# Patient Record
Sex: Female | Born: 1935 | Race: White | Hispanic: No | Smoking: Never smoker
Health system: Southern US, Community
[De-identification: ages and names within clinical notes are randomized; demographics above are authoritative.]

## PROBLEM LIST (undated history)

## (undated) DIAGNOSIS — I10 Essential (primary) hypertension: Secondary | ICD-10-CM

## (undated) DIAGNOSIS — S62109A Fracture of unspecified carpal bone, unspecified wrist, initial encounter for closed fracture: Secondary | ICD-10-CM

## (undated) DIAGNOSIS — B019 Varicella without complication: Secondary | ICD-10-CM

## (undated) DIAGNOSIS — K589 Irritable bowel syndrome without diarrhea: Secondary | ICD-10-CM

## (undated) DIAGNOSIS — D62 Acute posthemorrhagic anemia: Secondary | ICD-10-CM

## (undated) DIAGNOSIS — E1142 Type 2 diabetes mellitus with diabetic polyneuropathy: Secondary | ICD-10-CM

## (undated) DIAGNOSIS — R634 Abnormal weight loss: Secondary | ICD-10-CM

## (undated) DIAGNOSIS — R531 Weakness: Secondary | ICD-10-CM

## (undated) DIAGNOSIS — F329 Major depressive disorder, single episode, unspecified: Secondary | ICD-10-CM

## (undated) DIAGNOSIS — K649 Unspecified hemorrhoids: Secondary | ICD-10-CM

## (undated) DIAGNOSIS — K5900 Constipation, unspecified: Secondary | ICD-10-CM

## (undated) DIAGNOSIS — H5461 Unqualified visual loss, right eye, normal vision left eye: Secondary | ICD-10-CM

## (undated) DIAGNOSIS — R1310 Dysphagia, unspecified: Secondary | ICD-10-CM

## (undated) DIAGNOSIS — I2510 Atherosclerotic heart disease of native coronary artery without angina pectoris: Secondary | ICD-10-CM

## (undated) DIAGNOSIS — L309 Dermatitis, unspecified: Secondary | ICD-10-CM

## (undated) DIAGNOSIS — M948X9 Other specified disorders of cartilage, unspecified sites: Secondary | ICD-10-CM

## (undated) DIAGNOSIS — B952 Enterococcus as the cause of diseases classified elsewhere: Secondary | ICD-10-CM

## (undated) DIAGNOSIS — R42 Dizziness and giddiness: Secondary | ICD-10-CM

## (undated) DIAGNOSIS — S72142A Displaced intertrochanteric fracture of left femur, initial encounter for closed fracture: Secondary | ICD-10-CM

## (undated) DIAGNOSIS — E785 Hyperlipidemia, unspecified: Secondary | ICD-10-CM

## (undated) DIAGNOSIS — E440 Moderate protein-calorie malnutrition: Secondary | ICD-10-CM

## (undated) DIAGNOSIS — R2681 Unsteadiness on feet: Secondary | ICD-10-CM

## (undated) DIAGNOSIS — D649 Anemia, unspecified: Principal | ICD-10-CM

## (undated) DIAGNOSIS — N390 Urinary tract infection, site not specified: Secondary | ICD-10-CM

## (undated) HISTORY — DX: Essential (primary) hypertension: I10

## (undated) HISTORY — DX: Unsteadiness on feet: R26.81

## (undated) HISTORY — DX: Displaced intertrochanteric fracture of left femur, initial encounter for closed fracture: S72.142A

## (undated) HISTORY — DX: Anemia, unspecified: D64.9

## (undated) HISTORY — DX: Moderate protein-calorie malnutrition: E44.0

## (undated) HISTORY — DX: Dermatitis, unspecified: L30.9

## (undated) HISTORY — DX: Dizziness and giddiness: R42

## (undated) HISTORY — DX: Abnormal weight loss: R63.4

## (undated) HISTORY — DX: Constipation, unspecified: K59.00

## (undated) HISTORY — DX: Unspecified hemorrhoids: K64.9

## (undated) HISTORY — DX: Hyperlipidemia, unspecified: E78.5

## (undated) HISTORY — DX: Atherosclerotic heart disease of native coronary artery without angina pectoris: I25.10

## (undated) HISTORY — DX: Dysphagia, unspecified: R13.10

## (undated) HISTORY — DX: Weakness: R53.1

## (undated) HISTORY — DX: Acute posthemorrhagic anemia: D62

## (undated) HISTORY — DX: Urinary tract infection, site not specified: N39.0

## (undated) HISTORY — DX: Varicella without complication: B01.9

## (undated) HISTORY — DX: Major depressive disorder, single episode, unspecified: F32.9

## (undated) HISTORY — DX: Irritable bowel syndrome, unspecified: K58.9

## (undated) HISTORY — DX: Unqualified visual loss, right eye, normal vision left eye: H54.61

## (undated) HISTORY — DX: Urinary tract infection, site not specified: B95.2

## (undated) HISTORY — DX: Fracture of unspecified carpal bone, unspecified wrist, initial encounter for closed fracture: S62.109A

## (undated) HISTORY — DX: Other specified disorders of cartilage, unspecified sites: M94.8X9

---

## 1968-05-28 DIAGNOSIS — E785 Hyperlipidemia, unspecified: Secondary | ICD-10-CM

## 1968-05-28 DIAGNOSIS — I10 Essential (primary) hypertension: Secondary | ICD-10-CM

## 1968-05-28 HISTORY — DX: Hyperlipidemia, unspecified: E78.5

## 1968-05-28 HISTORY — DX: Essential (primary) hypertension: I10

## 1977-05-28 HISTORY — PX: HEMORROIDECTOMY: SUR656

## 2000-05-28 HISTORY — PX: OTHER SURGICAL HISTORY: SHX169

## 2002-05-28 HISTORY — PX: ROTATOR CUFF REPAIR: SHX139

## 2004-05-28 HISTORY — PX: OTHER SURGICAL HISTORY: SHX169

## 2006-05-28 HISTORY — PX: ABDOMINAL HYSTERECTOMY: SHX81

## 2006-05-28 LAB — HM COLONOSCOPY

## 2008-05-28 DIAGNOSIS — R42 Dizziness and giddiness: Secondary | ICD-10-CM

## 2008-05-28 HISTORY — PX: PACEMAKER INSERTION: SHX728

## 2008-05-28 HISTORY — DX: Dizziness and giddiness: R42

## 2009-09-07 LAB — ICD DEVICE OBSERVATION

## 2010-12-27 LAB — HM MAMMOGRAPHY

## 2011-04-18 ENCOUNTER — Ambulatory Visit: Payer: Self-pay | Admitting: Internal Medicine

## 2011-07-02 ENCOUNTER — Ambulatory Visit (INDEPENDENT_AMBULATORY_CARE_PROVIDER_SITE_OTHER): Payer: Medicare Other | Admitting: Family Medicine

## 2011-07-02 ENCOUNTER — Encounter: Payer: Self-pay | Admitting: Family Medicine

## 2011-07-02 DIAGNOSIS — Z23 Encounter for immunization: Secondary | ICD-10-CM | POA: Diagnosis not present

## 2011-07-02 DIAGNOSIS — R42 Dizziness and giddiness: Secondary | ICD-10-CM

## 2011-07-02 DIAGNOSIS — E119 Type 2 diabetes mellitus without complications: Secondary | ICD-10-CM | POA: Diagnosis not present

## 2011-07-02 DIAGNOSIS — H5461 Unqualified visual loss, right eye, normal vision left eye: Secondary | ICD-10-CM | POA: Insufficient documentation

## 2011-07-02 DIAGNOSIS — H8110 Benign paroxysmal vertigo, unspecified ear: Secondary | ICD-10-CM | POA: Insufficient documentation

## 2011-07-02 DIAGNOSIS — E785 Hyperlipidemia, unspecified: Secondary | ICD-10-CM

## 2011-07-02 DIAGNOSIS — Z0000 Encounter for general adult medical examination without abnormal findings: Secondary | ICD-10-CM

## 2011-07-02 DIAGNOSIS — K589 Irritable bowel syndrome without diarrhea: Secondary | ICD-10-CM

## 2011-07-02 DIAGNOSIS — M948X9 Other specified disorders of cartilage, unspecified sites: Secondary | ICD-10-CM

## 2011-07-02 DIAGNOSIS — M810 Age-related osteoporosis without current pathological fracture: Secondary | ICD-10-CM | POA: Insufficient documentation

## 2011-07-02 DIAGNOSIS — I10 Essential (primary) hypertension: Secondary | ICD-10-CM

## 2011-07-02 DIAGNOSIS — I2510 Atherosclerotic heart disease of native coronary artery without angina pectoris: Secondary | ICD-10-CM

## 2011-07-02 DIAGNOSIS — R109 Unspecified abdominal pain: Secondary | ICD-10-CM

## 2011-07-02 DIAGNOSIS — K649 Unspecified hemorrhoids: Secondary | ICD-10-CM

## 2011-07-02 HISTORY — DX: Unspecified hemorrhoids: K64.9

## 2011-07-02 HISTORY — DX: Atherosclerotic heart disease of native coronary artery without angina pectoris: I25.10

## 2011-07-02 LAB — HEPATIC FUNCTION PANEL
ALT: 11 U/L (ref 0–35)
Albumin: 3.8 g/dL (ref 3.5–5.2)
Alkaline Phosphatase: 63 U/L (ref 39–117)
Bilirubin, Direct: 0.1 mg/dL (ref 0.0–0.3)
Total Protein: 6.7 g/dL (ref 6.0–8.3)

## 2011-07-02 LAB — CBC
MCHC: 33.2 g/dL (ref 30.0–36.0)
MCV: 87.4 fl (ref 78.0–100.0)
Platelets: 316 10*3/uL (ref 150.0–400.0)

## 2011-07-02 LAB — RENAL FUNCTION PANEL
Albumin: 3.8 g/dL (ref 3.5–5.2)
BUN: 21 mg/dL (ref 6–23)
Chloride: 105 mEq/L (ref 96–112)
Creatinine, Ser: 1.3 mg/dL — ABNORMAL HIGH (ref 0.4–1.2)
GFR: 43.5 mL/min — ABNORMAL LOW (ref >60.00)
Glucose, Bld: 96 mg/dL (ref 70–99)
Phosphorus: 3.3 mg/dL (ref 2.3–4.6)

## 2011-07-02 LAB — POCT URINALYSIS DIPSTICK
Bilirubin, UA: NEGATIVE
Ketones, UA: NEGATIVE
Nitrite, UA: NEGATIVE
Spec Grav, UA: 1.01
pH, UA: 5.5

## 2011-07-02 LAB — HEMOGLOBIN A1C: Hgb A1c MFr Bld: 6.5 % (ref 4.6–6.5)

## 2011-07-02 LAB — LIPID PANEL
Cholesterol: 202 mg/dL — ABNORMAL HIGH (ref 0–200)
VLDL: 25 mg/dL (ref 0.0–40.0)

## 2011-07-02 NOTE — Assessment & Plan Note (Signed)
Patient with a pacemaker but recently relocated to the area, neither she nor her daughter are aware of why she received it, will check old records from PMD and cardiology in [place] to further evaluate and refer her to cardiology for ongoing management of her pace maker.

## 2011-07-02 NOTE — Assessment & Plan Note (Signed)
Has had trouble with her right knee in particular but is not having any significant discomfort at this time

## 2011-07-02 NOTE — Progress Notes (Signed)
Patient ID: Briana Thornton, female   DOB: 1926/12/14, 78 y.o.   MRN: [id number] Briana Thornton [id number] Oct 03, 1926 07/02/2011      Progress Note New Patient  Subjective  Chief Complaint  Chief Complaint  Patient presents with  . Establish Care    new patient    HPI  Patient is an 78 year old Caucasian female who is in today with her daughter after relocating from [place]. She is recently widowed and is struggling with some grieving but denies being overwhelmed by her depression or anxiety. Overall she is in a good state of health. She had no recent febrile illness, chest pain, palpitations, shortness of breath, GI or GU complaints. She has not been checking her blood sugars as her glucometer is not working but denies any polyuria or polydipsia. She had some bad vertigo years ago is having no difficulty with that at the present time.  Past Medical History  Diagnosis Date  . Chicken pox as a child  . Measles as a child  . IBS (irritable bowel syndrome)   . Diabetes mellitus 47    type 2  . Hyperlipidemia 72  . Hypertension 72  . Vision loss of right eye   . Vertigo 2010    benign  . Calcification of cartilage     ear  . Cardiovascular system disease 07/02/2011  . Hemorrhoid 07/02/2011  . Osteoporosis 07/02/2011    Past Surgical History  Procedure Date  . Hemorroidectomy 1979  . Knee scoped 2002    right  . Rotator cuff repair 2004    right  . Abdominal hysterectomy 2008    partial still has ovaries  . Lens implant left 2006  . Pacemaker insertion 2010    Family History  Problem Relation Age of Onset  . Diabetes Mother     type 2  . Hypertension Mother   . Cancer Brother     lung-smoker  . Diabetes Daughter     pre diabetic  . Diabetes Son     type 2  . Cancer Maternal Grandfather     prostate    History   Social History  . Marital Status: Widowed    Spouse Name: N/A    Number of Children: N/A  . Years of Education: N/A   Occupational History  . Not on  file.   Social History Main Topics  . Smoking status: Never Smoker   . Smokeless tobacco: Never Used  . Alcohol Use: No  . Drug Use: No  . Sexually Active: Not on file   Other Topics Concern  . Not on file   Social History Narrative  . No narrative on file    No current outpatient prescriptions on file prior to visit.    No Known Allergies  Review of Systems  Review of Systems  Constitutional: Negative for fever, chills and malaise/fatigue.  HENT: Negative for hearing loss, nosebleeds and congestion.   Eyes: Negative for discharge.  Respiratory: Negative for cough, sputum production, shortness of breath and wheezing.   Cardiovascular: Negative for chest pain, palpitations and leg swelling.  Gastrointestinal: Negative for heartburn, nausea, vomiting, abdominal pain, diarrhea, constipation and blood in stool.  Genitourinary: Negative for dysuria, urgency, frequency and hematuria.  Musculoskeletal: Negative for myalgias, back pain and falls.  Skin: Negative for rash.  Neurological: Negative for dizziness, tremors, sensory change, focal weakness, loss of consciousness, weakness and headaches.  Endo/Heme/Allergies: Negative for polydipsia. Does not bruise/bleed easily.  Psychiatric/Behavioral: Negative for depression and suicidal ideas.  The patient is not nervous/anxious and does not have insomnia.        Grieving the loss of her husband of many years in March of 2012.    Objective  BP 141/81  Pulse 74  Temp(Src) 97.8 F (36.6 C) (Temporal)  Ht 5' 0.25" (1.53 m)  Wt 135 lb (61.236 kg)  BMI 26.15 kg/m2  SpO2 97%  Physical Exam  Physical Exam  Constitutional: She is oriented to person, place, and time and well-developed, well-nourished, and in no distress. No distress.  HENT:  Head: Normocephalic and atraumatic.  Right Ear: External ear normal.  Left Ear: External ear normal.  Nose: Nose normal.  Mouth/Throat: Oropharynx is clear and moist. No oropharyngeal  exudate.  Eyes: Conjunctivae are normal. Pupils are equal, round, and reactive to light. Right eye exhibits no discharge. Left eye exhibits no discharge. No scleral icterus.  Neck: Normal range of motion. Neck supple. No thyromegaly present.  Cardiovascular: Normal rate, regular rhythm, normal heart sounds and intact distal pulses.   No murmur heard. Pulmonary/Chest: Effort normal and breath sounds normal. No respiratory distress. She has no wheezes. She has no rales.  Abdominal: Soft. Bowel sounds are normal. She exhibits no distension and no mass. There is no tenderness.  Musculoskeletal: Normal range of motion. She exhibits no edema and no tenderness.  Lymphadenopathy:    She has no cervical adenopathy.  Neurological: She is alert and oriented to person, place, and time. She has normal reflexes. No cranial nerve deficit. Coordination normal.  Skin: Skin is warm and dry. No rash noted. She is not diaphoretic.       Scaly patch with erythematous base on right side of neck c/w recenttly removed seborrhea which patient acknowledges picking at  Psychiatric: Mood, memory and affect normal.       Assessment & Plan  Diabetes mellitus In need of a new glucometer today, Freestyle Lite, should check her blood fasting daily and prn as needed for any symptoms. Avoid simple carbs, continue Metformin bid for now, check hgba1c, microalbumin  IBS (irritable bowel syndrome) Long history of intermittent symptoms, generally well controlled on current diet. Encouraged a daily probiotic  Calcification of cartilage Has had trouble with her right knee in particular but is not having any significant discomfort at this time  Vertigo Had one bad patch and it has not recurred. She ultimately required PT to learn the maneuvers to control it. Encouraged good hydration and report any concerning symptoms  Hyperlipidemia Has been out of her Atorvastatin for a month now, will check fasting labs today to further  evaluate before represcribing this medication, avoid trans fats  Hypertension Mildly elevated, no change in medications  Cardiovascular system disease Patient with a pacemaker but recently relocated to the area, neither she nor her daughter are aware of why she received it, will check old records from PMD and cardiology in [place] to further evaluate and refer her to cardiology for ongoing management of her pace maker.  Osteoporosis Continue calcium and vitamin D supplementation, request old records to review bone density, she reports she has been advised to start Fosamax in the past but chose not to.   Hemorrhoid Have recurred but she finds them tolerable. No change in therapy at this time     Tdap given today

## 2011-07-02 NOTE — Assessment & Plan Note (Signed)
Have recurred but she finds them tolerable. No change in therapy at this time

## 2011-07-02 NOTE — Assessment & Plan Note (Signed)
In need of a new glucometer today, Freestyle Lite, should check her blood fasting daily and prn as needed for any symptoms. Avoid simple carbs, continue Metformin bid for now, check hgba1c, microalbumin

## 2011-07-02 NOTE — Assessment & Plan Note (Signed)
Had one bad patch and it has not recurred. She ultimately required PT to learn the maneuvers to control it. Encouraged good hydration and report any concerning symptoms

## 2011-07-02 NOTE — Assessment & Plan Note (Signed)
Has been out of her Atorvastatin for a month now, will check fasting labs today to further evaluate before represcribing this medication, avoid trans fats

## 2011-07-02 NOTE — Patient Instructions (Signed)

## 2011-07-02 NOTE — Assessment & Plan Note (Signed)
Continue calcium and vitamin D supplementation, request old records to review bone density, she reports she has been advised to start Fosamax in the past but chose not to.

## 2011-07-02 NOTE — Assessment & Plan Note (Signed)
Long history of intermittent symptoms, generally well controlled on current diet. Encouraged a daily probiotic

## 2011-07-02 NOTE — Assessment & Plan Note (Signed)
Mildly elevated, no change in medications

## 2011-07-03 LAB — MICROALBUMIN, URINE: Microalb, Ur: 0.5 mg/dL (ref 0.00–1.89)

## 2011-07-03 MED ORDER — ATORVASTATIN CALCIUM 10 MG PO TABS: 10.0000 mg | ORAL_TABLET | Freq: Every day | ORAL | Status: DC

## 2011-07-03 MED ORDER — METFORMIN HCL ER 500 MG PO TB24: 500.0000 mg | ORAL_TABLET | Freq: Every day | ORAL | Status: DC

## 2011-07-31 ENCOUNTER — Ambulatory Visit (INDEPENDENT_AMBULATORY_CARE_PROVIDER_SITE_OTHER): Payer: Medicare Other | Admitting: Family Medicine

## 2011-07-31 ENCOUNTER — Encounter: Payer: Self-pay | Admitting: Family Medicine

## 2011-07-31 DIAGNOSIS — I10 Essential (primary) hypertension: Secondary | ICD-10-CM | POA: Diagnosis not present

## 2011-07-31 DIAGNOSIS — Z950 Presence of cardiac pacemaker: Secondary | ICD-10-CM

## 2011-07-31 DIAGNOSIS — R634 Abnormal weight loss: Secondary | ICD-10-CM

## 2011-07-31 DIAGNOSIS — E785 Hyperlipidemia, unspecified: Secondary | ICD-10-CM

## 2011-07-31 DIAGNOSIS — E119 Type 2 diabetes mellitus without complications: Secondary | ICD-10-CM

## 2011-07-31 DIAGNOSIS — I2510 Atherosclerotic heart disease of native coronary artery without angina pectoris: Secondary | ICD-10-CM

## 2011-07-31 HISTORY — DX: Abnormal weight loss: R63.4

## 2011-07-31 NOTE — Patient Instructions (Signed)

## 2011-07-31 NOTE — Assessment & Plan Note (Signed)
Fasting sugar 104 this am, this is not an unusual number. No change in meds

## 2011-07-31 NOTE — Assessment & Plan Note (Signed)
Adequately controlled, no change to meds today

## 2011-07-31 NOTE — Assessment & Plan Note (Signed)
Tolerating Atorvastatin and takes it in am. Had missed some doses prior to last visit. They agree to try to take it regularly and we will reevaluate at next visit.

## 2011-07-31 NOTE — Assessment & Plan Note (Signed)
Very slight since last visit. Encouraged increased protein, po intake and fluids, likely multifactorial but partially caused by grief, will continue to monitor

## 2011-07-31 NOTE — Assessment & Plan Note (Signed)
Patient with long cardiac history and a pacemaker in place. We have received old records and will now refer her to cardiology for further management of her cardiac conditions.

## 2011-07-31 NOTE — Progress Notes (Signed)
Patient ID: Briana Thornton, female   DOB: 1927-03-30, 78 y.o.   MRN: [id number] Briana Thornton [id number] 01-01-1927 07/31/2011      Progress Note-Follow Up  Subjective  Chief Complaint  Chief Complaint  Patient presents with  . Follow-up    to include foot exam    HPI  Patient is a 78 year old Caucasian female in today with her daughter for followup. She has generally been doing well and trying to adjust to her move to [place] from [place]. She was widowed a year ago this Friday technologist still feeling blue and sad about this. She has had some episodes of feeling somewhat woozy and dizzy when she turns her head quickly from a holds her gaze to one side or the other for too long. She has not had any severe episodes but does have a history of vertigo for this always makes her nervous. She does note a slow worsening of her vision and has had a lens replacement left eye. She is not established an eye doctor yet to do so. Since her last visit she's not had any acute illness, chest pain palpitations, shortness of breath, GI or GU complaints. Her records have arrived from [place]. She continues to show with a low appetite and acknowledges that depression may play a part, she does not drink adequate fluids each day  Past Medical History  Diagnosis Date  . Chicken pox as a child  . Measles as a child  . IBS (irritable bowel syndrome)   . Diabetes mellitus 78    type 2  . Hyperlipidemia 78  . Hypertension 78  . Vision loss of right eye   . Vertigo 2010    benign  . Calcification of cartilage     ear  . Cardiovascular system disease 78/08/2011  . Hemorrhoid 78/08/2011  . Osteoporosis 78/08/2011    Past Surgical History  Procedure Date  . Hemorroidectomy 1979  . Knee scoped 2002    right  . Rotator cuff repair 2004    right  . Abdominal hysterectomy 2008    partial still has ovaries  . Lens implant left 2006  . Pacemaker insertion 2010    Family History  Problem Relation Age of  Onset  . Diabetes Mother     type 2  . Hypertension Mother   . Cancer Brother     lung-smoker  . Diabetes Daughter     pre diabetic  . Diabetes Son     type 2  . Cancer Maternal Grandfather     prostate    History   Social History  . Marital Status: Widowed    Spouse Name: N/A    Number of Children: N/A  . Years of Education: N/A   Occupational History  . Not on file.   Social History Main Topics  . Smoking status: Never Smoker   . Smokeless tobacco: Never Used  . Alcohol Use: No  . Drug Use: No  . Sexually Active: Not on file   Other Topics Concern  . Not on file   Social History Narrative  . No narrative on file    Current Outpatient Prescriptions on File Prior to Visit  Medication Sig Dispense Refill  . atorvastatin (LIPITOR) 10 MG tablet Take 1 tablet (10 mg total) by mouth daily.  90 tablet  3  . metFORMIN (GLUCOPHAGE-XR) 500 MG 24 hr tablet Take 1 tablet (500 mg total) by mouth daily with breakfast.  90 tablet  3  . valsartan-hydrochlorothiazide (DIOVAN-HCT)  160-12.5 MG per tablet Take 1 tablet by mouth daily.        No Known Allergies  Review of Systems  Review of Systems  Constitutional: Negative for fever and malaise/fatigue.  HENT: Negative for congestion.   Eyes: Negative for discharge.  Respiratory: Negative for shortness of breath.   Cardiovascular: Negative for chest pain, palpitations and leg swelling.  Gastrointestinal: Negative for nausea, abdominal pain and diarrhea.  Genitourinary: Negative for dysuria.  Musculoskeletal: Negative for falls.  Skin: Negative for rash.  Neurological: Positive for dizziness. Negative for loss of consciousness and headaches.  Endo/Heme/Allergies: Negative for polydipsia.  Psychiatric/Behavioral: Negative for depression and suicidal ideas. The patient is not nervous/anxious and does not have insomnia.     Objective  BP 118/62  Pulse 72  Temp(Src) 98.6 F (37 C) (Temporal)  Ht 5' 0.25" (1.53 m)  Wt  134 lb 6.4 oz (60.963 kg)  BMI 26.03 kg/m2  SpO2 97%  Physical Exam Physical Exam  Constitutional: She is oriented to person, place, and time and well-developed, well-nourished, and in no distress. No distress.  HENT:  Head: Normocephalic and atraumatic.  Eyes: Conjunctivae are normal.  Neck: Neck supple. No thyromegaly present.  Cardiovascular: Normal rate, regular rhythm and normal heart sounds.   No murmur heard. Pulmonary/Chest: Effort normal and breath sounds normal. She has no wheezes.  Abdominal: She exhibits no distension and no mass.  Musculoskeletal: She exhibits no edema.  Lymphadenopathy:    She has no cervical adenopathy.  Neurological: She is alert and oriented to person, place, and time.  Skin: Skin is warm and dry. No rash noted. She is not diaphoretic.  Psychiatric: Memory, affect and judgment normal.    Lab Results  Component Value Date   TSH 1.55 07/02/2011   Lab Results  Component Value Date   WBC 7.6 07/02/2011   HGB 11.9* 07/02/2011   HCT 35.7* 07/02/2011   MCV 87.4 07/02/2011   PLT 316.0 07/02/2011   Lab Results  Component Value Date   CREATININE 1.3* 07/02/2011   BUN 21 07/02/2011   NA 141 07/02/2011   K 3.8 07/02/2011   CL 105 07/02/2011   CO2 29 07/02/2011   Lab Results  Component Value Date   ALT 11 07/02/2011   AST 18 07/02/2011   ALKPHOS 63 07/02/2011   BILITOT 0.9 07/02/2011   Lab Results  Component Value Date   CHOL 202* 07/02/2011   Lab Results  Component Value Date   HDL 52.90 07/02/2011   No results found for this basename: [facility]   Lab Results  Component Value Date   TRIG 125.0 07/02/2011   Lab Results  Component Value Date   CHOLHDL 4 07/02/2011     Assessment & Plan  Cardiovascular system disease Patient with long cardiac history and a pacemaker in place. We have received old records and will now refer her to cardiology for further management of her cardiac conditions.  Diabetes mellitus Fasting sugar 104 this am, this is not an unusual  number. No change in meds  Hypertension Adequately controlled, no change to meds today  Hyperlipidemia Tolerating Atorvastatin and takes it in am. Had missed some doses prior to last visit. They agree to try to take it regularly and we will reevaluate at next visit.

## 2011-08-17 ENCOUNTER — Other Ambulatory Visit: Payer: Self-pay | Admitting: *Deleted

## 2011-08-17 DIAGNOSIS — I10 Essential (primary) hypertension: Secondary | ICD-10-CM

## 2011-08-17 MED ORDER — VALSARTAN-HYDROCHLOROTHIAZIDE 160-12.5 MG PO TABS: 1.0000 | ORAL_TABLET | Freq: Every day | ORAL | Status: DC

## 2011-08-17 NOTE — Telephone Encounter (Signed)
Pt daughter left vm stating that pt medication refill needed to be sent to [facility] for Diovan HCTZ and metformin, noted last OV 07-31-11, spoke to daughter Gershon Crane to advised that the Metformin was sent on 07-03-11 with 3 refills, pt daughter advised that was correct and she forgot they picked that up but only needs to have the refill for the Diovan HCT, advised that the medication is sent to [facility] with a 90 day supply, pt daughter understood. rx sent to pharmacy by e-script

## 2011-08-20 ENCOUNTER — Other Ambulatory Visit: Payer: Self-pay | Admitting: Family Medicine

## 2011-08-21 NOTE — Telephone Encounter (Addendum)
Pt needs Diovan, HCTZ, and Metformin [facility] in [place]. Last seen 07/31/11.  RX for Metformin called in on 07/03/11.  Diovan called on 3/22 by Tresa Moore.  [facility], they have received both for 90 day supply and pt picked up partial fill of Diovan and will pick up remainder when available from pharmacy. No further action to be taken.

## 2011-08-31 ENCOUNTER — Encounter: Payer: Self-pay | Admitting: Family Medicine

## 2011-08-31 ENCOUNTER — Ambulatory Visit (INDEPENDENT_AMBULATORY_CARE_PROVIDER_SITE_OTHER): Payer: Medicare Other | Admitting: Family Medicine

## 2011-08-31 VITALS — BP 110/71 | HR 74 | Temp 99.4°F | Ht 60.25 in | Wt 133.0 lb

## 2011-08-31 DIAGNOSIS — J069 Acute upper respiratory infection, unspecified: Secondary | ICD-10-CM | POA: Insufficient documentation

## 2011-08-31 DIAGNOSIS — R599 Enlarged lymph nodes, unspecified: Secondary | ICD-10-CM | POA: Insufficient documentation

## 2011-08-31 MED ORDER — AZITHROMYCIN 250 MG PO TABS: ORAL_TABLET | ORAL | Status: DC

## 2011-08-31 NOTE — Assessment & Plan Note (Signed)
Left jugulodigastric region: measures about 2-3 cm X 2-3 cm today and pt says this doesn't feel much different from the past. Monitor for growth, no w/u at this time.

## 2011-08-31 NOTE — Assessment & Plan Note (Signed)
Question viral vs acute bacterial rhinosinusitis. No sign of bronchitis or pneumonia. Z-pack rx'd today. Continue robitussin DM or mucinex DM prn.  She doesn't tolerate antihistamines well. Nasal saline irrigation discussed.

## 2011-08-31 NOTE — Progress Notes (Signed)
Addended by: Andrew Au on: 08/31/2011 10:35 AM   Modules accepted: Level of Service

## 2011-08-31 NOTE — Progress Notes (Addendum)
OFFICE NOTE  08/31/2011  CC:  Chief Complaint  Patient presents with  . Nasal Congestion    x 1 week, cough, no fever     HPI: Patient is a 78 y.o. Caucasian female who is here with her daughter for nasal congestion. Pt presents complaining of respiratory symptoms for 7 days.  Describes "double sickening".  Mostly nasal congestion/runny nose, sneezing, and PND cough.  Worst symptoms seems to be the facial/nose congestion.  Lately the symptoms seem to be worsening. No fevers, no wheezing, and no SOB.  No pain in face or teeth.  No significant HA.  ST mild at most.  Symptoms made worse by talking.  Symptoms improved by nothing.  Robitussin DM no help.  Doesn't tolerate antihistamines well. Smoker? no Muscle or joint aches? no Flu shot this season at least 2 wks ago? yes  ROS: no n/v/d or abdominal pain.  No rash.  No neck stiffness.   +Mild fatigue and poor po intake but this apparently is not new.  Pertinent PMH:  Past Medical History  Diagnosis Date  . Chicken pox as a child  . Measles as a child  . IBS (irritable bowel syndrome)   . Diabetes mellitus 47    type 2  . Hyperlipidemia 72  . Hypertension 72  . Vision loss of right eye   . Vertigo 2010    benign  . Calcification of cartilage     ear  . Cardiovascular system disease 07/02/2011  . Hemorrhoid 07/02/2011  . Osteoporosis 07/02/2011  . Weight loss 07/31/2011    MEDS:  Outpatient Prescriptions Prior to Visit  Medication Sig Dispense Refill  . atorvastatin (LIPITOR) 10 MG tablet Take 1 tablet (10 mg total) by mouth daily.  90 tablet  3  . metFORMIN (GLUCOPHAGE-XR) 500 MG 24 hr tablet Take 1 tablet (500 mg total) by mouth daily with breakfast.  90 tablet  3  . valsartan-hydrochlorothiazide (DIOVAN-HCT) 160-12.5 MG per tablet Take 1 tablet by mouth daily.  90 tablet  0    PE: Blood pressure 110/71, pulse 74, temperature 99.4 F (37.4 C), temperature source Temporal, height 5' 0.25" (1.53 m), weight 133 lb (60.328 kg),  SpO2 97.00%. VS: noted--normal. Gen: alert, NAD, NONTOXIC APPEARING. HEENT: eyes without injection, drainage, or swelling.  Ears: EACs clear, TMs with normal light reflex and landmarks.  Nose: Clear rhinorrhea, with some dried, crusty exudate adherent to mildly injected mucosa.  No purulent d/c.  No paranasal sinus TTP.  No facial swelling.  Throat and mouth without focal lesion.  No pharyngial swelling, erythema, or exudate.   Neck: supple.  No tenderness.  She has a soft 2-3 cm X 2-3 cm mobile lymph node palpable on left jugulodigastric region.  She says this was there PRIOR to her current URI and she and Dr. Abner Greenspan were keeping an eye on it. LUNGS: CTA bilat, nonlabored resps.   CV: RRR, no m/r/g. EXT: no c/c/e SKIN: no rash  LAB: none  IMPRESSION AND PLAN:  URI (upper respiratory infection) Question viral vs acute bacterial rhinosinusitis. No sign of bronchitis or pneumonia. Z-pack rx'd today. Continue robitussin DM or mucinex DM prn.  She doesn't tolerate antihistamines well. Nasal saline irrigation discussed.  Lymph node enlargement Left jugulodigastric region: measures about 2-3 cm X 2-3 cm today and pt says this doesn't feel much different from the past. Monitor for growth, no w/u at this time.      FOLLOW UP:  prn

## 2011-09-18 ENCOUNTER — Ambulatory Visit (INDEPENDENT_AMBULATORY_CARE_PROVIDER_SITE_OTHER): Payer: Medicare Other | Admitting: *Deleted

## 2011-09-18 ENCOUNTER — Encounter: Payer: Self-pay | Admitting: Internal Medicine

## 2011-09-18 ENCOUNTER — Ambulatory Visit (INDEPENDENT_AMBULATORY_CARE_PROVIDER_SITE_OTHER): Payer: Medicare Other | Admitting: Internal Medicine

## 2011-09-18 VITALS — BP 126/54 | HR 63 | Ht 60.0 in | Wt 132.1 lb

## 2011-09-18 DIAGNOSIS — I495 Sick sinus syndrome: Secondary | ICD-10-CM

## 2011-09-18 DIAGNOSIS — I10 Essential (primary) hypertension: Secondary | ICD-10-CM | POA: Diagnosis not present

## 2011-09-18 DIAGNOSIS — E785 Hyperlipidemia, unspecified: Secondary | ICD-10-CM

## 2011-09-18 DIAGNOSIS — Z950 Presence of cardiac pacemaker: Secondary | ICD-10-CM

## 2011-09-18 LAB — PACEMAKER DEVICE OBSERVATION
AL AMPLITUDE: 2 mv
AL IMPEDENCE PM: 450 Ohm
BAMS-0003: 60 {beats}/min
BATTERY VOLTAGE: 2.9478 V
RV LEAD AMPLITUDE: 10 mv
VENTRICULAR PACING PM: 99

## 2011-09-18 NOTE — Patient Instructions (Signed)
Your physician wants you to follow-up in: 12 months with Dr Taylor You will receive a reminder letter in the mail two months in advance. If you don't receive a letter, please call our office to schedule the follow-up appointment.   Remote monitoring is used to monitor your Pacemaker of ICD from home. This monitoring reduces the number of office visits required to check your device to one time per year. It allows us to keep an eye on the functioning of your device to ensure it is working properly. You are scheduled for a device check from home on 12/20/11. You may send your transmission at any time that day. If you have a wireless device, the transmission will be sent automatically. After your physician reviews your transmission, you will receive a postcard with your next transmission date.   

## 2011-09-18 NOTE — Assessment & Plan Note (Signed)
Her device is working normally. We'll plan to recheck in several months. 

## 2011-09-18 NOTE — Assessment & Plan Note (Signed)
She is instructed to continue her medical therapy and maintain a low-fat diet.

## 2011-09-18 NOTE — Progress Notes (Signed)
HPI  [name] is referred today for ongoing evaluation and management of her pacemaker and hypertension. The patient is a very pleasant elderly woman with diabetes and hypertension who developed symptomatic bradycardia several years ago. She underwent insertion of a dual-chamber pacemaker in June of 2010. She immediately improved. Since then, she has moved to [place] to be closer to her family. She has had no syncope. She is fairly sedentary noting that she has trouble with her balance and is at risk to fall. She admits to some weakness. I she is not dieting but continues to slowly lose weight. She denies pain per se but does have mild dyspnea with exertion.  No Known Allergies   Current Outpatient Prescriptions  Medication Sig Dispense Refill  . atorvastatin (LIPITOR) 10 MG tablet Take 1 tablet (10 mg total) by mouth daily.  90 tablet  3  . metFORMIN (GLUCOPHAGE-XR) 500 MG 24 hr tablet Take 1 tablet (500 mg total) by mouth daily with breakfast.  90 tablet  3  . valsartan-hydrochlorothiazide (DIOVAN-HCT) 160-12.5 MG per tablet Take 1 tablet by mouth daily.  90 tablet  0     Past Medical History  Diagnosis Date  . Chicken pox as a child  . Measles as a child  . IBS (irritable bowel syndrome)   . Diabetes mellitus 47    type 2  . Hyperlipidemia 72  . Hypertension 72  . Vision loss of right eye   . Vertigo 2010    benign  . Calcification of cartilage     ear  . Cardiovascular system disease 07/02/2011  . Hemorrhoid 07/02/2011  . Osteoporosis 07/02/2011  . Weight loss 07/31/2011    ROS:   All systems reviewed and negative except as noted in the HPI.   Past Surgical History  Procedure Date  . Hemorroidectomy 1979  . Knee scoped 2002    right  . Rotator cuff repair 2004    right  . Abdominal hysterectomy 2008    partial still has ovaries  . Lens implant left 2006  . Pacemaker insertion 2010     Family History  Problem Relation Age of Onset  . Diabetes Mother     type 2   . Hypertension Mother   . Cancer Brother     lung-smoker  . Diabetes Daughter     pre diabetic  . Diabetes Son     type 2  . Cancer Maternal Grandfather     prostate     History   Social History  . Marital Status: Widowed    Spouse Name: N/A    Number of Children: N/A  . Years of Education: N/A   Occupational History  . Not on file.   Social History Main Topics  . Smoking status: Never Smoker   . Smokeless tobacco: Never Used  . Alcohol Use: No  . Drug Use: No  . Sexually Active: Not on file   Other Topics Concern  . Not on file   Social History Narrative  . No narrative on file     BP 126/54  Pulse 63  Ht 5' (1.524 m)  Wt 132 lb 1.9 oz (59.929 kg)  BMI 25.80 kg/m2  Physical Exam:  Well appearing elderly woman, NAD HEENT: Unremarkable Neck:  No JVD, no thyromegally Lungs:  Clear with no wheezes, rales, or rhonchi. Well-healed pacemaker incision. HEART:  Regular rate rhythm, no murmurs, no rubs, no clicks Abd:  soft, positive bowel sounds, no organomegally, no rebound, no  guarding Ext:  2 plus pulses, no edema, no cyanosis, no clubbing Skin:  No rashes no nodules Neuro:  CN II through XII intact, motor grossly intact  DEVICE  Normal device function.  See PaceArt for details.   Assess/Plan:

## 2011-09-18 NOTE — Assessment & Plan Note (Signed)
Her blood pressure appears to be well-controlled. She will continue her current medications and maintain a low-sodium diet. 

## 2011-09-18 NOTE — Progress Notes (Signed)
Pacer check in clinic  

## 2011-10-20 ENCOUNTER — Emergency Department (HOSPITAL_COMMUNITY): Payer: Medicare Other

## 2011-10-20 ENCOUNTER — Emergency Department (HOSPITAL_COMMUNITY)
Admission: EM | Admit: 2011-10-20 | Discharge: 2011-10-20 | Disposition: A | Discharge status: Home Health | Payer: Medicare Other | Attending: Emergency Medicine | Admitting: Emergency Medicine

## 2011-10-20 ENCOUNTER — Encounter (HOSPITAL_COMMUNITY): Payer: Self-pay | Admitting: Emergency Medicine

## 2011-10-20 DIAGNOSIS — E785 Hyperlipidemia, unspecified: Secondary | ICD-10-CM | POA: Insufficient documentation

## 2011-10-20 DIAGNOSIS — M810 Age-related osteoporosis without current pathological fracture: Secondary | ICD-10-CM | POA: Diagnosis not present

## 2011-10-20 DIAGNOSIS — R42 Dizziness and giddiness: Secondary | ICD-10-CM | POA: Insufficient documentation

## 2011-10-20 DIAGNOSIS — K589 Irritable bowel syndrome without diarrhea: Secondary | ICD-10-CM | POA: Insufficient documentation

## 2011-10-20 DIAGNOSIS — I10 Essential (primary) hypertension: Secondary | ICD-10-CM | POA: Diagnosis not present

## 2011-10-20 DIAGNOSIS — IMO0001 Reserved for inherently not codable concepts without codable children: Secondary | ICD-10-CM | POA: Insufficient documentation

## 2011-10-20 DIAGNOSIS — S52539A Colles' fracture of unspecified radius, initial encounter for closed fracture: Secondary | ICD-10-CM | POA: Insufficient documentation

## 2011-10-20 DIAGNOSIS — E119 Type 2 diabetes mellitus without complications: Secondary | ICD-10-CM | POA: Insufficient documentation

## 2011-10-20 DIAGNOSIS — S5292XA Unspecified fracture of left forearm, initial encounter for closed fracture: Secondary | ICD-10-CM

## 2011-10-20 DIAGNOSIS — S52599A Other fractures of lower end of unspecified radius, initial encounter for closed fracture: Secondary | ICD-10-CM | POA: Diagnosis not present

## 2011-10-20 DIAGNOSIS — K649 Unspecified hemorrhoids: Secondary | ICD-10-CM | POA: Diagnosis not present

## 2011-10-20 DIAGNOSIS — Z79899 Other long term (current) drug therapy: Secondary | ICD-10-CM | POA: Diagnosis not present

## 2011-10-20 DIAGNOSIS — W010XXA Fall on same level from slipping, tripping and stumbling without subsequent striking against object, initial encounter: Secondary | ICD-10-CM | POA: Insufficient documentation

## 2011-10-20 DIAGNOSIS — Y9302 Activity, running: Secondary | ICD-10-CM | POA: Insufficient documentation

## 2011-10-20 DIAGNOSIS — M25539 Pain in unspecified wrist: Secondary | ICD-10-CM | POA: Diagnosis not present

## 2011-10-20 DIAGNOSIS — M545 Low back pain: Secondary | ICD-10-CM | POA: Diagnosis not present

## 2011-10-20 MED ORDER — HYDROCODONE-ACETAMINOPHEN 5-325 MG PO TABS: 1.0000 | ORAL_TABLET | ORAL | Status: AC | PRN

## 2011-10-20 NOTE — ED Notes (Signed)
Pt co of left wrist pain, pt visiting family whose dog knocked her down on her bottom first then flat on her back. Pt doesn't think she braced herself with her wrist but has noted swelling in left wrist. Pt also co of lower back pain.

## 2011-10-20 NOTE — ED Provider Notes (Signed)
History     CSN: [id number]  Arrival date & time 10/20/11  1410   First MD Initiated Contact with Patient 10/20/11 1458      Chief Complaint  Patient presents with  . Wrist Injury    (Consider location/radiation/quality/duration/timing/severity/associated sxs/prior treatment) HPI The patient presents several hours after a fall with persistent left wrist and lower back pain.  She notes that she was knocked over by a dog, recalls interventions, denies any LOC, confusion, hip pain since the fall.  Since that time she said pain persistently in the left wrist, worse with motion, better with ice.  The pain is throbbing, dull.  She also notes similar pain in her posterior back.  She can ambulate, though she notes that her pain is worse with ambulation. Past Medical History  Diagnosis Date  . Chicken pox as a child  . Measles as a child  . IBS (irritable bowel syndrome)   . Diabetes mellitus 47    type 2  . Hyperlipidemia 72  . Hypertension 72  . Vision loss of right eye   . Vertigo 2010    benign  . Calcification of cartilage     ear  . Cardiovascular system disease 07/02/2011  . Hemorrhoid 07/02/2011  . Osteoporosis 07/02/2011  . Weight loss 07/31/2011    Past Surgical History  Procedure Date  . Hemorroidectomy 1979  . Knee scoped 2002    right  . Rotator cuff repair 2004    right  . Abdominal hysterectomy 2008    partial still has ovaries  . Lens implant left 2006  . Pacemaker insertion 2010    Family History  Problem Relation Age of Onset  . Diabetes Mother     type 2  . Hypertension Mother   . Cancer Brother     lung-smoker  . Diabetes Daughter     pre diabetic  . Diabetes Son     type 2  . Cancer Maternal Grandfather     prostate    History  Substance Use Topics  . Smoking status: Never Smoker   . Smokeless tobacco: Never Used  . Alcohol Use: No    OB History    Grav Para Term Preterm Abortions TAB SAB Ect Mult Living                  Review of  Systems  Constitutional:       HPI  HENT:       HPI otherwise negative  Eyes: Negative.   Respiratory:       HPI, otherwise negative  Cardiovascular:       HPI, otherwise nmegative  Gastrointestinal: Negative for vomiting.  Genitourinary:       HPI, otherwise negative  Musculoskeletal:       HPI, otherwise negative  Skin: Negative.   Neurological: Negative for syncope.    Allergies  Review of patient's allergies indicates no known allergies.  Home Medications   Current Outpatient Rx  Name Route Sig Dispense Refill  . ATORVASTATIN CALCIUM 10 MG PO TABS Oral Take 1 tablet (10 mg total) by mouth daily. 90 tablet 3  . METFORMIN HCL ER 500 MG PO TB24 Oral Take 1 tablet (500 mg total) by mouth daily with breakfast. 90 tablet 3  . VALSARTAN-HYDROCHLOROTHIAZIDE 160-12.5 MG PO TABS Oral Take 1 tablet by mouth daily. 90 tablet 0    BP 145/53  Pulse 65  Temp(Src) 98.7 F (37.1 C) (Oral)  SpO2 97%  Physical Exam  Nursing note and vitals reviewed. Constitutional: She is oriented to person, place, and time. She appears well-developed and well-nourished. No distress.  HENT:  Head: Normocephalic and atraumatic.  Eyes: Conjunctivae and EOM are normal.  Cardiovascular: Normal rate and regular rhythm.   Pulmonary/Chest: Effort normal and breath sounds normal. No stridor. No respiratory distress.  Abdominal: She exhibits no distension.  Musculoskeletal: She exhibits no edema.       Back:       Arms: Neurological: She is alert and oriented to person, place, and time. No cranial nerve deficit.  Skin: Skin is warm and dry.  Psychiatric: She has a normal mood and affect.    ED Course  Procedures (including critical care time)  Labs Reviewed - No data to display No results found.   No diagnosis found.    MDM  This 78 year old female presents following a fall with left wrist pain and buttock pain.  On exam she is in no distress, though she is tenderness to palpation about the  wrist.  The patient's x-ray demonstrates a distal radius fracture.  The patient's fracture was splinted, with no complications and she she was discharged to follow up with orthopedics to  Gerhard Munch, MD 10/21/11 0020

## 2011-10-22 DIAGNOSIS — S62109A Fracture of unspecified carpal bone, unspecified wrist, initial encounter for closed fracture: Secondary | ICD-10-CM

## 2011-10-22 HISTORY — DX: Fracture of unspecified carpal bone, unspecified wrist, initial encounter for closed fracture: S62.109A

## 2011-10-23 DIAGNOSIS — S52599A Other fractures of lower end of unspecified radius, initial encounter for closed fracture: Secondary | ICD-10-CM | POA: Diagnosis not present

## 2011-10-30 DIAGNOSIS — S52599A Other fractures of lower end of unspecified radius, initial encounter for closed fracture: Secondary | ICD-10-CM | POA: Diagnosis not present

## 2011-11-05 DIAGNOSIS — X58XXXA Exposure to other specified factors, initial encounter: Secondary | ICD-10-CM | POA: Diagnosis not present

## 2011-11-05 DIAGNOSIS — S52599A Other fractures of lower end of unspecified radius, initial encounter for closed fracture: Secondary | ICD-10-CM | POA: Diagnosis not present

## 2011-11-05 HISTORY — PX: WRIST SURGERY: SHX841

## 2011-11-12 ENCOUNTER — Ambulatory Visit: Payer: Medicare Other | Admitting: Family Medicine

## 2011-11-20 DIAGNOSIS — S52599A Other fractures of lower end of unspecified radius, initial encounter for closed fracture: Secondary | ICD-10-CM | POA: Diagnosis not present

## 2011-11-26 ENCOUNTER — Other Ambulatory Visit: Payer: Self-pay

## 2011-11-26 DIAGNOSIS — I10 Essential (primary) hypertension: Secondary | ICD-10-CM

## 2011-11-26 MED ORDER — GLUCOSE BLOOD VI STRP: ORAL_STRIP | Status: DC

## 2011-11-26 MED ORDER — VALSARTAN-HYDROCHLOROTHIAZIDE 160-12.5 MG PO TABS: 1.0000 | ORAL_TABLET | Freq: Every day | ORAL | Status: DC

## 2011-12-04 DIAGNOSIS — S52599A Other fractures of lower end of unspecified radius, initial encounter for closed fracture: Secondary | ICD-10-CM | POA: Diagnosis not present

## 2011-12-17 DIAGNOSIS — S52599A Other fractures of lower end of unspecified radius, initial encounter for closed fracture: Secondary | ICD-10-CM | POA: Diagnosis not present

## 2011-12-20 ENCOUNTER — Encounter: Payer: Self-pay | Admitting: Internal Medicine

## 2011-12-20 ENCOUNTER — Ambulatory Visit (INDEPENDENT_AMBULATORY_CARE_PROVIDER_SITE_OTHER): Payer: Medicare Other | Admitting: *Deleted

## 2011-12-20 DIAGNOSIS — I495 Sick sinus syndrome: Secondary | ICD-10-CM | POA: Diagnosis not present

## 2011-12-20 DIAGNOSIS — Z950 Presence of cardiac pacemaker: Secondary | ICD-10-CM | POA: Diagnosis not present

## 2011-12-24 LAB — REMOTE PACEMAKER DEVICE
AL IMPEDENCE PM: 460 Ohm
ATRIAL PACING PM: 50
BATTERY VOLTAGE: 2.95 V
RV LEAD IMPEDENCE PM: 650 Ohm

## 2012-01-03 DIAGNOSIS — S52599A Other fractures of lower end of unspecified radius, initial encounter for closed fracture: Secondary | ICD-10-CM | POA: Diagnosis not present

## 2012-01-08 ENCOUNTER — Encounter: Payer: Self-pay | Admitting: *Deleted

## 2012-01-31 DIAGNOSIS — S52599A Other fractures of lower end of unspecified radius, initial encounter for closed fracture: Secondary | ICD-10-CM | POA: Diagnosis not present

## 2012-02-22 ENCOUNTER — Other Ambulatory Visit: Payer: Self-pay

## 2012-02-22 DIAGNOSIS — I10 Essential (primary) hypertension: Secondary | ICD-10-CM

## 2012-02-22 MED ORDER — VALSARTAN-HYDROCHLOROTHIAZIDE 160-12.5 MG PO TABS: 1.0000 | ORAL_TABLET | Freq: Every day | ORAL | Status: DC

## 2012-03-24 ENCOUNTER — Encounter: Payer: Self-pay | Admitting: *Deleted

## 2012-03-24 ENCOUNTER — Ambulatory Visit (INDEPENDENT_AMBULATORY_CARE_PROVIDER_SITE_OTHER): Payer: Medicare Other | Admitting: *Deleted

## 2012-03-24 ENCOUNTER — Encounter: Payer: Self-pay | Admitting: Internal Medicine

## 2012-03-24 DIAGNOSIS — Z950 Presence of cardiac pacemaker: Secondary | ICD-10-CM

## 2012-03-24 LAB — REMOTE PACEMAKER DEVICE
ATRIAL PACING PM: 59
BAMS-0001: 180 {beats}/min
BAMS-0003: 60 {beats}/min
DEVICE MODEL PM: 2315100

## 2012-03-25 DIAGNOSIS — Z23 Encounter for immunization: Secondary | ICD-10-CM | POA: Diagnosis not present

## 2012-03-27 ENCOUNTER — Encounter: Payer: Self-pay | Admitting: *Deleted

## 2012-04-10 DIAGNOSIS — M999 Biomechanical lesion, unspecified: Secondary | ICD-10-CM | POA: Diagnosis not present

## 2012-04-10 DIAGNOSIS — M4716 Other spondylosis with myelopathy, lumbar region: Secondary | ICD-10-CM | POA: Diagnosis not present

## 2012-04-10 DIAGNOSIS — M545 Low back pain: Secondary | ICD-10-CM | POA: Diagnosis not present

## 2012-04-10 DIAGNOSIS — M5137 Other intervertebral disc degeneration, lumbosacral region: Secondary | ICD-10-CM | POA: Diagnosis not present

## 2012-04-14 DIAGNOSIS — M545 Low back pain: Secondary | ICD-10-CM | POA: Diagnosis not present

## 2012-04-14 DIAGNOSIS — M5137 Other intervertebral disc degeneration, lumbosacral region: Secondary | ICD-10-CM | POA: Diagnosis not present

## 2012-04-14 DIAGNOSIS — M999 Biomechanical lesion, unspecified: Secondary | ICD-10-CM | POA: Diagnosis not present

## 2012-04-14 DIAGNOSIS — M4716 Other spondylosis with myelopathy, lumbar region: Secondary | ICD-10-CM | POA: Diagnosis not present

## 2012-04-15 DIAGNOSIS — M545 Low back pain: Secondary | ICD-10-CM | POA: Diagnosis not present

## 2012-04-15 DIAGNOSIS — M4716 Other spondylosis with myelopathy, lumbar region: Secondary | ICD-10-CM | POA: Diagnosis not present

## 2012-04-15 DIAGNOSIS — M5137 Other intervertebral disc degeneration, lumbosacral region: Secondary | ICD-10-CM | POA: Diagnosis not present

## 2012-04-15 DIAGNOSIS — M999 Biomechanical lesion, unspecified: Secondary | ICD-10-CM | POA: Diagnosis not present

## 2012-04-21 DIAGNOSIS — M5137 Other intervertebral disc degeneration, lumbosacral region: Secondary | ICD-10-CM | POA: Diagnosis not present

## 2012-04-21 DIAGNOSIS — M4716 Other spondylosis with myelopathy, lumbar region: Secondary | ICD-10-CM | POA: Diagnosis not present

## 2012-04-21 DIAGNOSIS — M999 Biomechanical lesion, unspecified: Secondary | ICD-10-CM | POA: Diagnosis not present

## 2012-04-21 DIAGNOSIS — M545 Low back pain: Secondary | ICD-10-CM | POA: Diagnosis not present

## 2012-04-29 DIAGNOSIS — M999 Biomechanical lesion, unspecified: Secondary | ICD-10-CM | POA: Diagnosis not present

## 2012-04-29 DIAGNOSIS — M545 Low back pain: Secondary | ICD-10-CM | POA: Diagnosis not present

## 2012-04-29 DIAGNOSIS — M4716 Other spondylosis with myelopathy, lumbar region: Secondary | ICD-10-CM | POA: Diagnosis not present

## 2012-04-29 DIAGNOSIS — M5137 Other intervertebral disc degeneration, lumbosacral region: Secondary | ICD-10-CM | POA: Diagnosis not present

## 2012-04-30 DIAGNOSIS — M999 Biomechanical lesion, unspecified: Secondary | ICD-10-CM | POA: Diagnosis not present

## 2012-04-30 DIAGNOSIS — M4716 Other spondylosis with myelopathy, lumbar region: Secondary | ICD-10-CM | POA: Diagnosis not present

## 2012-04-30 DIAGNOSIS — M5137 Other intervertebral disc degeneration, lumbosacral region: Secondary | ICD-10-CM | POA: Diagnosis not present

## 2012-04-30 DIAGNOSIS — M545 Low back pain: Secondary | ICD-10-CM | POA: Diagnosis not present

## 2012-05-06 DIAGNOSIS — M999 Biomechanical lesion, unspecified: Secondary | ICD-10-CM | POA: Diagnosis not present

## 2012-05-06 DIAGNOSIS — M4716 Other spondylosis with myelopathy, lumbar region: Secondary | ICD-10-CM | POA: Diagnosis not present

## 2012-05-06 DIAGNOSIS — M545 Low back pain: Secondary | ICD-10-CM | POA: Diagnosis not present

## 2012-05-06 DIAGNOSIS — M5137 Other intervertebral disc degeneration, lumbosacral region: Secondary | ICD-10-CM | POA: Diagnosis not present

## 2012-05-13 DIAGNOSIS — M999 Biomechanical lesion, unspecified: Secondary | ICD-10-CM | POA: Diagnosis not present

## 2012-05-13 DIAGNOSIS — M5137 Other intervertebral disc degeneration, lumbosacral region: Secondary | ICD-10-CM | POA: Diagnosis not present

## 2012-05-13 DIAGNOSIS — M545 Low back pain: Secondary | ICD-10-CM | POA: Diagnosis not present

## 2012-05-13 DIAGNOSIS — M4716 Other spondylosis with myelopathy, lumbar region: Secondary | ICD-10-CM | POA: Diagnosis not present

## 2012-05-20 DIAGNOSIS — M5137 Other intervertebral disc degeneration, lumbosacral region: Secondary | ICD-10-CM | POA: Diagnosis not present

## 2012-05-20 DIAGNOSIS — M4716 Other spondylosis with myelopathy, lumbar region: Secondary | ICD-10-CM | POA: Diagnosis not present

## 2012-05-20 DIAGNOSIS — M545 Low back pain: Secondary | ICD-10-CM | POA: Diagnosis not present

## 2012-05-20 DIAGNOSIS — M999 Biomechanical lesion, unspecified: Secondary | ICD-10-CM | POA: Diagnosis not present

## 2012-05-22 ENCOUNTER — Other Ambulatory Visit: Payer: Medicare Other

## 2012-05-22 ENCOUNTER — Ambulatory Visit (INDEPENDENT_AMBULATORY_CARE_PROVIDER_SITE_OTHER): Payer: Medicare Other | Admitting: Family Medicine

## 2012-05-22 ENCOUNTER — Encounter: Payer: Self-pay | Admitting: Family Medicine

## 2012-05-22 VITALS — BP 145/79 | HR 70 | Temp 98.0°F | Ht 60.25 in | Wt 123.0 lb

## 2012-05-22 DIAGNOSIS — E785 Hyperlipidemia, unspecified: Secondary | ICD-10-CM | POA: Diagnosis not present

## 2012-05-22 DIAGNOSIS — R739 Hyperglycemia, unspecified: Secondary | ICD-10-CM

## 2012-05-22 DIAGNOSIS — F32A Depression, unspecified: Secondary | ICD-10-CM

## 2012-05-22 DIAGNOSIS — L259 Unspecified contact dermatitis, unspecified cause: Secondary | ICD-10-CM

## 2012-05-22 DIAGNOSIS — L309 Dermatitis, unspecified: Secondary | ICD-10-CM

## 2012-05-22 DIAGNOSIS — I10 Essential (primary) hypertension: Secondary | ICD-10-CM

## 2012-05-22 DIAGNOSIS — D649 Anemia, unspecified: Secondary | ICD-10-CM | POA: Diagnosis not present

## 2012-05-22 DIAGNOSIS — R7309 Other abnormal glucose: Secondary | ICD-10-CM | POA: Diagnosis not present

## 2012-05-22 DIAGNOSIS — F329 Major depressive disorder, single episode, unspecified: Secondary | ICD-10-CM

## 2012-05-22 DIAGNOSIS — S62109A Fracture of unspecified carpal bone, unspecified wrist, initial encounter for closed fracture: Secondary | ICD-10-CM

## 2012-05-22 DIAGNOSIS — Z950 Presence of cardiac pacemaker: Secondary | ICD-10-CM

## 2012-05-22 HISTORY — DX: Dermatitis, unspecified: L30.9

## 2012-05-22 HISTORY — DX: Anemia, unspecified: D64.9

## 2012-05-22 HISTORY — DX: Depression, unspecified: F32.A

## 2012-05-22 LAB — HEPATIC FUNCTION PANEL
AST: 19 U/L (ref 0–37)
Albumin: 3.7 g/dL (ref 3.5–5.2)
Alkaline Phosphatase: 52 U/L (ref 39–117)
Total Bilirubin: 1.1 mg/dL (ref 0.3–1.2)

## 2012-05-22 LAB — CBC
MCHC: 32.9 g/dL (ref 30.0–36.0)
Platelets: 217 10*3/uL (ref 150.0–400.0)
RBC: 4.18 Mil/uL (ref 3.87–5.11)
WBC: 8.1 10*3/uL (ref 4.5–10.5)

## 2012-05-22 LAB — RENAL FUNCTION PANEL
CO2: 29 mEq/L (ref 19–32)
Chloride: 103 mEq/L (ref 96–112)
Creatinine, Ser: 1.1 mg/dL (ref 0.4–1.2)
GFR: 51.93 mL/min — ABNORMAL LOW (ref >60.00)
Phosphorus: 3.2 mg/dL (ref 2.3–4.6)
Sodium: 140 mEq/L (ref 135–145)

## 2012-05-22 LAB — TSH: TSH: 1.56 u[IU]/mL (ref 0.35–5.50)

## 2012-05-22 LAB — LIPID PANEL
Cholesterol: 131 mg/dL (ref 0–200)
Total CHOL/HDL Ratio: 2
Triglycerides: 97 mg/dL (ref 0.0–149.0)

## 2012-05-22 MED ORDER — VALSARTAN-HYDROCHLOROTHIAZIDE 160-12.5 MG PO TABS: 1.0000 | ORAL_TABLET | Freq: Every day | ORAL | Status: DC

## 2012-05-22 MED ORDER — ATORVASTATIN CALCIUM 10 MG PO TABS: 10.0000 mg | ORAL_TABLET | Freq: Every day | ORAL | Status: DC

## 2012-05-22 MED ORDER — CITALOPRAM HYDROBROMIDE 10 MG PO TABS: 10.0000 mg | ORAL_TABLET | Freq: Every day | ORAL | Status: DC

## 2012-05-22 MED ORDER — METFORMIN HCL ER 500 MG PO TB24: 500.0000 mg | ORAL_TABLET | Freq: Every day | ORAL | Status: DC

## 2012-05-22 NOTE — Progress Notes (Signed)
Patient ID: Briana Thornton, female   DOB: Mar 02, 1927, 78 y.o.   MRN: [id number] Kandance Yano [id number] 1926/12/16 05/22/2012      Progress Note New Patient  Subjective  Chief Complaint  Chief Complaint  Patient presents with  . Follow-up    HPI  Patient is an 78 year old Caucasian female who is in today for follow up with her daughter. She fell earlier this year and broke her wrist had to have surgery and ultimately to therapy and is doing much better at this time. She and her daughter both nodular biggest concern at present really is hopelessness and depression. She is always frustrated about being in the way people needing to help her. She is a very active person and might be helpful. No acute concerns physically notes she's doing great. No recent illness, fevers, chills, chest pain palpitations, shortness of breath, GI or GU complaints are noted.  Past Medical History  Diagnosis Date  . Chicken pox as a child  . Measles as a child  . IBS (irritable bowel syndrome)   . Diabetes mellitus 47    type 2  . Hyperlipidemia 72  . Hypertension 72  . Vision loss of right eye   . Vertigo 2010    benign  . Calcification of cartilage     ear  . Cardiovascular system disease 07/02/2011  . Hemorrhoid 07/02/2011  . Osteoporosis 07/02/2011  . Weight loss 07/31/2011  . Anemia 05/22/2012  . Broken wrist 10-22-11    left  . Depression 05/22/2012  . Dermatitis 05/22/2012    Right neck    Past Surgical History  Procedure Date  . Hemorroidectomy 1979  . Knee scoped 2002    right  . Rotator cuff repair 2004    right  . Abdominal hysterectomy 2008    partial still has ovaries  . Lens implant left 2006  . Pacemaker insertion 2010  . Wrist surgery 11-05-11    left wrist    Family History  Problem Relation Age of Onset  . Diabetes Mother     type 2  . Hypertension Mother   . Cancer Brother     lung-smoker  . Diabetes Daughter     pre diabetic  . Diabetes Son     type 2  . Cancer  Maternal Grandfather     prostate    History   Social History  . Marital Status: Widowed    Spouse Name: N/A    Number of Children: N/A  . Years of Education: N/A   Occupational History  . Not on file.   Social History Main Topics  . Smoking status: Never Smoker   . Smokeless tobacco: Never Used  . Alcohol Use: No  . Drug Use: No  . Sexually Active: Not on file   Other Topics Concern  . Not on file   Social History Narrative  . No narrative on file    Current Outpatient Prescriptions on File Prior to Visit  Medication Sig Dispense Refill  . atorvastatin (LIPITOR) 10 MG tablet Take 1 tablet (10 mg total) by mouth daily.  90 tablet  3  . citalopram (CELEXA) 10 MG tablet Take 1 tablet (10 mg total) by mouth daily.  30 tablet  3  . glucose blood (FREESTYLE LITE) test strip Use as directed  100 each  2  . metFORMIN (GLUCOPHAGE-XR) 500 MG 24 hr tablet Take 1 tablet (500 mg total) by mouth daily with breakfast.  90 tablet  3  .  valsartan-hydrochlorothiazide (DIOVAN-HCT) 160-12.5 MG per tablet Take 1 tablet by mouth daily.  90 tablet  3    No Known Allergies  Review of Systems  Review of Systems  Constitutional: Negative for fever, chills and malaise/fatigue.  HENT: Negative for hearing loss, nosebleeds and congestion.   Eyes: Negative for discharge.  Respiratory: Negative for cough, sputum production, shortness of breath and wheezing.   Cardiovascular: Negative for chest pain, palpitations and leg swelling.  Gastrointestinal: Negative for heartburn, nausea, vomiting, abdominal pain, diarrhea, constipation and blood in stool.  Genitourinary: Negative for dysuria, urgency, frequency and hematuria.  Musculoskeletal: Negative for myalgias, back pain and falls.  Skin: Negative for rash.  Neurological: Negative for dizziness, tremors, sensory change, focal weakness, loss of consciousness, weakness and headaches.  Endo/Heme/Allergies: Negative for polydipsia. Does not  bruise/bleed easily.  Psychiatric/Behavioral: Positive for depression. Negative for suicidal ideas. The patient is nervous/anxious. The patient does not have insomnia.     Objective  BP 145/79  Pulse 70  Temp 98 F (36.7 C) (Temporal)  Ht 5' 0.25" (1.53 m)  Wt 123 lb (55.792 kg)  BMI 23.82 kg/m2  SpO2 100%  Physical Exam  Physical Exam  Constitutional: She is oriented to person, place, and time and well-developed, well-nourished, and in no distress. No distress.  HENT:  Head: Normocephalic and atraumatic.  Right Ear: External ear normal.  Left Ear: External ear normal.  Nose: Nose normal.  Mouth/Throat: Oropharynx is clear and moist. No oropharyngeal exudate.  Eyes: Conjunctivae normal are normal. Pupils are equal, round, and reactive to light. Right eye exhibits no discharge. Left eye exhibits no discharge. No scleral icterus.  Neck: Normal range of motion. Neck supple. No thyromegaly present.  Cardiovascular: Normal rate, regular rhythm and intact distal pulses.   Murmur heard.      2/6 sys murmur  Pulmonary/Chest: Effort normal and breath sounds normal. No respiratory distress. She has no wheezes. She has no rales.  Abdominal: Soft. Bowel sounds are normal. She exhibits no distension and no mass. There is no tenderness.  Musculoskeletal: Normal range of motion. She exhibits no edema and no tenderness.  Lymphadenopathy:    She has no cervical adenopathy.  Neurological: She is alert and oriented to person, place, and time. She has normal reflexes. No cranial nerve deficit. Coordination normal.  Skin: Skin is warm and dry. Rash noted. She is not diaphoretic. There is erythema.       Right neck, raised, scaly red 1 cm   Psychiatric: Mood, memory and affect normal.       Assessment & Plan  Anemia Repeat cbc today  Hypertension Adequately controlled today. No concerns identified.   Depression Patient very hopelss and anhedonia and has not been eating well. Agrees to  start Citalopram 10 mg daily  Dermatitis Present for 6-12 months, scaly, initially papular. Now macular and now itchy, consider Tinea and try Lotrimin bid if no improvement to derm  Broken wrist S/p surgical correction doing very well, minimal discomfort and good use  Diabetes mellitus Check hgba1c today, continue current meds for now  Hyperlipidemia Check lipids and continue meds  Pacemaker-[employer] Doing well, on annual schedule with cardiology at present time

## 2012-05-22 NOTE — Patient Instructions (Addendum)
Try Lotrimin twice daily if no improvement to dermatology  Depression, Adult Depression refers to feeling sad, low, down in the dumps, blue, gloomy, or empty. In general, there are two kinds of depression: 1. Depression that we all experience from time to time because of upsetting life experiences, including the loss of a job or the ending of a relationship (normal sadness or normal grief). This kind of depression is considered normal, is short lived, and resolves within a few days to 2 weeks. (Depression experienced after the loss of a loved one is called bereavement. Bereavement often lasts longer than 2 weeks but normally gets better with time.) 2. Clinical depression, which lasts longer than normal sadness or normal grief or interferes with your ability to function at home, at work, and in school. It also interferes with your personal relationships. It affects almost every aspect of your life. Clinical depression is an illness. Symptoms of depression also can be caused by conditions other than normal sadness and grief or clinical depression. Examples of these conditions are listed as follows:  Physical illness Some physical illnesses, including underactive thyroid gland (hypothyroidism), severe anemia, specific types of cancer, diabetes, uncontrolled seizures, heart and lung problems, strokes, and chronic pain are commonly associated with symptoms of depression.  Side effects of some prescription medicine In some people, certain types of prescription medicine can cause symptoms of depression.  Substance abuse Abuse of alcohol and illicit drugs can cause symptoms of depression. SYMPTOMS Symptoms of normal sadness and normal grief include the following:  Feeling sad or crying for short periods of time.  Not caring about anything (apathy).  Difficulty sleeping or sleeping too much.  No longer able to enjoy the things you used to enjoy.  Desire to be by oneself all the time (social  isolation).  Lack of energy or motivation.  Difficulty concentrating or remembering.  Change in appetite or weight.  Restlessness or agitation. Symptoms of clinical depression include the same symptoms of normal sadness or normal grief and also the following symptoms:  Feeling sad or crying all the time.  Feelings of guilt or worthlessness.  Feelings of hopelessness or helplessness.  Thoughts of suicide or the desire to harm yourself (suicidal ideation).  Loss of touch with reality (psychotic symptoms). Seeing or hearing things that are not real (hallucinations) or having false beliefs about your life or the people around you (delusions and paranoia). DIAGNOSIS  The diagnosis of clinical depression usually is based on the severity and duration of the symptoms. Your caregiver also will ask you questions about your medical history and substance use to find out if physical illness, use of prescription medicine, or substance abuse is causing your depression. Your caregiver also may order blood tests. TREATMENT  Typically, normal sadness and normal grief do not require treatment. However, sometimes antidepressant medicine is prescribed for bereavement to ease the depressive symptoms until they resolve. The treatment for clinical depression depends on the severity of your symptoms but typically includes antidepressant medicine, counseling with a mental health professional, or a combination of both. Your caregiver will help to determine what treatment is best for you. Depression caused by physical illness usually goes away with appropriate medical treatment of the illness. If prescription medicine is causing depression, talk with your caregiver about stopping the medicine, decreasing the dose, or substituting another medicine. Depression caused by abuse of alcohol or illicit drugs abuse goes away with abstinence from these substances. Some adults need professional help in order to stop drinking  or  using drugs. SEEK IMMEDIATE CARE IF:  You have thoughts about hurting yourself or others.  You lose touch with reality (have psychotic symptoms).  You are taking medicine for depression and have a serious side effect. FOR MORE INFORMATION National Alliance on Mental Illness: www.nami.[employer]: http://www.maynard.net/ Document Released: 05/11/2000 Document Revised: 11/13/2011 Document Reviewed: 08/13/2011 [facility] Patient Information 2013 [place], [place].

## 2012-05-22 NOTE — Assessment & Plan Note (Signed)
Doing well, on annual schedule with cardiology at present time

## 2012-05-22 NOTE — Assessment & Plan Note (Signed)
Present for 6-12 months, scaly, initially papular. Now macular and now itchy, consider Tinea and try Lotrimin bid if no improvement to derm

## 2012-05-22 NOTE — Assessment & Plan Note (Signed)
Adequately controlled today. No concerns identified.

## 2012-05-22 NOTE — Assessment & Plan Note (Signed)
S/p surgical correction doing very well, minimal discomfort and good use

## 2012-05-22 NOTE — Assessment & Plan Note (Signed)
Check lipids and continue meds

## 2012-05-22 NOTE — Assessment & Plan Note (Signed)
Repeat cbc today 

## 2012-05-22 NOTE — Assessment & Plan Note (Signed)
Check hgba1c today, continue current meds for now

## 2012-05-22 NOTE — Assessment & Plan Note (Signed)
Patient very hopelss and anhedonia and has not been eating well. Agrees to start Citalopram 10 mg daily

## 2012-05-26 DIAGNOSIS — M545 Low back pain: Secondary | ICD-10-CM | POA: Diagnosis not present

## 2012-05-26 DIAGNOSIS — M999 Biomechanical lesion, unspecified: Secondary | ICD-10-CM | POA: Diagnosis not present

## 2012-05-26 DIAGNOSIS — M4716 Other spondylosis with myelopathy, lumbar region: Secondary | ICD-10-CM | POA: Diagnosis not present

## 2012-05-26 DIAGNOSIS — M5137 Other intervertebral disc degeneration, lumbosacral region: Secondary | ICD-10-CM | POA: Diagnosis not present

## 2012-06-30 ENCOUNTER — Encounter: Payer: Self-pay | Admitting: Internal Medicine

## 2012-06-30 ENCOUNTER — Ambulatory Visit (INDEPENDENT_AMBULATORY_CARE_PROVIDER_SITE_OTHER): Payer: Medicare Other | Admitting: *Deleted

## 2012-06-30 DIAGNOSIS — I495 Sick sinus syndrome: Secondary | ICD-10-CM

## 2012-06-30 DIAGNOSIS — Z950 Presence of cardiac pacemaker: Secondary | ICD-10-CM | POA: Diagnosis not present

## 2012-06-30 LAB — REMOTE PACEMAKER DEVICE
AL AMPLITUDE: 1 mv
BAMS-0001: 180 {beats}/min
BAMS-0003: 60 {beats}/min
BATTERY VOLTAGE: 2.95 V
RV LEAD AMPLITUDE: 8.2 mv

## 2012-07-09 ENCOUNTER — Other Ambulatory Visit: Payer: Self-pay | Admitting: Family Medicine

## 2012-07-17 ENCOUNTER — Encounter: Payer: Self-pay | Admitting: *Deleted

## 2012-08-13 DIAGNOSIS — M4716 Other spondylosis with myelopathy, lumbar region: Secondary | ICD-10-CM | POA: Diagnosis not present

## 2012-08-13 DIAGNOSIS — M5137 Other intervertebral disc degeneration, lumbosacral region: Secondary | ICD-10-CM | POA: Diagnosis not present

## 2012-08-13 DIAGNOSIS — M545 Low back pain: Secondary | ICD-10-CM | POA: Diagnosis not present

## 2012-08-27 DIAGNOSIS — M4716 Other spondylosis with myelopathy, lumbar region: Secondary | ICD-10-CM | POA: Diagnosis not present

## 2012-08-27 DIAGNOSIS — M5137 Other intervertebral disc degeneration, lumbosacral region: Secondary | ICD-10-CM | POA: Diagnosis not present

## 2012-08-27 DIAGNOSIS — M999 Biomechanical lesion, unspecified: Secondary | ICD-10-CM | POA: Diagnosis not present

## 2012-08-27 DIAGNOSIS — M545 Low back pain: Secondary | ICD-10-CM | POA: Diagnosis not present

## 2012-09-03 ENCOUNTER — Ambulatory Visit (INDEPENDENT_AMBULATORY_CARE_PROVIDER_SITE_OTHER): Payer: Medicare Other | Admitting: Family Medicine

## 2012-09-03 ENCOUNTER — Encounter: Payer: Self-pay | Admitting: Family Medicine

## 2012-09-03 VITALS — BP 104/52 | HR 64 | Temp 99.0°F | Ht 60.25 in | Wt 119.0 lb

## 2012-09-03 DIAGNOSIS — H8110 Benign paroxysmal vertigo, unspecified ear: Secondary | ICD-10-CM | POA: Diagnosis not present

## 2012-09-03 DIAGNOSIS — I10 Essential (primary) hypertension: Secondary | ICD-10-CM

## 2012-09-03 MED ORDER — VALSARTAN-HYDROCHLOROTHIAZIDE 160-12.5 MG PO TABS: 1.0000 | ORAL_TABLET | Freq: Every day | ORAL | Status: DC

## 2012-09-03 NOTE — Progress Notes (Signed)
OFFICE NOTE  09/03/2012  CC:  Chief Complaint  Patient presents with  . Dizziness    low BP this AM     HPI: Patient is a 79 y.o. Caucasian female who is here with her daughter today for c/o feeling dizzy, low bp at home today. Describes sensation of room moving/spinning with head turns to either side (L worse than right).  No orthostatic dizziness.  No HA. Nausea comes with the vertigo but no vomiting.  No hearing complaint and no ringing in ears. Has hx of BPPV and went through vestibular rehab. No palpitations, CP, or SOB.  ROS: chronic poor appetite and hx of wt loss.  No abd pain or melena.  No fevers. Hx of anemia, but labs 04/2012 were all good (hb normal).  Pertinent PMH:  Past Medical History  Diagnosis Date  . Chicken pox as a child  . Measles as a child  . IBS (irritable bowel syndrome)   . Diabetes mellitus 47    type 2  . Hyperlipidemia 72  . Hypertension 72  . Vision loss of right eye   . Vertigo 2010    benign  . Calcification of cartilage     ear  . Cardiovascular system disease 07/02/2011  . Hemorrhoid 07/02/2011  . Osteoporosis 07/02/2011  . Weight loss 07/31/2011  . Anemia 05/22/2012  . Broken wrist 10-22-11    left  . Depression 05/22/2012  . Dermatitis 05/22/2012    Right neck    MEDS:  Atorv 10mg  qd, citalopram 10mg  qd, glucophage XR 500mg  qd, valsartan/hctz 160/12.5 --whole tab qd  PE: Blood pressure 104/52, pulse 64, temperature 99 F (37.2 C), temperature source Temporal, height 5' 0.25" (1.53 m), weight 119 lb (53.978 kg). Gen: Alert, well appearing.  Patient is oriented to person, place, time, and situation. ENT: Ears: EACs clear, normal epithelium.  TMs with good light reflex and landmarks bilaterally.  Eyes: no injection, icteris, swelling, or exudate.  EOMI, PERRLA. Nose: no drainage or turbinate edema/swelling.  No injection or focal lesion.  Mouth: lips without lesion/swelling.  Oral mucosa pink and moist.  Dentition intact and without  obvious caries or gingival swelling.  Oropharynx without erythema, exudate, or swelling.  Neck - No masses or thyromegaly or limitation in range of motion CV: RRR, no m/r/g.   LUNGS: CTA bilat, nonlabored resps, good aeration in all lung fields. ABD: soft, benign EXT: no clubbing, cyanosis, or edema.  Neuro: CN 2-12 intact bilaterally, strength 5/5 in proximal and distal upper extremities and lower extremities bilaterally.  No sensory deficits.  No tremor.  No disdiadochokinesis.  No ataxia.  Upper extremity and lower extremity DTRs symmetric.  No pronator drift. Dix-Halpike maneuvers in office today: mildly + on each side but with minimal (if any) rotary nystagmus.  IMPRESSION AND PLAN:  BPPV (benign paroxysmal positional vertigo) Recurrent. Epley's maneuver handout reviewed and given to pt and her daughter today.  Hypertension BP low today, sounds like hx of some ups and downs at home. Will err a bit on the high side with her: recommended she take only 1/2 of her diovan/HCT qd and continue home bp monitoring.   An After Visit Summary was printed and given to the patient.  FOLLOW UP: 2 wks

## 2012-09-07 NOTE — Assessment & Plan Note (Signed)
Recurrent. Epley's maneuver handout reviewed and given to pt and her daughter today.

## 2012-09-07 NOTE — Assessment & Plan Note (Signed)
BP low today, sounds like hx of some ups and downs at home. Will err a bit on the high side with her: recommended she take only 1/2 of her diovan/HCT qd and continue home bp monitoring.

## 2012-09-15 ENCOUNTER — Other Ambulatory Visit: Payer: Self-pay | Admitting: Family Medicine

## 2012-09-17 ENCOUNTER — Ambulatory Visit (INDEPENDENT_AMBULATORY_CARE_PROVIDER_SITE_OTHER): Payer: Medicare Other | Admitting: Family Medicine

## 2012-09-17 ENCOUNTER — Encounter: Payer: Self-pay | Admitting: Family Medicine

## 2012-09-17 VITALS — BP 146/75 | HR 69 | Temp 97.9°F | Resp 14 | Wt 123.5 lb

## 2012-09-17 DIAGNOSIS — I10 Essential (primary) hypertension: Secondary | ICD-10-CM | POA: Diagnosis not present

## 2012-09-17 DIAGNOSIS — R42 Dizziness and giddiness: Secondary | ICD-10-CM

## 2012-09-17 NOTE — Progress Notes (Signed)
OFFICE NOTE  09/17/2012  CC:  Chief Complaint  Patient presents with  . Follow-up    Dizziness     HPI: Patient is a 79 y.o. Caucasian female who is here for 2 wk f/u for vertigo and bp. Doing fine now.  She did do the epley's maneuvers for a couple of days but they apparently never reproduced her sx's so she stopped them. She has had normal bp OFF of bp med lately. CBGs have been in the 90s.  Pertinent PMH:  Past Medical History  Diagnosis Date  . Chicken pox as a child  . Measles as a child  . IBS (irritable bowel syndrome)   . Diabetes mellitus 47    type 2  . Hyperlipidemia 72  . Hypertension 72  . Vision loss of right eye   . Vertigo 2010    benign  . Calcification of cartilage     ear  . Cardiovascular system disease 07/02/2011  . Hemorrhoid 07/02/2011  . Osteoporosis 07/02/2011  . Weight loss 07/31/2011  . Anemia 05/22/2012  . Broken wrist 10-22-11    left  . Depression 05/22/2012  . Dermatitis 05/22/2012    Right neck    MEDS:  Outpatient Prescriptions Prior to Visit  Medication Sig Dispense Refill  . atorvastatin (LIPITOR) 10 MG tablet TAKE 1 TABLET BY MOUTH EVERY DAY  90 tablet  3  . citalopram (CELEXA) 10 MG tablet TAKE 1 TABLET BY MOUTH EVERY DAY  30 tablet  3  . glucose blood (FREESTYLE LITE) test strip Use as directed  100 each  2  . metFORMIN (GLUCOPHAGE-XR) 500 MG 24 hr tablet TAKE 1 TABLET BY MOUTH EVERY MORNING WITH BREAKFAST  90 tablet  3  . valsartan-hydrochlorothiazide (DIOVAN-HCT) 160-12.5 MG per tablet Take 1 tablet by mouth daily.    0   No facility-administered medications prior to visit.    PE: Blood pressure 146/75, pulse 69, temperature 97.9 F (36.6 C), temperature source Oral, resp. rate 14, weight 123 lb 8 oz (56.019 kg), SpO2 97.00%. Gen: Alert, well appearing.  Patient is oriented to person, place, time, and situation. CV: RRR, no m/r/g.   LUNGS: CTA bilat, nonlabored resps, good aeration in all lung fields. Neuro: no focal  deficit.  No tremor.    IMPRESSION AND PLAN:  1) Episodic vertigo (BPPV) intermixed with periods of light headed feeling. Suspect she has a bit of multifactorial balance issues: poor vision left visual field (chronic), decreased sensation in feet from DM 2, possible orthostatic bp changes).  Reassured pt.  2) HTN: doing ok w/out med.  Daughter to monitor and restart at 1/2 dose if needed.  We'll certainly err on her being a bit on the high side rather than risk hypotension with her.  FOLLOW UP: prn with her PCP Dr. Abner Greenspan

## 2012-09-19 ENCOUNTER — Encounter: Payer: Self-pay | Admitting: Family Medicine

## 2012-09-23 DIAGNOSIS — M999 Biomechanical lesion, unspecified: Secondary | ICD-10-CM | POA: Diagnosis not present

## 2012-09-23 DIAGNOSIS — M5137 Other intervertebral disc degeneration, lumbosacral region: Secondary | ICD-10-CM | POA: Diagnosis not present

## 2012-09-23 DIAGNOSIS — M545 Low back pain: Secondary | ICD-10-CM | POA: Diagnosis not present

## 2012-09-23 DIAGNOSIS — M4716 Other spondylosis with myelopathy, lumbar region: Secondary | ICD-10-CM | POA: Diagnosis not present

## 2012-10-10 DIAGNOSIS — M545 Low back pain: Secondary | ICD-10-CM | POA: Diagnosis not present

## 2012-10-10 DIAGNOSIS — M4716 Other spondylosis with myelopathy, lumbar region: Secondary | ICD-10-CM | POA: Diagnosis not present

## 2012-10-10 DIAGNOSIS — M999 Biomechanical lesion, unspecified: Secondary | ICD-10-CM | POA: Diagnosis not present

## 2012-10-10 DIAGNOSIS — M5137 Other intervertebral disc degeneration, lumbosacral region: Secondary | ICD-10-CM | POA: Diagnosis not present

## 2012-10-17 DIAGNOSIS — M545 Low back pain: Secondary | ICD-10-CM | POA: Diagnosis not present

## 2012-10-17 DIAGNOSIS — M5137 Other intervertebral disc degeneration, lumbosacral region: Secondary | ICD-10-CM | POA: Diagnosis not present

## 2012-10-17 DIAGNOSIS — M999 Biomechanical lesion, unspecified: Secondary | ICD-10-CM | POA: Diagnosis not present

## 2012-10-17 DIAGNOSIS — M4716 Other spondylosis with myelopathy, lumbar region: Secondary | ICD-10-CM | POA: Diagnosis not present

## 2012-10-30 DIAGNOSIS — M4716 Other spondylosis with myelopathy, lumbar region: Secondary | ICD-10-CM | POA: Diagnosis not present

## 2012-10-30 DIAGNOSIS — M545 Low back pain: Secondary | ICD-10-CM | POA: Diagnosis not present

## 2012-10-30 DIAGNOSIS — M999 Biomechanical lesion, unspecified: Secondary | ICD-10-CM | POA: Diagnosis not present

## 2012-10-30 DIAGNOSIS — M5137 Other intervertebral disc degeneration, lumbosacral region: Secondary | ICD-10-CM | POA: Diagnosis not present

## 2012-11-05 DIAGNOSIS — M4716 Other spondylosis with myelopathy, lumbar region: Secondary | ICD-10-CM | POA: Diagnosis not present

## 2012-11-05 DIAGNOSIS — M999 Biomechanical lesion, unspecified: Secondary | ICD-10-CM | POA: Diagnosis not present

## 2012-11-05 DIAGNOSIS — M545 Low back pain: Secondary | ICD-10-CM | POA: Diagnosis not present

## 2012-11-05 DIAGNOSIS — M5137 Other intervertebral disc degeneration, lumbosacral region: Secondary | ICD-10-CM | POA: Diagnosis not present

## 2012-11-21 DIAGNOSIS — M5137 Other intervertebral disc degeneration, lumbosacral region: Secondary | ICD-10-CM | POA: Diagnosis not present

## 2012-11-21 DIAGNOSIS — M999 Biomechanical lesion, unspecified: Secondary | ICD-10-CM | POA: Diagnosis not present

## 2012-11-21 DIAGNOSIS — M4716 Other spondylosis with myelopathy, lumbar region: Secondary | ICD-10-CM | POA: Diagnosis not present

## 2012-11-21 DIAGNOSIS — M545 Low back pain: Secondary | ICD-10-CM | POA: Diagnosis not present

## 2012-12-03 DIAGNOSIS — M4716 Other spondylosis with myelopathy, lumbar region: Secondary | ICD-10-CM | POA: Diagnosis not present

## 2012-12-03 DIAGNOSIS — M5137 Other intervertebral disc degeneration, lumbosacral region: Secondary | ICD-10-CM | POA: Diagnosis not present

## 2012-12-03 DIAGNOSIS — M999 Biomechanical lesion, unspecified: Secondary | ICD-10-CM | POA: Diagnosis not present

## 2012-12-03 DIAGNOSIS — M545 Low back pain: Secondary | ICD-10-CM | POA: Diagnosis not present

## 2012-12-08 DIAGNOSIS — M4716 Other spondylosis with myelopathy, lumbar region: Secondary | ICD-10-CM | POA: Diagnosis not present

## 2012-12-08 DIAGNOSIS — M999 Biomechanical lesion, unspecified: Secondary | ICD-10-CM | POA: Diagnosis not present

## 2012-12-08 DIAGNOSIS — M545 Low back pain: Secondary | ICD-10-CM | POA: Diagnosis not present

## 2012-12-08 DIAGNOSIS — E119 Type 2 diabetes mellitus without complications: Secondary | ICD-10-CM | POA: Diagnosis not present

## 2012-12-08 DIAGNOSIS — M5137 Other intervertebral disc degeneration, lumbosacral region: Secondary | ICD-10-CM | POA: Diagnosis not present

## 2012-12-08 LAB — HM DIABETES EYE EXAM

## 2012-12-15 DIAGNOSIS — M4716 Other spondylosis with myelopathy, lumbar region: Secondary | ICD-10-CM | POA: Diagnosis not present

## 2012-12-15 DIAGNOSIS — M545 Low back pain: Secondary | ICD-10-CM | POA: Diagnosis not present

## 2012-12-15 DIAGNOSIS — M5137 Other intervertebral disc degeneration, lumbosacral region: Secondary | ICD-10-CM | POA: Diagnosis not present

## 2012-12-15 DIAGNOSIS — M999 Biomechanical lesion, unspecified: Secondary | ICD-10-CM | POA: Diagnosis not present

## 2013-01-21 ENCOUNTER — Encounter: Payer: Self-pay | Admitting: Internal Medicine

## 2013-01-21 ENCOUNTER — Ambulatory Visit (INDEPENDENT_AMBULATORY_CARE_PROVIDER_SITE_OTHER): Payer: Medicare Other | Admitting: Internal Medicine

## 2013-01-21 VITALS — BP 178/71 | HR 68 | Ht 60.0 in | Wt 124.0 lb

## 2013-01-21 DIAGNOSIS — I495 Sick sinus syndrome: Secondary | ICD-10-CM

## 2013-01-21 DIAGNOSIS — Z950 Presence of cardiac pacemaker: Secondary | ICD-10-CM

## 2013-01-21 LAB — PACEMAKER DEVICE OBSERVATION
AL IMPEDENCE PM: 387.5 Ohm
AL THRESHOLD: 0.5 V
BAMS-0001: 180 {beats}/min
BATTERY VOLTAGE: 2.9328 V
RV LEAD IMPEDENCE PM: 550 Ohm
RV LEAD THRESHOLD: 0.5 V

## 2013-01-21 NOTE — Assessment & Plan Note (Signed)
Her [employer] dual-chamber pacemaker is working normally. We'll plan to recheck in several months. 

## 2013-01-21 NOTE — Progress Notes (Signed)
HPI [name] returns today for followup. She is a very pleasant elderly woman with a history of complete heart block, status post permanent pacemaker insertion, diabetes, and dyslipidemia. In the interim, she denies chest pain or shortness of breath. No syncope. She has trace peripheral edema. No Known Allergies   Current Outpatient Prescriptions  Medication Sig Dispense Refill  . atorvastatin (LIPITOR) 10 MG tablet TAKE 1 TABLET BY MOUTH EVERY DAY  90 tablet  3  . glucose blood (FREESTYLE LITE) test strip Use as directed  100 each  2  . metFORMIN (GLUCOPHAGE-XR) 500 MG 24 hr tablet TAKE 1 TABLET BY MOUTH EVERY MORNING WITH BREAKFAST  90 tablet  3   No current facility-administered medications for this visit.     Past Medical History  Diagnosis Date  . Chicken pox as a child  . Measles as a child  . IBS (irritable bowel syndrome)   . Diabetes mellitus 47    type 2  . Hyperlipidemia 72  . Hypertension 72  . Vision loss of right eye   . Vertigo 2010    benign  . Calcification of cartilage     ear  . Cardiovascular system disease 07/02/2011  . Hemorrhoid 07/02/2011  . Osteoporosis 07/02/2011  . Weight loss 07/31/2011  . Anemia 05/22/2012  . Broken wrist 10-22-11    left  . Depression 05/22/2012  . Dermatitis 05/22/2012    Right neck    ROS:   All systems reviewed and negative except as noted in the HPI.   Past Surgical History  Procedure Laterality Date  . Hemorroidectomy  1979  . Knee scoped  2002    right  . Rotator cuff repair  2004    right  . Abdominal hysterectomy  2008    partial still has ovaries  . Lens implant left  2006  . Pacemaker insertion  2010  . Wrist surgery  11-05-11    left wrist     Family History  Problem Relation Age of Onset  . Diabetes Mother     type 2  . Hypertension Mother   . Cancer Brother     lung-smoker  . Diabetes Daughter     pre diabetic  . Diabetes Son     type 2  . Cancer Maternal Grandfather     prostate      History   Social History  . Marital Status: Widowed    Spouse Name: N/A    Number of Children: N/A  . Years of Education: N/A   Occupational History  . Not on file.   Social History Main Topics  . Smoking status: Never Smoker   . Smokeless tobacco: Never Used  . Alcohol Use: No  . Drug Use: No  . Sexual Activity: Not on file   Other Topics Concern  . Not on file   Social History Narrative  . No narrative on file     BP 178/71  Pulse 68  Ht 5' (1.524 m)  Wt 124 lb (56.246 kg)  BMI 24.22 kg/m2  Physical Exam:  Well appearing Elderly woman,NAD HEENT: Unremarkable Neck:  No JVD, no thyromegally Back:  No CVA tenderness Lungs:  Clear with no wheezes, rales, or rhonchi. HEART:  Regular rate rhythm, no murmurs, no rubs, no clicks Abd:  soft, positive bowel sounds, no organomegally, no rebound, no guarding Ext:  2 plus pulses, no edema, no cyanosis, no clubbing Skin:  No rashes no nodules Neuro:  CN II through  XII intact, motor grossly intact   DEVICE  Normal device function.  See PaceArt for details.   Assess/Plan:

## 2013-01-21 NOTE — Patient Instructions (Signed)
Remote monitoring is used to monitor your Pacemaker of ICD from home. This monitoring reduces the number of office visits required to check your device to one time per year. It allows [place] to keep an eye on the functioning of your device to ensure it is working properly. You are scheduled for a device check from home on 04/27/13. You may send your transmission at any time that day. If you have a wireless device, the transmission will be sent automatically. After your physician reviews your transmission, you will receive a postcard with your next transmission date.   Your physician wants you to follow-up in: 12 months with Dr Court Joy will receive a reminder letter in the mail two months in advance. If you don't receive a letter, please call our office to schedule the follow-up appointment.

## 2013-02-03 DIAGNOSIS — M999 Biomechanical lesion, unspecified: Secondary | ICD-10-CM | POA: Diagnosis not present

## 2013-02-03 DIAGNOSIS — M4716 Other spondylosis with myelopathy, lumbar region: Secondary | ICD-10-CM | POA: Diagnosis not present

## 2013-02-03 DIAGNOSIS — M545 Low back pain: Secondary | ICD-10-CM | POA: Diagnosis not present

## 2013-02-03 DIAGNOSIS — M5137 Other intervertebral disc degeneration, lumbosacral region: Secondary | ICD-10-CM | POA: Diagnosis not present

## 2013-02-07 ENCOUNTER — Other Ambulatory Visit: Payer: Self-pay | Admitting: Family Medicine

## 2013-02-12 ENCOUNTER — Telehealth: Payer: Self-pay | Admitting: Family Medicine

## 2013-02-12 NOTE — Telephone Encounter (Signed)
Dr. Blyth, please advise.  

## 2013-02-12 NOTE — Telephone Encounter (Signed)
Patient Information:  Caller Name: Annabelle Harman  Phone: [phone]  Patient: Briana Thornton, Briana Thornton  Gender: Female  DOB: 1926/10/03  Age: 79 Years  PCP: Danise Edge [facility])  Office Follow Up:  Does the office need to follow up with this patient?: No  Instructions For The Office: N/A   Symptoms  Reason For Call & Symptoms: Dauther calling that her mother has "gone downhill" in the past couple of days.  Cognitively she is "off"  to the point that they have to remind her to eat and drink.  She wants to make an appt for 02/16/13 and instructed that we are only making appts for today and tomorrow.  I told her "from what you're tellling me he needs to be seen sooner than Monday".  Offered appt for today or tomorrow.  Daughter states she is a Engineer, civil (consulting) and cannot get off to bring her to the appt. She states she will call back 02/16/13.  Reviewed Health History In EMR: N/A  Reviewed Medications In EMR: N/A  Reviewed Allergies In EMR: N/A  Reviewed Surgeries / Procedures: N/A  Date of Onset of Symptoms: Unknown  Guideline(s) Used:  No Protocol Available - Information Only  Disposition Per Guideline:   Home Care  Reason For Disposition Reached:   Information only question and nurse able to answer  Advice Given:  N/A  Patient Refused Recommendation:  Patient Refused Appt, Patient Requests Appt At Later Date  Pt wanting an appt for 02/16/13.

## 2013-02-12 NOTE — Telephone Encounter (Signed)
So I agree she should come sooner but if she will not then please get her an appt for 9/22

## 2013-02-13 ENCOUNTER — Encounter: Payer: Self-pay | Admitting: Physician Assistant

## 2013-02-13 ENCOUNTER — Ambulatory Visit (INDEPENDENT_AMBULATORY_CARE_PROVIDER_SITE_OTHER): Payer: Medicare Other | Admitting: Physician Assistant

## 2013-02-13 VITALS — BP 100/64 | HR 69 | Temp 97.4°F | Resp 12 | Ht 60.0 in | Wt 116.2 lb

## 2013-02-13 DIAGNOSIS — R82998 Other abnormal findings in urine: Secondary | ICD-10-CM | POA: Diagnosis not present

## 2013-02-13 DIAGNOSIS — F09 Unspecified mental disorder due to known physiological condition: Secondary | ICD-10-CM | POA: Diagnosis not present

## 2013-02-13 DIAGNOSIS — R319 Hematuria, unspecified: Secondary | ICD-10-CM | POA: Diagnosis not present

## 2013-02-13 DIAGNOSIS — R4189 Other symptoms and signs involving cognitive functions and awareness: Secondary | ICD-10-CM

## 2013-02-13 DIAGNOSIS — R5381 Other malaise: Secondary | ICD-10-CM

## 2013-02-13 DIAGNOSIS — R531 Weakness: Secondary | ICD-10-CM

## 2013-02-13 DIAGNOSIS — R8290 Unspecified abnormal findings in urine: Secondary | ICD-10-CM

## 2013-02-13 LAB — POCT URINALYSIS DIPSTICK
Bilirubin, UA: NEGATIVE
Glucose, UA: NEGATIVE
Ketones, UA: NEGATIVE
Leukocytes, UA: NEGATIVE
Nitrite, UA: NEGATIVE

## 2013-02-13 LAB — CBC WITH DIFFERENTIAL/PLATELET
Basophils Relative: 1 % (ref 0–1)
Eosinophils Absolute: 0.1 10*3/uL (ref 0.0–0.7)
Eosinophils Relative: 2 % (ref 0–5)
Lymphs Abs: 1.6 10*3/uL (ref 0.7–4.0)
MCH: 28 pg (ref 26.0–34.0)
MCHC: 33.1 g/dL (ref 30.0–36.0)
MCV: 84.6 fL (ref 78.0–100.0)
Neutrophils Relative %: 64 % (ref 43–77)
Platelets: 260 10*3/uL (ref 150–400)
RBC: 4.29 MIL/uL (ref 3.87–5.11)
RDW: 15.1 % (ref 11.5–15.5)

## 2013-02-13 LAB — COMPREHENSIVE METABOLIC PANEL
ALT: 10 U/L (ref 0–35)
Alkaline Phosphatase: 61 U/L (ref 39–117)
CO2: 29 mEq/L (ref 19–32)
Creat: 1.09 mg/dL (ref 0.50–1.10)
Sodium: 144 mEq/L (ref 135–145)
Total Bilirubin: 1 mg/dL (ref 0.3–1.2)

## 2013-02-13 NOTE — Telephone Encounter (Signed)
Appointment scheduled for 02/13/13 at 10:00am with Malva Cogan, PA

## 2013-02-13 NOTE — Patient Instructions (Signed)
Please obtain labs.  I will call you with the results.  I will go ahead and start the process for home health to come out and give support.  If weakness/memory acutely worsen or if patient develops altered mental status, please proceed directly to the ED.

## 2013-02-14 LAB — URINALYSIS, ROUTINE W REFLEX MICROSCOPIC
Hgb urine dipstick: NEGATIVE
Leukocytes, UA: NEGATIVE
Nitrite: NEGATIVE
Protein, ur: NEGATIVE mg/dL
Urobilinogen, UA: 1 mg/dL (ref 0.0–1.0)

## 2013-02-14 LAB — CULTURE, URINE COMPREHENSIVE: Organism ID, Bacteria: NO GROWTH

## 2013-02-14 LAB — TSH: TSH: 1.738 u[IU]/mL (ref 0.350–4.500)

## 2013-02-15 DIAGNOSIS — R4189 Other symptoms and signs involving cognitive functions and awareness: Secondary | ICD-10-CM | POA: Insufficient documentation

## 2013-02-15 NOTE — Progress Notes (Signed)
Patient ID: Briana Thornton, female   DOB: Mar 20, 1927, 79 y.o.   MRN: [id number]  Patient presents to clinic today with daughter.  Daughter states that she has noticed a decline in her mother's mental functioning over the past two weeks.  Mother seems more forgetful, to the point of requiring help to dress and has to be reminded to eat.  Denies diagnosis of alzheimer's disease.  Endorses mother has lost weight due to not eating.  Denies history of stoke.  Endorses history of depression.  Patient has history of falls in the past.  Daughter is concerned because of the rapid decline in function.  Past Medical History  Diagnosis Date  . Chicken pox as a child  . Measles as a child  . IBS (irritable bowel syndrome)   . Diabetes mellitus 47    type 2  . Hyperlipidemia 72  . Hypertension 72  . Vision loss of right eye   . Vertigo 2010    benign  . Calcification of cartilage     ear  . Cardiovascular system disease 07/02/2011  . Hemorrhoid 07/02/2011  . Osteoporosis 07/02/2011  . Weight loss 07/31/2011  . Anemia 05/22/2012  . Broken wrist 10-22-11    left  . Depression 05/22/2012  . Dermatitis 05/22/2012    Right neck    Current Outpatient Prescriptions on File Prior to Visit  Medication Sig Dispense Refill  . atorvastatin (LIPITOR) 10 MG tablet TAKE 1 TABLET BY MOUTH EVERY DAY  90 tablet  3  . glucose blood (FREESTYLE LITE) test strip Use as directed  100 each  2  . metFORMIN (GLUCOPHAGE-XR) 500 MG 24 hr tablet TAKE 1 TABLET BY MOUTH EVERY MORNING WITH BREAKFAST  90 tablet  3   No current facility-administered medications on file prior to visit.    No Known Allergies  Family History  Problem Relation Age of Onset  . Diabetes Mother     type 2  . Hypertension Mother   . Cancer Brother     lung-smoker  . Diabetes Daughter     pre diabetic  . Diabetes Son     type 2  . Cancer Maternal Grandfather     prostate    History   Social History  . Marital Status: Widowed    Spouse Name:  N/A    Number of Children: N/A  . Years of Education: N/A   Social History Main Topics  . Smoking status: Never Smoker   . Smokeless tobacco: Never Used  . Alcohol Use: No  . Drug Use: No  . Sexual Activity: None   Other Topics Concern  . None   Social History Narrative  . None   ROS See HPI   Filed Vitals:   02/13/13 1017  BP: 100/64  Pulse: 69  Temp: 97.4 F (36.3 C)  Resp: 12   Physical Exam  Vitals reviewed. Constitutional: She is oriented to person, place, and time and well-developed, well-nourished, and in no distress.  HENT:  Head: Normocephalic and atraumatic.  Right Ear: External ear normal.  Left Ear: External ear normal.  Eyes: Conjunctivae and EOM are normal. Pupils are equal, round, and reactive to light.  Neck: Neck supple.  Cardiovascular: Normal rate, regular rhythm, normal heart sounds and intact distal pulses.   Pulmonary/Chest: Effort normal and breath sounds normal. No respiratory distress. She has no wheezes. She has no rales. She exhibits no tenderness.  Lymphadenopathy:    She has no cervical adenopathy.  Neurological: She  is alert and oriented to person, place, and time. She has normal sensation and intact cranial nerves. She is not agitated and not disoriented. She displays facial symmetry and normal speech. She exhibits normal muscle tone. GCS score is 15.  Strength 4/5 symmetric  Psychiatric: Memory and affect normal.     Recent Results (from the past 2160 hour(s))  HM DIABETES EYE EXAM     Status: None   Collection Time    12/08/12 12:00 AM      Result Value Range   HM Diabetic Eye Exam       Value: [facility]- Diabetic oculopathy/retinopathy present   PACEMAKER DEVICE OBSERVATION     Status: None   Collection Time    01/21/13  5:17 PM      Result Value Range   DEVICE MODEL PM [id number]     [id number] [place] Sandi Carne [facility] NOTES PM       Value: Pacemaker check in clinic. Normal device  function. Thresholds, sensing, impedances consistent with previous measurements. Device programmed to maximize longevity. No mode switch or high ventricular rates noted. Device programmed at appropriate safety      margins. Histogram distribution appropriate for patient activity level. Device programmed to optimize intrinsic conduction. Estimated longevity 6.55yrs. Merlin 04/27/13 & ROV w/ GT in 12 mo.   ATRIAL PACING PM 59     VENTRICULAR PACING PM 86     BATTERY VOLTAGE 2.9328     AL IMPEDENCE PM 387.5     RV LEAD IMPEDENCE PM 550.0     AL THRESHOLD 0.5     RV LEAD THRESHOLD 0.5     BAMS-0001 180     BAMS-0003 60    POCT URINALYSIS DIPSTICK     Status: None   Collection Time    02/13/13 11:15 AM      Result Value Range   Color, UA gold     Clarity, UA dark     Glucose, UA neg     Bilirubin, UA neg     Ketones, UA neg     Spec Grav, UA 1.020     Blood, UA Hemolyzed Trace     pH, UA 6.0     Protein, UA Trace     Urobilinogen, UA 0.2     Nitrite, UA neg     Leukocytes, UA Negative    CULTURE, URINE COMPREHENSIVE     Status: None   Collection Time    02/13/13 12:02 PM      Result Value Range   Colony Count NO GROWTH     Organism ID, Bacteria NO GROWTH    URINALYSIS, ROUTINE W REFLEX MICROSCOPIC     Status: None   Collection Time    02/13/13 12:10 PM      Result Value Range   Color, Urine YELLOW  YELLOW   APPearance CLEAR  CLEAR   Specific Gravity, Urine 1.016  1.005 - 1.030   pH 5.5  5.0 - 8.0   Glucose, UA NEG  NEG mg/dL   Bilirubin Urine NEG  NEG   Ketones, ur NEG  NEG mg/dL   Hgb urine dipstick NEG  NEG   Protein, ur NEG  NEG mg/dL   Urobilinogen, UA 1  0.0 - 1.0 mg/dL   Nitrite NEG  NEG   Leukocytes, UA NEG  NEG  CBC WITH DIFFERENTIAL  Status: None   Collection Time    02/13/13  1:14 PM      Result Value Range   WBC 6.0  4.0 - 10.5 K/uL   RBC 4.29  3.87 - 5.11 MIL/uL   Hemoglobin 12.0  12.0 - 15.0 g/dL   HCT 21.3  08.6 - 57.8 %   MCV 84.6  78.0 - 100.0 fL    MCH 28.0  26.0 - 34.0 pg   MCHC 33.1  30.0 - 36.0 g/dL   RDW 46.9  62.9 - 52.8 %   Platelets 260  150 - 400 K/uL   Neutrophils Relative % 64  43 - 77 %   Neutro Abs 3.9  1.7 - 7.7 K/uL   Lymphocytes Relative 27  12 - 46 %   Lymphs Abs 1.6  0.7 - 4.0 K/uL   Monocytes Relative 6  3 - 12 %   Monocytes Absolute 0.4  0.1 - 1.0 K/uL   Eosinophils Relative 2  0 - 5 %   Eosinophils Absolute 0.1  0.0 - 0.7 K/uL   Basophils Relative 1  0 - 1 %   Basophils Absolute 0.0  0.0 - 0.1 K/uL   Smear Review Criteria for review not met    COMPREHENSIVE METABOLIC PANEL     Status: Abnormal   Collection Time    02/13/13  1:14 PM      Result Value Range   Sodium 144  135 - 145 mEq/L   Potassium 3.9  3.5 - 5.3 mEq/L   Chloride 107  96 - 112 mEq/L   CO2 29  19 - 32 mEq/L   Glucose, Bld 145 (*) 70 - 99 mg/dL   BUN 24 (*) 6 - 23 mg/dL   Creat 4.13  2.44 - 0.10 mg/dL   Total Bilirubin 1.0  0.3 - 1.2 mg/dL   Alkaline Phosphatase 61  39 - 117 U/L   AST 17  0 - 37 U/L   ALT 10  0 - 35 U/L   Total Protein 6.4  6.0 - 8.3 g/dL   Albumin 4.0  3.5 - 5.2 g/dL   Calcium 9.8  8.4 - 27.2 mg/dL  TSH     Status: None   Collection Time    02/13/13  1:14 PM      Result Value Range   TSH 1.738  0.350 - 4.500 uIU/mL    Assessment/Plan: Cognitive decline Physical Examination reveals no signs of stroke or trauma.  Patient is oriented to person, place and time.  Memory seems intake.  Mentation slightly slowed.  Neurological exam within normal limits.  Will obtain UA, urine culture, BMP, CBC, TSH.  Will call patient with results.  F/U in 2 weeks if labs normal with PCP Dr. Abner Greenspan.

## 2013-02-15 NOTE — Assessment & Plan Note (Signed)
Physical Examination reveals no signs of stroke or trauma.  Patient is oriented to person, place and time.  Memory seems intake.  Mentation slightly slowed.  Neurological exam within normal limits.  Will obtain UA, urine culture, BMP, CBC, TSH.  Will call patient with results.  F/U in 2 weeks if labs normal with PCP Dr. Abner Greenspan.

## 2013-02-18 DIAGNOSIS — M5137 Other intervertebral disc degeneration, lumbosacral region: Secondary | ICD-10-CM | POA: Diagnosis not present

## 2013-02-18 DIAGNOSIS — M4716 Other spondylosis with myelopathy, lumbar region: Secondary | ICD-10-CM | POA: Diagnosis not present

## 2013-02-18 DIAGNOSIS — M545 Low back pain: Secondary | ICD-10-CM | POA: Diagnosis not present

## 2013-02-18 DIAGNOSIS — M999 Biomechanical lesion, unspecified: Secondary | ICD-10-CM | POA: Diagnosis not present

## 2013-03-02 ENCOUNTER — Ambulatory Visit (INDEPENDENT_AMBULATORY_CARE_PROVIDER_SITE_OTHER): Payer: Medicare Other | Admitting: Family Medicine

## 2013-03-02 ENCOUNTER — Encounter: Payer: Self-pay | Admitting: Family Medicine

## 2013-03-02 VITALS — BP 148/62 | HR 67 | Temp 98.2°F | Ht 60.25 in | Wt 116.1 lb

## 2013-03-02 DIAGNOSIS — R413 Other amnesia: Secondary | ICD-10-CM | POA: Diagnosis not present

## 2013-03-02 DIAGNOSIS — Z23 Encounter for immunization: Secondary | ICD-10-CM | POA: Diagnosis not present

## 2013-03-02 DIAGNOSIS — I10 Essential (primary) hypertension: Secondary | ICD-10-CM | POA: Diagnosis not present

## 2013-03-02 DIAGNOSIS — R4189 Other symptoms and signs involving cognitive functions and awareness: Secondary | ICD-10-CM

## 2013-03-02 DIAGNOSIS — K589 Irritable bowel syndrome without diarrhea: Secondary | ICD-10-CM

## 2013-03-02 DIAGNOSIS — F09 Unspecified mental disorder due to known physiological condition: Secondary | ICD-10-CM | POA: Diagnosis not present

## 2013-03-02 MED ORDER — DONEPEZIL HCL 5 MG PO TABS: 5.0000 mg | ORAL_TABLET | Freq: Every day | ORAL | Status: DC

## 2013-03-02 MED ORDER — DONEPEZIL HCL 10 MG PO TABS: 10.0000 mg | ORAL_TABLET | Freq: Every day | ORAL | Status: DC

## 2013-03-02 NOTE — Patient Instructions (Addendum)
Dementia Dementia is a general term for problems with brain function. A person with dementia has memory loss and a hard time with at least one other brain function such as thinking, speaking, or problem solving. Dementia can affect social functioning, how you do your job, your mood, or your personality. The changes may be hidden for a long time. The earliest forms of this disease are usually not detected by family or friends. Dementia can be:  Irreversible.  Potentially reversible.  Partially reversible.  Progressive. This means it can get worse over time. CAUSES  Irreversible dementia causes may include:  Degeneration of brain cells (Alzheimer's disease or lewy body dementia).  Multiple small strokes (vascular dementia).  Infection (chronic meningitis or Creutzfelt-Jakob disease).  Frontotemporal dementia. This affects younger people, age 40 to 70, compared to those who have Alzheimer's disease.  Dementia associated with other disorders like Parkinson's disease, Huntington's disease, or HIV-associated dementia. Potentially or partially reversible dementia causes may include:  Medicines.  Metabolic causes such as excessive alcohol intake, vitamin B12 deficiency, or thyroid disease.  Masses or pressure in the brain such as a tumor, blood clot, or hydrocephalus. SYMPTOMS  Symptoms are often hard to detect. Family members or coworkers may not notice them early in the disease process. Different people with dementia may have different symptoms. Symptoms can include:  A hard time with memory, especially recent memory. Long-term memory may not be impaired.  Asking the same question multiple times or forgetting something someone just said.  A hard time speaking your thoughts or finding certain words.  A hard time solving problems or performing familiar tasks (such as how to use a telephone).  Sudden changes in mood.  Changes in personality, especially increasing moodiness or  mistrust.  Depression.  A hard time understanding complex ideas that were never a problem in the past. DIAGNOSIS  There are no specific tests for dementia.   Your caregiver may recommend a thorough evaluation. This is because some forms of dementia can be reversible. The evaluation will likely include a physical exam and getting a detailed history from you and a family member. The history often gives the best clues and suggestions for a diagnosis.  Memory testing may be done. A detailed brain function evaluation called neuropsychologic testing may be helpful.  Lab tests and brain imaging (such as a CT scan or MRI scan) are sometimes important.  Sometimes observation and re-evaluation over time is very helpful. TREATMENT  Treatment depends on the cause.   If the problem is a vitamin deficiency, it may be helped or cured with supplements.  For dementias such as Alzheimer's disease, medicines are available to stabilize or slow the course of the disease. There are no cures for this type of dementia.  Your caregiver can help direct you to groups, organizations, and other caregivers to help with decisions in the care of you or your loved one. HOME CARE INSTRUCTIONS The care of individuals with dementia is varied and dependent upon the progression of the dementia. The following suggestions are intended for the person living with, or caring for, the person with dementia.  Create a safe environment.  Remove the locks on bathroom doors to prevent the person from accidentally locking himself or herself in.  Use childproof latches on kitchen cabinets and any place where cleaning supplies, chemicals, or alcohol are kept.  Use childproof covers in unused electrical outlets.  Install childproof devices to keep doors and windows secured.  Remove stove knobs or install safety   knobs and an automatic shut-off on the stove.  Lower the temperature on water heaters.  Label medicines and keep them  locked up.  Secure knives, lighters, matches, power tools, and guns, and keep these items out of reach.  Keep the house free from clutter. Remove rugs or anything that might contribute to a fall.  Remove objects that might break and hurt the person.  Make sure lighting is good, both inside and outside.  Install grab rails as needed.  Use a monitoring device to alert you to falls or other needs for help.  Reduce confusion.  Keep familiar objects and people around.  Use night lights or dim lights at night.  Label items or areas.  Use reminders, notes, or directions for daily activities or tasks.  Keep a simple, consistent routine for waking, meals, bathing, dressing, and bedtime.  Create a calm, quiet environment.  Place large clocks and calendars prominently.  Display emergency numbers and home address near all telephones.  Use cues to establish different times of the day. An example is to open curtains to let the natural light in during the day.   Use effective communication.  Choose simple words and short sentences.  Use a gentle, calm tone of voice.  Be careful not to interrupt.  If the person is struggling to find a word or communicate a thought, try to provide the word or thought.  Ask one question at a time. Allow the person ample time to answer questions. Repeat the question again if the person does not respond.  Reduce nighttime restlessness.  Provide a comfortable bed.  Have a consistent nighttime routine.  Ensure a regular walking or physical activity schedule. Involve the person in daily activities as much as possible.  Limit napping during the day.  Limit caffeine.  Attend social events that stimulate rather than overwhelm the senses.  Encourage good nutrition and hydration.  Reduce distractions during meal times and snacks.  Avoid foods that are too hot or too cold.  Monitor chewing and swallowing ability.  Continue with routine vision,  hearing, dental, and medical screenings.  Only give over-the-counter or prescription medicines as directed by the caregiver.  Monitor driving abilities. Do not allow the person to drive when safe driving is no longer possible.  Register with an identification program which could provide location assistance in the event of a missing person situation. SEEK MEDICAL CARE IF:   New behavioral problems start such as moodiness, aggressiveness, or seeing things that are not there (hallucinations).  Any new problem with brain function happens. This includes problems with balance, speech, or falling a lot.  Problems with swallowing develop.  Any symptoms of other illness happen. Small changes or worsening in any aspect of brain function can be a sign that the illness is getting worse. It can also be a sign of another medical illness such as infection. Seeing a caregiver right away is important. SEEK IMMEDIATE MEDICAL CARE IF:   A fever develops.  New or worsened confusion develops.  New or worsened sleepiness develops.  Staying awake becomes hard to do. Document Released: 11/07/2000 Document Revised: 08/06/2011 Document Reviewed: 10/09/2010 [employer] Patient Information 2014 [employer].  

## 2013-03-02 NOTE — Progress Notes (Signed)
Patient ID: Briana Thornton, female   DOB: 31-Aug-1926, 79 y.o.   MRN: [id number] Loyalty Arentz [id number] 1926/10/17 03/02/2013      Progress Note-Follow Up  Subjective  Chief Complaint  Chief Complaint  Patient presents with  . Follow-up    2 week  . Injections    flu- high dose    HPI  89-year-old Caucasian female who is in today accompanied by her daughter. Noting a rapid in her memory. Until the last month she's been able to perform her ADLs herself. Now her family reports she forgets to test her self and clean herself. Has become incontinent at times and does not cleaned well and she is toileting. Denies fevers chills or any physical complaints. Does have persistent IBS symptoms with intermittent constipation and diarrhea but no bloody or tarry stool. Was struggling with low blood pressure and poor balance but that is improving. No chest pain or palpitations. No shortness of breath or anorexia.  Past Medical History  Diagnosis Date  . Chicken pox as a child  . Measles as a child  . IBS (irritable bowel syndrome)   . Diabetes mellitus 47    type 2  . Hyperlipidemia 72  . Hypertension 72  . Vision loss of right eye   . Vertigo 2010    benign  . Calcification of cartilage     ear  . Cardiovascular system disease 07/02/2011  . Hemorrhoid 07/02/2011  . Osteoporosis 07/02/2011  . Weight loss 07/31/2011  . Anemia 05/22/2012  . Broken wrist 10-22-11    left  . Depression 05/22/2012  . Dermatitis 05/22/2012    Right neck    Past Surgical History  Procedure Laterality Date  . Hemorroidectomy  1979  . Knee scoped  2002    right  . Rotator cuff repair  2004    right  . Abdominal hysterectomy  2008    partial still has ovaries  . Lens implant left  2006  . Pacemaker insertion  2010  . Wrist surgery  11-05-11    left wrist    Family History  Problem Relation Age of Onset  . Diabetes Mother     type 2  . Hypertension Mother   . Cancer Brother     lung-smoker  . Diabetes  Daughter     pre diabetic  . Diabetes Son     type 2  . Cancer Maternal Grandfather     prostate    History   Social History  . Marital Status: Widowed    Spouse Name: N/A    Number of Children: N/A  . Years of Education: N/A   Occupational History  . Not on file.   Social History Main Topics  . Smoking status: Never Smoker   . Smokeless tobacco: Never Used  . Alcohol Use: No  . Drug Use: No  . Sexual Activity: Not on file   Other Topics Concern  . Not on file   Social History Narrative  . No narrative on file    Current Outpatient Prescriptions on File Prior to Visit  Medication Sig Dispense Refill  . atorvastatin (LIPITOR) 10 MG tablet TAKE 1 TABLET BY MOUTH EVERY DAY  90 tablet  3  . glucose blood (FREESTYLE LITE) test strip Use as directed  100 each  2  . metFORMIN (GLUCOPHAGE-XR) 500 MG 24 hr tablet TAKE 1 TABLET BY MOUTH EVERY MORNING WITH BREAKFAST  90 tablet  3   No current facility-administered medications on file  prior to visit.    No Known Allergies  Review of Systems  Review of Systems  Constitutional: Negative for fever and malaise/fatigue.  HENT: Negative for congestion.   Eyes: Negative for discharge.  Respiratory: Negative for shortness of breath.   Cardiovascular: Negative for chest pain, palpitations and leg swelling.  Gastrointestinal: Negative for nausea, abdominal pain and diarrhea.  Genitourinary: Negative for dysuria.  Musculoskeletal: Negative for falls.  Skin: Negative for rash.  Neurological: Negative for loss of consciousness and headaches.  Endo/Heme/Allergies: Negative for polydipsia.  Psychiatric/Behavioral: Positive for memory loss. Negative for depression and suicidal ideas. The patient is not nervous/anxious and does not have insomnia.     Objective  BP 148/62  Pulse 67  Temp(Src) 98.2 F (36.8 C) (Oral)  Ht 5' 0.25" (1.53 m)  Wt 116 lb 1.3 oz (52.654 kg)  BMI 22.49 kg/m2  SpO2 98%  Physical Exam  Physical Exam   Constitutional: She is oriented to person, place, and time and well-developed, well-nourished, and in no distress. No distress.  HENT:  Head: Normocephalic and atraumatic.  Eyes: Conjunctivae are normal.  Neck: Neck supple. No thyromegaly present.  Cardiovascular: Normal rate, regular rhythm and normal heart sounds.   No murmur heard. Pulmonary/Chest: Effort normal and breath sounds normal. She has no wheezes.  Abdominal: She exhibits no distension and no mass.  Musculoskeletal: She exhibits no edema.  Lymphadenopathy:    She has no cervical adenopathy.  Neurological: She is alert and oriented to person, place, and time.  Skin: Skin is warm and dry. No rash noted. She is not diaphoretic.  Psychiatric: Memory, affect and judgment normal.    Lab Results  Component Value Date   TSH 1.738 02/13/2013   Lab Results  Component Value Date   WBC 6.0 02/13/2013   HGB 12.0 02/13/2013   HCT 36.3 02/13/2013   MCV 84.6 02/13/2013   PLT 260 02/13/2013   Lab Results  Component Value Date   CREATININE 1.09 02/13/2013   BUN 24* 02/13/2013   NA 144 02/13/2013   K 3.9 02/13/2013   CL 107 02/13/2013   CO2 29 02/13/2013   Lab Results  Component Value Date   ALT 10 02/13/2013   AST 17 02/13/2013   ALKPHOS 61 02/13/2013   BILITOT 1.0 02/13/2013   Lab Results  Component Value Date   CHOL 131 05/22/2012   Lab Results  Component Value Date   HDL 64.30 05/22/2012   Lab Results  Component Value Date   LDLCALC 47 05/22/2012   Lab Results  Component Value Date   TRIG 97.0 05/22/2012   Lab Results  Component Value Date   CHOLHDL 2 05/22/2012     Assessment & Plan  Hypertension Adequately controlled, no changes.  Diabetes mellitus Sugar well controlled on Metformin  Cognitive decline Rapid, no obvious source identified. They agree to start Aricept and are asked to consider neurology referral  IBS (irritable bowel syndrome) Encouraged Benefirber bid and probiotics, consider Senna S as  needed

## 2013-03-04 DIAGNOSIS — M4716 Other spondylosis with myelopathy, lumbar region: Secondary | ICD-10-CM | POA: Diagnosis not present

## 2013-03-04 DIAGNOSIS — M5137 Other intervertebral disc degeneration, lumbosacral region: Secondary | ICD-10-CM | POA: Diagnosis not present

## 2013-03-04 DIAGNOSIS — M545 Low back pain: Secondary | ICD-10-CM | POA: Diagnosis not present

## 2013-03-04 DIAGNOSIS — M999 Biomechanical lesion, unspecified: Secondary | ICD-10-CM | POA: Diagnosis not present

## 2013-03-07 NOTE — Assessment & Plan Note (Signed)
Adequately controlled, no changes 

## 2013-03-07 NOTE — Assessment & Plan Note (Signed)
Sugar well controlled on Metformin

## 2013-03-07 NOTE — Assessment & Plan Note (Signed)
Encouraged Benefirber bid and probiotics, consider Senna S as needed

## 2013-03-07 NOTE — Assessment & Plan Note (Addendum)
Rapid, no obvious source identified. They agree to start Aricept and are asked to consider neurology referral

## 2013-03-09 ENCOUNTER — Telehealth: Payer: Self-pay

## 2013-03-09 NOTE — Telephone Encounter (Signed)
Message copied by Court Joy on Mon Mar 09, 2013  3:52 PM ------      Message from: Danise Edge A      Created: Sat Mar 07, 2013  2:52 PM       Please let her daughter know that after reviewing her chart and thinking about how rapid her decline has been I think she should let us refer her to neurology for further consideration ------

## 2013-03-09 NOTE — Telephone Encounter (Signed)
FYI:  Patients daughter states that her mother doesn't want to do anything. Patients daughter stated that she would talk to her brother but she knows her mom doesn't want to do extra things.   If anything changes pts daughter will call back and let us know.   Patients daughter stated she appreciates Dr Elby Showers help and concern but if her mom had cancer they wouldn't do anything about it.

## 2013-03-18 DIAGNOSIS — M5137 Other intervertebral disc degeneration, lumbosacral region: Secondary | ICD-10-CM | POA: Diagnosis not present

## 2013-03-18 DIAGNOSIS — M545 Low back pain: Secondary | ICD-10-CM | POA: Diagnosis not present

## 2013-03-18 DIAGNOSIS — M4716 Other spondylosis with myelopathy, lumbar region: Secondary | ICD-10-CM | POA: Diagnosis not present

## 2013-03-18 DIAGNOSIS — M999 Biomechanical lesion, unspecified: Secondary | ICD-10-CM | POA: Diagnosis not present

## 2013-04-21 ENCOUNTER — Encounter: Payer: Self-pay | Admitting: Family Medicine

## 2013-04-21 ENCOUNTER — Ambulatory Visit (INDEPENDENT_AMBULATORY_CARE_PROVIDER_SITE_OTHER): Payer: Medicare Other | Admitting: Family Medicine

## 2013-04-21 VITALS — BP 138/64 | HR 72 | Temp 98.0°F | Ht 60.25 in | Wt 113.0 lb

## 2013-04-21 DIAGNOSIS — F09 Unspecified mental disorder due to known physiological condition: Secondary | ICD-10-CM

## 2013-04-21 DIAGNOSIS — R4189 Other symptoms and signs involving cognitive functions and awareness: Secondary | ICD-10-CM

## 2013-04-21 DIAGNOSIS — H6120 Impacted cerumen, unspecified ear: Secondary | ICD-10-CM | POA: Diagnosis not present

## 2013-04-21 DIAGNOSIS — H6123 Impacted cerumen, bilateral: Secondary | ICD-10-CM

## 2013-04-21 DIAGNOSIS — I10 Essential (primary) hypertension: Secondary | ICD-10-CM

## 2013-04-21 DIAGNOSIS — R413 Other amnesia: Secondary | ICD-10-CM | POA: Diagnosis not present

## 2013-04-21 MED ORDER — METFORMIN HCL ER 500 MG PO TB24: 500.0000 mg | ORAL_TABLET | Freq: Every day | ORAL | Status: DC

## 2013-04-21 MED ORDER — ATORVASTATIN CALCIUM 10 MG PO TABS: 10.0000 mg | ORAL_TABLET | Freq: Every day | ORAL | Status: DC

## 2013-04-21 MED ORDER — NEOMYCIN-POLYMYXIN-HC 3.5-10000-1 OT SOLN: 3.0000 [drp] | Freq: Three times a day (TID) | OTIC | Status: DC

## 2013-04-21 MED ORDER — DONEPEZIL HCL 10 MG PO TABS: 90.0000 mg | ORAL_TABLET | Freq: Every day | ORAL | Status: DC

## 2013-04-21 NOTE — Assessment & Plan Note (Signed)
Well controlled, no changes 

## 2013-04-21 NOTE — Patient Instructions (Signed)
Otitis Externa Otitis externa is a bacterial or fungal infection of the outer ear canal. This is the area from the eardrum to the outside of the ear. Otitis externa is sometimes called "swimmer's ear." CAUSES  Possible causes of infection include:  Swimming in dirty water.  Moisture remaining in the ear after swimming or bathing.  Mild injury (trauma) to the ear.  Objects stuck in the ear (foreign body).  Cuts or scrapes (abrasions) on the outside of the ear. SYMPTOMS  The first symptom of infection is often itching in the ear canal. Later signs and symptoms may include swelling and redness of the ear canal, ear pain, and yellowish-white fluid (pus) coming from the ear. The ear pain may be worse when pulling on the earlobe. DIAGNOSIS  Your caregiver will perform a physical exam. A sample of fluid may be taken from the ear and examined for bacteria or fungi. TREATMENT  Antibiotic ear drops are often given for 10 to 14 days. Treatment may also include pain medicine or corticosteroids to reduce itching and swelling. PREVENTION   Keep your ear dry. Use the corner of a towel to absorb water out of the ear canal after swimming or bathing.  Avoid scratching or putting objects inside your ear. This can damage the ear canal or remove the protective wax that lines the canal. This makes it easier for bacteria and fungi to grow.  Avoid swimming in lakes, polluted water, or poorly chlorinated pools.  You may use ear drops made of rubbing alcohol and vinegar after swimming. Combine equal parts of white vinegar and alcohol in a bottle. Put 3 or 4 drops into each ear after swimming. HOME CARE INSTRUCTIONS   Apply antibiotic ear drops to the ear canal as prescribed by your caregiver.  Only take over-the-counter or prescription medicines for pain, discomfort, or fever as directed by your caregiver.  If you have diabetes, follow any additional treatment instructions from your caregiver.  Keep all  follow-up appointments as directed by your caregiver. SEEK MEDICAL CARE IF:   You have a fever.  Your ear is still red, swollen, painful, or draining pus after 3 days.  Your redness, swelling, or pain gets worse.  You have a severe headache.  You have redness, swelling, pain, or tenderness in the area behind your ear. MAKE SURE YOU:   Understand these instructions.  Will watch your condition.  Will get help right away if you are not doing well or get worse. Document Released: 05/14/2005 Document Revised: 08/06/2011 Document Reviewed: 05/31/2011 [employer] Patient Information 2014 [employer].  

## 2013-04-21 NOTE — Progress Notes (Signed)
Patient ID: Briana Thornton, female   DOB: March 17, 1927, 79 y.o.   MRN: [id number] Shiryl Ruddy [id number] 1927/04/11 04/21/2013      Progress Note-Follow Up  Subjective  Chief Complaint  Chief Complaint  Patient presents with  . Follow-up    HPI  Patient is an 79 year old Caucasian female who is in today for followup. Previously been bothering her somewhat with Lasix and she's had some mild discomfort on the left. She's also been complaining of a mild sense of feeling unsteady upon arising. She has no true vertigo or spinning feelings she's had no falls. She denies headaches or other neurologic complaints. Her daughter is with her and reports otherwise she seems to be doing well. No complaints of chest pain or palpitations. She did suffer with some slight constipation at times but with suppositories able to move her bowels every couple of days. Probiotics and fiber have been helpful. No other acute illness or fevers is noted   Past Medical History  Diagnosis Date  . Chicken pox as a child  . Measles as a child  . IBS (irritable bowel syndrome)   . Diabetes mellitus 47    type 2  . Hyperlipidemia 72  . Hypertension 72  . Vision loss of right eye   . Vertigo 2010    benign  . Calcification of cartilage     ear  . Cardiovascular system disease 07/02/2011  . Hemorrhoid 07/02/2011  . Osteoporosis 07/02/2011  . Weight loss 07/31/2011  . Anemia 05/22/2012  . Broken wrist 10-22-11    left  . Depression 05/22/2012  . Dermatitis 05/22/2012    Right neck    Past Surgical History  Procedure Laterality Date  . Hemorroidectomy  1979  . Knee scoped  2002    right  . Rotator cuff repair  2004    right  . Abdominal hysterectomy  2008    partial still has ovaries  . Lens implant left  2006  . Pacemaker insertion  2010  . Wrist surgery  11-05-11    left wrist    Family History  Problem Relation Age of Onset  . Diabetes Mother     type 2  . Hypertension Mother   . Cancer Brother      lung-smoker  . Diabetes Daughter     pre diabetic  . Diabetes Son     type 2  . Cancer Maternal Grandfather     prostate    History   Social History  . Marital Status: Widowed    Spouse Name: N/A    Number of Children: N/A  . Years of Education: N/A   Occupational History  . Not on file.   Social History Main Topics  . Smoking status: Never Smoker   . Smokeless tobacco: Never Used  . Alcohol Use: No  . Drug Use: No  . Sexual Activity: Not on file   Other Topics Concern  . Not on file   Social History Narrative  . No narrative on file    Current Outpatient Prescriptions on File Prior to Visit  Medication Sig Dispense Refill  . glucose blood (FREESTYLE LITE) test strip Use as directed  100 each  2   No current facility-administered medications on file prior to visit.    No Known Allergies  Review of Systems  See below  Physical Exam  Constitutional: She is oriented to person, place, and time and well-developed, well-nourished, and in no distress. No distress.  HENT:  Head:  Normocephalic and atraumatic.  Cerumen in b/l canals, disimpacted. Left external canal mildly erythematous and skin broken down  Eyes: Conjunctivae are normal.  Neck: Neck supple. No thyromegaly present.  Cardiovascular: Normal rate, regular rhythm and normal heart sounds.   Pulmonary/Chest: Effort normal and breath sounds normal. She has no wheezes.  Abdominal: She exhibits no distension and no mass.  Musculoskeletal: She exhibits no edema.  Lymphadenopathy:    She has no cervical adenopathy.  Neurological: She is alert and oriented to person, place, and time.  Skin: Skin is warm and dry. No rash noted. She is not diaphoretic.  Psychiatric: Memory, affect and judgment normal.    Objective  BP 138/64  Pulse 72  Temp(Src) 98 F (36.7 C) (Oral)  Ht 5' 0.25" (1.53 m)  Wt 113 lb 0.6 oz (51.275 kg)  BMI 21.90 kg/m2  SpO2 97%  Physical Exam  See above  Review of Systems   Constitutional: Negative for fever and malaise/fatigue.  HENT: Positive for ear pain. Negative for congestion.        Left ear mildly uncomfortable  Eyes: Negative for discharge.  Respiratory: Negative for shortness of breath.   Cardiovascular: Negative for chest pain, palpitations and leg swelling.  Gastrointestinal: Negative for nausea, abdominal pain and diarrhea.  Genitourinary: Negative for dysuria.  Musculoskeletal: Negative for falls.  Skin: Negative for rash.  Neurological: Negative for loss of consciousness and headaches.  Endo/Heme/Allergies: Negative for polydipsia.  Psychiatric/Behavioral: Negative for depression and suicidal ideas. The patient is not nervous/anxious and does not have insomnia.     Lab Results  Component Value Date   TSH 1.738 02/13/2013   Lab Results  Component Value Date   WBC 6.0 02/13/2013   HGB 12.0 02/13/2013   HCT 36.3 02/13/2013   MCV 84.6 02/13/2013   PLT 260 02/13/2013   Lab Results  Component Value Date   CREATININE 1.09 02/13/2013   BUN 24* 02/13/2013   NA 144 02/13/2013   K 3.9 02/13/2013   CL 107 02/13/2013   CO2 29 02/13/2013   Lab Results  Component Value Date   ALT 10 02/13/2013   AST 17 02/13/2013   ALKPHOS 61 02/13/2013   BILITOT 1.0 02/13/2013   Lab Results  Component Value Date   CHOL 131 05/22/2012   Lab Results  Component Value Date   HDL 64.30 05/22/2012   Lab Results  Component Value Date   LDLCALC 47 05/22/2012   Lab Results  Component Value Date   TRIG 97.0 05/22/2012   Lab Results  Component Value Date   CHOLHDL 2 05/22/2012     Assessment & Plan   Hypertension Well controlled, no changes  Diabetes mellitus Given refill on metformin, her daughter checks her sugar routinely, well controlled  Cerumen impaction With OE, given Cortisporin otic drops to use prn  Cognitive decline Tolerating Aricept given refills

## 2013-04-21 NOTE — Assessment & Plan Note (Signed)
Tolerating Aricept given refills

## 2013-04-21 NOTE — Assessment & Plan Note (Signed)
With OE, given Cortisporin otic drops to use prn

## 2013-04-21 NOTE — Assessment & Plan Note (Signed)
Given refill on metformin, her daughter checks her sugar routinely, well controlled

## 2013-04-21 NOTE — Progress Notes (Signed)
Pre visit review using our clinic review tool, if applicable. No additional management support is needed unless otherwise documented below in the visit note. 

## 2013-04-27 ENCOUNTER — Encounter: Payer: Medicare Other | Admitting: *Deleted

## 2013-04-28 ENCOUNTER — Telehealth: Payer: Self-pay

## 2013-04-28 NOTE — Telephone Encounter (Signed)
Aricept changed in chart to 10 mg daily instead of 90 mg daily

## 2013-04-30 DIAGNOSIS — M999 Biomechanical lesion, unspecified: Secondary | ICD-10-CM | POA: Diagnosis not present

## 2013-04-30 DIAGNOSIS — M5137 Other intervertebral disc degeneration, lumbosacral region: Secondary | ICD-10-CM | POA: Diagnosis not present

## 2013-04-30 DIAGNOSIS — M545 Low back pain: Secondary | ICD-10-CM | POA: Diagnosis not present

## 2013-04-30 DIAGNOSIS — M4716 Other spondylosis with myelopathy, lumbar region: Secondary | ICD-10-CM | POA: Diagnosis not present

## 2013-05-05 ENCOUNTER — Encounter: Payer: Self-pay | Admitting: *Deleted

## 2013-05-13 ENCOUNTER — Telehealth: Payer: Self-pay | Admitting: Family Medicine

## 2013-05-13 MED ORDER — OSELTAMIVIR PHOSPHATE 75 MG PO CAPS: 75.0000 mg | ORAL_CAPSULE | Freq: Every day | ORAL | Status: DC

## 2013-05-13 NOTE — Telephone Encounter (Signed)
Do not send to [facility], please send to [facility] in [place]

## 2013-05-13 NOTE — Telephone Encounter (Signed)
OK to send in Tamiflu 75 mg po daily x 5 days, to [facility] as patient requested.

## 2013-05-13 NOTE — Telephone Encounter (Signed)
Rx sent, notified pt's daughter.

## 2013-05-13 NOTE — Telephone Encounter (Signed)
Grandson tested positive for flu, requesting Tamiflu be called into [facility]

## 2013-06-12 ENCOUNTER — Other Ambulatory Visit: Payer: Self-pay | Admitting: Family Medicine

## 2013-07-20 ENCOUNTER — Other Ambulatory Visit: Payer: Self-pay

## 2013-07-20 NOTE — Telephone Encounter (Signed)
pts daughter left a message stating that her mom needs her Lipitor and Metformin refilled.  Last RX for both was on 04-21-13 quantity 90 with 3 refills  Kim at [facility] states that there are refills available for both of these medications.  I left a detailed vm on [name]'s vm

## 2013-07-24 ENCOUNTER — Emergency Department (HOSPITAL_COMMUNITY): Payer: Medicare Other

## 2013-07-24 ENCOUNTER — Encounter (HOSPITAL_COMMUNITY)
Admission: EM | Disposition: A | Discharge status: Skilled Nursing Facility | Payer: Medicare Other | Source: Home / Self Care | Attending: Internal Medicine

## 2013-07-24 ENCOUNTER — Inpatient Hospital Stay (HOSPITAL_COMMUNITY)
Admission: EM | Admit: 2013-07-24 | Discharge: 2013-07-27 | DRG: 481 | Disposition: A | Discharge status: Skilled Nursing Facility | Payer: Medicare Other | Attending: Internal Medicine | Admitting: Internal Medicine

## 2013-07-24 ENCOUNTER — Inpatient Hospital Stay (HOSPITAL_COMMUNITY): Payer: Medicare Other

## 2013-07-24 ENCOUNTER — Encounter (HOSPITAL_COMMUNITY): Payer: Medicare Other | Admitting: Anesthesiology

## 2013-07-24 ENCOUNTER — Encounter (HOSPITAL_COMMUNITY): Payer: Self-pay | Admitting: Emergency Medicine

## 2013-07-24 ENCOUNTER — Inpatient Hospital Stay (HOSPITAL_COMMUNITY): Payer: Medicare Other | Admitting: Anesthesiology

## 2013-07-24 DIAGNOSIS — Z8249 Family history of ischemic heart disease and other diseases of the circulatory system: Secondary | ICD-10-CM | POA: Diagnosis not present

## 2013-07-24 DIAGNOSIS — Z950 Presence of cardiac pacemaker: Secondary | ICD-10-CM | POA: Diagnosis present

## 2013-07-24 DIAGNOSIS — N390 Urinary tract infection, site not specified: Secondary | ICD-10-CM | POA: Diagnosis present

## 2013-07-24 DIAGNOSIS — S72143A Displaced intertrochanteric fracture of unspecified femur, initial encounter for closed fracture: Secondary | ICD-10-CM | POA: Diagnosis not present

## 2013-07-24 DIAGNOSIS — E0789 Other specified disorders of thyroid: Secondary | ICD-10-CM | POA: Diagnosis present

## 2013-07-24 DIAGNOSIS — D62 Acute posthemorrhagic anemia: Secondary | ICD-10-CM | POA: Diagnosis not present

## 2013-07-24 DIAGNOSIS — E119 Type 2 diabetes mellitus without complications: Secondary | ICD-10-CM | POA: Diagnosis present

## 2013-07-24 DIAGNOSIS — M25559 Pain in unspecified hip: Secondary | ICD-10-CM | POA: Diagnosis not present

## 2013-07-24 DIAGNOSIS — K589 Irritable bowel syndrome without diarrhea: Secondary | ICD-10-CM | POA: Diagnosis present

## 2013-07-24 DIAGNOSIS — W19XXXA Unspecified fall, initial encounter: Secondary | ICD-10-CM

## 2013-07-24 DIAGNOSIS — Z01818 Encounter for other preprocedural examination: Secondary | ICD-10-CM | POA: Diagnosis not present

## 2013-07-24 DIAGNOSIS — S199XXA Unspecified injury of neck, initial encounter: Secondary | ICD-10-CM | POA: Diagnosis not present

## 2013-07-24 DIAGNOSIS — A498 Other bacterial infections of unspecified site: Secondary | ICD-10-CM | POA: Diagnosis present

## 2013-07-24 DIAGNOSIS — Z9181 History of falling: Secondary | ICD-10-CM | POA: Diagnosis not present

## 2013-07-24 DIAGNOSIS — D497 Neoplasm of unspecified behavior of endocrine glands and other parts of nervous system: Secondary | ICD-10-CM

## 2013-07-24 DIAGNOSIS — M6281 Muscle weakness (generalized): Secondary | ICD-10-CM | POA: Diagnosis not present

## 2013-07-24 DIAGNOSIS — F329 Major depressive disorder, single episode, unspecified: Secondary | ICD-10-CM | POA: Diagnosis present

## 2013-07-24 DIAGNOSIS — Z791 Long term (current) use of non-steroidal anti-inflammatories (NSAID): Secondary | ICD-10-CM

## 2013-07-24 DIAGNOSIS — M79609 Pain in unspecified limb: Secondary | ICD-10-CM | POA: Diagnosis not present

## 2013-07-24 DIAGNOSIS — F0280 Dementia in other diseases classified elsewhere without behavioral disturbance: Secondary | ICD-10-CM | POA: Diagnosis not present

## 2013-07-24 DIAGNOSIS — S72009D Fracture of unspecified part of neck of unspecified femur, subsequent encounter for closed fracture with routine healing: Secondary | ICD-10-CM | POA: Diagnosis not present

## 2013-07-24 DIAGNOSIS — R269 Unspecified abnormalities of gait and mobility: Secondary | ICD-10-CM | POA: Diagnosis not present

## 2013-07-24 DIAGNOSIS — E079 Disorder of thyroid, unspecified: Secondary | ICD-10-CM | POA: Diagnosis not present

## 2013-07-24 DIAGNOSIS — S72001A Fracture of unspecified part of neck of right femur, initial encounter for closed fracture: Secondary | ICD-10-CM

## 2013-07-24 DIAGNOSIS — S0993XA Unspecified injury of face, initial encounter: Secondary | ICD-10-CM | POA: Diagnosis not present

## 2013-07-24 DIAGNOSIS — E042 Nontoxic multinodular goiter: Secondary | ICD-10-CM | POA: Diagnosis not present

## 2013-07-24 DIAGNOSIS — E785 Hyperlipidemia, unspecified: Secondary | ICD-10-CM | POA: Diagnosis present

## 2013-07-24 DIAGNOSIS — R296 Repeated falls: Secondary | ICD-10-CM | POA: Diagnosis present

## 2013-07-24 DIAGNOSIS — R279 Unspecified lack of coordination: Secondary | ICD-10-CM | POA: Diagnosis not present

## 2013-07-24 DIAGNOSIS — H5440 Blindness, one eye, unspecified eye: Secondary | ICD-10-CM | POA: Diagnosis not present

## 2013-07-24 DIAGNOSIS — Z4789 Encounter for other orthopedic aftercare: Secondary | ICD-10-CM | POA: Diagnosis not present

## 2013-07-24 DIAGNOSIS — I10 Essential (primary) hypertension: Secondary | ICD-10-CM | POA: Diagnosis present

## 2013-07-24 DIAGNOSIS — S0990XA Unspecified injury of head, initial encounter: Secondary | ICD-10-CM | POA: Diagnosis not present

## 2013-07-24 DIAGNOSIS — Z833 Family history of diabetes mellitus: Secondary | ICD-10-CM

## 2013-07-24 DIAGNOSIS — S72009A Fracture of unspecified part of neck of unspecified femur, initial encounter for closed fracture: Secondary | ICD-10-CM | POA: Diagnosis not present

## 2013-07-24 DIAGNOSIS — Z66 Do not resuscitate: Secondary | ICD-10-CM | POA: Diagnosis present

## 2013-07-24 DIAGNOSIS — M810 Age-related osteoporosis without current pathological fracture: Secondary | ICD-10-CM | POA: Diagnosis present

## 2013-07-24 DIAGNOSIS — H5460 Unqualified visual loss, one eye, unspecified: Secondary | ICD-10-CM | POA: Diagnosis present

## 2013-07-24 DIAGNOSIS — Y92009 Unspecified place in unspecified non-institutional (private) residence as the place of occurrence of the external cause: Secondary | ICD-10-CM

## 2013-07-24 DIAGNOSIS — E041 Nontoxic single thyroid nodule: Secondary | ICD-10-CM | POA: Diagnosis present

## 2013-07-24 DIAGNOSIS — Z79899 Other long term (current) drug therapy: Secondary | ICD-10-CM

## 2013-07-24 DIAGNOSIS — F3289 Other specified depressive episodes: Secondary | ICD-10-CM | POA: Diagnosis present

## 2013-07-24 HISTORY — PX: INTRAMEDULLARY (IM) NAIL INTERTROCHANTERIC: SHX5875

## 2013-07-24 LAB — BASIC METABOLIC PANEL
BUN: 21 mg/dL (ref 6–23)
CO2: 27 mEq/L (ref 19–32)
Calcium: 9.7 mg/dL (ref 8.4–10.5)
Chloride: 104 mEq/L (ref 96–112)
Creatinine, Ser: 0.97 mg/dL (ref 0.50–1.10)
GFR calc Af Amer: 60 mL/min — ABNORMAL LOW (ref >90)
GFR calc non Af Amer: 52 mL/min — ABNORMAL LOW (ref >90)
GLUCOSE: 132 mg/dL — AB (ref 70–99)
Potassium: 3.9 mEq/L (ref 3.7–5.3)
Sodium: 142 mEq/L (ref 137–147)

## 2013-07-24 LAB — URINALYSIS, ROUTINE W REFLEX MICROSCOPIC
BILIRUBIN URINE: NEGATIVE
Glucose, UA: NEGATIVE mg/dL
Ketones, ur: NEGATIVE mg/dL
NITRITE: POSITIVE — AB
Protein, ur: NEGATIVE mg/dL
SPECIFIC GRAVITY, URINE: 1.015 (ref 1.005–1.030)
Urobilinogen, UA: 0.2 mg/dL (ref 0.0–1.0)
pH: 6 (ref 5.0–8.0)

## 2013-07-24 LAB — CBC WITH DIFFERENTIAL/PLATELET
Basophils Absolute: 0 10*3/uL (ref 0.0–0.1)
Basophils Relative: 0 % (ref 0–1)
EOS ABS: 0 10*3/uL (ref 0.0–0.7)
EOS PCT: 0 % (ref 0–5)
HCT: 36.5 % (ref 36.0–46.0)
HEMOGLOBIN: 11.8 g/dL — AB (ref 12.0–15.0)
Lymphocytes Relative: 10 % — ABNORMAL LOW (ref 12–46)
Lymphs Abs: 1 10*3/uL (ref 0.7–4.0)
MCH: 28.6 pg (ref 26.0–34.0)
MCHC: 32.3 g/dL (ref 30.0–36.0)
MCV: 88.6 fL (ref 78.0–100.0)
MONOS PCT: 5 % (ref 3–12)
Monocytes Absolute: 0.5 10*3/uL (ref 0.1–1.0)
Neutro Abs: 8.7 10*3/uL — ABNORMAL HIGH (ref 1.7–7.7)
Neutrophils Relative %: 85 % — ABNORMAL HIGH (ref 43–77)
Platelets: 252 10*3/uL (ref 150–400)
RBC: 4.12 MIL/uL (ref 3.87–5.11)
RDW: 14.1 % (ref 11.5–15.5)
WBC: 10.3 10*3/uL (ref 4.0–10.5)

## 2013-07-24 LAB — URINE MICROSCOPIC-ADD ON

## 2013-07-24 LAB — PROTIME-INR
INR: 1.02 (ref 0.00–1.49)
Prothrombin Time: 13.2 seconds (ref 11.6–15.2)

## 2013-07-24 LAB — ABO/RH: ABO/RH(D): B POS

## 2013-07-24 LAB — GLUCOSE, CAPILLARY
Glucose-Capillary: 121 mg/dL — ABNORMAL HIGH (ref 70–99)
Glucose-Capillary: 158 mg/dL — ABNORMAL HIGH (ref 70–99)

## 2013-07-24 LAB — SURGICAL PCR SCREEN
MRSA, PCR: NEGATIVE
Staphylococcus aureus: NEGATIVE

## 2013-07-24 SURGERY — FIXATION, FRACTURE, INTERTROCHANTERIC, WITH INTRAMEDULLARY ROD
Anesthesia: General | Laterality: Right

## 2013-07-24 MED ORDER — HYDROMORPHONE HCL PF 1 MG/ML IJ SOLN
INTRAMUSCULAR | Status: AC
  Filled 2013-07-24: qty 1

## 2013-07-24 MED ORDER — MORPHINE SULFATE 2 MG/ML IJ SOLN: 0.5000 mg | INTRAMUSCULAR | Status: DC | PRN

## 2013-07-24 MED ORDER — LIDOCAINE HCL (CARDIAC) 20 MG/ML IV SOLN
INTRAVENOUS | Status: DC | PRN
Start: 2013-07-24 — End: 2013-07-24
  Administered 2013-07-24: 30 mg via INTRAVENOUS

## 2013-07-24 MED ORDER — ONDANSETRON HCL 4 MG/2ML IJ SOLN
INTRAMUSCULAR | Status: DC | PRN
  Administered 2013-07-24: 4 mg via INTRAVENOUS

## 2013-07-24 MED ORDER — MORPHINE SULFATE 2 MG/ML IJ SOLN: 2.0000 mg | INTRAMUSCULAR | Status: DC | PRN

## 2013-07-24 MED ORDER — ONDANSETRON HCL 4 MG/2ML IJ SOLN: 4.0000 mg | Freq: Four times a day (QID) | INTRAMUSCULAR | Status: DC | PRN

## 2013-07-24 MED ORDER — ONDANSETRON HCL 4 MG PO TABS: 4.0000 mg | ORAL_TABLET | Freq: Four times a day (QID) | ORAL | Status: DC | PRN

## 2013-07-24 MED ORDER — SODIUM CHLORIDE 0.9 % IV SOLN
1000.0000 mL | INTRAVENOUS | Status: DC
  Administered 2013-07-24: 1000 mL via INTRAVENOUS

## 2013-07-24 MED ORDER — HYDROCODONE-ACETAMINOPHEN 5-325 MG PO TABS
1.0000 | ORAL_TABLET | Freq: Four times a day (QID) | ORAL | Status: DC | PRN
Start: 2013-07-24 — End: 2013-07-27

## 2013-07-24 MED ORDER — LACTATED RINGERS IV SOLN
INTRAVENOUS | Status: DC | PRN
  Administered 2013-07-24: 17:00:00 via INTRAVENOUS

## 2013-07-24 MED ORDER — DONEPEZIL HCL 10 MG PO TABS
10.0000 mg | ORAL_TABLET | Freq: Every day | ORAL | Status: DC
  Administered 2013-07-25 – 2013-07-27 (×3): 10 mg via ORAL
  Filled 2013-07-24 (×4): qty 1

## 2013-07-24 MED ORDER — ENOXAPARIN SODIUM 40 MG/0.4ML ~~LOC~~ SOLN
40.0000 mg | SUBCUTANEOUS | Status: DC
  Administered 2013-07-25 – 2013-07-26 (×2): 40 mg via SUBCUTANEOUS
  Filled 2013-07-24 (×3): qty 0.4

## 2013-07-24 MED ORDER — FLEET ENEMA 7-19 GM/118ML RE ENEM: 1.0000 | ENEMA | Freq: Once | RECTAL | Status: AC | PRN

## 2013-07-24 MED ORDER — MENTHOL 3 MG MT LOZG
1.0000 | LOZENGE | OROMUCOSAL | Status: DC | PRN
  Filled 2013-07-24: qty 9

## 2013-07-24 MED ORDER — PROPOFOL 10 MG/ML IV BOLUS
INTRAVENOUS | Status: DC | PRN
  Administered 2013-07-24: 70 mg via INTRAVENOUS

## 2013-07-24 MED ORDER — CEFAZOLIN SODIUM-DEXTROSE 2-3 GM-% IV SOLR
2.0000 g | Freq: Four times a day (QID) | INTRAVENOUS | Status: AC
  Administered 2013-07-24 – 2013-07-25 (×2): 2 g via INTRAVENOUS
  Filled 2013-07-24 (×2): qty 50

## 2013-07-24 MED ORDER — ACETAMINOPHEN 325 MG PO TABS
650.0000 mg | ORAL_TABLET | Freq: Four times a day (QID) | ORAL | Status: DC | PRN
Start: 2013-07-24 — End: 2013-07-24

## 2013-07-24 MED ORDER — SODIUM CHLORIDE 0.9 % IV SOLN
INTRAVENOUS | Status: DC
  Administered 2013-07-24: 22:00:00 via INTRAVENOUS

## 2013-07-24 MED ORDER — INSULIN ASPART 100 UNIT/ML ~~LOC~~ SOLN: 0.0000 [IU] | SUBCUTANEOUS | Status: DC

## 2013-07-24 MED ORDER — POLYETHYLENE GLYCOL 3350 17 G PO PACK: 17.0000 g | PACK | Freq: Every day | ORAL | Status: DC | PRN

## 2013-07-24 MED ORDER — ADULT MULTIVITAMIN W/MINERALS CH
1.0000 | ORAL_TABLET | Freq: Every day | ORAL | Status: DC
  Administered 2013-07-25 – 2013-07-27 (×3): 1 via ORAL
  Filled 2013-07-24 (×4): qty 1

## 2013-07-24 MED ORDER — ACETAMINOPHEN 650 MG RE SUPP: 650.0000 mg | Freq: Four times a day (QID) | RECTAL | Status: DC | PRN

## 2013-07-24 MED ORDER — BISACODYL 10 MG RE SUPP
10.0000 mg | Freq: Every day | RECTAL | Status: DC | PRN
  Administered 2013-07-26: 10 mg via RECTAL
  Filled 2013-07-24: qty 1

## 2013-07-24 MED ORDER — METOCLOPRAMIDE HCL 10 MG PO TABS
5.0000 mg | ORAL_TABLET | Freq: Three times a day (TID) | ORAL | Status: DC | PRN
Start: 2013-07-24 — End: 2013-07-26

## 2013-07-24 MED ORDER — LIDOCAINE HCL (CARDIAC) 20 MG/ML IV SOLN
INTRAVENOUS | Status: AC
  Filled 2013-07-24: qty 5

## 2013-07-24 MED ORDER — PHENYLEPHRINE HCL 10 MG/ML IJ SOLN
INTRAMUSCULAR | Status: DC | PRN
  Administered 2013-07-24: 40 ug via INTRAVENOUS

## 2013-07-24 MED ORDER — FENTANYL CITRATE 0.05 MG/ML IJ SOLN
INTRAMUSCULAR | Status: AC
  Filled 2013-07-24: qty 2

## 2013-07-24 MED ORDER — SUCCINYLCHOLINE CHLORIDE 20 MG/ML IJ SOLN
INTRAMUSCULAR | Status: DC | PRN
  Administered 2013-07-24: 60 mg via INTRAVENOUS

## 2013-07-24 MED ORDER — ATORVASTATIN CALCIUM 10 MG PO TABS
10.0000 mg | ORAL_TABLET | Freq: Every day | ORAL | Status: DC
  Administered 2013-07-25 – 2013-07-26 (×2): 10 mg via ORAL
  Filled 2013-07-24 (×3): qty 1

## 2013-07-24 MED ORDER — PHENYLEPHRINE 40 MCG/ML (10ML) SYRINGE FOR IV PUSH (FOR BLOOD PRESSURE SUPPORT)
PREFILLED_SYRINGE | INTRAVENOUS | Status: AC
  Filled 2013-07-24: qty 10

## 2013-07-24 MED ORDER — SODIUM CHLORIDE 0.9 % IV SOLN: INTRAVENOUS | Status: DC

## 2013-07-24 MED ORDER — LACTATED RINGERS IV SOLN: INTRAVENOUS | Status: DC

## 2013-07-24 MED ORDER — DOCUSATE SODIUM 100 MG PO CAPS
100.0000 mg | ORAL_CAPSULE | Freq: Two times a day (BID) | ORAL | Status: DC
  Administered 2013-07-24 – 2013-07-27 (×5): 100 mg via ORAL

## 2013-07-24 MED ORDER — HYDROMORPHONE HCL PF 1 MG/ML IJ SOLN
0.2500 mg | INTRAMUSCULAR | Status: DC | PRN
  Administered 2013-07-24: 0.5 mg via INTRAVENOUS
  Administered 2013-07-24 (×2): 0.25 mg via INTRAVENOUS

## 2013-07-24 MED ORDER — METHOCARBAMOL 100 MG/ML IJ SOLN
500.0000 mg | Freq: Four times a day (QID) | INTRAVENOUS | Status: DC | PRN
  Administered 2013-07-24: 500 mg via INTRAVENOUS
  Filled 2013-07-24: qty 5

## 2013-07-24 MED ORDER — POLYVINYL ALCOHOL 1.4 % OP SOLN
1.0000 [drp] | OPHTHALMIC | Status: DC | PRN
  Filled 2013-07-24: qty 15

## 2013-07-24 MED ORDER — ADULT MULTIVITAMIN W/MINERALS CH: 2.0000 | ORAL_TABLET | Freq: Every day | ORAL | Status: DC

## 2013-07-24 MED ORDER — PHENOL 1.4 % MT LIQD
1.0000 | OROMUCOSAL | Status: DC | PRN
  Filled 2013-07-24: qty 177

## 2013-07-24 MED ORDER — ACETAMINOPHEN 325 MG PO TABS
650.0000 mg | ORAL_TABLET | Freq: Four times a day (QID) | ORAL | Status: DC | PRN
  Administered 2013-07-26: 650 mg via ORAL
  Filled 2013-07-24: qty 2

## 2013-07-24 MED ORDER — OXYCODONE HCL 5 MG PO TABS: 5.0000 mg | ORAL_TABLET | ORAL | Status: DC | PRN

## 2013-07-24 MED ORDER — TRAMADOL HCL 50 MG PO TABS
50.0000 mg | ORAL_TABLET | Freq: Four times a day (QID) | ORAL | Status: DC | PRN
  Administered 2013-07-25 – 2013-07-26 (×3): 50 mg via ORAL
  Filled 2013-07-24 (×3): qty 1

## 2013-07-24 MED ORDER — DEXTROSE 5 % IV SOLN
1.0000 g | INTRAVENOUS | Status: DC
  Filled 2013-07-24: qty 10

## 2013-07-24 MED ORDER — FENTANYL CITRATE 0.05 MG/ML IJ SOLN
INTRAMUSCULAR | Status: DC | PRN
  Administered 2013-07-24 (×3): 25 ug via INTRAVENOUS

## 2013-07-24 MED ORDER — METOCLOPRAMIDE HCL 5 MG/ML IJ SOLN
5.0000 mg | Freq: Three times a day (TID) | INTRAMUSCULAR | Status: DC | PRN
Start: 2013-07-24 — End: 2013-07-26

## 2013-07-24 MED ORDER — DEXTROSE 5 % IV SOLN
1.0000 g | Freq: Once | INTRAVENOUS | Status: AC
  Administered 2013-07-24: 1 g via INTRAVENOUS
  Filled 2013-07-24: qty 10

## 2013-07-24 MED ORDER — METHOCARBAMOL 500 MG PO TABS: 500.0000 mg | ORAL_TABLET | Freq: Four times a day (QID) | ORAL | Status: DC | PRN

## 2013-07-24 MED ORDER — ONDANSETRON HCL 4 MG PO TABS
4.0000 mg | ORAL_TABLET | Freq: Four times a day (QID) | ORAL | Status: DC | PRN
Start: 2013-07-24 — End: 2013-07-24

## 2013-07-24 MED ORDER — ONDANSETRON HCL 4 MG/2ML IJ SOLN
INTRAMUSCULAR | Status: AC
  Filled 2013-07-24: qty 2

## 2013-07-24 MED ORDER — ROCURONIUM BROMIDE 100 MG/10ML IV SOLN
INTRAVENOUS | Status: AC
  Filled 2013-07-24: qty 1

## 2013-07-24 MED ORDER — ALUM & MAG HYDROXIDE-SIMETH 200-200-20 MG/5ML PO SUSP: 30.0000 mL | Freq: Four times a day (QID) | ORAL | Status: DC | PRN

## 2013-07-24 MED ORDER — ACETAMINOPHEN 650 MG RE SUPP
650.0000 mg | Freq: Four times a day (QID) | RECTAL | Status: DC | PRN
Start: 2013-07-24 — End: 2013-07-24

## 2013-07-24 MED ORDER — HEPARIN SODIUM (PORCINE) 5000 UNIT/ML IJ SOLN
5000.0000 [IU] | Freq: Three times a day (TID) | INTRAMUSCULAR | Status: DC
Start: 2013-07-24 — End: 2013-07-24

## 2013-07-24 MED ORDER — PROPOFOL 10 MG/ML IV BOLUS
INTRAVENOUS | Status: AC
  Filled 2013-07-24: qty 20

## 2013-07-24 MED ORDER — METHYLCELLULOSE 1 % OP SOLN: 1.0000 [drp] | OPHTHALMIC | Status: DC | PRN

## 2013-07-24 SURGICAL SUPPLY — 34 items
BAG ZIPLOCK 12X15 (MISCELLANEOUS) IMPLANT
BIT DRILL CANN LG 4.3MM (BIT) ×1 IMPLANT
BNDG COHESIVE 6X5 TAN STRL LF (GAUZE/BANDAGES/DRESSINGS) ×3 IMPLANT
DRAPE STERI IOBAN 125X83 (DRAPES) ×3 IMPLANT
DRAPE TABLE BACK 44X90 PK DISP (DRAPES) ×3 IMPLANT
DRILL BIT CANN LG 4.3MM (BIT) ×3
DRSG MEPILEX BORDER 4X8 (GAUZE/BANDAGES/DRESSINGS) ×6 IMPLANT
DURAPREP 26ML APPLICATOR (WOUND CARE) ×3 IMPLANT
ELECT REM PT RETURN 9FT ADLT (ELECTROSURGICAL) ×3
ELECTRODE REM PT RTRN 9FT ADLT (ELECTROSURGICAL) ×1 IMPLANT
GLOVE BIO SURGEON STRL SZ7.5 (GLOVE) ×3 IMPLANT
GLOVE BIO SURGEON STRL SZ8 (GLOVE) ×18 IMPLANT
GLOVE BIOGEL PI IND STRL 8 (GLOVE) ×1 IMPLANT
GLOVE BIOGEL PI INDICATOR 8 (GLOVE) ×2
GOWN STRL REUS W/TWL LRG LVL3 (GOWN DISPOSABLE) ×6 IMPLANT
GOWN STRL REUS W/TWL XL LVL3 (GOWN DISPOSABLE) ×3 IMPLANT
GUIDEPIN 3.2X17.5 THRD DISP (PIN) ×3 IMPLANT
HFN 130 DEG 13MM X 180MM (Nail) ×3 IMPLANT
KIT BASIN OR (CUSTOM PROCEDURE TRAY) ×3 IMPLANT
MANIFOLD NEPTUNE II (INSTRUMENTS) IMPLANT
NS IRRIG 1000ML POUR BTL (IV SOLUTION) ×3 IMPLANT
PACK GENERAL/GYN (CUSTOM PROCEDURE TRAY) ×3 IMPLANT
PAD ABD 8X10 STRL (GAUZE/BANDAGES/DRESSINGS) IMPLANT
POSITIONER SURGICAL ARM (MISCELLANEOUS) ×6 IMPLANT
SCREW BONE CORTICAL 5.0X36 (Screw) ×3 IMPLANT
SCREW LAG HIP NAIL 10.5X95 (Screw) ×3 IMPLANT
SPONGE GAUZE 4X4 12PLY (GAUZE/BANDAGES/DRESSINGS) ×3 IMPLANT
SUT MNCRL AB 4-0 PS2 18 (SUTURE) ×3 IMPLANT
SUT VIC AB 0 CT1 27 (SUTURE)
SUT VIC AB 0 CT1 27XBRD ANTBC (SUTURE) IMPLANT
SUT VIC AB 2-0 CT1 27 (SUTURE) ×4
SUT VIC AB 2-0 CT1 TAPERPNT 27 (SUTURE) ×2 IMPLANT
TOWEL OR 17X26 10 PK STRL BLUE (TOWEL DISPOSABLE) ×3 IMPLANT
TRAY FOLEY CATH 14FRSI W/METER (CATHETERS) ×3 IMPLANT

## 2013-07-24 NOTE — Transfer of Care (Signed)
Immediate Anesthesia Transfer of Care Note  Patient: Briana Thornton  Procedure(s) Performed: Procedure(s): INTRAMEDULLARY (IM) NAIL INTERTROCHANTRIC (Right)  Patient Location: PACU  Anesthesia Type:General  Level of Consciousness: awake and responds to stimulation  Airway & Oxygen Therapy: Patient Spontanous Breathing and Patient connected to face mask oxygen  Post-op Assessment: Report given to PACU RN and Post -op Vital signs reviewed and stable  Post vital signs: Reviewed and stable  Complications: No apparent anesthesia complications

## 2013-07-24 NOTE — Anesthesia Preprocedure Evaluation (Addendum)
Anesthesia Evaluation  Patient identified by MRN, date of birth, ID band Patient awake    Reviewed: Allergy & Precautions, H&P , NPO status , Patient's Chart, lab work & pertinent test results  Airway Mallampati: II TM Distance: >3 FB Neck ROM: full    Dental  (+) Caps, Dental Advisory Given 4 upper front are capped:   Pulmonary neg pulmonary ROS,  breath sounds clear to auscultation  Pulmonary exam normal       Cardiovascular hypertension, Pt. on medications + pacemaker Rhythm:regular Rate:Normal     Neuro/Psych Vertigo. Mild dementia negative neurological ROS  negative psych ROS   GI/Hepatic negative GI ROS, Neg liver ROS,   Endo/Other  diabetes, Well Controlled, Type 2, Oral Hypoglycemic Agents  Renal/GU negative Renal ROS  negative genitourinary   Musculoskeletal   Abdominal   Peds  Hematology negative hematology ROS (+)   Anesthesia Other Findings   Reproductive/Obstetrics negative OB ROS                          Anesthesia Physical Anesthesia Plan  ASA: III  Anesthesia Plan: General   Post-op Pain Management:    Induction: Intravenous  Airway Management Planned: Oral ETT  Additional Equipment:   Intra-op Plan:   Post-operative Plan: Extubation in OR  Informed Consent: I have reviewed the patients History and Physical, chart, labs and discussed the procedure including the risks, benefits and alternatives for the proposed anesthesia with the patient or authorized representative who has indicated his/her understanding and acceptance.   Dental Advisory Given  Plan Discussed with: CRNA and Surgeon  Anesthesia Plan Comments:         Anesthesia Quick Evaluation

## 2013-07-24 NOTE — Consult Note (Signed)
Reason for Consult:Right intertrochanteric femur fracture Referring Physician: Dr. Dene Gentry Briana Thornton is an 80 y.o. female.  HPI: Briana Thornton is an 80 yo female who had a mechanical fall last night while getting up to go to the bathroom landing on her right side with immediate right hip pain. She did not have any dizziness, syncope, Light headedness, chest pain, shortness of breath or head trauma associated with the fall. She was put back in bed by her family and had increased pain upon waking up this morning. Her daughter, who is a Marine scientist on [employer], called EMS and Briana Thornton was taken to the ED, where evaluation showed a right intertrochanteric femur fracture. She was admitted by the medical service and we were consulted for management. The patient has no other complaints besides her hip pain.  Past Medical History  Diagnosis Date  . Chicken pox as a child  . Measles as a child  . IBS (irritable bowel syndrome)   . Diabetes mellitus 47    type 2  . Hyperlipidemia 72  . Hypertension 72  . Vision loss of right eye   . Vertigo 2010    benign  . Calcification of cartilage     ear  . Cardiovascular system disease 07/02/2011  . Hemorrhoid 07/02/2011  . Osteoporosis 07/02/2011  . Weight loss 07/31/2011  . Anemia 05/22/2012  . Broken wrist 10-22-11    left  . Depression 05/22/2012  . Dermatitis 05/22/2012    Right neck    Past Surgical History  Procedure Laterality Date  . Hemorroidectomy  1979  . Knee scoped  2002    right  . Rotator cuff repair  2004    right  . Abdominal hysterectomy  2008    partial still has ovaries  . Lens implant left  2006  . Pacemaker insertion  2010  . Wrist surgery  11-05-11    left wrist    Family History  Problem Relation Age of Onset  . Diabetes Mother     type 2  . Hypertension Mother   . Cancer Brother     lung-smoker  . Diabetes Daughter     pre diabetic  . Diabetes Son     type 2  . Cancer Maternal Grandfather     prostate     Social History:  reports that she has never smoked. She has never used smokeless tobacco. She reports that she does not drink alcohol or use illicit drugs.  Allergies: No Known Allergies  Medications: I have reviewed the patient's current medications.  Results for orders placed during the hospital encounter of 07/24/13 (from the past 48 hour(s))  URINALYSIS, ROUTINE W REFLEX MICROSCOPIC     Status: Abnormal   Collection Time    07/24/13 11:52 AM      Result Value Ref Range   Color, Urine YELLOW  YELLOW   APPearance CLEAR  CLEAR   Specific Gravity, Urine 1.015  1.005 - 1.030   pH 6.0  5.0 - 8.0   Glucose, UA NEGATIVE  NEGATIVE mg/dL   Hgb urine dipstick TRACE (*) NEGATIVE   Bilirubin Urine NEGATIVE  NEGATIVE   Ketones, ur NEGATIVE  NEGATIVE mg/dL   Protein, ur NEGATIVE  NEGATIVE mg/dL   Urobilinogen, UA 0.2  0.0 - 1.0 mg/dL   Nitrite POSITIVE (*) NEGATIVE   Leukocytes, UA SMALL (*) NEGATIVE  URINE MICROSCOPIC-ADD ON     Status: Abnormal   Collection Time    07/24/13 11:52  AM      Result Value Ref Range   Squamous Epithelial / LPF RARE  RARE   WBC, UA 11-20  <3 WBC/hpf   RBC / HPF 3-6  <3 RBC/hpf   Bacteria, UA MANY (*) RARE  BASIC METABOLIC PANEL     Status: Abnormal   Collection Time    07/24/13 12:05 PM      Result Value Ref Range   Sodium 142  137 - 147 mEq/L   Potassium 3.9  3.7 - 5.3 mEq/L   Chloride 104  96 - 112 mEq/L   CO2 27  19 - 32 mEq/L   Glucose, Bld 132 (*) 70 - 99 mg/dL   BUN 21  6 - 23 mg/dL   Creatinine, Ser 0.97  0.50 - 1.10 mg/dL   Calcium 9.7  8.4 - 10.5 mg/dL   GFR calc non Af Amer 52 (*) >90 mL/min   GFR calc Af Amer 60 (*) >90 mL/min   Comment: (NOTE)     The eGFR has been calculated using the CKD EPI equation.     This calculation has not been validated in all clinical situations.     eGFR's persistently <90 mL/min signify possible Chronic Kidney     Disease.  CBC WITH DIFFERENTIAL     Status: Abnormal   Collection Time    07/24/13  12:05 PM      Result Value Ref Range   WBC 10.3  4.0 - 10.5 K/uL   RBC 4.12  3.87 - 5.11 MIL/uL   Hemoglobin 11.8 (*) 12.0 - 15.0 g/dL   HCT 36.5  36.0 - 46.0 %   MCV 88.6  78.0 - 100.0 fL   MCH 28.6  26.0 - 34.0 pg   MCHC 32.3  30.0 - 36.0 g/dL   RDW 14.1  11.5 - 15.5 %   Platelets 252  150 - 400 K/uL   Neutrophils Relative % 85 (*) 43 - 77 %   Neutro Abs 8.7 (*) 1.7 - 7.7 K/uL   Lymphocytes Relative 10 (*) 12 - 46 %   Lymphs Abs 1.0  0.7 - 4.0 K/uL   Monocytes Relative 5  3 - 12 %   Monocytes Absolute 0.5  0.1 - 1.0 K/uL   Eosinophils Relative 0  0 - 5 %   Eosinophils Absolute 0.0  0.0 - 0.7 K/uL   Basophils Relative 0  0 - 1 %   Basophils Absolute 0.0  0.0 - 0.1 K/uL  PROTIME-INR     Status: None   Collection Time    07/24/13 12:05 PM      Result Value Ref Range   Prothrombin Time 13.2  11.6 - 15.2 seconds   INR 1.02  0.00 - 1.49  TYPE AND SCREEN     Status: None   Collection Time    07/24/13 12:05 PM      Result Value Ref Range   ABO/RH(D) B POS     Antibody Screen NEG     Sample Expiration 07/27/2013    ABO/RH     Status: None   Collection Time    07/24/13 12:05 PM      Result Value Ref Range   ABO/RH(D) B POS      Dg Chest 1 View  07/24/2013   CLINICAL DATA:  80 year old female preoperative study for hip fracture. Initial encounter. History of diabetes hypertension and pacemaker.  EXAM: CHEST - 1 VIEW  COMPARISON:  None.  FINDINGS: Portable AP  supine view at 1132 hrs. Left chest dual lead cardiac pacemaker. Somewhat low lung volumes. Mild cardiomegaly. Other mediastinal contours are within normal limits. Calcified atherosclerosis of the aorta. Suggestion also of some calcified hilar lymph nodes. No pneumothorax, pulmonary edema, pleural effusion or consolidation. Chronic left clavicle fracture. Severe lower thoracic compression fracture. Osteopenia.  IMPRESSION: 1.  No acute cardiopulmonary abnormality. 2. Mild cardiomegaly. Severe lower thoracic compression fracture.  Suggestion of previous left lung granulomatous disease.   Electronically Signed   By: Lars Pinks M.D.   On: 07/24/2013 11:48   Dg Hip Complete Right  07/24/2013   CLINICAL DATA:  Fall.  Right hip pain.  EXAM: RIGHT HIP - COMPLETE 2+ VIEW  COMPARISON:  10/20/2011  FINDINGS: There is a mildly displaced intertrochanteric fracture of the right femur with varus angulation. The femoral head remains approximated with the acetabulum. The visualized proximal left femur is grossly unremarkable. Dystrophic calcification in the right pelvis is unchanged and likely represents a uterine fibroid.  IMPRESSION: Intertrochanteric fracture of the right femur.   Electronically Signed   By: Logan Bores   On: 07/24/2013 11:51   Ct Cervical Spine Wo Contrast  07/24/2013   CLINICAL DATA:  Fall  EXAM: CT HEAD WITHOUT CONTRAST  CT CERVICAL SPINE WITHOUT CONTRAST  TECHNIQUE: Multidetector CT imaging of the head and cervical spine was performed following the standard protocol without intravenous contrast. Multiplanar CT image reconstructions of the cervical spine were also generated.  COMPARISON:  None.  FINDINGS: CT HEAD FINDINGS  Generalized atrophy. Chronic microvascular ischemic change in the white matter. Chronic lacune in the right putamen. Negative for acute infarct. Negative for hemorrhage.  18 mm ossified mass sub frontal region on the right adjacent to the falx. This may be attached to the cribriform plate and most likely is a meningioma. Adjacent frontal sinuses clear. No acute bony change. Negative for skull fracture.  CT CERVICAL SPINE FINDINGS  Negative for cervical spine fracture. Normal alignment. Mild spondylosis at C4-5, C5-6, C6-7.  Right paraesophageal soft tissue mass measures 16 mm and is similar density to thyroid. This most likely is a thyroid mass. Other possibilities would include a parathyroid adenoma and adenopathy. Thyroid overall is not enlarged. No cervical adenopathy elsewhere.  IMPRESSION: Atrophy and  chronic microvascular ischemia. 18 mm calcified sub frontal mass in the right most likely a meningioma.  No acute intracranial abnormality.  Cervical spondylosis without fracture.  Right paraesophageal soft tissue mass may represent a thyroid mass, parathyroid mass, or and enlarged lymph node.   Electronically Signed   By: Franchot Gallo M.D.   On: 07/24/2013 11:26    Review of Systems  Constitutional: Negative.   HENT: Negative.   Eyes: Negative.   Respiratory: Negative.   Cardiovascular: Negative.   Gastrointestinal: Negative.   Genitourinary: Negative.   Musculoskeletal: Positive for falls, joint pain and myalgias.  Skin: Negative.   Neurological: Negative.   Endo/Heme/Allergies: Negative.   Psychiatric/Behavioral: Negative.    Blood pressure 173/85, pulse 65, temperature 98.3 F (36.8 C), temperature source Oral, resp. rate 15, SpO2 100.00%. Physical Exam Physical Examination: General appearance - alert, well appearing, and in no distress Mental status - alert, oriented to person, place, and time Chest - clear to auscultation, no wheezes, rales or rhonchi, symmetric air entry Heart - normal rate, regular rhythm, normal S1, S2, no murmurs, rubs, clicks or gallops Abdomen - soft, nontender, nondistended, no masses or organomegaly Neurological - alert, oriented, normal speech, no focal findings or  movement disorder noted Right lower extremity shortened and externally rotated. Sensation and motor intact RLE; palpable pulses RLE   Assessment/Plan: Right intertrochanteric femur fracture- Plan operative fixation with IM nail. Discussed procedure, risks and potential complications with the patient and her daughter and they elect to proceed.  Gearlean Alf 07/24/2013, 4:10 PM

## 2013-07-24 NOTE — ED Notes (Signed)
Patient from home fell while going to the bathroom early this morning.  No loss of conciousness, c/o right hip and thigh pain.   Shortening and outer rotation present.  Pulses present, per EMS, color good and movement present.

## 2013-07-24 NOTE — ED Notes (Signed)
Pt. Has went to xray and is unable to obtain EKG at this time.

## 2013-07-24 NOTE — ED Provider Notes (Signed)
TIME SEEN: 10:50 AM  CHIEF COMPLAINT: Fall, right hip pain  HPI: Patient is an 80 year old female with a history of hypertension, hyperlipidemia, non-insulin-dependent diabetes, sick sinus syndrome status post pacemaker placement who lives at home with her daughter who is an orthopedic nurse who had a mechanical fall at approximately 2 AM this morning. Patient reports that she was getting out of bed to the bathroom using her walker when she lost her balance and fell backwards. She did strike her head at the edge of her bed but did not lose consciousness. She is not on anticoagulation. She was unable to ambulate afterwards but there was no obvious injury on exam and she went back to bed. This morning she was not able to walk and was complaining of worsening pain they brought her to the emergency department by EMS. She denies any chest pain or shortness of breath. No abdominal pain. No back pain, neck pain or headache. No numbness, tingling or focal weakness.  ROS: See HPI Constitutional: no fever  Eyes: no drainage  ENT: no runny nose   Cardiovascular:  no chest pain  Resp: no SOB  GI: no vomiting GU: no dysuria Integumentary: no rash  Allergy: no hives  Musculoskeletal: no leg swelling  Neurological: no slurred speech ROS otherwise negative  PAST MEDICAL HISTORY/PAST SURGICAL HISTORY:  Past Medical History  Diagnosis Date  . Chicken pox as a child  . Measles as a child  . IBS (irritable bowel syndrome)   . Diabetes mellitus 47    type 2  . Hyperlipidemia 72  . Hypertension 72  . Vision loss of right eye   . Vertigo 2010    benign  . Calcification of cartilage     ear  . Cardiovascular system disease 07/02/2011  . Hemorrhoid 07/02/2011  . Osteoporosis 07/02/2011  . Weight loss 07/31/2011  . Anemia 05/22/2012  . Broken wrist 10-22-11    left  . Depression 05/22/2012  . Dermatitis 05/22/2012    Right neck    MEDICATIONS:  Prior to Admission medications   Medication Sig Start Date  End Date Taking? Authorizing Provider  atorvastatin (LIPITOR) 10 MG tablet Take 1 tablet (10 mg total) by mouth daily. 04/21/13   Mosie Lukes, MD  donepezil (ARICEPT) 10 MG tablet Take 10 mg by mouth at bedtime. 04/21/13   Mosie Lukes, MD  donepezil (ARICEPT) 10 MG tablet TAKE 1 TABLET BY MOUTH AT BEDTIME 06/12/13   Mosie Lukes, MD  glucose blood (FREESTYLE LITE) test strip Use as directed 11/26/11   Mosie Lukes, MD  metFORMIN (GLUCOPHAGE-XR) 500 MG 24 hr tablet Take 1 tablet (500 mg total) by mouth daily with breakfast. 04/21/13   Mosie Lukes, MD  neomycin-polymyxin-hydrocortisone (CORTISPORIN) otic solution Place 3 drops into both ears 3 (three) times daily. X 7 days 04/21/13   Mosie Lukes, MD  oseltamivir (TAMIFLU) 75 MG capsule Take 1 capsule (75 mg total) by mouth daily. 05/13/13   Mosie Lukes, MD    ALLERGIES:  No Known Allergies  SOCIAL HISTORY:  History  Substance Use Topics  . Smoking status: Never Smoker   . Smokeless tobacco: Never Used  . Alcohol Use: No    FAMILY HISTORY: Family History  Problem Relation Age of Onset  . Diabetes Mother     type 2  . Hypertension Mother   . Cancer Brother     lung-smoker  . Diabetes Daughter     pre diabetic  .  Diabetes Son     type 2  . Cancer Maternal Grandfather     prostate    EXAM: BP 175/65  Pulse 68  Temp(Src) 98.4 F (36.9 C) (Oral)  Resp 16  SpO2 100% CONSTITUTIONAL: Alert and oriented and responds appropriately to questions. Well-appearing; well-nourished; GCS 39, elderly, in no apparent distress HEAD: Normocephalic; atraumatic EYES: Conjunctivae clear, PERRL, EOMI ENT: normal nose; no rhinorrhea; moist mucous membranes; pharynx without lesions noted; no dental injury; no septal hematoma NECK: Supple, no meningismus, no LAD; no midline spinal tenderness, step-off or deformity CARD: RRR; S1 and S2 appreciated; no murmurs, no clicks, no rubs, no gallops RESP: Normal chest excursion without  splinting or tachypnea; breath sounds clear and equal bilaterally; no wheezes, no rhonchi, no rales; chest wall stable, nontender to palpation; pacemaker in left anterior chest that is mildly tender to palpation with no erythema or warmth or induration ABD/GI: Normal bowel sounds; non-distended; soft, non-tender, no rebound, no guarding PELVIS:  stable, nontender to palpation BACK:  The back appears normal and is non-tender to palpation, there is no CVA tenderness; no midline spinal tenderness, step-off or deformity EXT: Patient's right lower extremity is externally rotated and shortened, she has tenderness to palpation over the anterior and lateral right hip with pain with only minimal movement of the right hip, no pain over the right knee or effusion or right ankle, no ecchymosis, 2+ DP pulses bilaterally, otherwise extremities have Normal ROM in all joints; non-tender to palpation; no edema; normal capillary refill; no cyanosis    SKIN: Normal color for age and race; warm NEURO: Moves all extremities equally, cranial nerves II through XII intact, sensation to light-touch intact diffusely PSYCH: The patient's mood and manner are appropriate. Grooming and personal hygiene are appropriate.  MEDICAL DECISION MAKING: Patient here with mechanical fall with right hip fracture. She is hemodynamically stable, neurologically intact. We'll obtain x-rays of right hip and screening labs, chest x-ray, EKG. Patient denies wanting pain medication at this time. Family reports he would prefer to see degrees per orthopedics. Her PCP is with [facility].  ED PROGRESS: The patient has a right intertrochanteric hip fracture. Spoke with Dr. Maureen Ralphs will likely take the patient to the OR tonight. Updated family. Her labs are unremarkable. Urine does show urinary tract infection. Have given ceftriaxone. Have discussed with hospitalist for admission.   EKG Interpretation   EKG Interpretation  Date/Time:  Friday July 24 2013 11:46:19 EST Ventricular Rate:  68 PR Interval:  271 QRS Duration: 146 QT Interval:  451 QTC Calculation: 480 R Axis:   -70 Text Interpretation:  Atrial-sensed ventricular-paced rhythm No further analysis attempted due to paced rhythm Confirmed by WARD  DO, KRISTEN ([id number]) on 07/24/2013 1:56:20 PM        [place], DO 07/24/13 1356

## 2013-07-24 NOTE — Progress Notes (Signed)
Clinical Social Work Department BRIEF PSYCHOSOCIAL ASSESSMENT 07/24/2013  Patient:  Briana Thornton, Briana Thornton     Account Number:  [id number]     Admit date:  07/24/2013  Clinical Social Worker:  Luretha Rued  Date/Time:  07/24/2013 03:00 PM  Referred by:  CSW  Date Referred:  07/24/2013 Referred for  SNF Placement   Other Referral:   Interview type:  Patient Other interview type:   Daughter at bedside    PSYCHOSOCIAL DATA Living Status:  FAMILY Admitted from facility:   Level of care:   Primary support name:  Butler Denmark Primary support relationship to patient:  CHILD, ADULT Degree of support available:   Patient is currently living with her daughter.    CURRENT CONCERNS  Other Concerns:    SOCIAL WORK ASSESSMENT / PLAN CSW met with patient and daughter at bedside to complete this psychosocial assessment.  Patient presents as alert, calm, oriented x4. Patient reports multiple falls in the past that resulted in ortho surgeries and now her living with daughter for saftey.  Patient acknowledges the potential need for long term placement as her fall risk has increased.  Patient reports that she and her family had talked about all possiblities and they are ready to make a long-term decision.  CSW provided the patient and daughter with [facility] SNF list for rehab and offered ALF but they declined at this time.  Patient's daughter is a Marine scientist with [employer] and is aware of the possibilities with hip surgiery.   Assessment/plan status:  Psychosocial Support/Ongoing Assessment of Needs Other assessment/ plan:   Information/referral to community resources:   SNF lsit    PATIENT'S/FAMILY'S RESPONSE TO PLAN OF CARE: Daughter shared her appreication for the immediate attenton to her family's needs and will follow up with unit social worker.       Chesley Noon, MSW, [place], 07/24/2013, 4:10 PM Evening Clinical Social Worker [phone]

## 2013-07-24 NOTE — Op Note (Signed)
  OPERATIVE REPORT  PREOPERATIVE DIAGNOSIS: Right intertrochanteric femur fracture.   POSTOP DIAGNOSIS: Right intertrochanteric femur fracture.   PROCEDURE: Intramedullary nailing, Right intertrochanteric femur fracture.   SURGEON: Gaynelle Arabian, M.D.   ASSISTANT: Alexzandrew L. Perkins, P.A.C.   ANESTHESIA:General  Estimated BLOOD LOSS: minimal  DRAINS: None.   COMPLICATIONS:   None  CONDITION: -PACU - hemodynamically stable.    CLINICAL NOTE: Briana Thornton is an 80 y.o. female, who had a fall last night  days ago sustaining a displaced  Right intertrochanteric femur fracture. She presented to the ED today and has been cleared medically. She presents to the OR now for IM nailing right femur    PROCEDURE IN DETAIL: After successful administration of  General,  the patient was placed on the fracture table with Right lower extremity in a well-padded traction boot,  Left lower extremity in a well-padded leg holder. Under fluoroscopic guidance, the fracture was reduced. The traction was locked in this position. Thigh was prepped  and draped in the usual sterile fashion. The guide pin for the Biomet  Affixus was then passed percutaneously to the tip of the greater  trochanter, was entered into the femoral canal. It was passed into the  canal. The small incision was made and the starter reamer passed over  the guide pin. This was then removed. The nail which was an 13 mm  diameter short trochanteric nail with 130 degrees angle was attached to  the external guide and then passed into the femoral canal, impacted to  the appropriate depth in the canal, then we used the external guide to  place the lag screw. Through the external guide, a guide pin was  passed. Small incision made, and the guide pin was in the center of the  femoral head on the AP and slightly center to posterior on the lateral.  Length was 95 mm. Triple reamer was passed over the guide pin. 95 mm  lag screw was placed.  It was then locked down with a locking screw.  Through the external guide, the distal interlock was placed through the  static hole and this was 36 mm in length with excellent bicortical  purchase. The external guide was then removed. Hardware was in good  position and fracture was well reduced. Wound was copiously irrigated with saline  solution, and  closed deep with interrupted 1 Vicryl, subcu  interrupted 2-0 Vicryl, subcuticular running 4-0 Monocryl. Incision was  cleaned and dried and sterile dressings applied. She was awakened and  transported to recovery in stable condition.   Dione Plover Selden Noteboom, MD    07/24/2013, 5:46 PM

## 2013-07-24 NOTE — H&P (Signed)
[facility] History and Physical  Briana Thornton GBT:[id number] DOB: 05/20/1927 DOA: 07/24/2013  Referring physician:  PCP: Penni Homans, MD   Chief Complaint: Status post fall  HPI: Briana Thornton is a 80 y.o. female  with a past medical history of complete heart block status post permanent pacemaker implant, diabetes, dyslipidemia, cognitive impairment, having a history of recurrent falls who was brought to the emergency Department at [facility] by EMS. Patient was in her usual state health, getting up at approximately 2:00 in the morning to use the restroom, having a fall with her walker along the way. She denied any symptoms prior to the fall and feels she simply may have lost her balance. She landed on a carpeted surface. Her daughter and son-in-law heard her fall, found her on the floor and helped her back in bed. This morning she was unable to get a bed, unable to bear weight on her right lower extremity, reporting severe pain to right hip. Imaging studies performed in the emergency department showing  the development of an intertrochanteric fracture of the right femur. She also had a CT scan of head and neck which revealed the presence of  a right paraesophageal soft tissue mass which could represent a thyroid mass, parathyroid mass and or enlargement of lymph node. Dr. Leonides Schanz of emergency medicine spoke with Dr. Maureen Ralphs of orthopedic surgery, patient likely to undergo ORIF this evening. She denies chest pain or shortness of breath on exertion, palpitations, syncope, dizziness, lightheadedness.                                                                                                                                                   Review of Systems:  Constitutional:  No weight loss, night sweats, Fevers, chills, fatigue.  HEENT:  No headaches, Difficulty swallowing,Tooth/dental problems,Sore throat,  No sneezing, itching, ear ache, nasal congestion, post nasal drip,   Cardio-vascular:  No chest pain, Orthopnea, PND, swelling in lower extremities, anasarca, dizziness, palpitations  GI:  No heartburn, indigestion, abdominal pain, nausea, vomiting, diarrhea, change in bowel habits, loss of appetite  Resp:  No shortness of breath with exertion or at rest. No excess mucus, no productive cough, No non-productive cough, No coughing up of blood.No change in color of mucus.No wheezing.No chest wall deformity  Skin:  no rash or lesions.  GU:  no dysuria, change in color of urine, no urgency or frequency. No flank pain.  Musculoskeletal:  Positive for right lower extremity pain. No decreased range of motion. No back pain.  Psych:  No change in mood or affect. No depression or anxiety. Positive for memory loss.   Past Medical History  Diagnosis Date  . Chicken pox as a child  . Measles as a child  . IBS (irritable bowel syndrome)   . Diabetes mellitus 47    type  2  . Hyperlipidemia 72  . Hypertension 72  . Vision loss of right eye   . Vertigo 2010    benign  . Calcification of cartilage     ear  . Cardiovascular system disease 07/02/2011  . Hemorrhoid 07/02/2011  . Osteoporosis 07/02/2011  . Weight loss 07/31/2011  . Anemia 05/22/2012  . Broken wrist 10-22-11    left  . Depression 05/22/2012  . Dermatitis 05/22/2012    Right neck   Past Surgical History  Procedure Laterality Date  . Hemorroidectomy  1979  . Knee scoped  2002    right  . Rotator cuff repair  2004    right  . Abdominal hysterectomy  2008    partial still has ovaries  . Lens implant left  2006  . Pacemaker insertion  2010  . Wrist surgery  11-05-11    left wrist   Social History:  reports that she has never smoked. She has never used smokeless tobacco. She reports that she does not drink alcohol or use illicit drugs.  No Known Allergies  Family History  Problem Relation Age of Onset  . Diabetes Mother     type 2  . Hypertension Mother   . Cancer Brother     lung-smoker  .  Diabetes Daughter     pre diabetic  . Diabetes Son     type 2  . Cancer Maternal Grandfather     prostate     Prior to Admission medications   Medication Sig Start Date End Date Taking? Authorizing Provider  atorvastatin (LIPITOR) 10 MG tablet Take 1 tablet (10 mg total) by mouth daily. 04/21/13  Yes Mosie Lukes, MD  donepezil (ARICEPT) 10 MG tablet Take 10 mg by mouth daily after breakfast.  04/21/13  Yes Mosie Lukes, MD  glucose blood (FREESTYLE LITE) test strip Use as directed 11/26/11  Yes Mosie Lukes, MD  ibuprofen (ADVIL,MOTRIN) 200 MG tablet Take 400 mg by mouth every 6 (six) hours as needed for fever or moderate pain.   Yes Historical Provider, MD  metFORMIN (GLUCOPHAGE-XR) 500 MG 24 hr tablet Take 1 tablet (500 mg total) by mouth daily with breakfast. 04/21/13  Yes Mosie Lukes, MD  methylcellulose (ARTIFICIAL TEARS) 1 % ophthalmic solution Place 1 drop into both eyes as needed.   Yes Historical Provider, MD  Multiple Vitamin (MULTIVITAMIN WITH MINERALS) TABS tablet Take 2 tablets by mouth daily.   Yes Historical Provider, MD  neomycin-polymyxin-hydrocortisone (CORTISPORIN) otic solution Place 3 drops into both ears 3 (three) times daily. X 7 days 04/21/13  Yes Mosie Lukes, MD  Probiotic Product (PROBIOTIC DAILY PO) Take 1 tablet by mouth daily.   Yes Historical Provider, MD   Physical Exam: Filed Vitals:   07/24/13 1043  BP: 175/65  Pulse: 68  Temp: 98.4 F (36.9 C)  Resp: 16    BP 175/65  Pulse 68  Temp(Src) 98.4 F (36.9 C) (Oral)  Resp 16  SpO2 100%  General:  Appears calm and comfortable, in no acute distress Eyes: PERRL, normal lids, irises & conjunctiva ENT: grossly normal hearing, lips & tongue Neck: no LAD, I think there could be a palpable right sided mass Cardiovascular: RRR, July 6 systolic ejection murmur. No LE edema. Telemetry: SR, no arrhythmias  Respiratory: CTA bilaterally, no w/r/r. Normal respiratory effort. Abdomen: soft,  ntnd Skin: no rash or induration seen on limited exam Musculoskeletal: Patient reporting severe pain with passive and active movement of right  lower extremity Psychiatric: grossly normal mood and affect, speech fluent and appropriate Neurologic: grossly non-focal.          Labs on Admission:  Basic Metabolic Panel:  Recent Labs Lab 07/24/13 1205  NA 142  K 3.9  CL 104  CO2 27  GLUCOSE 132*  BUN 21  CREATININE 0.97  CALCIUM 9.7   Liver Function Tests: No results found for this basename: AST, ALT, ALKPHOS, BILITOT, PROT, ALBUMIN,  in the last 168 hours No results found for this basename: LIPASE, AMYLASE,  in the last 168 hours No results found for this basename: AMMONIA,  in the last 168 hours CBC:  Recent Labs Lab 07/24/13 1205  WBC 10.3  NEUTROABS 8.7*  HGB 11.8*  HCT 36.5  MCV 88.6  PLT 252   Cardiac Enzymes: No results found for this basename: CKTOTAL, CKMB, CKMBINDEX, TROPONINI,  in the last 168 hours  BNP (last 3 results) No results found for this basename: PROBNP,  in the last 8760 hours CBG: No results found for this basename: GLUCAP,  in the last 168 hours  Radiological Exams on Admission: Dg Chest 1 View  07/24/2013   CLINICAL DATA:  80 year old female preoperative study for hip fracture. Initial encounter. History of diabetes hypertension and pacemaker.  EXAM: CHEST - 1 VIEW  COMPARISON:  None.  FINDINGS: Portable AP supine view at 1132 hrs. Left chest dual lead cardiac pacemaker. Somewhat low lung volumes. Mild cardiomegaly. Other mediastinal contours are within normal limits. Calcified atherosclerosis of the aorta. Suggestion also of some calcified hilar lymph nodes. No pneumothorax, pulmonary edema, pleural effusion or consolidation. Chronic left clavicle fracture. Severe lower thoracic compression fracture. Osteopenia.  IMPRESSION: 1.  No acute cardiopulmonary abnormality. 2. Mild cardiomegaly. Severe lower thoracic compression fracture. Suggestion of  previous left lung granulomatous disease.   Electronically Signed   By: Lars Pinks M.D.   On: 07/24/2013 11:48   Dg Hip Complete Right  07/24/2013   CLINICAL DATA:  Fall.  Right hip pain.  EXAM: RIGHT HIP - COMPLETE 2+ VIEW  COMPARISON:  10/20/2011  FINDINGS: There is a mildly displaced intertrochanteric fracture of the right femur with varus angulation. The femoral head remains approximated with the acetabulum. The visualized proximal left femur is grossly unremarkable. Dystrophic calcification in the right pelvis is unchanged and likely represents a uterine fibroid.  IMPRESSION: Intertrochanteric fracture of the right femur.   Electronically Signed   By: Logan Bores   On: 07/24/2013 11:51   Ct Cervical Spine Wo Contrast  07/24/2013   CLINICAL DATA:  Fall  EXAM: CT HEAD WITHOUT CONTRAST  CT CERVICAL SPINE WITHOUT CONTRAST  TECHNIQUE: Multidetector CT imaging of the head and cervical spine was performed following the standard protocol without intravenous contrast. Multiplanar CT image reconstructions of the cervical spine were also generated.  COMPARISON:  None.  FINDINGS: CT HEAD FINDINGS  Generalized atrophy. Chronic microvascular ischemic change in the white matter. Chronic lacune in the right putamen. Negative for acute infarct. Negative for hemorrhage.  18 mm ossified mass sub frontal region on the right adjacent to the falx. This may be attached to the cribriform plate and most likely is a meningioma. Adjacent frontal sinuses clear. No acute bony change. Negative for skull fracture.  CT CERVICAL SPINE FINDINGS  Negative for cervical spine fracture. Normal alignment. Mild spondylosis at C4-5, C5-6, C6-7.  Right paraesophageal soft tissue mass measures 16 mm and is similar density to thyroid. This most likely is a thyroid mass.  Other possibilities would include a parathyroid adenoma and adenopathy. Thyroid overall is not enlarged. No cervical adenopathy elsewhere.  IMPRESSION: Atrophy and chronic  microvascular ischemia. 18 mm calcified sub frontal mass in the right most likely a meningioma.  No acute intracranial abnormality.  Cervical spondylosis without fracture.  Right paraesophageal soft tissue mass may represent a thyroid mass, parathyroid mass, or and enlarged lymph node.   Electronically Signed   By: Franchot Gallo M.D.   On: 07/24/2013 11:26    EKG: Independently reviewed.   Assessment/Plan Active Problems:   Hip fracture   Fall at home   Thyroid mass of unclear etiology   Hypertension   Hyperlipidemia   Pacemaker-[employer]   1. Intertrochanteric fracture of the right femur. Patient having mechanical fall overnight, with imaging studies performed in the emergency department showed intertrochanteric fracture of the right femur. Orthopedic surgery has been consulted as she is presently n.p.o. and will likely undergo orthopedic repair this evening. She denies having shortness of breath or chest pain with exertion and does not appear to have active cardiopulmonary issues. A chest x-ray performed in the emergency department did not show acute cardiopulmonary disease. 2. Possible thyroid mass. CT scan of head and neck did reveal the presence of a right paraesophageal soft tissue mass of unclear etiology. Will check a thyroid ultrasound for better characterization as well as a TSH.   3. Urinary tract infection. Urine showed the presence of leukocyte esterase and bacteria, started Rocephin 1 g IV every 24 hours, followup on urine cultures 4. Type 2 diabetes mellitus. Patient is presently n.p.o., will hold metformin, perform Accu-Cheks every 4 hours with sliding scale coverage while n.p.o. 5. History of complete heart block, status post pacemaker implant 6. Dyslipidemia. Continue statin therapy   Code Status: DO NOT RESUSCITATE Family Communication: I spoke with patient's daughter present at bedside Disposition Plan: Anticipate she will require at least 2 night hospitalization  Time  spent: 52 minutes  Kelvin Cellar [facility] Pager [phone]

## 2013-07-24 NOTE — Progress Notes (Signed)
UR completed. Ciarrah Rae RN CCM Case Mgmt phone [phone] 

## 2013-07-24 NOTE — ED Notes (Signed)
Bed: WA05 Expected date:  Expected time:  Means of arrival:  Comments: ems- hip pain 

## 2013-07-24 NOTE — Preoperative (Signed)
Beta Blockers   Reason not to administer Beta Blockers:Not Applicable 

## 2013-07-25 ENCOUNTER — Inpatient Hospital Stay (HOSPITAL_COMMUNITY): Payer: Medicare Other

## 2013-07-25 DIAGNOSIS — S72009A Fracture of unspecified part of neck of unspecified femur, initial encounter for closed fracture: Secondary | ICD-10-CM | POA: Diagnosis not present

## 2013-07-25 DIAGNOSIS — W19XXXA Unspecified fall, initial encounter: Secondary | ICD-10-CM | POA: Diagnosis not present

## 2013-07-25 DIAGNOSIS — E042 Nontoxic multinodular goiter: Secondary | ICD-10-CM | POA: Diagnosis not present

## 2013-07-25 DIAGNOSIS — N390 Urinary tract infection, site not specified: Secondary | ICD-10-CM | POA: Diagnosis not present

## 2013-07-25 DIAGNOSIS — D497 Neoplasm of unspecified behavior of endocrine glands and other parts of nervous system: Secondary | ICD-10-CM | POA: Diagnosis not present

## 2013-07-25 LAB — BASIC METABOLIC PANEL
BUN: 21 mg/dL (ref 6–23)
CALCIUM: 8.8 mg/dL (ref 8.4–10.5)
CO2: 26 mEq/L (ref 19–32)
Chloride: 109 mEq/L (ref 96–112)
Creatinine, Ser: 1.1 mg/dL (ref 0.50–1.10)
GFR calc non Af Amer: 44 mL/min — ABNORMAL LOW (ref >90)
GFR, EST AFRICAN AMERICAN: 52 mL/min — AB (ref >90)
Glucose, Bld: 146 mg/dL — ABNORMAL HIGH (ref 70–99)
Potassium: 4.2 mEq/L (ref 3.7–5.3)
SODIUM: 145 meq/L (ref 137–147)

## 2013-07-25 LAB — GLUCOSE, CAPILLARY
GLUCOSE-CAPILLARY: 122 mg/dL — AB (ref 70–99)
Glucose-Capillary: 129 mg/dL — ABNORMAL HIGH (ref 70–99)
Glucose-Capillary: 113 mg/dL — ABNORMAL HIGH (ref 70–99)
Glucose-Capillary: 153 mg/dL — ABNORMAL HIGH (ref 70–99)

## 2013-07-25 LAB — HEMOGLOBIN AND HEMATOCRIT, BLOOD
HEMATOCRIT: 23.5 % — AB (ref 36.0–46.0)
Hemoglobin: 7.8 g/dL — ABNORMAL LOW (ref 12.0–15.0)

## 2013-07-25 LAB — CBC
HCT: 24.2 % — ABNORMAL LOW (ref 36.0–46.0)
Hemoglobin: 7.9 g/dL — ABNORMAL LOW (ref 12.0–15.0)
MCH: 29.2 pg (ref 26.0–34.0)
MCHC: 32.6 g/dL (ref 30.0–36.0)
MCV: 89.3 fL (ref 78.0–100.0)
Platelets: 220 10*3/uL (ref 150–400)
RBC: 2.71 MIL/uL — AB (ref 3.87–5.11)
RDW: 14.3 % (ref 11.5–15.5)
WBC: 8.8 10*3/uL (ref 4.0–10.5)

## 2013-07-25 LAB — PREPARE RBC (CROSSMATCH)

## 2013-07-25 LAB — TSH: TSH: 1.075 u[IU]/mL (ref 0.350–4.500)

## 2013-07-25 LAB — HEMOGLOBIN A1C
HEMOGLOBIN A1C: 5.8 % — AB (ref <5.7)
Mean Plasma Glucose: 120 mg/dL — ABNORMAL HIGH (ref <117)

## 2013-07-25 MED ORDER — CIPROFLOXACIN HCL 500 MG PO TABS
500.0000 mg | ORAL_TABLET | Freq: Two times a day (BID) | ORAL | Status: DC
  Administered 2013-07-25 – 2013-07-26 (×3): 500 mg via ORAL
  Filled 2013-07-25 (×5): qty 1

## 2013-07-25 MED ORDER — FUROSEMIDE 10 MG/ML IJ SOLN
20.0000 mg | Freq: Once | INTRAMUSCULAR | Status: AC
  Administered 2013-07-25: 20 mg via INTRAVENOUS
  Filled 2013-07-25: qty 2

## 2013-07-25 NOTE — Evaluation (Signed)
Physical Therapy Evaluation Patient Details Name: Briana Thornton MRN: [id number] DOB: June 16, 1926 Today's Date: 07/25/2013 Time: 3329-5188 PT Time Calculation (min): 30 min  PT Assessment / Plan / Recommendation History of Present Illness  R hip IM nail  Clinical Impression   Pt tolerated mobilizing to recliner, able to stand at Rw and take small hopping steps. Pt will benefit from PT to address problems listed. Plans for SNF.   PT Assessment  Patient needs continued PT services    Follow Up Recommendations  SNF    Does the patient have the potential to tolerate intense rehabilitation      Barriers to Discharge        Equipment Recommendations  None recommended by PT    Recommendations for Other Services     Frequency Min 5X/week    Precautions / Restrictions Precautions Precautions: Fall Restrictions Weight Bearing Restrictions: Yes RLE Weight Bearing: Weight bearing as tolerated   Pertinent Vitals/Pain Indicates  R hip is sore when placing weight, premedicated      Mobility  Bed Mobility Overal bed mobility: Needs Assistance;+2 for physical assistance Bed Mobility: Supine to Sit Supine to sit: +2 for physical assistance;+2 for safety/equipment;Max assist General bed mobility comments: extra time for step by step mobility, support of trunk to upright and  to move legs to the edge  Transfers Overall transfer level: Needs assistance Equipment used: Rolling walker (2 wheeled) Transfers: Sit to/from [employer] Transfers Sit to Stand: +2 physical assistance;+2 safety/equipment;Max assist;From elevated surface Stand pivot transfers: Max assist;+2 physical assistance;+2 safety/equipment General transfer comment: pt able to take several steps with weight on R leg to get to recliner, assist to lower  controlled descent, cues for hand palcement.    Exercises     PT Diagnosis: Difficulty walking;Acute pain;Generalized weakness  PT Problem List: Decreased  strength;Decreased range of motion;Decreased activity tolerance;Decreased balance;Decreased mobility;Decreased safety awareness;Decreased knowledge of use of DME;Decreased knowledge of precautions;Pain PT Treatment Interventions: DME instruction;Gait training;Functional mobility training;Therapeutic activities;Therapeutic exercise;Patient/family education     PT Goals(Current goals can be found in the care plan section) Acute Rehab PT Goals Patient Stated Goal: per daughter , to go to rehab PT Goal Formulation: With patient/family Time For Goal Achievement: 08/08/13 Potential to Achieve Goals: Good  Visit Information  Last PT Received On: 07/25/13 Assistance Needed: +2 History of Present Illness: R hip IM nail       Prior Functioning  Home Living Family/patient expects to be discharged to:: Skilled nursing facility Additional Comments: Butler Denmark RN is daughter. Prior Function Level of Independence: Independent with assistive device(s) Communication Communication: No difficulties    Cognition  Cognition Arousal/Alertness: Awake/alert Behavior During Therapy: WFL for tasks assessed/performed Overall Cognitive Status: History of cognitive impairments - at baseline    Extremity/Trunk Assessment Upper Extremity Assessment Upper Extremity Assessment: Generalized weakness Lower Extremity Assessment Lower Extremity Assessment: RLE deficits/detail RLE Deficits / Details: able to bear weight for stand pivot transfers, assistance to slide legs to edge of bed   Balance Balance Overall balance assessment: Needs assistance Sitting-balance support: Bilateral upper extremity supported Sitting balance-[name]: Fair Postural control: Posterior lean  End of Session PT - End of Session Equipment Utilized During Treatment: Gait belt Activity Tolerance: Patient tolerated treatment well Patient left: in chair;with call bell/phone within reach;with family/visitor present Nurse  Communication: Mobility status  GP     Claretha Cooper 07/25/2013, 4:01 PM

## 2013-07-25 NOTE — Progress Notes (Signed)
Physical Therapy Treatment Patient Details Name: Kimiyo Carmicheal MRN: [id number] DOB: 03-31-1927 Today's Date: 07/25/2013 Time: 2947-6546 PT Time Calculation (min): 11 min  PT Assessment / Plan / Recommendation  History of Present Illness R hip IM nail   PT Comments   To get 2 units of blood. No dizziness. Tolerated back to bed  Follow Up Recommendations  SNF     Does the patient have the potential to tolerate intense rehabilitation     Barriers to Discharge        Equipment Recommendations  None recommended by PT    Recommendations for Other Services    Frequency Min 5X/week   Progress towards PT Goals Progress towards PT goals: Progressing toward goals  Plan Current plan remains appropriate    Precautions / Restrictions Precautions Precautions: Fall Restrictions Weight Bearing Restrictions: Yes RLE Weight Bearing: Weight bearing as tolerated   Pertinent Vitals/Pain Indicates R hip is sore.    Mobility  Bed Mobility Overal bed mobility: Needs Assistance;+2 for physical assistance Bed Mobility: Sit to Supine  Sit to supine: Total assist;+2 for safety/equipment;+2 for physical assistance General bed mobility comments: trunk support and both legs onto bed. Transfers Overall transfer level: Needs assistance Equipment used: Rolling walker (2 wheeled) Transfers: Sit to/from [employer] Sit to Stand: +2 physical assistance;+2 safety/equipment;Max assist;From elevated surface Stand pivot transfers: Max assist;+2 physical assistance;+2 safety/equipment General transfer comment: pt able to take several steps with weight on R leg to get to bed, assist to move R le at times,control descent, cues for hand palcement.    Exercises     PT Diagnosis: Difficulty walking;Acute pain;Generalized weakness  PT Problem List: Decreased strength;Decreased range of motion;Decreased activity tolerance;Decreased balance;Decreased mobility;Decreased safety awareness;Decreased  knowledge of use of DME;Decreased knowledge of precautions;Pain PT Treatment Interventions: DME instruction;Gait training;Functional mobility training;Therapeutic activities;Therapeutic exercise;Patient/family education   PT Goals (current goals can now be found in the care plan section) Acute Rehab PT Goals Patient Stated Goal: per daughter , to go to rehab PT Goal Formulation: With patient/family Time For Goal Achievement: 08/08/13 Potential to Achieve Goals: Good  Visit Information  Last PT Received On: 07/25/13 Assistance Needed: +2 History of Present Illness: R hip IM nail    Subjective Data  Patient Stated Goal: per daughter , to go to rehab   Cognition  Cognition Arousal/Alertness: Awake/alert Behavior During Therapy: WFL for tasks assessed/performed Overall Cognitive Status: History of cognitive impairments - at baseline    Balance  Balance Overall balance assessment: Needs assistance Sitting-balance support: Bilateral upper extremity supported Sitting balance-[name]: Fair Postural control: Posterior lean  End of Session PT - End of Session Equipment Utilized During Treatment: Gait belt Activity Tolerance: Patient tolerated treatment well Patient left: in bed;with call bell/phone within reach;with family/visitor present, bed alarm on Nurse Communication: Mobility status   GP     Claretha Cooper 07/25/2013, 4:08 PM

## 2013-07-25 NOTE — Progress Notes (Signed)
[facility] PROGRESS NOTE  Briana Thornton RWE:[id number] DOB: Jan 26, 1927 DOA: 07/24/2013 PCP: Penni Homans, MD  Assessment/Plan: 1. Intertrochanteric fracture right femur. Patient taken to the OR on 07/24/2013 undergoing intramedullary nail. Postoperatively she had a drop in her hemoglobin from 11.8-7.9. Pending repeat H&H, may transfuse several units of packed red blood cells if hemoglobin continues to fall. Physical therapy consulted. Patient would likely benefit from rehabilitation and skilled nursing facility. 2. Possible thyroid mass. CT scan of head and neck revealing the presence of a right paraesophageal soft tissue mass of unclear etiology, pending thyroid ultrasound. 3. Urinary tract infection. Will discontinue IV ceftriaxone, start ciprofloxacin 500 mg by mouth twice a day, urine cultures pending. 4. Type 2 diabetes mellitus. Blood sugars controlled. She had a hemoglobin A1c of 6.3 on 05/22/2012. 5. History of complete heart block, status post pacemaker implant 6. DVT prophylaxis. Lovenox  Code Status: DO NOT RESUSCITATE Family Communication: Patient's daughter present at bedside updated Disposition Plan: Recheck H&H, 1 to transfuse if hemoglobin continues to fall.   Consultants:  Orthopedic surgery  Procedures:  Intramedullary nailing of intertrochanteric fracture right femur, procedure performed on 07/24/2013  Antibiotics: Ceftriaxone(discontinued on 07/25/2013) Ciprofloxacin 500 mg by mouth twice a day (started on 07/17/2013)   HPI/Subjective: Patient is a pleasant 80 year old female with a past medical history of complete heart block status post permanent pacemaker implant, type 2 diabetes mellitus, dyslipidemia, cognitive impairment he was admitted to the medicine service on 07/24/2013. She presented to the emergency Department at [facility] after sustaining a fall the evening prior. She reported having a fall at home on her way to the bathroom landing  on carpet surface. Imaging studies showed the development of an intertrochanteric fracture of the right femur. Patient was taken to the OR on 07/24/2013 undergoing intramedullary nailing. She tolerated the procedure well there are no bleeding complications. Initial lab work also revealed the presence of a urinary tract infection, initially started on ceftriaxone, transition to oral ciprofloxacin. On the following morning her hemoglobin was noted to come down to 7.9 from 11.8 on admission. She was typed and crossed. A CT scan of head and neck performed on admission showed the presence of a right paraesophageal soft tissue mass which could represent a thyroid mass, parathyroid mass or enlargement of a lymph node. Thyroid ultrasound has been ordered, results pending. Patient otherwise reporting doing well on the morning of 07/25/2013, was helped out of bed to bedside chair. She is afebrile tolerating by mouth intake.  Objective: Filed Vitals:   07/25/13 0940  BP: 133/65  Pulse: 61  Temp: 98.9 F (37.2 C)  Resp: 14    Intake/Output Summary (Last 24 hours) at 07/25/13 1208 Last data filed at 07/25/13 0915  Gross per 24 hour  Intake   1813 ml  Output    790 ml  Net   1023 ml   There were no vitals filed for this visit.  Exam:   General:  Patient is in no acute distress, awake alert oriented  Cardiovascular: Regular rate and rhythm normal S1-S2, she was 6 systolic ejection murmur  Respiratory: Clear to auscultation bilaterally no wheezing rhonchi or rales  Abdomen: Soft nontender not a  Musculoskeletal: No edema to extremities, surgical incision site appears clean, no evidence of infection or hematoma  Data Reviewed: Basic Metabolic Panel:  Recent Labs Lab 07/24/13 1205 07/25/13 0447  NA 142 145  K 3.9 4.2  CL 104 109  CO2 27 26  GLUCOSE 132* 146*  BUN 21 21  CREATININE 0.97 1.10  CALCIUM 9.7 8.8   Liver Function Tests: No results found for this basename: AST, ALT,  ALKPHOS, BILITOT, PROT, ALBUMIN,  in the last 168 hours No results found for this basename: LIPASE, AMYLASE,  in the last 168 hours No results found for this basename: AMMONIA,  in the last 168 hours CBC:  Recent Labs Lab 07/24/13 1205 07/25/13 0447  WBC 10.3 8.8  NEUTROABS 8.7*  --   HGB 11.8* 7.9*  HCT 36.5 24.2*  MCV 88.6 89.3  PLT 252 220   Cardiac Enzymes: No results found for this basename: CKTOTAL, CKMB, CKMBINDEX, TROPONINI,  in the last 168 hours BNP (last 3 results) No results found for this basename: PROBNP,  in the last 8760 hours CBG:  Recent Labs Lab 07/24/13 1821 07/24/13 2138  GLUCAP 121* 158*    Recent Results (from the past 240 hour(s))  SURGICAL PCR SCREEN     Status: None   Collection Time    07/24/13  3:58 PM      Result Value Ref Range Status   MRSA, PCR NEGATIVE  NEGATIVE Final   Staphylococcus aureus NEGATIVE  NEGATIVE Final   Comment:            The Xpert SA Assay (FDA     approved for NASAL specimens     in patients over 109 years of age),     is one component of     a comprehensive surveillance     program.  Test performance has     been validated by [employer] for patients greater     than or equal to 80 year old.     It is not intended     to diagnose infection nor to     guide or monitor treatment.     Studies: Dg Chest 1 View  07/24/2013   CLINICAL DATA:  80 year old female preoperative study for hip fracture. Initial encounter. History of diabetes hypertension and pacemaker.  EXAM: CHEST - 1 VIEW  COMPARISON:  None.  FINDINGS: Portable AP supine view at 1132 hrs. Left chest dual lead cardiac pacemaker. Somewhat low lung volumes. Mild cardiomegaly. Other mediastinal contours are within normal limits. Calcified atherosclerosis of the aorta. Suggestion also of some calcified hilar lymph nodes. No pneumothorax, pulmonary edema, pleural effusion or consolidation. Chronic left clavicle fracture. Severe lower thoracic compression  fracture. Osteopenia.  IMPRESSION: 1.  No acute cardiopulmonary abnormality. 2. Mild cardiomegaly. Severe lower thoracic compression fracture. Suggestion of previous left lung granulomatous disease.   Electronically Signed   By: Lars Pinks M.D.   On: 07/24/2013 11:48   Dg Hip Complete Right  07/24/2013   CLINICAL DATA:  Fall.  Right hip pain.  EXAM: RIGHT HIP - COMPLETE 2+ VIEW  COMPARISON:  10/20/2011  FINDINGS: There is a mildly displaced intertrochanteric fracture of the right femur with varus angulation. The femoral head remains approximated with the acetabulum. The visualized proximal left femur is grossly unremarkable. Dystrophic calcification in the right pelvis is unchanged and likely represents a uterine fibroid.  IMPRESSION: Intertrochanteric fracture of the right femur.   Electronically Signed   By: Logan Bores   On: 07/24/2013 11:51   Dg Hip Operative Right  07/24/2013   CLINICAL DATA:  Right hip IM nail  EXAM: DG OPERATIVE RIGHT HIP  TECHNIQUE: A single spot fluoroscopic AP image of the right hip is submitted.  COMPARISON:  Right hip radiographs  dated 07/24/2013 at 1126 hr  FINDINGS: Intraoperative fluoroscopic spot images were obtained and have been submitted for interpretation.  IM nail with dynamic hip screw fixation of an intertrochanteric right hip fracture, with fracture fragments in near anatomic alignment and position.  IMPRESSION: Intraoperative radiographs during ORIF of the right hip, as described above.   Electronically Signed   By: Julian Hy M.D.   On: 07/24/2013 20:03   Ct Cervical Spine Wo Contrast  07/24/2013   CLINICAL DATA:  Fall  EXAM: CT HEAD WITHOUT CONTRAST  CT CERVICAL SPINE WITHOUT CONTRAST  TECHNIQUE: Multidetector CT imaging of the head and cervical spine was performed following the standard protocol without intravenous contrast. Multiplanar CT image reconstructions of the cervical spine were also generated.  COMPARISON:  None.  FINDINGS: CT HEAD FINDINGS   Generalized atrophy. Chronic microvascular ischemic change in the white matter. Chronic lacune in the right putamen. Negative for acute infarct. Negative for hemorrhage.  18 mm ossified mass sub frontal region on the right adjacent to the falx. This may be attached to the cribriform plate and most likely is a meningioma. Adjacent frontal sinuses clear. No acute bony change. Negative for skull fracture.  CT CERVICAL SPINE FINDINGS  Negative for cervical spine fracture. Normal alignment. Mild spondylosis at C4-5, C5-6, C6-7.  Right paraesophageal soft tissue mass measures 16 mm and is similar density to thyroid. This most likely is a thyroid mass. Other possibilities would include a parathyroid adenoma and adenopathy. Thyroid overall is not enlarged. No cervical adenopathy elsewhere.  IMPRESSION: Atrophy and chronic microvascular ischemia. 18 mm calcified sub frontal mass in the right most likely a meningioma.  No acute intracranial abnormality.  Cervical spondylosis without fracture.  Right paraesophageal soft tissue mass may represent a thyroid mass, parathyroid mass, or and enlarged lymph node.   Electronically Signed   By: Franchot Gallo M.D.   On: 07/24/2013 11:26    Scheduled Meds: . atorvastatin  10 mg Oral QPC supper  . ciprofloxacin  500 mg Oral BID  . docusate sodium  100 mg Oral BID  . donepezil  10 mg Oral QPC breakfast  . enoxaparin (LOVENOX) injection  40 mg Subcutaneous Q24H  . multivitamin with minerals  1 tablet Oral Daily   Continuous Infusions:   Active Problems:   Hip fracture   Fall at home   Thyroid mass of unclear etiology   Hypertension   Hyperlipidemia   Pacemaker-[employer]    Time spent: 35 min   Kelvin Cellar  [facility] Pager [phone]. If 7PM-7AM, please contact night-coverage at [email or web address], password [facility] 07/25/2013, 12:08 PM  LOS: 1 day

## 2013-07-25 NOTE — Anesthesia Postprocedure Evaluation (Signed)
  Anesthesia Post-op Note  Patient: Briana Thornton  Procedure(s) Performed: Procedure(s) (LRB): INTRAMEDULLARY (IM) NAIL INTERTROCHANTRIC (Right)  Patient Location: PACU  Anesthesia Type: General  Level of Consciousness: awake and alert   Airway and Oxygen Therapy: Patient Spontanous Breathing  Post-op Pain: mild  Post-op Assessment: Post-op Vital signs reviewed, Patient's Cardiovascular Status Stable, Respiratory Function Stable, Patent Airway and No signs of Nausea or vomiting  Last Vitals:  Filed Vitals:   07/25/13 0155  BP: 148/65  Pulse: 65  Temp: 36.8 C  Resp: 16    Post-op Vital Signs: stable   Complications: No apparent anesthesia complications

## 2013-07-25 NOTE — Progress Notes (Signed)
MD made aware of patient's lab result. Order given for transfusion of 2 units of blood. Pt made aware. Vwilliams,rn.

## 2013-07-25 NOTE — Progress Notes (Signed)
OT Cancellation Note  Patient Details Name: Briana Thornton MRN: [id number] DOB: 10-10-1926   Cancelled Treatment:    Reason Eval/Treat Not Completed: Other (comment) Note plan is for SNF. Will defer OT eval to SNF.  Pauline Aus [place] 07/25/2013, 2:34 PM

## 2013-07-25 NOTE — Progress Notes (Signed)
Subjective: Doing ok.  Pain controlled.     Objective: Vital signs in last 24 hours: Temp:  [97.1 F (36.2 C)-98.9 F (37.2 C)] 98.9 F (37.2 C) (02/28 0940) Pulse Rate:  [61-80] 61 (02/28 0940) Resp:  [9-18] 14 (02/28 0940) BP: (130-193)/(55-158) 133/65 mmHg (02/28 0940) SpO2:  [96 %-100 %] 100 % (02/28 0940)  Intake/Output from previous day: 02/27 0701 - 02/28 0700 In: 1693 [P.O.:100; I.V.:1493; IV Piggyback:100] Out: 850 [Urine:750; Blood:100] Intake/Output this shift: Total I/O In: 120 [P.O.:120] Out: -    Recent Labs  07/24/13 1205 07/25/13 0447  HGB 11.8* 7.9*    Recent Labs  07/24/13 1205 07/25/13 0447  WBC 10.3 8.8  RBC 4.12 2.71*  HCT 36.5 24.2*  PLT 252 220    Recent Labs  07/24/13 1205 07/25/13 0447  NA 142 145  K 3.9 4.2  CL 104 109  CO2 27 26  BUN 21 21  CREATININE 0.97 1.10  GLUCOSE 132* 146*  CALCIUM 9.7 8.8    Recent Labs  07/24/13 1205  INR 1.02  exam:  Dressing c/d/i.  bilat calves nt, nvi.  Alert and oriented.   Assessment/Plan: ABLA;  Will recheck H/H.  May need transfusion prbc's.  Anticipate transfer to snf Monday or Tuesday.     OWENS,JAMES M 07/25/2013, 10:22 AM

## 2013-07-26 DIAGNOSIS — E041 Nontoxic single thyroid nodule: Secondary | ICD-10-CM | POA: Diagnosis not present

## 2013-07-26 DIAGNOSIS — D62 Acute posthemorrhagic anemia: Secondary | ICD-10-CM | POA: Diagnosis not present

## 2013-07-26 DIAGNOSIS — S72009A Fracture of unspecified part of neck of unspecified femur, initial encounter for closed fracture: Secondary | ICD-10-CM | POA: Diagnosis not present

## 2013-07-26 DIAGNOSIS — W19XXXA Unspecified fall, initial encounter: Secondary | ICD-10-CM | POA: Diagnosis not present

## 2013-07-26 LAB — BASIC METABOLIC PANEL
BUN: 24 mg/dL — AB (ref 6–23)
CALCIUM: 8.8 mg/dL (ref 8.4–10.5)
CO2: 26 meq/L (ref 19–32)
Chloride: 104 mEq/L (ref 96–112)
Creatinine, Ser: 1.09 mg/dL (ref 0.50–1.10)
GFR calc Af Amer: 52 mL/min — ABNORMAL LOW (ref >90)
GFR calc non Af Amer: 45 mL/min — ABNORMAL LOW (ref >90)
Glucose, Bld: 117 mg/dL — ABNORMAL HIGH (ref 70–99)
Potassium: 3.9 mEq/L (ref 3.7–5.3)
Sodium: 142 mEq/L (ref 137–147)

## 2013-07-26 LAB — CBC
HCT: 29.7 % — ABNORMAL LOW (ref 36.0–46.0)
Hemoglobin: 9.9 g/dL — ABNORMAL LOW (ref 12.0–15.0)
MCH: 28.9 pg (ref 26.0–34.0)
MCHC: 33.7 g/dL (ref 30.0–36.0)
MCV: 85.8 fL (ref 78.0–100.0)
PLATELETS: 190 10*3/uL (ref 150–400)
RBC: 3.46 MIL/uL — ABNORMAL LOW (ref 3.87–5.11)
RDW: 14.9 % (ref 11.5–15.5)
WBC: 7.8 10*3/uL (ref 4.0–10.5)

## 2013-07-26 LAB — GLUCOSE, CAPILLARY
GLUCOSE-CAPILLARY: 112 mg/dL — AB (ref 70–99)
GLUCOSE-CAPILLARY: 156 mg/dL — AB (ref 70–99)
GLUCOSE-CAPILLARY: 169 mg/dL — AB (ref 70–99)
Glucose-Capillary: 103 mg/dL — ABNORMAL HIGH (ref 70–99)

## 2013-07-26 MED ORDER — METOCLOPRAMIDE HCL 10 MG PO TABS: 5.0000 mg | ORAL_TABLET | Freq: Three times a day (TID) | ORAL | Status: DC | PRN

## 2013-07-26 MED ORDER — CIPROFLOXACIN HCL 500 MG PO TABS
500.0000 mg | ORAL_TABLET | Freq: Every day | ORAL | Status: DC
  Administered 2013-07-27: 500 mg via ORAL
  Filled 2013-07-26 (×2): qty 1

## 2013-07-26 MED ORDER — METOCLOPRAMIDE HCL 5 MG/ML IJ SOLN: 5.0000 mg | Freq: Three times a day (TID) | INTRAMUSCULAR | Status: DC | PRN

## 2013-07-26 MED ORDER — ENOXAPARIN SODIUM 30 MG/0.3ML ~~LOC~~ SOLN
30.0000 mg | SUBCUTANEOUS | Status: DC
  Administered 2013-07-27: 30 mg via SUBCUTANEOUS
  Filled 2013-07-26 (×2): qty 0.3

## 2013-07-26 MED ORDER — TRAMADOL HCL 50 MG PO TABS
50.0000 mg | ORAL_TABLET | Freq: Four times a day (QID) | ORAL | Status: DC | PRN
  Administered 2013-07-27: 50 mg via ORAL
  Filled 2013-07-26: qty 1

## 2013-07-26 NOTE — Progress Notes (Signed)
[facility] PROGRESS NOTE  Briana Thornton BOF:[id number] DOB: 07-Dec-1926 DOA: 07/24/2013 PCP: Penni Homans, MD  Assessment/Plan: 1. Intertrochanteric fracture right femur. Patient taken to the OR on 07/24/2013 undergoing intramedullary nail. Postoperatively she had a drop in her hemoglobin from 11.8-7.9. She was typed and cross and transfuse 2 units of packed red blood cells with repeat hemoglobin on 07/26/2013 improving to 9.9. 2. Possible thyroid mass. This was followed up with a soft tissue ultrasound of the thyroid which showed several thyroid nodules, with largest arising in the lower pole of right lobe measuring 13 mm in greatest dimension. Radiology reporting that these do not meet current SRU consensus criteria for biopsy. Patient is not hyperthyroid, TSH within normal limits. Will monitor. 3. Acute blood loss anemia secondary to hip fracture and orthopedic procedure, transfuse with 2 units of packed red blood cells on 07/25/2013 4. Urinary tract infection. Will discontinue IV ceftriaxone, start ciprofloxacin 500 mg by mouth twice a day, urine cultures pending. Urine cultures growing greater than 100,000 colony-forming units of Escherichia coli, susceptibility testing pending at the time of this dictation. 5. Type 2 diabetes mellitus. Blood sugars controlled. She had a hemoglobin A1c of 6.3 on 05/22/2012. 6. History of complete heart block, status post pacemaker implant 7. DVT prophylaxis. Lovenox  Code Status: DO NOT RESUSCITATE Family Communication: Patient's daughter present at bedside updated Disposition Plan: Social work consult for SNF placement   Consultants:  Orthopedic surgery  Procedures:  Intramedullary nailing of intertrochanteric fracture right femur, procedure performed on 07/24/2013  Antibiotics: Ceftriaxone(discontinued on 07/25/2013) Ciprofloxacin 500 mg by mouth twice a day (started on 07/17/2013)   HPI/Subjective: Patient is a pleasant 80 year old  female with a past medical history of complete heart block status post permanent pacemaker implant, type 2 diabetes mellitus, dyslipidemia, cognitive impairment he was admitted to the medicine service on 07/24/2013. She presented to the emergency Department at [facility] after sustaining a fall the evening prior. She reported having a fall at home on her way to the bathroom landing on carpet surface. Imaging studies showed the development of an intertrochanteric fracture of the right femur. Patient was taken to the OR on 07/24/2013 undergoing intramedullary nailing. She tolerated the procedure well there are no bleeding complications. Initial lab work also revealed the presence of a urinary tract infection, initially started on ceftriaxone, transition to oral ciprofloxacin. On the following morning her hemoglobin was noted to come down to 7.9 from 11.8 on admission. She was typed and crossed and transfused with 2 units of packed red blood cells. A CT scan of head and neck performed on admission showed the presence of a right paraesophageal soft tissue mass which could represent a thyroid mass, parathyroid mass or enlargement of a lymph node. Patient otherwise reporting doing well on the morning of 07/25/2013, was helped out of bed to bedside chair. She is afebrile tolerating by mouth intake. A thyroid ultrasound performed on 07/26/2013 showed the presence of several thyroid nodules, largest one arising in the lower pole of the right lobe measuring 13 mm in greatest dimension. Radiology reporting that these do not meet SRU criteria for biopsy. TSH came back within normal limits, she did not appear to have clinical signs or symptoms suggesting hyperthyroidism. Urine cultures drawn on 07/24/2013 growing greater than 100,000 colony-forming units of Escherichia coli, susceptibility testing pending.  Objective: Filed Vitals:   07/26/13 0555  BP: 171/80  Pulse: 69  Temp: 98 F (36.7 C)  Resp: 20     Intake/Output  Summary (Last 24 hours) at 07/26/13 1421 Last data filed at 07/26/13 0900  Gross per 24 hour  Intake 1121.25 ml  Output   1625 ml  Net -503.75 ml   Filed Weights   07/25/13 1214  Weight: 51.256 kg (113 lb)    Exam:   General:  Patient is in no acute distress, awake alert oriented  Cardiovascular: Regular rate and rhythm normal S1-S2, she was 6 systolic ejection murmur  Respiratory: Clear to auscultation bilaterally no wheezing rhonchi or rales  Abdomen: Soft nontender not a  Musculoskeletal: No edema to extremities, surgical incision site appears clean, no evidence of infection or hematoma  Data Reviewed: Basic Metabolic Panel:  Recent Labs Lab 07/24/13 1205 07/25/13 0447 07/26/13 0455  NA 142 145 142  K 3.9 4.2 3.9  CL 104 109 104  CO2 27 26 26   GLUCOSE 132* 146* 117*  BUN 21 21 24*  CREATININE 0.97 1.10 1.09  CALCIUM 9.7 8.8 8.8   Liver Function Tests: No results found for this basename: AST, ALT, ALKPHOS, BILITOT, PROT, ALBUMIN,  in the last 168 hours No results found for this basename: LIPASE, AMYLASE,  in the last 168 hours No results found for this basename: AMMONIA,  in the last 168 hours CBC:  Recent Labs Lab 07/24/13 1205 07/25/13 0447 07/25/13 1236 07/26/13 0455  WBC 10.3 8.8  --  7.8  NEUTROABS 8.7*  --   --   --   HGB 11.8* 7.9* 7.8* 9.9*  HCT 36.5 24.2* 23.5* 29.7*  MCV 88.6 89.3  --  85.8  PLT 252 220  --  190   Cardiac Enzymes: No results found for this basename: CKTOTAL, CKMB, CKMBINDEX, TROPONINI,  in the last 168 hours BNP (last 3 results) No results found for this basename: PROBNP,  in the last 8760 hours CBG:  Recent Labs Lab 07/25/13 1146 07/25/13 1728 07/25/13 2145 07/26/13 0738 07/26/13 1133  GLUCAP 129* 113* 153* 112* 156*    Recent Results (from the past 240 hour(s))  URINE CULTURE     Status: None   Collection Time    07/24/13 11:52 AM      Result Value Ref Range Status   Specimen  Description URINE, CLEAN CATCH   Final   Special Requests NONE   Final   Culture  Setup Time     Final   Value: 07/24/2013 18:22     Performed at [facility]     Final   Value: >=100,000 COLONIES/ML     Performed at [facility]   Culture     Final   Value: ESCHERICHIA COLI     Performed at [facility]   Report Status PENDING   Incomplete  SURGICAL PCR SCREEN     Status: None   Collection Time    07/24/13  3:58 PM      Result Value Ref Range Status   MRSA, PCR NEGATIVE  NEGATIVE Final   Staphylococcus aureus NEGATIVE  NEGATIVE Final   Comment:            The Xpert SA Assay (FDA     approved for NASAL specimens     in patients over 83 years of age),     is one component of     a comprehensive surveillance     program.  Test performance has     been validated by [employer] for patients greater  than or equal to 87 year old.     It is not intended     to diagnose infection nor to     guide or monitor treatment.     Studies: Dg Hip Operative Right  2013-08-13   CLINICAL DATA:  Right hip IM nail  EXAM: DG OPERATIVE RIGHT HIP  TECHNIQUE: A single spot fluoroscopic AP image of the right hip is submitted.  COMPARISON:  Right hip radiographs dated 08/13/2013 at 1126 hr  FINDINGS: Intraoperative fluoroscopic spot images were obtained and have been submitted for interpretation.  IM nail with dynamic hip screw fixation of an intertrochanteric right hip fracture, with fracture fragments in near anatomic alignment and position.  IMPRESSION: Intraoperative radiographs during ORIF of the right hip, as described above.   Electronically Signed   By: Julian Hy M.D.   On: 2013/08/13 20:03   US Soft Tissue Head/neck  07/25/2013   CLINICAL DATA:  ?Thyroid mass  EXAM: THYROID ULTRASOUND  TECHNIQUE: Ultrasound examination of the thyroid gland and adjacent soft tissues was performed.  COMPARISON:  None.  FINDINGS: Right thyroid lobe  Measurements:  5.3 cm x 2.3 cm x 1 point dizziness. Gland is heterogeneous in echogenicity. There is a hypoechoic nodule in the posterior midpole measuring 6 mm x 5 mm x 5 mm. There is a heterogeneous nearly isoechoic nodule from the lower pole which measures 13 mm x 10 mm x 13 mm. No other discrete nodules.  Left thyroid lobe  Measurements: 5.1 cm x 2.3 cm x 1.5 cm. Gland is heterogeneous in echogenicity. There are few tiny hypoechoic/cystic nodules.  Isthmus  Thickness: 3 mm. Oval hypoechoic nodule resides along the right aspect of the isthmus measuring 9 mm x 4 mm x 8 mm.  Lymphadenopathy  None visualized.  IMPRESSION: 1. Thyroid gland is borderline enlarged and is diffusely heterogeneous in its echotexture. 2. There are several thyroid nodules. Largest arises from the lower pole of the right lobe, solid and nearly isoechoic, measuring 13 mm in greatest dimension. Findings do not meet current SRU consensus criteria for biopsy. Follow-up by clinical exam is recommended. If patient has known risk factors for thyroid carcinoma, consider follow-up ultrasound in 12 months. If patient is clinically hyperthyroid, consider nuclear medicine thyroid uptake and scan.Reference: Management of Thyroid Nodules Detected at [place]: Society of Radiologists in [place]. Radiology 2005; [id number].   Electronically Signed   By: Lajean Manes M.D.   On: 07/25/2013 17:30    Scheduled Meds: . atorvastatin  10 mg Oral QPC supper  . ciprofloxacin  500 mg Oral BID  . docusate sodium  100 mg Oral BID  . donepezil  10 mg Oral QPC breakfast  . enoxaparin (LOVENOX) injection  40 mg Subcutaneous Q24H  . multivitamin with minerals  1 tablet Oral Daily   Continuous Infusions:   Active Problems:   Hip fracture   Fall at home   Thyroid mass of unclear etiology   Hypertension   Hyperlipidemia   Pacemaker-[employer]    Time spent: 35 min   Kelvin Cellar  [facility] Pager [phone]. If 7PM-7AM, please  contact night-coverage at [email or web address], password [facility] 07/26/2013, 2:21 PM  LOS: 2 days

## 2013-07-26 NOTE — Progress Notes (Signed)
Physical Therapy Treatment Patient Details Name: Briana Thornton MRN: [id number] DOB: 1927/02/14 Today's Date: 07/26/2013 Time: 8182-9937 PT Time Calculation (min): 26 min  PT Assessment / Plan / Recommendation  History of Present Illness R hip IM nail   PT Comments    pt is progressing slowly; have encouraged her to do ankle pumps and quad sets as able; Pt dtr is present and supportive of mobility  Follow Up Recommendations  SNF     Does the patient have the potential to tolerate intense rehabilitation     Barriers to Discharge        Equipment Recommendations  None recommended by PT    Recommendations for Other Services    Frequency Min 5X/week   Progress towards PT Goals Progress towards PT goals: Progressing toward goals  Plan Current plan remains appropriate    Precautions / Restrictions Precautions Precautions: Fall Restrictions RLE Weight Bearing: Weight bearing as tolerated   Pertinent Vitals/Pain Min c/o pain right hip; ice to hip after therapy    Mobility  Transfers Overall transfer level: Needs assistance Equipment used: Rolling walker (2 wheeled) Transfers: Sit to/from Stand Sit to Stand: Mod assist;+2 physical assistance General transfer comment: pt able to take several steps with weight on R leg to get to bed, assist to move R le at times,control descent, cues for hand placement, encouragement Ambulation/Gait Ambulation/Gait assistance: +2 physical assistance;Max assist Ambulation Distance (Feet): 6 Feet Assistive device: Rolling walker (2 wheeled) Gait Pattern/deviations: Step-to pattern Gait velocity interpretation: <1.8 ft/sec, indicative of risk for recurrent falls General Gait Details: assist for balance, cues for sequence (step by step), posture, use of UEs; assist to advance RLE    Exercises     PT Diagnosis:    PT Problem List:   PT Treatment Interventions:     PT Goals (current goals can now be found in the care plan section) Acute Rehab PT  Goals Patient Stated Goal: per daughter , to go to rehab PT Goal Formulation: With patient/family Time For Goal Achievement: 08/08/13 Potential to Achieve Goals: Good  Visit Information  Last PT Received On: 07/26/13 Assistance Needed: +2 History of Present Illness: R hip IM nail    Subjective Data  Patient Stated Goal: per daughter , to go to rehab   Cognition  Cognition Arousal/Alertness: Awake/alert Behavior During Therapy: WFL for tasks assessed/performed Overall Cognitive Status: History of cognitive impairments - at baseline    Balance  Balance Postural control: Posterior lean  End of Session PT - End of Session Equipment Utilized During Treatment: Gait belt Activity Tolerance: Patient tolerated treatment well Patient left: in chair;with call bell/phone within reach;with family/visitor present Nurse Communication: Mobility status   GP     [facility] 07/26/2013, 4:10 PM

## 2013-07-26 NOTE — Progress Notes (Signed)
Chart review.  Noted that PT recommending SNF.  Pt's info sent to [facility], as well as all [facility].  Pt notified.  Pt's daughter notified.  Weekday CSW to follow.  Bernita Raisin, [place] [employer] [phone]

## 2013-07-26 NOTE — Progress Notes (Signed)
Subjective: 2 Days Post-Op Procedure(s) (LRB): INTRAMEDULLARY (IM) NAIL INTERTROCHANTRIC (Right) Patient reports pain as 4 on 0-10 scale.   No dizziness Objective: Vital signs in last 24 hours: Temp:  [97.6 F (36.4 C)-98.9 F (37.2 C)] 98 F (36.7 C) (03/01 0555) Pulse Rate:  [61-74] 69 (03/01 0555) Resp:  [14-20] 20 (03/01 0555) BP: (129-177)/(55-80) 171/80 mmHg (03/01 0555) SpO2:  [95 %-100 %] 96 % (03/01 0555) Weight:  [51.256 kg (113 lb)] 51.256 kg (113 lb) (02/28 1214)  Intake/Output from previous day: 02/28 0701 - 03/01 0700 In: 1241.3 [P.O.:360; I.V.:250; Blood:631.3] Out: 1725 [Urine:1725] Intake/Output this shift:     Recent Labs  07/24/13 1205 07/25/13 0447 07/25/13 1236 07/26/13 0455  HGB 11.8* 7.9* 7.8* 9.9*    Recent Labs  07/25/13 0447 07/25/13 1236 07/26/13 0455  WBC 8.8  --  7.8  RBC 2.71*  --  3.46*  HCT 24.2* 23.5* 29.7*  PLT 220  --  190    Recent Labs  07/25/13 0447 07/26/13 0455  NA 145 142  K 4.2 3.9  CL 109 104  CO2 26 26  BUN 21 24*  CREATININE 1.10 1.09  GLUCOSE 146* 117*  CALCIUM 8.8 8.8    Recent Labs  07/24/13 1205  INR 1.02    Neurologically intact Neurovascular intact Dorsiflexion/Plantar flexion intact Incision: dressing C/D/I Compartment soft  Assessment/Plan: 2 Days Post-Op Procedure(s) (LRB): INTRAMEDULLARY (IM) NAIL INTERTROCHANTRIC (Right) Advance diet Up with therapy D/C planning. Asx anemia  Briana Thornton C 07/26/2013, 7:54 AM

## 2013-07-27 ENCOUNTER — Encounter (HOSPITAL_COMMUNITY): Payer: Self-pay | Admitting: Orthopedic Surgery

## 2013-07-27 DIAGNOSIS — E785 Hyperlipidemia, unspecified: Secondary | ICD-10-CM | POA: Diagnosis not present

## 2013-07-27 DIAGNOSIS — Z4789 Encounter for other orthopedic aftercare: Secondary | ICD-10-CM | POA: Diagnosis not present

## 2013-07-27 DIAGNOSIS — N390 Urinary tract infection, site not specified: Secondary | ICD-10-CM | POA: Diagnosis not present

## 2013-07-27 DIAGNOSIS — E041 Nontoxic single thyroid nodule: Secondary | ICD-10-CM | POA: Diagnosis not present

## 2013-07-27 DIAGNOSIS — M6281 Muscle weakness (generalized): Secondary | ICD-10-CM | POA: Diagnosis not present

## 2013-07-27 DIAGNOSIS — E079 Disorder of thyroid, unspecified: Secondary | ICD-10-CM | POA: Diagnosis not present

## 2013-07-27 DIAGNOSIS — Z950 Presence of cardiac pacemaker: Secondary | ICD-10-CM | POA: Diagnosis not present

## 2013-07-27 DIAGNOSIS — Z9181 History of falling: Secondary | ICD-10-CM | POA: Diagnosis not present

## 2013-07-27 DIAGNOSIS — M79609 Pain in unspecified limb: Secondary | ICD-10-CM | POA: Diagnosis not present

## 2013-07-27 DIAGNOSIS — D62 Acute posthemorrhagic anemia: Secondary | ICD-10-CM | POA: Diagnosis not present

## 2013-07-27 DIAGNOSIS — R279 Unspecified lack of coordination: Secondary | ICD-10-CM | POA: Diagnosis not present

## 2013-07-27 DIAGNOSIS — F0280 Dementia in other diseases classified elsewhere without behavioral disturbance: Secondary | ICD-10-CM | POA: Diagnosis not present

## 2013-07-27 DIAGNOSIS — R269 Unspecified abnormalities of gait and mobility: Secondary | ICD-10-CM | POA: Diagnosis not present

## 2013-07-27 DIAGNOSIS — S72143A Displaced intertrochanteric fracture of unspecified femur, initial encounter for closed fracture: Secondary | ICD-10-CM | POA: Diagnosis not present

## 2013-07-27 DIAGNOSIS — I10 Essential (primary) hypertension: Secondary | ICD-10-CM | POA: Diagnosis not present

## 2013-07-27 DIAGNOSIS — F0390 Unspecified dementia without behavioral disturbance: Secondary | ICD-10-CM | POA: Diagnosis not present

## 2013-07-27 DIAGNOSIS — D649 Anemia, unspecified: Secondary | ICD-10-CM | POA: Diagnosis not present

## 2013-07-27 DIAGNOSIS — H5440 Blindness, one eye, unspecified eye: Secondary | ICD-10-CM | POA: Diagnosis not present

## 2013-07-27 DIAGNOSIS — M810 Age-related osteoporosis without current pathological fracture: Secondary | ICD-10-CM | POA: Diagnosis not present

## 2013-07-27 DIAGNOSIS — S72009A Fracture of unspecified part of neck of unspecified femur, initial encounter for closed fracture: Secondary | ICD-10-CM | POA: Diagnosis not present

## 2013-07-27 DIAGNOSIS — E119 Type 2 diabetes mellitus without complications: Secondary | ICD-10-CM | POA: Diagnosis not present

## 2013-07-27 DIAGNOSIS — S72009D Fracture of unspecified part of neck of unspecified femur, subsequent encounter for closed fracture with routine healing: Secondary | ICD-10-CM | POA: Diagnosis not present

## 2013-07-27 LAB — CBC
HEMATOCRIT: 29.9 % — AB (ref 36.0–46.0)
HEMOGLOBIN: 9.5 g/dL — AB (ref 12.0–15.0)
MCH: 27.7 pg (ref 26.0–34.0)
MCHC: 31.8 g/dL (ref 30.0–36.0)
MCV: 87.2 fL (ref 78.0–100.0)
Platelets: 201 10*3/uL (ref 150–400)
RBC: 3.43 MIL/uL — ABNORMAL LOW (ref 3.87–5.11)
RDW: 14.7 % (ref 11.5–15.5)
WBC: 7.9 10*3/uL (ref 4.0–10.5)

## 2013-07-27 LAB — URINE CULTURE

## 2013-07-27 LAB — TYPE AND SCREEN
ABO/RH(D): B POS
Antibody Screen: NEGATIVE
UNIT DIVISION: 0
Unit division: 0

## 2013-07-27 LAB — GLUCOSE, CAPILLARY
GLUCOSE-CAPILLARY: 120 mg/dL — AB (ref 70–99)
Glucose-Capillary: 164 mg/dL — ABNORMAL HIGH (ref 70–99)

## 2013-07-27 MED ORDER — POLYETHYLENE GLYCOL 3350 17 G PO PACK: 17.0000 g | PACK | Freq: Every day | ORAL | Status: DC | PRN

## 2013-07-27 MED ORDER — BISACODYL 10 MG RE SUPP: 10.0000 mg | Freq: Every day | RECTAL | Status: DC | PRN

## 2013-07-27 MED ORDER — ALUM & MAG HYDROXIDE-SIMETH 200-200-20 MG/5ML PO SUSP: 30.0000 mL | Freq: Four times a day (QID) | ORAL | Status: DC | PRN

## 2013-07-27 MED ORDER — CIPROFLOXACIN HCL 500 MG PO TABS: 500.0000 mg | ORAL_TABLET | Freq: Every day | ORAL | Status: AC

## 2013-07-27 MED ORDER — METHOCARBAMOL 500 MG PO TABS: 500.0000 mg | ORAL_TABLET | Freq: Four times a day (QID) | ORAL | Status: DC | PRN

## 2013-07-27 MED ORDER — ENOXAPARIN SODIUM 30 MG/0.3ML ~~LOC~~ SOLN: 30.0000 mg | SUBCUTANEOUS | Status: DC

## 2013-07-27 MED ORDER — TRAMADOL HCL 50 MG PO TABS: 50.0000 mg | ORAL_TABLET | Freq: Four times a day (QID) | ORAL | Status: DC | PRN

## 2013-07-27 MED ORDER — DSS 100 MG PO CAPS: 100.0000 mg | ORAL_CAPSULE | Freq: Two times a day (BID) | ORAL | Status: DC

## 2013-07-27 MED ORDER — ACETAMINOPHEN 325 MG PO TABS: 650.0000 mg | ORAL_TABLET | Freq: Four times a day (QID) | ORAL | Status: DC | PRN

## 2013-07-27 NOTE — Progress Notes (Signed)
   Subjective: 3 Days Post-Op Procedure(s) (LRB): INTRAMEDULLARY (IM) NAIL INTERTROCHANTRIC (Right) Patient reports pain as mild.   Patient seen in rounds by Dr. Wynelle Link. Patient is well, but has had some minor complaints of pain in the hip, requiring pain medications Plan is to go Skilled nursing facility after hospital stay once she is ready from a medical standpoint. She may be transferred from ortho standpoint at this time.  Objective: Vital signs in last 24 hours: Temp:  [98 F (36.7 C)-98.4 F (36.9 C)] 98.4 F (36.9 C) (03/02 0540) Pulse Rate:  [65-72] 65 (03/02 0540) Resp:  [16-20] 18 (03/02 0724) BP: (135-178)/(51-75) 178/75 mmHg (03/02 0540) SpO2:  [94 %-97 %] 94 % (03/02 0540)  Intake/Output from previous day:  Intake/Output Summary (Last 24 hours) at 07/27/13 1015 Last data filed at 07/27/13 0540  Gross per 24 hour  Intake    120 ml  Output      0 ml  Net    120 ml    Intake/Output this shift:    Labs:  Recent Labs  07/24/13 1205 07/25/13 0447 07/25/13 1236 07/26/13 0455 07/27/13 0405  HGB 11.8* 7.9* 7.8* 9.9* 9.5*    Recent Labs  07/26/13 0455 07/27/13 0405  WBC 7.8 7.9  RBC 3.46* 3.43*  HCT 29.7* 29.9*  PLT 190 201    Recent Labs  07/25/13 0447 07/26/13 0455  NA 145 142  K 4.2 3.9  CL 109 104  CO2 26 26  BUN 21 24*  CREATININE 1.10 1.09  GLUCOSE 146* 117*  CALCIUM 8.8 8.8    Recent Labs  07/24/13 1205  INR 1.02    EXAM General - Patient is Alert Extremity - Neurovascular intact Sensation intact distally Dressing/Incision - clean, dry, no drainage Motor Function - intact, moving foot and toes well on exam.   Past Medical History  Diagnosis Date  . Chicken pox as a child  . Measles as a child  . IBS (irritable bowel syndrome)   . Diabetes mellitus 47    type 2  . Hyperlipidemia 72  . Hypertension 72  . Vision loss of right eye   . Vertigo 2010    benign  . Calcification of cartilage     ear  . Cardiovascular  system disease 07/02/2011  . Hemorrhoid 07/02/2011  . Osteoporosis 07/02/2011  . Weight loss 07/31/2011  . Anemia 05/22/2012  . Broken wrist 10-22-11    left  . Depression 05/22/2012  . Dermatitis 05/22/2012    Right neck    Assessment/Plan: 3 Days Post-Op Procedure(s) (LRB): INTRAMEDULLARY (IM) NAIL INTERTROCHANTRIC (Right) Active Problems:   Hypertension   Hyperlipidemia   Pacemaker-St.Jude   Hip fracture   Thyroid mass of unclear etiology   Fall at home   Postoperative anemia due to acute blood loss  Estimated body mass index is 22.07 kg/(m^2) as calculated from the following:   Height as of this encounter: 5' (1.524 m).   Weight as of this encounter: 51.256 kg (113 lb). Discharge to SNF when medically ready.  DVT Prophylaxis - Lovenox for a total of ten days and then take a baby ASA 81 mg daily for four more weeks. Weight Bearing As Tolerated right Leg F/U in two weeks with Dr. Wynelle Link Meds - Tramadol and Tylenol  PERKINS, ALEXZANDREW 07/27/2013, 10:15 AM

## 2013-07-27 NOTE — Progress Notes (Signed)
Physical Therapy Treatment Patient Details Name: Briana Thornton MRN: [id number] DOB: 04/27/1927 Today's Date: 07/27/2013 Time: 6606-3016 PT Time Calculation (min): 27 min  PT Assessment / Plan / Recommendation  History of Present Illness R hip IM nail   PT Comments   Pt progressing well, able to incr amb today;  Will benefit from SNF level therapies  Follow Up Recommendations  SNF     Does the patient have the potential to tolerate intense rehabilitation     Barriers to Discharge        Equipment Recommendations  None recommended by PT    Recommendations for Other Services    Frequency Min 5X/week   Progress towards PT Goals Progress towards PT goals: Progressing toward goals  Plan Current plan remains appropriate    Precautions / Restrictions Precautions Precautions: Fall Restrictions Weight Bearing Restrictions: No RLE Weight Bearing: Weight bearing as tolerated   Pertinent Vitals/Pain No c/o pain    Mobility  Transfers Overall transfer level: Needs assistance Equipment used: Rolling walker (2 wheeled) Transfers: Sit to/from Stand Sit to Stand: Min assist;+2 physical assistance General transfer comment: pt able to take several steps with weight on R leg to get to bed, assist to move R le at times,control descent, cues for hand placement, encouragement Ambulation/Gait Ambulation/Gait assistance: Min assist Ambulation Distance (Feet): 20 Feet Assistive device: Rolling walker (2 wheeled) Gait Pattern/deviations: Step-to pattern General Gait Details: assist for balance, cues for sequence (step by step), posture, use of UEs; assist to advance RLE    Exercises     PT Diagnosis:    PT Problem List:   PT Treatment Interventions:     PT Goals (current goals can now be found in the care plan section) Acute Rehab PT Goals Patient Stated Goal: per daughter , to go to rehab PT Goal Formulation: With patient/family Time For Goal Achievement: 08/08/13 Potential to  Achieve Goals: Good  Visit Information  Last PT Received On: 07/27/13 Assistance Needed: +2 History of Present Illness: R hip IM nail    Subjective Data  Patient Stated Goal: per daughter , to go to rehab   Cognition  Cognition Arousal/Alertness: Awake/alert Behavior During Therapy: WFL for tasks assessed/performed Overall Cognitive Status: History of cognitive impairments - at baseline    Balance     End of Session PT - End of Session Equipment Utilized During Treatment: Gait belt Activity Tolerance: Patient tolerated treatment well Patient left: in chair;with call bell/phone within reach;with family/visitor present Nurse Communication: Mobility status   GP     [facility] 07/27/2013, 9:48 AM

## 2013-07-27 NOTE — Discharge Instructions (Signed)
Hip Fracture, Open Reduction and Internal Fixation (ORIF) A hip fracture, or broken hip, can happen to anyone. To fix it, surgery is usually needed. One method is called open reduction and internal fixation, or ORIF for short. "Open reduction" means an incision (cut) is made to open the fracture area. This lets the surgeon see the broken bone. The bone pieces will be put back together. Some type of hardware will be used to hold the bones in place. That is called "internal fixation." Screws, pins, rods or a metal plate might be used. More than 250,000 people in the [place] break a hip every year. Nearly all of them are treated successfully with surgery. LET YOUR CAREGIVER KNOW ABOUT : On the day of your surgery, your caregivers will need to know the last time you had anything to eat or drink. This includes water, gum and candy. Also make sure they know about:   Any allergies.  All medications you are taking, including:  Herbs, eyedrops, over-the-counter medications and creams.  Blood thinners (anticoagulants), aspirin or other drugs that could affect blood clotting.  Use of steroids (by mouth or as creams).  Previous problems with anesthesia, including local anesthetics.  Possibility of pregnancy, if this applies.  Any history of blood clots.  Any history of bleeding or other blood problems.  Previous surgery.  Family history of anesthetic complications  Smoking history.  Any recent symptoms of colds or infections.  Other health problems. RISKS AND COMPLICATIONS  All operations have some risk. Being unhealthy increases risks. That is why you want to be as healthy as possible before this surgery. Possible problems after ORIF may include:  Blood clots.  Bleeding.  Infection near the incision.  Lung infection (pneumonia).  Pain that continues after the operation.  Trouble walking. Some people may need to continue using a walker. BEFORE THE PROCEDURE You should be as  healthy as possible before surgery for a broken hip. Sometimes this means waiting until other health problems are addressed. Then, the operation can be scheduled. To find out if you are ready for surgery:  A medical evaluation will be done. This examination will include checking your heart and lungs.  Imaging tests. These let the surgeon see what the fracture looks like. They could include:  X-rays to find exactly where the break is.  Computed tomography (CT) scan. A CT scan takes pictures using X-rays and a computer. This can give a better view of the broken hip.  Magnetic resonance imaging (MRI scan). It uses a magnet, radio waves and a computer. It may show a hidden fracture that cannot be seen on X-ray or CT.  Blood tests.  Urine test. It is possible to have a urinary tract infection and not know it.  Talking with an anesthesiologist. This is the person who will be in charge of the anesthesia (medication to stop the pain) during the surgery. An ORIF procedure usually is done with general anesthesia (being asleep during surgery), or a spinal anesthesia is used to make you numb (no feeling) from the waist down but awake during the operation. Ask your surgeon if there is an advantage to one type of anesthetic over the other. You will need to stop taking certain medicines.  The admitting physician will have you stop using aspirin and non-steroidal anti-inflammatory drugs (NSAIDs) for pain relief. This includes prescription drugs and over-the-counter drugs such as ibuprofen and naproxen.  If you take blood-thinners, ask your healthcare provider when you should stop taking  them. You will have to give what is called informed consent. This requires signing a legal paper that gives permission for the surgery. To give informed consent:  You must understand how the procedure is done and why.  You must be told all the risks and benefits of the procedure.  You must sign the consent. Or, a legal  guardian can do this.  Signing should be witnessed by a healthcare professional. The day before the surgery, eat only a light dinner. Then, do not eat or drink anything for at least 8 hours before the surgery. Ask if it is OK to take any needed medicines with a sip of water. PROCEDURE The preparation:  Small monitors will be put on your body. They are used to check your heart, blood pressure and oxygen level.  You will be given an intravenous line (IV). A needle will be inserted in your arm. It is hooked to a plastic tube. Medication will be able to flow directly into your body through the IV.  You will be given anesthesia.  For general anesthesia, the anesthesiologist may hold a mask gently over your face. You will breathe in gases that will make you sleep. A tube also might be put in your throat. This would let you continue to get anesthesia during the procedure.  For spinal anesthesia, a drug will be injected (shot) into the spinal cord area. This will make the body numb from the waist down.  The hip area will be scrubbed with a special solution to kill any germs.  The procedure:  Once you are asleep or numb, the surgeon will move the bones (realign the fracture) before any incisions are made. The goal is to get the bones back to their normal position.  X-rays may be taken. This is to check the position of the bones.  An incision is made over the hip. It will go through the muscles to the broken bone.  The bones will be put in place. Some type of hardware will be used to hold the bone together.  The hip is a ball-and-socket joint. The "ball" part of the joint is the very top of the upper leg bone (femur). Sometimes the very top of the upper leg bone is replaced with a man-made piece. If it is replaced, this is called a partial hip replacement. Sometimes a complete or total hip replacement will be preformed, replacing both the ball and the socket. This is the preferred treatment if  there is any appearance of arthritis in the hip joint.  The incision is closed with small stitches or staples.  A dressing (medicine and a bandage) is put over the incision.  An ORIF procedure can take several hours. AFTER THE PROCEDURE  You will stay in a recovery area until the anesthesia has worn off. Your blood pressure and pulse will be checked every so often. Then you will be taken to a hospital room.  You may continue to get fluids through the IV for awhile.  Some pain is normal after an ORIF procedure. You will probably be given pain medicine. Be sure to tell your caregivers if the pain becomes severe.  It is important to be up and moving as soon as possible after an operation. Physical therapists will help you start walking. You will probably need to use a walker for a while. Follow the therapists instructions regarding weight bearing on the injured leg.  To prevent blood clots in your legs:  You may be given  special stockings to wear.  You may need to take medicine to prevent clots.  Most people stay in the hospital for several days after this surgery.  Physical therapy is usually needed. Some people go to a rehabilitation center (a long-term care center or transitional care unit) before going home. Ask your healthcare providers what would be best for you. Often social workers are available to help you and your family make the best decision for you. HOME CARE INSTRUCTIONS   Medication.  Take any pain medicine that your surgeon suggests. Follow the directions carefully. Do not take over-the-counter painkillers unless the surgeon says it is OK. Medicine such as aspirin or ibuprofen can increase the chances of bleeding.  Your healthcare provider may prescribe a blood-thinner for several weeks to 2 months. These drugs prevent blood clots.  Wound care.  Check the area around the incision carefully each day. Look for any redness or swelling. Also check for any fluid that is  seeping from the incision. Tell your healthcare provider if you see anything.  Do not get the incision wet until your surgeon says it is OK.  Activity.  Most people will need the help of a walker or crutches for some time.  You will need to continue physical therapy once you are home. This often lasts for several months.  You will learn how to avoid putting stress on your hip while it heals if that is the direction given by your surgeon.  Be sure to do any exercises the therapist suggests. These exercises will help make your hip stronger.  Special equipment might make life at home easier. One example is a seat for the shower. Another is a raised toilet seat.  Ask your healthcare provider when you can resume other activities, such as work, driving or sex.  Follow-up care.  The surgeon may need to take out stitches or staples. This is usually done about two weeks after the operation.  The surgeon will do X-rays to check how your hip is healing. SEEK MEDICAL CARE IF:   You have any questions about medications.  You feel weak.  You are too tired to walk every day.  Pain continues, even after taking pain medicine.  You develop a fever of more than 100.5 F (38.1 C). SEEK IMMEDIATE MEDICAL CARE IF:   The incision becomes red or swollen. Or, it bleeds.  Your leg or foot becomes painful and swollen.  Your leg becomes pale or blue. It feels cold. It tingles or is numb.  You have trouble breathing.  You have chest pain.  You develop a fever of more than 102 F (38.9 C). Document Released: 05/02/2009 Document Revised: 08/06/2011 Document Reviewed: 05/02/2009 [facility] Patient Information 2014 [place], [place].  Pick up stool softner and laxative for home. Do not submerge incision under water. May shower. Continue to use ice for pain and swelling from surgery.  Continue Lovenox injections for a total of ten days postop and then switch over to a baby Aspirin 81 mg daily for  four more weeks.

## 2013-07-27 NOTE — Progress Notes (Signed)
Pt being transferred to [facility]. Attempted to give report to the nurse x2. Was disconnected both attempts.

## 2013-07-27 NOTE — Discharge Summary (Signed)
Physician Discharge Summary  Briana Thornton [id number] DOB: 22-Aug-1926 DOA: 07/24/2013  PCP: Penni Homans, MD  Admit date: 07/24/2013 Discharge date: 07/27/2013  Time spent: 35 minutes  Recommendations for Outpatient Follow-up:  1. Follow up on a CBC in 1 week 2. Please obtain accuchecks qac 3. Susceptibility testing of E.coli from urine culture will need to be followed up, on discharge however she was afebrile, with urine clearing up. Will discharge her on Cipro for now.  4. Will need follow up Thyroid US in 6-12 months.   Discharge Diagnoses:  Active Problems:   Hip fracture   Fall at home   Thyroid mass of unclear etiology   Postoperative anemia due to acute blood loss   Hypertension   Hyperlipidemia   Pacemaker-[employer]   Discharge Condition: Stable  Diet recommendation: Regular Diet  Filed Weights   07/25/13 1214  Weight: 51.256 kg (113 lb)    History of present illness:  Briana Thornton is a 80 y.o. female with a past medical history of complete heart block status post permanent pacemaker implant, diabetes, dyslipidemia, cognitive impairment, having a history of recurrent falls who was brought to the emergency Department at [facility] by EMS. Patient was in her usual state health, getting up at approximately 2:00 in the morning to use the restroom, having a fall with her walker along the way. She denied any symptoms prior to the fall and feels she simply may have lost her balance. She landed on a carpeted surface. Her daughter and son-in-law heard her fall, found her on the floor and helped her back in bed. This morning she was unable to get a bed, unable to bear weight on her right lower extremity, reporting severe pain to right hip. Imaging studies performed in the emergency department showing the development of an intertrochanteric fracture of the right femur. She also had a CT scan of head and neck which revealed the presence of a right paraesophageal soft tissue  mass which could represent a thyroid mass, parathyroid mass and or enlargement of lymph node. Dr. Leonides Schanz of emergency medicine spoke with Dr. Maureen Ralphs of orthopedic surgery, patient likely to undergo ORIF this evening. She denies chest pain or shortness of breath on exertion, palpitations, syncope, dizziness, lightheadedness.   Hospital Course:  Patient is a pleasant 80 year old female with a past medical history of complete heart block status post permanent pacemaker implant, type 2 diabetes mellitus, dyslipidemia, cognitive impairment he was admitted to the medicine service on 07/24/2013. She presented to the emergency Department at [facility] after sustaining a fall the evening prior. She reported having a fall at home on her way to the bathroom landing on carpet surface. Imaging studies showed the development of an intertrochanteric fracture of the right femur. Patient was taken to the OR on 07/24/2013 undergoing intramedullary nailing. She tolerated the procedure well there are no bleeding complications. Initial lab work also revealed the presence of a urinary tract infection, initially started on ceftriaxone, transition to oral ciprofloxacin. On the following morning her hemoglobin was noted to come down to 7.9 from 11.8 on admission. She was typed and crossed and transfused with 2 units of packed red blood cells. A CT scan of head and neck performed on admission showed the presence of a right paraesophageal soft tissue mass which could represent a thyroid mass, parathyroid mass or enlargement of a lymph node. Patient otherwise reporting doing well on the morning of 07/25/2013, was helped out of bed to bedside chair. She  is afebrile tolerating by mouth intake. A thyroid ultrasound performed on 07/26/2013 showed the presence of several thyroid nodules, largest one arising in the lower pole of the right lobe measuring 13 mm in greatest dimension. Radiology reporting that these do not meet SRU criteria  for biopsy. TSH came back within normal limits, she did not appear to have clinical signs or symptoms suggesting hyperthyroidism. Urine cultures drawn on 07/24/2013 growing greater than 100,000 colony-forming units of Escherichia coli. Given clinical stability shw was discharged to SNF on 07/27/13 in stable condition.   Consultants:  Orthopedic surgery Procedures:  Intramedullary nailing of intertrochanteric fracture right femur, procedure performed on 07/24/2013   Discharge Exam: Filed Vitals:   07/27/13 0724  BP:   Pulse:   Temp:   Resp: 18   General: Patient is in no acute distress, awake alert oriented  Cardiovascular: Regular rate and rhythm normal S1-S2, she was 6 systolic ejection murmur  Respiratory: Clear to auscultation bilaterally no wheezing rhonchi or rales  Abdomen: Soft nontender nondistended  Musculoskeletal: No edema to extremities, surgical incision site appears clean, no evidence of infection or hematoma   Discharge Instructions  Discharge Orders   Future Orders Complete By Expires   Call MD / Call 911  As directed    Comments:     If you experience chest pain or shortness of breath, CALL 911 and be transported to the hospital emergency room.  If you develope a fever above 101 F, pus (white drainage) or increased drainage or redness at the wound, or calf pain, call your surgeon's office.   Call MD for:  extreme fatigue  As directed    Call MD for:  persistant dizziness or light-headedness  As directed    Call MD for:  persistant nausea and vomiting  As directed    Call MD for:  redness, tenderness, or signs of infection (pain, swelling, redness, odor or green/yellow discharge around incision site)  As directed    Call MD for:  severe uncontrolled pain  As directed    Call MD for:  temperature >100.4  As directed    Change dressing  As directed    Comments:     You may change your dressing dressing daily with sterile 4 x 4 inch gauze dressing and paper tape.  Do  not submerge the incision under water.   Constipation Prevention  As directed    Comments:     Drink plenty of fluids.  Prune juice may be helpful.  You may use a stool softener, such as Colace (over the counter) 100 mg twice a day.  Use MiraLax (over the counter) for constipation as needed.   Diet - low sodium heart healthy  As directed    Diet Carb Modified  As directed    Discharge instructions  As directed    Comments:     Pick up stool softner and laxative for home. Do not submerge incision under water. May shower. Continue to use ice for pain and swelling from surgery.  Continue Lovenox injections for a total of ten days postop and then take a baby Aspirin 81 mg daily for four more weeks.   Do not sit on low chairs, stoools or toilet seats, as it may be difficult to get up from low surfaces  As directed    Driving restrictions  As directed    Comments:     No driving until released by the physician.   Follow the hip precautions as taught  in Physical Therapy  As directed    Increase activity slowly as tolerated  As directed    Increase activity slowly  As directed    Lifting restrictions  As directed    Comments:     No lifting until released by the physician.   Patient may shower  As directed    Comments:     You may shower without a dressing once there is no drainage.  Do not wash over the wound.  If drainage remains, do not shower until drainage stops.   TED hose  As directed    Comments:     Use stockings (TED hose) for 3 weeks on both leg(s).  You may remove them at night for sleeping.   Weight bearing as tolerated  As directed    Questions:     Laterality:     Extremity:         Medication List    STOP taking these medications       glucose blood test strip  Commonly known as:  FREESTYLE LITE     ibuprofen 200 MG tablet  Commonly known as:  ADVIL,MOTRIN      TAKE these medications       acetaminophen 325 MG tablet  Commonly known as:  TYLENOL  Take 2  tablets (650 mg total) by mouth every 6 (six) hours as needed for mild pain (or Fever >/= 101).     alum & mag hydroxide-simeth 200-200-20 MG/5ML suspension  Commonly known as:  MAALOX/MYLANTA  Take 30 mLs by mouth every 6 (six) hours as needed for indigestion or heartburn (dyspepsia).     atorvastatin 10 MG tablet  Commonly known as:  LIPITOR  Take 1 tablet (10 mg total) by mouth daily.     bisacodyl 10 MG suppository  Commonly known as:  DULCOLAX  Place 1 suppository (10 mg total) rectally daily as needed for moderate constipation.     ciprofloxacin 500 MG tablet  Commonly known as:  CIPRO  Take 1 tablet (500 mg total) by mouth daily with breakfast.     donepezil 10 MG tablet  Commonly known as:  ARICEPT  Take 10 mg by mouth daily after breakfast.     DSS 100 MG Caps  Take 100 mg by mouth 2 (two) times daily.     enoxaparin 30 MG/0.3ML injection  Commonly known as:  LOVENOX  Inject 0.3 mLs (30 mg total) into the skin daily.     metFORMIN 500 MG 24 hr tablet  Commonly known as:  GLUCOPHAGE-XR  Take 1 tablet (500 mg total) by mouth daily with breakfast.     methocarbamol 500 MG tablet  Commonly known as:  ROBAXIN  Take 1 tablet (500 mg total) by mouth every 6 (six) hours as needed for muscle spasms.     methylcellulose 1 % ophthalmic solution  Commonly known as:  ARTIFICIAL TEARS  Place 1 drop into both eyes as needed.     multivitamin with minerals Tabs tablet  Take 2 tablets by mouth daily.     neomycin-polymyxin-hydrocortisone otic solution  Commonly known as:  CORTISPORIN  Place 3 drops into both ears 3 (three) times daily. X 7 days     polyethylene glycol packet  Commonly known as:  MIRALAX / GLYCOLAX  Take 17 g by mouth daily as needed for mild constipation.     PROBIOTIC DAILY PO  Take 1 tablet by mouth daily.     traMADol 50 MG tablet  Commonly known as:  ULTRAM  Take 1 tablet (50 mg total) by mouth every 6 (six) hours as needed for moderate pain.        No Known Allergies     Follow-up Information   Follow up with Gearlean Alf, MD. Schedule an appointment as soon as possible for a visit in 2 weeks.   Specialty:  Orthopedic Surgery   Contact information:   [address] [place] [address] [phone]        The results of significant diagnostics from this hospitalization (including imaging, microbiology, ancillary and laboratory) are listed below for reference.    Significant Diagnostic Studies: Dg Chest 1 View  07/24/2013   CLINICAL DATA:  80 year old female preoperative study for hip fracture. Initial encounter. History of diabetes hypertension and pacemaker.  EXAM: CHEST - 1 VIEW  COMPARISON:  None.  FINDINGS: Portable AP supine view at 1132 hrs. Left chest dual lead cardiac pacemaker. Somewhat low lung volumes. Mild cardiomegaly. Other mediastinal contours are within normal limits. Calcified atherosclerosis of the aorta. Suggestion also of some calcified hilar lymph nodes. No pneumothorax, pulmonary edema, pleural effusion or consolidation. Chronic left clavicle fracture. Severe lower thoracic compression fracture. Osteopenia.  IMPRESSION: 1.  No acute cardiopulmonary abnormality. 2. Mild cardiomegaly. Severe lower thoracic compression fracture. Suggestion of previous left lung granulomatous disease.   Electronically Signed   By: Lars Pinks M.D.   On: 07/24/2013 11:48   Dg Hip Complete Right  07/24/2013   CLINICAL DATA:  Fall.  Right hip pain.  EXAM: RIGHT HIP - COMPLETE 2+ VIEW  COMPARISON:  10/20/2011  FINDINGS: There is a mildly displaced intertrochanteric fracture of the right femur with varus angulation. The femoral head remains approximated with the acetabulum. The visualized proximal left femur is grossly unremarkable. Dystrophic calcification in the right pelvis is unchanged and likely represents a uterine fibroid.  IMPRESSION: Intertrochanteric fracture of the right femur.   Electronically Signed   By:  Logan Bores   On: 07/24/2013 11:51   Dg Hip Operative Right  07/24/2013   CLINICAL DATA:  Right hip IM nail  EXAM: DG OPERATIVE RIGHT HIP  TECHNIQUE: A single spot fluoroscopic AP image of the right hip is submitted.  COMPARISON:  Right hip radiographs dated 07/24/2013 at 1126 hr  FINDINGS: Intraoperative fluoroscopic spot images were obtained and have been submitted for interpretation.  IM nail with dynamic hip screw fixation of an intertrochanteric right hip fracture, with fracture fragments in near anatomic alignment and position.  IMPRESSION: Intraoperative radiographs during ORIF of the right hip, as described above.   Electronically Signed   By: Julian Hy M.D.   On: 07/24/2013 20:03   Ct Cervical Spine Wo Contrast  07/24/2013   CLINICAL DATA:  Fall  EXAM: CT HEAD WITHOUT CONTRAST  CT CERVICAL SPINE WITHOUT CONTRAST  TECHNIQUE: Multidetector CT imaging of the head and cervical spine was performed following the standard protocol without intravenous contrast. Multiplanar CT image reconstructions of the cervical spine were also generated.  COMPARISON:  None.  FINDINGS: CT HEAD FINDINGS  Generalized atrophy. Chronic microvascular ischemic change in the white matter. Chronic lacune in the right putamen. Negative for acute infarct. Negative for hemorrhage.  18 mm ossified mass sub frontal region on the right adjacent to the falx. This may be attached to the cribriform plate and most likely is a meningioma. Adjacent frontal sinuses clear. No acute bony change. Negative for skull fracture.  CT CERVICAL SPINE FINDINGS  Negative  for cervical spine fracture. Normal alignment. Mild spondylosis at C4-5, C5-6, C6-7.  Right paraesophageal soft tissue mass measures 16 mm and is similar density to thyroid. This most likely is a thyroid mass. Other possibilities would include a parathyroid adenoma and adenopathy. Thyroid overall is not enlarged. No cervical adenopathy elsewhere.  IMPRESSION: Atrophy and chronic  microvascular ischemia. 18 mm calcified sub frontal mass in the right most likely a meningioma.  No acute intracranial abnormality.  Cervical spondylosis without fracture.  Right paraesophageal soft tissue mass may represent a thyroid mass, parathyroid mass, or and enlarged lymph node.   Electronically Signed   By: Franchot Gallo M.D.   On: 07/24/2013 11:26   US Soft Tissue Head/neck  07/25/2013   CLINICAL DATA:  ?Thyroid mass  EXAM: THYROID ULTRASOUND  TECHNIQUE: Ultrasound examination of the thyroid gland and adjacent soft tissues was performed.  COMPARISON:  None.  FINDINGS: Right thyroid lobe  Measurements: 5.3 cm x 2.3 cm x 1 point dizziness. Gland is heterogeneous in echogenicity. There is a hypoechoic nodule in the posterior midpole measuring 6 mm x 5 mm x 5 mm. There is a heterogeneous nearly isoechoic nodule from the lower pole which measures 13 mm x 10 mm x 13 mm. No other discrete nodules.  Left thyroid lobe  Measurements: 5.1 cm x 2.3 cm x 1.5 cm. Gland is heterogeneous in echogenicity. There are few tiny hypoechoic/cystic nodules.  Isthmus  Thickness: 3 mm. Oval hypoechoic nodule resides along the right aspect of the isthmus measuring 9 mm x 4 mm x 8 mm.  Lymphadenopathy  None visualized.  IMPRESSION: 1. Thyroid gland is borderline enlarged and is diffusely heterogeneous in its echotexture. 2. There are several thyroid nodules. Largest arises from the lower pole of the right lobe, solid and nearly isoechoic, measuring 13 mm in greatest dimension. Findings do not meet current SRU consensus criteria for biopsy. Follow-up by clinical exam is recommended. If patient has known risk factors for thyroid carcinoma, consider follow-up ultrasound in 12 months. If patient is clinically hyperthyroid, consider nuclear medicine thyroid uptake and scan.Reference: Management of Thyroid Nodules Detected at [place]: Society of Radiologists in [place]. Radiology 2005; [id number].    Electronically Signed   By: Lajean Manes M.D.   On: 07/25/2013 17:30    Microbiology: Recent Results (from the past 240 hour(s))  URINE CULTURE     Status: None   Collection Time    07/24/13 11:52 AM      Result Value Ref Range Status   Specimen Description URINE, CLEAN CATCH   Final   Special Requests NONE   Final   Culture  Setup Time     Final   Value: 07/24/2013 18:22     Performed at [facility]     Final   Value: >=100,000 COLONIES/ML     Performed at [facility]   Culture     Final   Value: ESCHERICHIA COLI     Performed at [facility]   Report Status PENDING   Incomplete  SURGICAL PCR SCREEN     Status: None   Collection Time    07/24/13  3:58 PM      Result Value Ref Range Status   MRSA, PCR NEGATIVE  NEGATIVE Final   Staphylococcus aureus NEGATIVE  NEGATIVE Final   Comment:            The Xpert SA Assay (FDA     approved  for NASAL specimens     in patients over 55 years of age),     is one component of     a comprehensive surveillance     program.  Test performance has     been validated by [employer] for patients greater     than or equal to 5 year old.     It is not intended     to diagnose infection nor to     guide or monitor treatment.     Labs: Basic Metabolic Panel:  Recent Labs Lab 07/24/13 1205 07/25/13 0447 07/26/13 0455  NA 142 145 142  K 3.9 4.2 3.9  CL 104 109 104  CO2 27 26 26   GLUCOSE 132* 146* 117*  BUN 21 21 24*  CREATININE 0.97 1.10 1.09  CALCIUM 9.7 8.8 8.8   Liver Function Tests: No results found for this basename: AST, ALT, ALKPHOS, BILITOT, PROT, ALBUMIN,  in the last 168 hours No results found for this basename: LIPASE, AMYLASE,  in the last 168 hours No results found for this basename: AMMONIA,  in the last 168 hours CBC:  Recent Labs Lab 07/24/13 1205 07/25/13 0447 07/25/13 1236 07/26/13 0455 07/27/13 0405  WBC 10.3 8.8  --  7.8 7.9  NEUTROABS 8.7*  --   --   --    --   HGB 11.8* 7.9* 7.8* 9.9* 9.5*  HCT 36.5 24.2* 23.5* 29.7* 29.9*  MCV 88.6 89.3  --  85.8 87.2  PLT 252 220  --  190 201   Cardiac Enzymes: No results found for this basename: CKTOTAL, CKMB, CKMBINDEX, TROPONINI,  in the last 168 hours BNP: BNP (last 3 results) No results found for this basename: PROBNP,  in the last 8760 hours CBG:  Recent Labs Lab 07/25/13 2145 07/26/13 0738 07/26/13 1133 07/26/13 1638 07/27/13 0714  GLUCAP 153* 112* 156* 169* 120*       Signed:  Seymore Brodowski  [facility] 07/27/2013, 11:12 AM

## 2013-07-29 ENCOUNTER — Encounter: Payer: Self-pay | Admitting: *Deleted

## 2013-08-01 ENCOUNTER — Non-Acute Institutional Stay (SKILLED_NURSING_FACILITY): Payer: Medicare Other | Admitting: Internal Medicine

## 2013-08-01 DIAGNOSIS — D62 Acute posthemorrhagic anemia: Secondary | ICD-10-CM

## 2013-08-01 DIAGNOSIS — E785 Hyperlipidemia, unspecified: Secondary | ICD-10-CM

## 2013-08-01 DIAGNOSIS — S72009A Fracture of unspecified part of neck of unspecified femur, initial encounter for closed fracture: Secondary | ICD-10-CM | POA: Diagnosis not present

## 2013-08-01 DIAGNOSIS — I10 Essential (primary) hypertension: Secondary | ICD-10-CM

## 2013-08-01 DIAGNOSIS — N390 Urinary tract infection, site not specified: Secondary | ICD-10-CM

## 2013-08-01 DIAGNOSIS — F0390 Unspecified dementia without behavioral disturbance: Secondary | ICD-10-CM

## 2013-08-01 DIAGNOSIS — E0789 Other specified disorders of thyroid: Secondary | ICD-10-CM

## 2013-08-01 DIAGNOSIS — D497 Neoplasm of unspecified behavior of endocrine glands and other parts of nervous system: Secondary | ICD-10-CM

## 2013-08-06 ENCOUNTER — Encounter: Payer: Self-pay | Admitting: Internal Medicine

## 2013-08-06 DIAGNOSIS — N390 Urinary tract infection, site not specified: Secondary | ICD-10-CM | POA: Insufficient documentation

## 2013-08-06 DIAGNOSIS — F0390 Unspecified dementia without behavioral disturbance: Secondary | ICD-10-CM | POA: Insufficient documentation

## 2013-08-06 NOTE — Assessment & Plan Note (Signed)
  Admitted for OT/PT; pain meds, robaxin and prophylaxed with Lovenox until 3/9 then asa 81MG  DAILY FOR 4 WEEKS.

## 2013-08-06 NOTE — Progress Notes (Signed)
MRN: [id number] Name: Briana Thornton  Sex: female Age: 80 y.o. DOB: 01-08-1927  [place] #: [facility] Facility/Room: 956 Level Of Care: SNF Provider: Inocencio Homes D Emergency Contacts: Extended Emergency Contact Information Primary Emergency Contact: Butler Denmark Address: [address]          [place], [place] [address] Johnnette Litter of [place] Phone: [phone] Work Phone: [phone] Mobile Phone: [phone] Relation: Daughter Secondary Emergency Contact: Jennette Banker Address: [address]          [place],  [address] Johnnette Litter of [place] Phone: [phone] Relation: Other     Allergies: Review of patient's allergies indicates no known allergies.  Chief Complaint  Patient presents with  . nursing home admission    HPI: Patient is 80 y.o. female who is s/p repair for R hip fracture who is admitted for OT/PT.  Past Medical History  Diagnosis Date  . Chicken pox as a child  . Measles as a child  . IBS (irritable bowel syndrome)   . Diabetes mellitus 47    type 2  . Hyperlipidemia 72  . Hypertension 72  . Vision loss of right eye   . Vertigo 2010    benign  . Calcification of cartilage     ear  . Cardiovascular system disease 07/02/2011  . Hemorrhoid 07/02/2011  . Osteoporosis 07/02/2011  . Weight loss 07/31/2011  . Anemia 05/22/2012  . Broken wrist 10-22-11    left  . Depression 05/22/2012  . Dermatitis 05/22/2012    Right neck    Past Surgical History  Procedure Laterality Date  . Hemorroidectomy  1979  . Knee scoped  2002    right  . Rotator cuff repair  2004    right  . Abdominal hysterectomy  2008    partial still has ovaries  . Lens implant left  2006  . Pacemaker insertion  2010  . Wrist surgery  11-05-11    left wrist  . Intramedullary (im) nail intertrochanteric Right 07/24/2013    Procedure: INTRAMEDULLARY (IM) NAIL INTERTROCHANTRIC;  Surgeon: Gearlean Alf, MD;  Location: [facility];  Service: Orthopedics;   Laterality: Right;      Medication List       This list is accurate as of: 08/01/13 11:59 PM.  Always use your most recent med list.               acetaminophen 325 MG tablet  Commonly known as:  TYLENOL  Take 2 tablets (650 mg total) by mouth every 6 (six) hours as needed for mild pain (or Fever >/= 101).     alum & mag hydroxide-simeth 200-200-20 MG/5ML suspension  Commonly known as:  MAALOX/MYLANTA  Take 30 mLs by mouth every 6 (six) hours as needed for indigestion or heartburn (dyspepsia).     atorvastatin 10 MG tablet  Commonly known as:  LIPITOR  Take 1 tablet (10 mg total) by mouth daily.     bisacodyl 10 MG suppository  Commonly known as:  DULCOLAX  Place 1 suppository (10 mg total) rectally daily as needed for moderate constipation.     ciprofloxacin 500 MG tablet  Commonly known as:  CIPRO  Take 500 mg by mouth 2 (two) times daily.     donepezil 10 MG tablet  Commonly known as:  ARICEPT  Take 10 mg by mouth daily after breakfast.     DSS 100 MG Caps  Take 100 mg by mouth 2 (two) times daily.     enoxaparin 30  MG/0.3ML injection  Commonly known as:  LOVENOX  Inject 0.3 mLs (30 mg total) into the skin daily.     metFORMIN 500 MG 24 hr tablet  Commonly known as:  GLUCOPHAGE-XR  Take 1 tablet (500 mg total) by mouth daily with breakfast.     methocarbamol 500 MG tablet  Commonly known as:  ROBAXIN  Take 1 tablet (500 mg total) by mouth every 6 (six) hours as needed for muscle spasms.     methylcellulose 1 % ophthalmic solution  Commonly known as:  ARTIFICIAL TEARS  Place 1 drop into both eyes as needed.     multivitamin with minerals Tabs tablet  Take 2 tablets by mouth daily.     neomycin-polymyxin-hydrocortisone otic solution  Commonly known as:  CORTISPORIN  Place 3 drops into both ears 3 (three) times daily. X 7 days     polyethylene glycol packet  Commonly known as:  MIRALAX / GLYCOLAX  Take 17 g by mouth daily as needed for mild constipation.      PROBIOTIC DAILY PO  Take 1 tablet by mouth daily.     traMADol 50 MG tablet  Commonly known as:  ULTRAM  Take 1 tablet (50 mg total) by mouth every 6 (six) hours as needed for moderate pain.        Meds ordered this encounter  Medications  . ciprofloxacin (CIPRO) 500 MG tablet    Sig: Take 500 mg by mouth 2 (two) times daily.    Immunization History  Administered Date(s) Administered  . Influenza Split 02/26/2012  . Influenza Whole 02/26/2011  . Influenza, High Dose Seasonal PF 03/02/2013  . Tdap 07/02/2011    History  Substance Use Topics  . Smoking status: Never Smoker   . Smokeless tobacco: Never Used  . Alcohol Use: No    Family history is noncontributory    Review of Systems  DATA OBTAINED: from patient GENERAL: Feels well no fevers, fatigue, appetite changes SKIN: No itching, rash or wounds EYES: No eye pain, redness, discharge EARS: No earache, tinnitus, change in hearing NOSE: No congestion, drainage or bleeding  MOUTH/THROAT: No mouth or tooth pain, No sore throat RESPIRATORY: No cough, wheezing, SOB CARDIAC: No chest pain, palpitations, lower extremity edema  GI: No abdominal pain, No N/V/D or constipation, No heartburn or reflux  GU: No dysuria, frequency or urgency, or incontinence  MUSCULOSKELETAL: No unrelieved bone/joint pain NEUROLOGIC: No headache, dizziness or focal weakness PSYCHIATRIC: No overt anxiety or sadness. Sleeps well. No behavior issue.   Filed Vitals:   08/06/13 1947  BP: 137/59  Pulse: 65  Temp: 98.1 F (36.7 C)  Resp: 17    Physical Exam  GENERAL APPEARANCE: Alert, mod conversant. Appropriately groomed. No acute distress.  SKIN: No diaphoresis rash HEAD: Normocephalic, atraumatic  EYES: Conjunctiva/lids clear. Pupils round, reactive. EOMs intact.  EARS: External exam WNL, canals clear. Hearing grossly normal.  NOSE: No deformity or discharge.  MOUTH/THROAT: Lips w/o lesions RESPIRATORY: Breathing is even,  unlabored. Lung sounds are clear   CARDIOVASCULAR: Heart RRR no murmurs, rubs or gallops. No peripheral edema.  GASTROINTESTINAL: Abdomen is soft, non-tender, not distended w/ normal bowel sounds. GENITOURINARY: Bladder non tender, not distended  MUSCULOSKELETAL: No abnormal joints or musculature NEUROLOGIC: Oriented X3. Cranial nerves 2-12 grossly intact. Moves all extremities no tremor. PSYCHIATRIC: Mood and affect appropriate to situation, no behavioral issues  Patient Active Problem List   Diagnosis Date Noted  . UTI (urinary tract infection) 08/06/2013  . Dementia without  behavioral disturbance 08/06/2013  . Postoperative anemia due to acute blood loss 07/26/2013  . Hip fracture 07/24/2013  . Thyroid mass of unclear etiology 07/24/2013  . Fall at home 07/24/2013  . Cerumen impaction 04/21/2013  . Cognitive decline 02/15/2013  . Anemia 05/22/2012  . Depression 05/22/2012  . Dermatitis 05/22/2012  . Broken wrist   . Pacemaker-St.Jude 09/18/2011  . URI (upper respiratory infection) 08/31/2011  . Lymph node enlargement 08/31/2011  . Weight loss 07/31/2011  . Cardiovascular system disease 07/02/2011  . Hemorrhoid 07/02/2011  . Osteoporosis 07/02/2011  . Hypertension   . Hyperlipidemia   . IBS (irritable bowel syndrome)   . Diabetes mellitus   . Calcification of cartilage   . BPPV (benign paroxysmal positional vertigo)   . Vision loss of right eye     CBC    Component Value Date/Time   WBC 7.9 07/27/2013 0405   RBC 3.43* 07/27/2013 0405   HGB 9.5* 07/27/2013 0405   HCT 29.9* 07/27/2013 0405   PLT 201 07/27/2013 0405   MCV 87.2 07/27/2013 0405   LYMPHSABS 1.0 07/24/2013 1205   MONOABS 0.5 07/24/2013 1205   EOSABS 0.0 07/24/2013 1205   BASOSABS 0.0 07/24/2013 1205    CMP     Component Value Date/Time   NA 142 07/26/2013 0455   K 3.9 07/26/2013 0455   CL 104 07/26/2013 0455   CO2 26 07/26/2013 0455   GLUCOSE 117* 07/26/2013 0455   BUN 24* 07/26/2013 0455   CREATININE 1.09 07/26/2013 0455    CREATININE 1.09 02/13/2013 1314   CALCIUM 8.8 07/26/2013 0455   PROT 6.4 02/13/2013 1314   ALBUMIN 4.0 02/13/2013 1314   AST 17 02/13/2013 1314   ALT 10 02/13/2013 1314   ALKPHOS 61 02/13/2013 1314   BILITOT 1.0 02/13/2013 1314   GFRNONAA 45* 07/26/2013 0455   GFRAA 52* 07/26/2013 0455    Assessment and Plan  Hip fracture  Admitted for OT/PT; pain meds, robaxin and prophylaxed with Lovenox until 3/9 then asa 81MG  DAILY FOR 4 WEEKS.  Hypertension Controlled on no medications  Hyperlipidemia Continue lipitor  Postoperative anemia due to acute blood loss Dropped to 7.8 and was tx with 2 unit PRBC; will follow  UTI (urinary tract infection) E Coli , sensitive- treated with cipro for 10 days  Dementia without behavioral disturbance Continue aricept  Thyroid mass of unclear etiology Picked up on CT head/neck after a fall; TFT's normal;US showed several thyroid nodules, non large enough to meet criteria for biopsy    Hennie Duos, MD

## 2013-08-06 NOTE — Assessment & Plan Note (Signed)
Controlled on no medications. 

## 2013-08-06 NOTE — Assessment & Plan Note (Signed)
Picked up on CT head/neck after a fall; TFT's normal;US showed several thyroid nodules, non large enough to meet criteria for biopsy

## 2013-08-06 NOTE — Assessment & Plan Note (Addendum)
Dropped to 7.8 and was tx with 2 unit PRBC; will follow

## 2013-08-06 NOTE — Assessment & Plan Note (Signed)
Continue lipitor  ?

## 2013-08-06 NOTE — Assessment & Plan Note (Signed)
E Coli , sensitive- treated with cipro for 10 days

## 2013-08-06 NOTE — Assessment & Plan Note (Signed)
Continue aricept 

## 2013-08-11 DIAGNOSIS — S72009D Fracture of unspecified part of neck of unspecified femur, subsequent encounter for closed fracture with routine healing: Secondary | ICD-10-CM | POA: Diagnosis not present

## 2013-08-11 DIAGNOSIS — S72143A Displaced intertrochanteric fracture of unspecified femur, initial encounter for closed fracture: Secondary | ICD-10-CM | POA: Diagnosis not present

## 2013-09-03 ENCOUNTER — Non-Acute Institutional Stay (SKILLED_NURSING_FACILITY): Payer: Medicare Other | Admitting: Adult Health

## 2013-09-03 ENCOUNTER — Encounter: Payer: Self-pay | Admitting: Adult Health

## 2013-09-03 DIAGNOSIS — E785 Hyperlipidemia, unspecified: Secondary | ICD-10-CM | POA: Diagnosis not present

## 2013-09-03 DIAGNOSIS — D62 Acute posthemorrhagic anemia: Secondary | ICD-10-CM

## 2013-09-03 DIAGNOSIS — F0390 Unspecified dementia without behavioral disturbance: Secondary | ICD-10-CM | POA: Diagnosis not present

## 2013-09-03 DIAGNOSIS — E0789 Other specified disorders of thyroid: Secondary | ICD-10-CM

## 2013-09-03 DIAGNOSIS — S72009A Fracture of unspecified part of neck of unspecified femur, initial encounter for closed fracture: Secondary | ICD-10-CM | POA: Diagnosis not present

## 2013-09-03 DIAGNOSIS — D497 Neoplasm of unspecified behavior of endocrine glands and other parts of nervous system: Secondary | ICD-10-CM

## 2013-09-03 DIAGNOSIS — I10 Essential (primary) hypertension: Secondary | ICD-10-CM

## 2013-09-03 DIAGNOSIS — E119 Type 2 diabetes mellitus without complications: Secondary | ICD-10-CM

## 2013-09-03 NOTE — Progress Notes (Signed)
Patient ID: Briana Thornton, female   DOB: 1926-12-20, 80 y.o.   MRN: [id number]              PROGRESS NOTE  DATE: 09/03/2013   FACILITY: [facility]  LEVEL OF CARE: SNF (31)  Acute Visit  CHIEF COMPLAINT:  Discharge Notes  HISTORY OF PRESENT ILLNESS: This is an 80 year old female who is for discharge home with Home health PT, OT and Home health Aide. DME: Youth rolling walker. She had been admitted to [facility] on 07/27/13 from [facility]. She fell at home and sustained a right femur fracture and had IM nailing done. Patient was admitted to this facility for short-term rehabilitation after the patient's recent hospitalization.  Patient has completed SNF rehabilitation and therapy has cleared the patient for discharge.  Reassessment of ongoing problem(s):  HTN: Pt 's HTN remains stable.  Denies CP, sob, DOE, pedal edema, headaches, dizziness or visual disturbances.  No medications currently being used.  Last BP : 131/55  DEMENTIA: The dementia remaines stable and continues to function adequately in the current living environment with supervision.  The patient has had little changes in behavior. No complications noted from the medications presently being used.  ANEMIA: The anemia has been stable. The patient denies fatigue, melena or hematochezia.   3/15 hgb 9.5   PAST MEDICAL HISTORY : Reviewed.  No changes.  CURRENT MEDICATIONS: Reviewed per [facility]  REVIEW OF SYSTEMS:  GENERAL: no change in appetite, no fatigue, no weight changes, no fever, chills or weakness RESPIRATORY: no cough, SOB, DOE, wheezing, hemoptysis CARDIAC: no chest pain, edema or palpitations GI: no abdominal pain, diarrhea, constipation, heart burn, nausea or vomiting  PHYSICAL EXAMINATION  GENERAL: no acute distress, normal body habitus EYES: conjunctivae normal, sclerae normal, normal eye lids NECK: supple, trachea midline, no neck masses, no thyroid tenderness, no  thyromegaly LYMPHATICS: no LAN in the neck, no supraclavicular LAN RESPIRATORY: breathing is even & unlabored, BS CTAB CARDIAC: RRR, no murmur,no extra heart sounds, no edema GI: abdomen soft, normal BS, no masses, no tenderness, no hepatomegaly, no splenomegaly EXTREMITIES:  Ambulates with walker PSYCHIATRIC: the patient is alert & oriented to person, affect & behavior appropriate  LABS/RADIOLOGY: Labs reviewed: Basic Metabolic Panel:  Recent Labs  07/24/13 1205 07/25/13 0447 07/26/13 0455  NA 142 145 142  K 3.9 4.2 3.9  CL 104 109 104  CO2 27 26 26   GLUCOSE 132* 146* 117*  BUN 21 21 24*  CREATININE 0.97 1.10 1.09  CALCIUM 9.7 8.8 8.8   Liver Function Tests:  Recent Labs  02/13/13 1314  AST 17  ALT 10  ALKPHOS 61  BILITOT 1.0  PROT 6.4  ALBUMIN 4.0   CBC:  Recent Labs  02/13/13 1314 07/24/13 1205 07/25/13 0447 07/25/13 1236 07/26/13 0455 07/27/13 0405  WBC 6.0 10.3 8.8  --  7.8 7.9  NEUTROABS 3.9 8.7*  --   --   --   --   HGB 12.0 11.8* 7.9* 7.8* 9.9* 9.5*  HCT 36.3 36.5 24.2* 23.5* 29.7* 29.9*  MCV 84.6 88.6 89.3  --  85.8 87.2  PLT 260 252 220  --  190 201   CBG:  Recent Labs  07/26/13 1638 07/27/13 0714 07/27/13 1119  GLUCAP 169* 120* 164*    ASSESSMENT/PLAN:  Right femur fracture status post IM nailing - for home health PT, OT and home health aide Diabetes mellitus, type II - continue metformin Hypertension - well controlled without medication  Dementia - continue Aricept Hyperlipidemia - continue Lipitor Anemia, acute blood loss - check CBC Thyroid mass of unclear etiology - Picked up on CT head/neck after a fall; TFT's normal;US showed several thyroid nodules, non large enough to meet criteria for biopsy   I have filled out patient's discharge paperwork and written prescriptions.  Patient will receive home health PT, OT and CNA.  DME provided: Youth rolling walker  Total discharge time: Greater than 30 minutes Discharge time  involved coordination of the discharge process with Education officer, museum, nursing staff and therapy department. Medical justification for home health services/DME verified.  CPT CODE: 30865  Seth Bake - NP [facility] [phone]

## 2013-09-10 DIAGNOSIS — F0390 Unspecified dementia without behavioral disturbance: Secondary | ICD-10-CM | POA: Diagnosis not present

## 2013-09-10 DIAGNOSIS — M810 Age-related osteoporosis without current pathological fracture: Secondary | ICD-10-CM | POA: Diagnosis not present

## 2013-09-10 DIAGNOSIS — E119 Type 2 diabetes mellitus without complications: Secondary | ICD-10-CM | POA: Diagnosis not present

## 2013-09-10 DIAGNOSIS — Z5189 Encounter for other specified aftercare: Secondary | ICD-10-CM | POA: Diagnosis not present

## 2013-09-10 DIAGNOSIS — S72009D Fracture of unspecified part of neck of unspecified femur, subsequent encounter for closed fracture with routine healing: Secondary | ICD-10-CM | POA: Diagnosis not present

## 2013-09-10 DIAGNOSIS — M4000 Postural kyphosis, site unspecified: Secondary | ICD-10-CM | POA: Diagnosis not present

## 2013-09-11 DIAGNOSIS — S72009D Fracture of unspecified part of neck of unspecified femur, subsequent encounter for closed fracture with routine healing: Secondary | ICD-10-CM | POA: Diagnosis not present

## 2013-09-11 DIAGNOSIS — Z5189 Encounter for other specified aftercare: Secondary | ICD-10-CM | POA: Diagnosis not present

## 2013-09-11 DIAGNOSIS — M4000 Postural kyphosis, site unspecified: Secondary | ICD-10-CM | POA: Diagnosis not present

## 2013-09-11 DIAGNOSIS — M810 Age-related osteoporosis without current pathological fracture: Secondary | ICD-10-CM | POA: Diagnosis not present

## 2013-09-11 DIAGNOSIS — F0390 Unspecified dementia without behavioral disturbance: Secondary | ICD-10-CM | POA: Diagnosis not present

## 2013-09-11 DIAGNOSIS — E119 Type 2 diabetes mellitus without complications: Secondary | ICD-10-CM | POA: Diagnosis not present

## 2013-09-14 DIAGNOSIS — E119 Type 2 diabetes mellitus without complications: Secondary | ICD-10-CM | POA: Diagnosis not present

## 2013-09-14 DIAGNOSIS — F0390 Unspecified dementia without behavioral disturbance: Secondary | ICD-10-CM | POA: Diagnosis not present

## 2013-09-14 DIAGNOSIS — M810 Age-related osteoporosis without current pathological fracture: Secondary | ICD-10-CM | POA: Diagnosis not present

## 2013-09-14 DIAGNOSIS — M4000 Postural kyphosis, site unspecified: Secondary | ICD-10-CM | POA: Diagnosis not present

## 2013-09-14 DIAGNOSIS — Z5189 Encounter for other specified aftercare: Secondary | ICD-10-CM | POA: Diagnosis not present

## 2013-09-14 DIAGNOSIS — S72009D Fracture of unspecified part of neck of unspecified femur, subsequent encounter for closed fracture with routine healing: Secondary | ICD-10-CM | POA: Diagnosis not present

## 2013-09-15 DIAGNOSIS — E119 Type 2 diabetes mellitus without complications: Secondary | ICD-10-CM | POA: Diagnosis not present

## 2013-09-15 DIAGNOSIS — F0390 Unspecified dementia without behavioral disturbance: Secondary | ICD-10-CM | POA: Diagnosis not present

## 2013-09-15 DIAGNOSIS — S72143A Displaced intertrochanteric fracture of unspecified femur, initial encounter for closed fracture: Secondary | ICD-10-CM | POA: Diagnosis not present

## 2013-09-15 DIAGNOSIS — M4000 Postural kyphosis, site unspecified: Secondary | ICD-10-CM | POA: Diagnosis not present

## 2013-09-15 DIAGNOSIS — Z5189 Encounter for other specified aftercare: Secondary | ICD-10-CM | POA: Diagnosis not present

## 2013-09-15 DIAGNOSIS — M810 Age-related osteoporosis without current pathological fracture: Secondary | ICD-10-CM | POA: Diagnosis not present

## 2013-09-15 DIAGNOSIS — S72009D Fracture of unspecified part of neck of unspecified femur, subsequent encounter for closed fracture with routine healing: Secondary | ICD-10-CM | POA: Diagnosis not present

## 2013-09-16 DIAGNOSIS — Z5189 Encounter for other specified aftercare: Secondary | ICD-10-CM | POA: Diagnosis not present

## 2013-09-16 DIAGNOSIS — F0390 Unspecified dementia without behavioral disturbance: Secondary | ICD-10-CM | POA: Diagnosis not present

## 2013-09-16 DIAGNOSIS — M4000 Postural kyphosis, site unspecified: Secondary | ICD-10-CM | POA: Diagnosis not present

## 2013-09-16 DIAGNOSIS — E119 Type 2 diabetes mellitus without complications: Secondary | ICD-10-CM | POA: Diagnosis not present

## 2013-09-16 DIAGNOSIS — S72009D Fracture of unspecified part of neck of unspecified femur, subsequent encounter for closed fracture with routine healing: Secondary | ICD-10-CM | POA: Diagnosis not present

## 2013-09-16 DIAGNOSIS — M810 Age-related osteoporosis without current pathological fracture: Secondary | ICD-10-CM | POA: Diagnosis not present

## 2013-09-21 DIAGNOSIS — E119 Type 2 diabetes mellitus without complications: Secondary | ICD-10-CM | POA: Diagnosis not present

## 2013-09-21 DIAGNOSIS — M4000 Postural kyphosis, site unspecified: Secondary | ICD-10-CM | POA: Diagnosis not present

## 2013-09-21 DIAGNOSIS — Z5189 Encounter for other specified aftercare: Secondary | ICD-10-CM | POA: Diagnosis not present

## 2013-09-21 DIAGNOSIS — M810 Age-related osteoporosis without current pathological fracture: Secondary | ICD-10-CM | POA: Diagnosis not present

## 2013-09-21 DIAGNOSIS — F0390 Unspecified dementia without behavioral disturbance: Secondary | ICD-10-CM | POA: Diagnosis not present

## 2013-09-21 DIAGNOSIS — S72009D Fracture of unspecified part of neck of unspecified femur, subsequent encounter for closed fracture with routine healing: Secondary | ICD-10-CM | POA: Diagnosis not present

## 2013-09-23 DIAGNOSIS — F0390 Unspecified dementia without behavioral disturbance: Secondary | ICD-10-CM | POA: Diagnosis not present

## 2013-09-23 DIAGNOSIS — S72009D Fracture of unspecified part of neck of unspecified femur, subsequent encounter for closed fracture with routine healing: Secondary | ICD-10-CM | POA: Diagnosis not present

## 2013-09-23 DIAGNOSIS — Z5189 Encounter for other specified aftercare: Secondary | ICD-10-CM | POA: Diagnosis not present

## 2013-09-23 DIAGNOSIS — M810 Age-related osteoporosis without current pathological fracture: Secondary | ICD-10-CM | POA: Diagnosis not present

## 2013-09-23 DIAGNOSIS — E119 Type 2 diabetes mellitus without complications: Secondary | ICD-10-CM | POA: Diagnosis not present

## 2013-09-23 DIAGNOSIS — M4000 Postural kyphosis, site unspecified: Secondary | ICD-10-CM | POA: Diagnosis not present

## 2013-09-25 DIAGNOSIS — M810 Age-related osteoporosis without current pathological fracture: Secondary | ICD-10-CM | POA: Diagnosis not present

## 2013-09-25 DIAGNOSIS — M4000 Postural kyphosis, site unspecified: Secondary | ICD-10-CM | POA: Diagnosis not present

## 2013-09-25 DIAGNOSIS — Z5189 Encounter for other specified aftercare: Secondary | ICD-10-CM | POA: Diagnosis not present

## 2013-09-25 DIAGNOSIS — S72009D Fracture of unspecified part of neck of unspecified femur, subsequent encounter for closed fracture with routine healing: Secondary | ICD-10-CM | POA: Diagnosis not present

## 2013-09-25 DIAGNOSIS — F0390 Unspecified dementia without behavioral disturbance: Secondary | ICD-10-CM | POA: Diagnosis not present

## 2013-09-25 DIAGNOSIS — E119 Type 2 diabetes mellitus without complications: Secondary | ICD-10-CM | POA: Diagnosis not present

## 2013-09-28 DIAGNOSIS — F0390 Unspecified dementia without behavioral disturbance: Secondary | ICD-10-CM | POA: Diagnosis not present

## 2013-09-28 DIAGNOSIS — E119 Type 2 diabetes mellitus without complications: Secondary | ICD-10-CM | POA: Diagnosis not present

## 2013-09-28 DIAGNOSIS — Z5189 Encounter for other specified aftercare: Secondary | ICD-10-CM | POA: Diagnosis not present

## 2013-09-28 DIAGNOSIS — S72009D Fracture of unspecified part of neck of unspecified femur, subsequent encounter for closed fracture with routine healing: Secondary | ICD-10-CM | POA: Diagnosis not present

## 2013-09-28 DIAGNOSIS — M810 Age-related osteoporosis without current pathological fracture: Secondary | ICD-10-CM | POA: Diagnosis not present

## 2013-09-28 DIAGNOSIS — M4000 Postural kyphosis, site unspecified: Secondary | ICD-10-CM | POA: Diagnosis not present

## 2013-09-29 DIAGNOSIS — M810 Age-related osteoporosis without current pathological fracture: Secondary | ICD-10-CM | POA: Diagnosis not present

## 2013-09-29 DIAGNOSIS — Z5189 Encounter for other specified aftercare: Secondary | ICD-10-CM | POA: Diagnosis not present

## 2013-09-29 DIAGNOSIS — F0390 Unspecified dementia without behavioral disturbance: Secondary | ICD-10-CM | POA: Diagnosis not present

## 2013-09-29 DIAGNOSIS — M4000 Postural kyphosis, site unspecified: Secondary | ICD-10-CM | POA: Diagnosis not present

## 2013-09-29 DIAGNOSIS — S72009D Fracture of unspecified part of neck of unspecified femur, subsequent encounter for closed fracture with routine healing: Secondary | ICD-10-CM | POA: Diagnosis not present

## 2013-09-29 DIAGNOSIS — E119 Type 2 diabetes mellitus without complications: Secondary | ICD-10-CM | POA: Diagnosis not present

## 2013-10-05 DIAGNOSIS — Z5189 Encounter for other specified aftercare: Secondary | ICD-10-CM | POA: Diagnosis not present

## 2013-10-05 DIAGNOSIS — F0390 Unspecified dementia without behavioral disturbance: Secondary | ICD-10-CM | POA: Diagnosis not present

## 2013-10-05 DIAGNOSIS — S72009D Fracture of unspecified part of neck of unspecified femur, subsequent encounter for closed fracture with routine healing: Secondary | ICD-10-CM | POA: Diagnosis not present

## 2013-10-05 DIAGNOSIS — M4000 Postural kyphosis, site unspecified: Secondary | ICD-10-CM | POA: Diagnosis not present

## 2013-10-05 DIAGNOSIS — M810 Age-related osteoporosis without current pathological fracture: Secondary | ICD-10-CM | POA: Diagnosis not present

## 2013-10-05 DIAGNOSIS — E119 Type 2 diabetes mellitus without complications: Secondary | ICD-10-CM | POA: Diagnosis not present

## 2013-10-08 DIAGNOSIS — F0390 Unspecified dementia without behavioral disturbance: Secondary | ICD-10-CM | POA: Diagnosis not present

## 2013-10-08 DIAGNOSIS — E119 Type 2 diabetes mellitus without complications: Secondary | ICD-10-CM | POA: Diagnosis not present

## 2013-10-08 DIAGNOSIS — Z5189 Encounter for other specified aftercare: Secondary | ICD-10-CM | POA: Diagnosis not present

## 2013-10-08 DIAGNOSIS — S72009D Fracture of unspecified part of neck of unspecified femur, subsequent encounter for closed fracture with routine healing: Secondary | ICD-10-CM | POA: Diagnosis not present

## 2013-10-08 DIAGNOSIS — M4000 Postural kyphosis, site unspecified: Secondary | ICD-10-CM | POA: Diagnosis not present

## 2013-10-08 DIAGNOSIS — M810 Age-related osteoporosis without current pathological fracture: Secondary | ICD-10-CM | POA: Diagnosis not present

## 2013-10-13 DIAGNOSIS — S72009D Fracture of unspecified part of neck of unspecified femur, subsequent encounter for closed fracture with routine healing: Secondary | ICD-10-CM | POA: Diagnosis not present

## 2013-10-13 DIAGNOSIS — M4000 Postural kyphosis, site unspecified: Secondary | ICD-10-CM | POA: Diagnosis not present

## 2013-10-13 DIAGNOSIS — E119 Type 2 diabetes mellitus without complications: Secondary | ICD-10-CM | POA: Diagnosis not present

## 2013-10-13 DIAGNOSIS — M810 Age-related osteoporosis without current pathological fracture: Secondary | ICD-10-CM | POA: Diagnosis not present

## 2013-10-13 DIAGNOSIS — Z5189 Encounter for other specified aftercare: Secondary | ICD-10-CM | POA: Diagnosis not present

## 2013-10-13 DIAGNOSIS — F0390 Unspecified dementia without behavioral disturbance: Secondary | ICD-10-CM | POA: Diagnosis not present

## 2013-10-15 DIAGNOSIS — F0390 Unspecified dementia without behavioral disturbance: Secondary | ICD-10-CM | POA: Diagnosis not present

## 2013-10-15 DIAGNOSIS — E119 Type 2 diabetes mellitus without complications: Secondary | ICD-10-CM | POA: Diagnosis not present

## 2013-10-15 DIAGNOSIS — M4000 Postural kyphosis, site unspecified: Secondary | ICD-10-CM | POA: Diagnosis not present

## 2013-10-15 DIAGNOSIS — M810 Age-related osteoporosis without current pathological fracture: Secondary | ICD-10-CM | POA: Diagnosis not present

## 2013-10-15 DIAGNOSIS — S72009D Fracture of unspecified part of neck of unspecified femur, subsequent encounter for closed fracture with routine healing: Secondary | ICD-10-CM | POA: Diagnosis not present

## 2013-10-15 DIAGNOSIS — Z5189 Encounter for other specified aftercare: Secondary | ICD-10-CM | POA: Diagnosis not present

## 2013-10-20 DIAGNOSIS — S72009D Fracture of unspecified part of neck of unspecified femur, subsequent encounter for closed fracture with routine healing: Secondary | ICD-10-CM | POA: Diagnosis not present

## 2014-01-06 ENCOUNTER — Ambulatory Visit (INDEPENDENT_AMBULATORY_CARE_PROVIDER_SITE_OTHER): Payer: Medicare Other | Admitting: Physician Assistant

## 2014-01-06 ENCOUNTER — Encounter: Payer: Self-pay | Admitting: Physician Assistant

## 2014-01-06 ENCOUNTER — Ambulatory Visit (HOSPITAL_BASED_OUTPATIENT_CLINIC_OR_DEPARTMENT_OTHER)
Admission: RE | Admit: 2014-01-06 | Discharge: 2014-01-06 | Disposition: A | Discharge status: Home / Self Care | Payer: Medicare Other | Source: Ambulatory Visit | Attending: Physician Assistant | Admitting: Physician Assistant

## 2014-01-06 VITALS — BP 163/77 | HR 70 | Temp 97.9°F | Resp 14 | Ht 60.0 in | Wt 107.5 lb

## 2014-01-06 DIAGNOSIS — R1310 Dysphagia, unspecified: Secondary | ICD-10-CM | POA: Diagnosis not present

## 2014-01-06 DIAGNOSIS — R3989 Other symptoms and signs involving the genitourinary system: Secondary | ICD-10-CM

## 2014-01-06 DIAGNOSIS — M999 Biomechanical lesion, unspecified: Secondary | ICD-10-CM | POA: Diagnosis not present

## 2014-01-06 DIAGNOSIS — D497 Neoplasm of unspecified behavior of endocrine glands and other parts of nervous system: Secondary | ICD-10-CM

## 2014-01-06 DIAGNOSIS — E119 Type 2 diabetes mellitus without complications: Secondary | ICD-10-CM

## 2014-01-06 DIAGNOSIS — R8290 Unspecified abnormal findings in urine: Secondary | ICD-10-CM

## 2014-01-06 DIAGNOSIS — E042 Nontoxic multinodular goiter: Secondary | ICD-10-CM | POA: Diagnosis not present

## 2014-01-06 DIAGNOSIS — M4716 Other spondylosis with myelopathy, lumbar region: Secondary | ICD-10-CM | POA: Diagnosis not present

## 2014-01-06 DIAGNOSIS — R399 Unspecified symptoms and signs involving the genitourinary system: Secondary | ICD-10-CM

## 2014-01-06 DIAGNOSIS — M545 Low back pain, unspecified: Secondary | ICD-10-CM | POA: Diagnosis not present

## 2014-01-06 DIAGNOSIS — R82998 Other abnormal findings in urine: Secondary | ICD-10-CM

## 2014-01-06 DIAGNOSIS — E041 Nontoxic single thyroid nodule: Secondary | ICD-10-CM | POA: Diagnosis not present

## 2014-01-06 DIAGNOSIS — E785 Hyperlipidemia, unspecified: Secondary | ICD-10-CM

## 2014-01-06 DIAGNOSIS — M5137 Other intervertebral disc degeneration, lumbosacral region: Secondary | ICD-10-CM | POA: Diagnosis not present

## 2014-01-06 DIAGNOSIS — E0789 Other specified disorders of thyroid: Secondary | ICD-10-CM

## 2014-01-06 LAB — COMPLETE METABOLIC PANEL WITH GFR
ALBUMIN: 4 g/dL (ref 3.5–5.2)
ALT: <8 U/L (ref 0–35)
AST: 15 U/L (ref 0–37)
Alkaline Phosphatase: 54 U/L (ref 39–117)
BUN: 21 mg/dL (ref 6–23)
CALCIUM: 10.1 mg/dL (ref 8.4–10.5)
CHLORIDE: 106 meq/L (ref 96–112)
CO2: 29 meq/L (ref 19–32)
Creat: 1.07 mg/dL (ref 0.50–1.10)
GFR, EST AFRICAN AMERICAN: 55 mL/min — AB
GFR, Est Non African American: 47 mL/min — ABNORMAL LOW
GLUCOSE: 125 mg/dL — AB (ref 70–99)
POTASSIUM: 3.7 meq/L (ref 3.5–5.3)
Sodium: 143 mEq/L (ref 135–145)
Total Bilirubin: 1 mg/dL (ref 0.2–1.2)
Total Protein: 6.6 g/dL (ref 6.0–8.3)

## 2014-01-06 LAB — T4: T4, Total: 9.2 ug/dL (ref 5.0–12.5)

## 2014-01-06 LAB — POCT URINALYSIS DIPSTICK
Nitrite, UA: NEGATIVE
PH UA: 6
SPEC GRAV UA: 1.025
Urobilinogen, UA: 0.2

## 2014-01-06 LAB — CBC WITH DIFFERENTIAL/PLATELET
Basophils Absolute: 0.1 10*3/uL (ref 0.0–0.1)
Basophils Relative: 1 % (ref 0–1)
Eosinophils Absolute: 0.1 10*3/uL (ref 0.0–0.7)
Eosinophils Relative: 1 % (ref 0–5)
HEMATOCRIT: 36.8 % (ref 36.0–46.0)
HEMOGLOBIN: 11.9 g/dL — AB (ref 12.0–15.0)
LYMPHS PCT: 26 % (ref 12–46)
Lymphs Abs: 1.8 10*3/uL (ref 0.7–4.0)
MCH: 27.5 pg (ref 26.0–34.0)
MCHC: 32.3 g/dL (ref 30.0–36.0)
MCV: 85.2 fL (ref 78.0–100.0)
MONO ABS: 0.3 10*3/uL (ref 0.1–1.0)
Monocytes Relative: 4 % (ref 3–12)
NEUTROS ABS: 4.8 10*3/uL (ref 1.7–7.7)
Neutrophils Relative %: 68 % (ref 43–77)
Platelets: 258 10*3/uL (ref 150–400)
RBC: 4.32 MIL/uL (ref 3.87–5.11)
RDW: 15 % (ref 11.5–15.5)
WBC: 7 10*3/uL (ref 4.0–10.5)

## 2014-01-06 LAB — LIPID PANEL
CHOL/HDL RATIO: 1.7 ratio
CHOLESTEROL: 139 mg/dL (ref 0–200)
HDL: 83 mg/dL (ref >39)
LDL Cholesterol: 44 mg/dL (ref 0–99)
Triglycerides: 58 mg/dL (ref <150)
VLDL: 12 mg/dL (ref 0–40)

## 2014-01-06 LAB — TSH: TSH: 1.693 u[IU]/mL (ref 0.350–4.500)

## 2014-01-06 LAB — HEMOGLOBIN A1C
Hgb A1c MFr Bld: 5.8 % — ABNORMAL HIGH (ref <5.7)
Mean Plasma Glucose: 120 mg/dL — ABNORMAL HIGH (ref <117)

## 2014-01-06 MED ORDER — CEFUROXIME AXETIL 250 MG PO TABS: 250.0000 mg | ORAL_TABLET | Freq: Two times a day (BID) | ORAL | Status: DC

## 2014-01-06 NOTE — Patient Instructions (Signed)
Please obtain labs.  I will call you with your results.  Stop by the front desk to speak with [facility] about scheduling the ultrasound.  Keep her well hydrated.  Take Ceftin as directed. I will call you with her urine culture results.  We will base follow-up on lab and imaging results.  Urinary Tract Infection Urinary tract infections (UTIs) can develop anywhere along your urinary tract. Your urinary tract is your body's drainage system for removing wastes and extra water. Your urinary tract includes two kidneys, two ureters, a bladder, and a urethra. Your kidneys are a pair of bean-shaped organs. Each kidney is about the size of your fist. They are located below your ribs, one on each side of your spine. CAUSES Infections are caused by microbes, which are microscopic organisms, including fungi, viruses, and bacteria. These organisms are so small that they can only be seen through a microscope. Bacteria are the microbes that most commonly cause UTIs. SYMPTOMS  Symptoms of UTIs may vary by age and gender of the patient and by the location of the infection. Symptoms in young women typically include a frequent and intense urge to urinate and a painful, burning feeling in the bladder or urethra during urination. Older women and men are more likely to be tired, shaky, and weak and have muscle aches and abdominal pain. A fever may mean the infection is in your kidneys. Other symptoms of a kidney infection include pain in your back or sides below the ribs, nausea, and vomiting. DIAGNOSIS To diagnose a UTI, your caregiver will ask you about your symptoms. Your caregiver also will ask to provide a urine sample. The urine sample will be tested for bacteria and white blood cells. White blood cells are made by your body to help fight infection. TREATMENT  Typically, UTIs can be treated with medication. Because most UTIs are caused by a bacterial infection, they usually can be treated with the use of antibiotics. The  choice of antibiotic and length of treatment depend on your symptoms and the type of bacteria causing your infection. HOME CARE INSTRUCTIONS  If you were prescribed antibiotics, take them exactly as your caregiver instructs you. Finish the medication even if you feel better after you have only taken some of the medication.  Drink enough water and fluids to keep your urine clear or pale yellow.  Avoid caffeine, tea, and carbonated beverages. They tend to irritate your bladder.  Empty your bladder often. Avoid holding urine for long periods of time.  Empty your bladder before and after sexual intercourse.  After a bowel movement, women should cleanse from front to back. Use each tissue only once. SEEK MEDICAL CARE IF:   You have back pain.  You develop a fever.  Your symptoms do not begin to resolve within 3 days. SEEK IMMEDIATE MEDICAL CARE IF:   You have severe back pain or lower abdominal pain.  You develop chills.  You have nausea or vomiting.  You have continued burning or discomfort with urination. MAKE SURE YOU:   Understand these instructions.  Will watch your condition.  Will get help right away if you are not doing well or get worse. Document Released: 02/21/2005 Document Revised: 11/13/2011 Document Reviewed: 06/22/2011 [facility] Patient Information 2015 [employer], [place]. This information is not intended to replace advice given to you by your health care provider. Make sure you discuss any questions you have with your health care provider.

## 2014-01-06 NOTE — Progress Notes (Signed)
Pre visit review using our clinic review tool, if applicable. No additional management support is needed unless otherwise documented below in the visit note/SLS  

## 2014-01-07 LAB — CULTURE, URINE COMPREHENSIVE
Colony Count: NO GROWTH
Organism ID, Bacteria: NO GROWTH

## 2014-01-13 ENCOUNTER — Telehealth: Payer: Self-pay | Admitting: Physician Assistant

## 2014-01-13 DIAGNOSIS — E042 Nontoxic multinodular goiter: Secondary | ICD-10-CM

## 2014-01-14 NOTE — Telephone Encounter (Signed)
Daughter wishes to proceed with referral for Biopsy of thyroid nodules.  I have placed the order.  I cannot promise that they will perform this biopsy giving the patient's age and significant comorbidities (dementia, etc.), as the diagnosis from the biopsy may not be something a specialist would treat at her age.  We will then refer her to the appropriate specialist.

## 2014-01-14 NOTE — Telephone Encounter (Signed)
DUPLICATE NOTES: ALL INFORMATION NEEDS TO BE IN ONE NOTE/SLS [Copied from Result Note] Notes Recorded by De Burrs, CMA on 01/13/2014 at 2:34 PM Please see previous note. LDM Notes Recorded by De Burrs, CMA on 01/13/2014 at 2:34 PM See previous note Notes Recorded by De Burrs, CMA on 01/13/2014 at 2:33 PM Spoke with pts daughter, she's aware. She said everything is fine. If dr. Geraldine Solar to go with general surgeon she would prefer [facility]. LDM Notes Recorded by Brunetta Jeans, PA-C on 01/07/2014 at 8:49 PM Urine culture negative. No additional antibiotic needed. Elevated temperatures she has been having are less than 100.4 so are not technically considered fevers. Lab workup thus far unremarkable. Sometimes with chronic cirrhosis, temperature can become elevated transiently. I recommend if she is still feeling tired, etc, she needs to return to clinic for reevaluation by her PCP. Notes Recorded by Brunetta Jeans, PA-C on 01/07/2014 at 8:42 AM Labs are stable. No changes to medications at present. Urine culture pending. Thyroid US shows multiple nodules, including a new nodule from last study that meets the consensus for biopsy. We have to assess the risk vs benefit of biopsy giving her age and other comorbidities. I can set them up with a general surgeon or ENT for a consult to see whether or not they would recommend the biopsy.

## 2014-01-14 NOTE — Telephone Encounter (Signed)
Message copied by Raiford Noble on Thu Jan 14, 2014 10:21 AM ------      Message from: Di Kindle D      Created: Wed Jan 13, 2014  2:34 PM       Please see previous note. LDM ------

## 2014-01-18 ENCOUNTER — Telehealth: Payer: Self-pay

## 2014-01-18 NOTE — Telephone Encounter (Signed)
Pt's daughter stated that "pt saw Hassell Done 2 weeks ago and suggested a swallow eval. She would like it done at [facility] either Wednesday or Thursday since she is off. She has nodules on the back of her throat that she's suppose to have biopsy of at [facility] but they haven't heard anything back yet."LDM

## 2014-01-18 NOTE — Telephone Encounter (Signed)
Thyroid bx is sch for 9/1, there is no referral to [facility]

## 2014-01-18 NOTE — Telephone Encounter (Signed)
So it looks like Cody ordered a swallow eval with Speech therapy already, do I need to order anything else to check for the times they are requesting (see below). It also looks like she already had a thyroid biopsy ordered when is that supposed to happen and is that what they are referring to when they say the back of the throat?

## 2014-01-18 NOTE — Telephone Encounter (Signed)
The problem is I do not see any mention of throat biopsy. Do they mean the thyroid biopsy and do they know it is scheduled? Can you please check with them and clarify please

## 2014-01-19 NOTE — Telephone Encounter (Signed)
I spoke to patients daughter and she states that pat is going on 01-26-14 for the biopsy.   pts daughter would like a call back though about the swallow eval. pts daughter states she hasn't heard anything about this?  Anderson Malta please call them in the morning

## 2014-01-25 DIAGNOSIS — R399 Unspecified symptoms and signs involving the genitourinary system: Secondary | ICD-10-CM | POA: Insufficient documentation

## 2014-01-25 DIAGNOSIS — R8290 Unspecified abnormal findings in urine: Secondary | ICD-10-CM | POA: Insufficient documentation

## 2014-01-25 DIAGNOSIS — R1310 Dysphagia, unspecified: Secondary | ICD-10-CM | POA: Insufficient documentation

## 2014-01-25 NOTE — Assessment & Plan Note (Signed)
Will check TSH and T4 today.  Will obtain repeat US thyroid.

## 2014-01-25 NOTE — Assessment & Plan Note (Signed)
Will obtain labs at today's visit.  Continue current regimen for now.  Avoid excess carb intake.

## 2014-01-25 NOTE — Assessment & Plan Note (Signed)
UA with some abnormal findings.  Will send urine for culture.  Giving worsening confusion, will empirically treat with Ceftin.  Daily probiotic and cranberry supplement.  Good hydration recommended.

## 2014-01-25 NOTE — Assessment & Plan Note (Signed)
Will obtain labs today.

## 2014-01-25 NOTE — Progress Notes (Signed)
Patient presents to clinic today with her daughter for 38-month follow-up.  Patient with history of CAD, HTN, DM and Dementia.  Endorses taking medications as directed.  Is due for lab work.  Daughter wishes to discuss repeat Thyroid US as recent study revealed multiple thyroid nodules.  Patient has had some worsening of dementia symptoms over the past few months and her daughter is concerned thyroid may be contributing.  Mother is now starting to forget to eat.  Daughter has been adding Ensure to her diet to help with weight.  Patient has lost 12 pounds since visit in April this year. Is taking her Aricept as directed.  Daughter also concerned mother may have a UTI due to worsening confusion.  Patient denies urinary urgency, frequency, hematuria, nausea, vomiting, fever or chills.  Patient is, however, a questionable historian giving her dementia.  Past Medical History  Diagnosis Date  . Chicken pox as a child  . Measles as a child  . IBS (irritable bowel syndrome)   . Diabetes mellitus 47    type 2  . Hyperlipidemia 72  . Hypertension 72  . Vision loss of right eye   . Vertigo 2010    benign  . Calcification of cartilage     ear  . Cardiovascular system disease 07/02/2011  . Hemorrhoid 07/02/2011  . Osteoporosis 07/02/2011  . Weight loss 07/31/2011  . Anemia 05/22/2012  . Broken wrist 10-22-11    left  . Depression 05/22/2012  . Dermatitis 05/22/2012    Right neck    Current Outpatient Prescriptions on File Prior to Visit  Medication Sig Dispense Refill  . acetaminophen (TYLENOL) 325 MG tablet Take 2 tablets (650 mg total) by mouth every 6 (six) hours as needed for mild pain (or Fever >/= 101).  60 tablet  0  . alum & mag hydroxide-simeth (MAALOX/MYLANTA) 200-200-20 MG/5ML suspension Take 30 mLs by mouth every 6 (six) hours as needed for indigestion or heartburn (dyspepsia).  355 mL  0  . atorvastatin (LIPITOR) 10 MG tablet Take 1 tablet (10 mg total) by mouth daily.  90 tablet  3  .  bisacodyl (DULCOLAX) 10 MG suppository Place 1 suppository (10 mg total) rectally daily as needed for moderate constipation.  12 suppository  0  . donepezil (ARICEPT) 10 MG tablet Take 10 mg by mouth daily after breakfast.       . metFORMIN (GLUCOPHAGE-XR) 500 MG 24 hr tablet Take 1 tablet (500 mg total) by mouth daily with breakfast.  90 tablet  3  . methylcellulose (ARTIFICIAL TEARS) 1 % ophthalmic solution Place 1 drop into both eyes as needed.      . Multiple Vitamin (MULTIVITAMIN WITH MINERALS) TABS tablet Take 2 tablets by mouth daily.      . polyethylene glycol (MIRALAX / GLYCOLAX) packet Take 17 g by mouth daily as needed for mild constipation.  14 each  0  . Probiotic Product (PROBIOTIC DAILY PO) Take 1 tablet by mouth daily.       No current facility-administered medications on file prior to visit.    No Known Allergies  Family History  Problem Relation Age of Onset  . Diabetes Mother     type 2  . Hypertension Mother   . Cancer Brother     lung-smoker  . Diabetes Daughter     pre diabetic  . Diabetes Son     type 2  . Cancer Maternal Grandfather     prostate    History  Social History  . Marital Status: Widowed    Spouse Name: N/A    Number of Children: N/A  . Years of Education: N/A   Social History Main Topics  . Smoking status: Never Smoker   . Smokeless tobacco: Never Used  . Alcohol Use: No  . Drug Use: No  . Sexual Activity: No   Other Topics Concern  . None   Social History Narrative  . None   Review of Systems - See HPI.  All other ROS are negative.  BP 163/77  Pulse 70  Temp(Src) 97.9 F (36.6 C) (Oral)  Resp 14  Ht 5' (1.524 m)  Wt 107 lb 8 oz (48.762 kg)  BMI 20.99 kg/m2  SpO2 95%  Physical Exam  Vitals reviewed. Constitutional: She is well-developed, well-nourished, and in no distress.  HENT:  Head: Normocephalic and atraumatic.  Eyes: Conjunctivae are normal.  Neck: Neck supple. No thyromegaly present.  Cardiovascular:  Normal rate, regular rhythm, normal heart sounds and intact distal pulses.   Pulmonary/Chest: Effort normal and breath sounds normal. No respiratory distress. She has no wheezes. She has no rales. She exhibits no tenderness.  Abdominal:  Negative CVA tenderness.  Lymphadenopathy:    She has no cervical adenopathy.  Neurological: No cranial nerve deficit.  Alert to person but not place or time.  Skin: Skin is warm and dry. No rash noted.  Psychiatric: Affect normal.    Recent Results (from the past 2160 hour(s))  CULTURE, URINE COMPREHENSIVE     Status: None   Collection Time    01/06/14 10:29 AM      Result Value Ref Range   Colony Count NO GROWTH     Organism ID, Bacteria NO GROWTH    POCT URINALYSIS DIPSTICK     Status: Abnormal   Collection Time    01/06/14 10:30 AM      Result Value Ref Range   Color, UA Dark Gold     Clarity, UA cloudy     Glucose, UA nef     Bilirubin, UA small     Ketones, UA trace     Spec Grav, UA 1.025     Blood, UA Small, Hemolyzed     pH, UA 6.0     Protein, UA 30+     Urobilinogen, UA 0.2     Nitrite, UA NEG     Leukocytes, UA small (1+)    CBC WITH DIFFERENTIAL     Status: Abnormal   Collection Time    01/06/14 11:00 AM      Result Value Ref Range   WBC 7.0  4.0 - 10.5 K/uL   RBC 4.32  3.87 - 5.11 MIL/uL   Hemoglobin 11.9 (*) 12.0 - 15.0 g/dL   HCT 36.8  36.0 - 46.0 %   MCV 85.2  78.0 - 100.0 fL   MCH 27.5  26.0 - 34.0 pg   MCHC 32.3  30.0 - 36.0 g/dL   RDW 15.0  11.5 - 15.5 %   Platelets 258  150 - 400 K/uL   Neutrophils Relative % 68  43 - 77 %   Neutro Abs 4.8  1.7 - 7.7 K/uL   Lymphocytes Relative 26  12 - 46 %   Lymphs Abs 1.8  0.7 - 4.0 K/uL   Monocytes Relative 4  3 - 12 %   Monocytes Absolute 0.3  0.1 - 1.0 K/uL   Eosinophils Relative 1  0 - 5 %   Eosinophils  Absolute 0.1  0.0 - 0.7 K/uL   Basophils Relative 1  0 - 1 %   Basophils Absolute 0.1  0.0 - 0.1 K/uL   Smear Review Criteria for review not met    COMPLETE  METABOLIC PANEL WITH GFR     Status: Abnormal   Collection Time    01/06/14 11:00 AM      Result Value Ref Range   Sodium 143  135 - 145 mEq/L   Potassium 3.7  3.5 - 5.3 mEq/L   Chloride 106  96 - 112 mEq/L   CO2 29  19 - 32 mEq/L   Glucose, Bld 125 (*) 70 - 99 mg/dL   BUN 21  6 - 23 mg/dL   Creat 1.07  0.50 - 1.10 mg/dL   Total Bilirubin 1.0  0.2 - 1.2 mg/dL   Alkaline Phosphatase 54  39 - 117 U/L   AST 15  0 - 37 U/L   ALT <8  0 - 35 U/L   Total Protein 6.6  6.0 - 8.3 g/dL   Albumin 4.0  3.5 - 5.2 g/dL   Calcium 10.1  8.4 - 10.5 mg/dL   GFR, Est African American 55 (*)    GFR, Est Non African American 47 (*)    Comment:       The estimated GFR is a calculation valid for adults (>=65 years old)     that uses the CKD-EPI algorithm to adjust for age and sex. It is       not to be used for children, pregnant women, hospitalized patients,        patients on dialysis, or with rapidly changing kidney function.     According to the NKDEP, eGFR >89 is normal, 60-89 shows mild     impairment, 30-59 shows moderate impairment, 15-29 shows severe     impairment and <15 is ESRD.        TSH     Status: None   Collection Time    01/06/14 11:00 AM      Result Value Ref Range   TSH 1.693  0.350 - 4.500 uIU/mL  T4     Status: None   Collection Time    01/06/14 11:00 AM      Result Value Ref Range   T4, Total 9.2  5.0 - 12.5 ug/dL  HEMOGLOBIN A1C     Status: Abnormal   Collection Time    01/06/14 11:00 AM      Result Value Ref Range   Hemoglobin A1C 5.8 (*) <5.7 %   Comment:                                                                            According to the ADA Clinical Practice Recommendations for 2011, when     HbA1c is used as a screening test:             >=6.5%   Diagnostic of Diabetes Mellitus                (if abnormal result is confirmed)           5.7-6.4%   Increased risk of developing Diabetes Mellitus  References:Diagnosis and Classification of  Diabetes Mellitus,Diabetes     RFFM,3846,65(LDJTT 1):S62-S69 and Standards of Medical Care in             Diabetes - 2011,Diabetes SVXB,9390,30 (Suppl 1):S11-S61.         Mean Plasma Glucose 120 (*) <117 mg/dL  LIPID PANEL     Status: None   Collection Time    01/06/14 11:00 AM      Result Value Ref Range   Cholesterol 139  0 - 200 mg/dL   Comment: ATP III Classification:           < 200        mg/dL        Desirable          200 - 239     mg/dL        Borderline High          >= 240        mg/dL        High         Triglycerides 58  <150 mg/dL   HDL 83  >39 mg/dL   Total CHOL/HDL Ratio 1.7     VLDL 12  0 - 40 mg/dL   LDL Cholesterol 44  0 - 99 mg/dL   Comment:       Total Cholesterol/HDL Ratio:CHD Risk                            Coronary Heart Disease Risk Table                                            Men       Women              1/2 Average Risk              3.4        3.3                  Average Risk              5.0        4.4               2X Average Risk              9.6        7.1               3X Average Risk             23.4       11.0     Use the calculated Patient Ratio above and the CHD Risk table      to determine the patient's CHD Risk.     ATP III Classification (LDL):           < 100        mg/dL         Optimal          100 - 129     mg/dL         Near or Above Optimal          130 - 159     mg/dL         Borderline High  160 - 189     mg/dL         High           > 190        mg/dL         Very High         Assessment/Plan: Dysphagia, unspecified(787.20) May be contributing to weight loss.  Will order swallow study through [facility].   DM (diabetes mellitus) Will obtain labs at today's visit.  Continue current regimen for now.  Avoid excess carb intake.  UTI symptoms UA with some abnormal findings.  Will send urine for culture.  Giving worsening confusion, will empirically treat with Ceftin.  Daily probiotic and cranberry supplement.  Good  hydration recommended.  Hyperlipidemia Will obtain labs today.  Thyroid mass of unclear etiology Will check TSH and T4 today.  Will obtain repeat US thyroid.

## 2014-01-25 NOTE — Assessment & Plan Note (Signed)
May be contributing to weight loss.  Will order swallow study through [facility].

## 2014-01-26 ENCOUNTER — Other Ambulatory Visit (HOSPITAL_COMMUNITY)
Admission: RE | Admit: 2014-01-26 | Discharge: 2014-01-26 | Disposition: A | Discharge status: Home / Self Care | Payer: Medicare Other | Source: Ambulatory Visit | Attending: Diagnostic Radiology | Admitting: Diagnostic Radiology

## 2014-01-26 ENCOUNTER — Ambulatory Visit
Admission: RE | Admit: 2014-01-26 | Discharge: 2014-01-26 | Disposition: A | Discharge status: Home / Self Care | Payer: Medicare Other | Source: Ambulatory Visit | Attending: Physician Assistant | Admitting: Physician Assistant

## 2014-01-26 DIAGNOSIS — E042 Nontoxic multinodular goiter: Secondary | ICD-10-CM

## 2014-01-26 DIAGNOSIS — E041 Nontoxic single thyroid nodule: Secondary | ICD-10-CM | POA: Diagnosis present

## 2014-01-28 NOTE — Telephone Encounter (Signed)
Briana Thornton, Can you please check in the status of the Speech Therapy study and contact patient and daughter concerning this.

## 2014-01-28 NOTE — Telephone Encounter (Signed)
Spoke with Daughter, she will contact speech therapy

## 2014-02-08 ENCOUNTER — Other Ambulatory Visit: Payer: Self-pay | Admitting: Physician Assistant

## 2014-02-08 DIAGNOSIS — R1310 Dysphagia, unspecified: Secondary | ICD-10-CM

## 2014-02-10 ENCOUNTER — Other Ambulatory Visit (HOSPITAL_COMMUNITY): Payer: Self-pay | Admitting: Physician Assistant

## 2014-02-10 DIAGNOSIS — R1310 Dysphagia, unspecified: Secondary | ICD-10-CM

## 2014-02-16 ENCOUNTER — Ambulatory Visit (HOSPITAL_COMMUNITY)
Admission: RE | Admit: 2014-02-16 | Discharge: 2014-02-16 | Disposition: A | Discharge status: Home / Self Care | Payer: Medicare Other | Source: Ambulatory Visit | Attending: Physician Assistant | Admitting: Physician Assistant

## 2014-02-16 DIAGNOSIS — R1310 Dysphagia, unspecified: Secondary | ICD-10-CM

## 2014-02-16 NOTE — Procedures (Signed)
MBSS complete. Full report located under chart review in imaging section.  Briana Thornton, M.A. CCC-SLP [phone]  

## 2014-02-17 ENCOUNTER — Telehealth: Payer: Self-pay | Admitting: Physician Assistant

## 2014-02-17 DIAGNOSIS — R933 Abnormal findings on diagnostic imaging of other parts of digestive tract: Secondary | ICD-10-CM

## 2014-02-17 NOTE — Telephone Encounter (Signed)
Referral to [facility] placed based on MBS results.

## 2014-03-02 ENCOUNTER — Encounter: Payer: Self-pay | Admitting: *Deleted

## 2014-03-21 DIAGNOSIS — Z23 Encounter for immunization: Secondary | ICD-10-CM | POA: Diagnosis not present

## 2014-04-07 ENCOUNTER — Encounter: Payer: Self-pay | Admitting: *Deleted

## 2014-05-24 ENCOUNTER — Other Ambulatory Visit: Payer: Self-pay | Admitting: Family Medicine

## 2014-05-25 ENCOUNTER — Encounter: Payer: Self-pay | Admitting: *Deleted

## 2014-05-25 ENCOUNTER — Other Ambulatory Visit: Payer: Self-pay | Admitting: Family Medicine

## 2014-05-25 NOTE — Telephone Encounter (Signed)
RX sent to the pharmacy by e-script.//AB/CMA 

## 2014-05-27 ENCOUNTER — Other Ambulatory Visit: Payer: Self-pay | Admitting: Family Medicine

## 2014-05-27 NOTE — Telephone Encounter (Signed)
Med filled.  

## 2014-05-27 NOTE — Telephone Encounter (Signed)
Rx sent to the pharmacy by e-script.//AB/CMA 

## 2014-06-25 ENCOUNTER — Encounter: Payer: Self-pay | Admitting: *Deleted

## 2014-07-21 ENCOUNTER — Other Ambulatory Visit: Payer: Self-pay | Admitting: *Deleted

## 2014-07-21 MED ORDER — DONEPEZIL HCL 10 MG PO TABS: 10.0000 mg | ORAL_TABLET | Freq: Every day | ORAL | Status: DC

## 2014-07-21 NOTE — Telephone Encounter (Signed)
Rx sent to the pharmacy by e-script.  Pt needs to schedule an appt before next refill.//AB/CMA

## 2014-07-22 ENCOUNTER — Encounter: Payer: Self-pay | Admitting: *Deleted

## 2014-08-26 ENCOUNTER — Encounter: Payer: Self-pay | Admitting: *Deleted

## 2014-09-22 ENCOUNTER — Encounter: Payer: Self-pay | Admitting: *Deleted

## 2014-10-21 ENCOUNTER — Encounter: Payer: Self-pay | Admitting: Family Medicine

## 2014-10-21 ENCOUNTER — Ambulatory Visit (INDEPENDENT_AMBULATORY_CARE_PROVIDER_SITE_OTHER): Payer: Medicare Other | Admitting: Family Medicine

## 2014-10-21 VITALS — BP 142/70 | HR 76 | Temp 98.1°F | Ht <= 58 in | Wt 110.5 lb

## 2014-10-21 DIAGNOSIS — I10 Essential (primary) hypertension: Secondary | ICD-10-CM

## 2014-10-21 DIAGNOSIS — B351 Tinea unguium: Secondary | ICD-10-CM | POA: Diagnosis not present

## 2014-10-21 DIAGNOSIS — E785 Hyperlipidemia, unspecified: Secondary | ICD-10-CM

## 2014-10-21 DIAGNOSIS — K589 Irritable bowel syndrome without diarrhea: Secondary | ICD-10-CM

## 2014-10-21 DIAGNOSIS — R4189 Other symptoms and signs involving cognitive functions and awareness: Secondary | ICD-10-CM

## 2014-10-21 DIAGNOSIS — E119 Type 2 diabetes mellitus without complications: Secondary | ICD-10-CM

## 2014-10-21 LAB — COMPREHENSIVE METABOLIC PANEL
ALT: 10 U/L (ref 0–35)
AST: 17 U/L (ref 0–37)
Albumin: 3.9 g/dL (ref 3.5–5.2)
Alkaline Phosphatase: 61 U/L (ref 39–117)
BUN: 21 mg/dL (ref 6–23)
CHLORIDE: 108 meq/L (ref 96–112)
CO2: 29 meq/L (ref 19–32)
CREATININE: 1.05 mg/dL (ref 0.40–1.20)
Calcium: 9.6 mg/dL (ref 8.4–10.5)
GFR: 52.77 mL/min — AB (ref >60.00)
GLUCOSE: 114 mg/dL — AB (ref 70–99)
Potassium: 3.4 mEq/L — ABNORMAL LOW (ref 3.5–5.1)
SODIUM: 143 meq/L (ref 135–145)
Total Bilirubin: 0.9 mg/dL (ref 0.2–1.2)
Total Protein: 6.5 g/dL (ref 6.0–8.3)

## 2014-10-21 LAB — LIPID PANEL
CHOL/HDL RATIO: 2
Cholesterol: 141 mg/dL (ref 0–200)
HDL: 74.6 mg/dL (ref >39.00)
LDL CALC: 57 mg/dL (ref 0–99)
NonHDL: 66.4
Triglycerides: 46 mg/dL (ref 0.0–149.0)
VLDL: 9.2 mg/dL (ref 0.0–40.0)

## 2014-10-21 LAB — CBC
HEMATOCRIT: 36.8 % (ref 36.0–46.0)
Hemoglobin: 12 g/dL (ref 12.0–15.0)
MCHC: 32.6 g/dL (ref 30.0–36.0)
MCV: 87.8 fl (ref 78.0–100.0)
Platelets: 226 10*3/uL (ref 150.0–400.0)
RBC: 4.19 Mil/uL (ref 3.87–5.11)
RDW: 15 % (ref 11.5–15.5)
WBC: 7.3 10*3/uL (ref 4.0–10.5)

## 2014-10-21 LAB — TSH: TSH: 1.54 u[IU]/mL (ref 0.35–4.50)

## 2014-10-21 LAB — HEMOGLOBIN A1C: Hgb A1c MFr Bld: 5.5 % (ref 4.6–6.5)

## 2014-10-21 MED ORDER — METFORMIN HCL ER 500 MG PO TB24: ORAL_TABLET | ORAL | Status: DC

## 2014-10-21 MED ORDER — DONEPEZIL HCL 10 MG PO TABS: 10.0000 mg | ORAL_TABLET | Freq: Every day | ORAL | Status: DC

## 2014-10-21 MED ORDER — ATORVASTATIN CALCIUM 10 MG PO TABS: ORAL_TABLET | ORAL | Status: DC

## 2014-10-21 NOTE — Patient Instructions (Signed)
lamisil cream to feet daily, consider Lavendar oil   Ringworm, Nail A fungal infection of the nail (tinea unguium/onychomycosis) is common. It is common as the visible part of the nail is composed of dead cells which have no blood supply to help prevent infection. It occurs because fungi are everywhere and will pick any opportunity to grow on any dead material. Because nails are very slow growing they require up to 2 years of treatment with anti-fungal medications. The entire nail back to the base is infected. This includes approximately  of the nail which you cannot see. If your caregiver has prescribed a medication by mouth, take it every day and as directed. No progress will be seen for at least 6 to 9 months. Do not be disappointed! Because fungi live on dead cells with little or no exposure to blood supply, medication delivery to the infection is slow; thus the cure is slow. It is also why you can observe no progress in the first 6 months. The nail becoming cured is the base of the nail, as it has the blood supply. Topical medication such as creams and ointments are usually not effective. Important in successful treatment of nail fungus is closely following the medication regimen that your doctor prescribes. Sometimes you and your caregiver may elect to speed up this process by surgical removal of all the nails. Even this may still require 6 to 9 months of additional oral medications. See your caregiver as directed. Remember there will be no visible improvement for at least 6 months. See your caregiver sooner if other signs of infection (redness and swelling) develop. Document Released: 05/11/2000 Document Revised: 08/06/2011 Document Reviewed: 07/20/2008 [facility] Patient Information 2015 [place], [place]. This information is not intended to replace advice given to you by your health care provider. Make sure you discuss any questions you have with your health care provider.

## 2014-10-21 NOTE — Progress Notes (Signed)
Pre visit review using our clinic review tool, if applicable. No additional management support is needed unless otherwise documented below in the visit note. 

## 2014-10-27 ENCOUNTER — Encounter: Payer: Medicare Other | Admitting: Internal Medicine

## 2014-10-31 DIAGNOSIS — B351 Tinea unguium: Secondary | ICD-10-CM | POA: Insufficient documentation

## 2014-10-31 NOTE — Assessment & Plan Note (Signed)
Referred to podiatry for care. Consider Lamisil topically to toenails and feet

## 2014-10-31 NOTE — Assessment & Plan Note (Signed)
Avoid offending foods, start probiotics. Do not eat large meals in late evening and consider raising head of bed.  

## 2014-10-31 NOTE — Progress Notes (Signed)
Briana Thornton  [id number] [birth year] 10/31/2014      Progress Note-Follow Up  Subjective  Chief Complaint  Chief Complaint  Patient presents with  . Follow-up    HPI  Patient is a 81 y.o. female in today for routine medical care. Patient is in today with family member. Overall she is doing well. She does continue to show slow steady memory decline but they do feel the Aricept has slowed this. She has struggled with some worsening of unsteady gait and weakness and is less and less physically active TIME. No recent falls or injury. No other acute complaints. Is having trouble with self-care regarding her feet. Her toenails have gotten so thick that her family is having trouble keeping her toenails trimed. Denies CP/palp/SOB/HA/congestion/fevers/GI or GU c/o. Taking meds as prescribed  Past Medical History  Diagnosis Date  . Chicken pox as a child  . Measles as a child  . IBS (irritable bowel syndrome)   . Diabetes mellitus 47    type 2  . Hyperlipidemia 72  . Hypertension 72  . Vision loss of right eye   . Vertigo 2010    benign  . Calcification of cartilage     ear  . Cardiovascular system disease 07/02/2011  . Hemorrhoid 07/02/2011  . Osteoporosis 07/02/2011  . Weight loss 07/31/2011  . Anemia 05/22/2012  . Broken wrist 10-22-11    left  . Depression 05/22/2012  . Dermatitis 05/22/2012    Right neck    Past Surgical History  Procedure Laterality Date  . Hemorroidectomy  1979  . Knee scoped  2002    right  . Rotator cuff repair  2004    right  . Abdominal hysterectomy  2008    partial still has ovaries  . Lens implant left  2006  . Pacemaker insertion  2010  . Wrist surgery  11-05-11    left wrist  . Intramedullary (im) nail intertrochanteric Right 07/24/2013    Procedure: INTRAMEDULLARY (IM) NAIL INTERTROCHANTRIC;  Surgeon: Gearlean Alf, MD;  Location: [facility];  Service: Orthopedics;  Laterality: Right;    Family History  Problem Relation Age of Onset  .  Diabetes Mother     type 2  . Hypertension Mother   . Cancer Brother     lung-smoker  . Diabetes Daughter     pre diabetic  . Diabetes Son     type 2  . Cancer Maternal Grandfather     prostate    History   Social History  . Marital Status: Widowed    Spouse Name: N/A  . Number of Children: N/A  . Years of Education: N/A   Occupational History  . Not on file.   Social History Main Topics  . Smoking status: Never Smoker   . Smokeless tobacco: Never Used  . Alcohol Use: No  . Drug Use: No  . Sexual Activity: No   Other Topics Concern  . Not on file   Social History Narrative    Current Outpatient Prescriptions on File Prior to Visit  Medication Sig Dispense Refill  . acetaminophen (TYLENOL) 325 MG tablet Take 2 tablets (650 mg total) by mouth every 6 (six) hours as needed for mild pain (or Fever >/= 101). 60 tablet 0  . alum & mag hydroxide-simeth (MAALOX/MYLANTA) 200-200-20 MG/5ML suspension Take 30 mLs by mouth every 6 (six) hours as needed for indigestion or heartburn (dyspepsia). 355 mL 0  . bisacodyl (DULCOLAX) 10 MG suppository Place 1 suppository (  10 mg total) rectally daily as needed for moderate constipation. 12 suppository 0  . Docusate Sodium (DSS) 100 MG CAPS Take 100 mg by mouth 2 (two) times daily as needed.    . methylcellulose (ARTIFICIAL TEARS) 1 % ophthalmic solution Place 1 drop into both eyes as needed.    . Multiple Vitamin (MULTIVITAMIN WITH MINERALS) TABS tablet Take 2 tablets by mouth daily.    . polyethylene glycol (MIRALAX / GLYCOLAX) packet Take 17 g by mouth daily as needed for mild constipation. 14 each 0  . Probiotic Product (PROBIOTIC DAILY PO) Take 1 tablet by mouth daily.     No current facility-administered medications on file prior to visit.    No Known Allergies  Review of Systems  Review of Systems  Constitutional: Negative for fever and malaise/fatigue.  HENT: Negative for congestion.   Eyes: Negative for discharge.    Respiratory: Negative for shortness of breath.   Cardiovascular: Negative for chest pain, palpitations and leg swelling.  Gastrointestinal: Negative for nausea, abdominal pain and diarrhea.  Genitourinary: Negative for dysuria.  Musculoskeletal: Negative for falls.  Skin: Negative for rash.  Neurological: Positive for weakness. Negative for loss of consciousness and headaches.  Endo/Heme/Allergies: Negative for polydipsia.  Psychiatric/Behavioral: Positive for suicidal ideas and memory loss. Negative for depression. The patient is not nervous/anxious and does not have insomnia.     Objective  BP 142/70 mmHg  Pulse 76  Temp(Src) 98.1 F (36.7 C) (Oral)  Ht 4\' 10"  (1.473 m)  Wt 110 lb 8 oz (50.122 kg)  BMI 23.10 kg/m2  SpO2 95%  Physical Exam  Physical Exam  Constitutional: She is oriented to person, place, and time and well-developed, well-nourished, and in no distress. No distress.  HENT:  Head: Normocephalic and atraumatic.  Eyes: Conjunctivae are normal.  Neck: Neck supple. No thyromegaly present.  Cardiovascular: Normal rate, regular rhythm and normal heart sounds.   Pulmonary/Chest: Effort normal and breath sounds normal. She has no wheezes.  Abdominal: She exhibits no distension and no mass.  Musculoskeletal: She exhibits no edema.  Lymphadenopathy:    She has no cervical adenopathy.  Neurological: She is alert and oriented to person, place, and time.  Skin: Skin is warm and dry. No rash noted. She is not diaphoretic.  Psychiatric: Memory, affect and judgment normal.    Lab Results  Component Value Date   TSH 1.54 10/21/2014   Lab Results  Component Value Date   WBC 7.3 10/21/2014   HGB 12.0 10/21/2014   HCT 36.8 10/21/2014   MCV 87.8 10/21/2014   PLT 226.0 10/21/2014   Lab Results  Component Value Date   CREATININE 1.05 10/21/2014   BUN 21 10/21/2014   NA 143 10/21/2014   K 3.4* 10/21/2014   CL 108 10/21/2014   CO2 29 10/21/2014   Lab Results   Component Value Date   ALT 10 10/21/2014   AST 17 10/21/2014   ALKPHOS 61 10/21/2014   BILITOT 0.9 10/21/2014   Lab Results  Component Value Date   CHOL 141 10/21/2014   Lab Results  Component Value Date   HDL 74.60 10/21/2014   Lab Results  Component Value Date   LDLCALC 57 10/21/2014   Lab Results  Component Value Date   TRIG 46.0 10/21/2014   Lab Results  Component Value Date   CHOLHDL 2 10/21/2014     Assessment & Plan  Hypertension Well controlled, no changes to meds. Encouraged heart healthy diet such as the DASH  diet and exercise as tolerated.    IBS (irritable bowel syndrome) Avoid offending foods, start probiotics. Do not eat large meals in late evening and consider raising head of bed.    DM (diabetes mellitus) hgba1c acceptable, minimize simple carbs. Increase exercise as tolerated. Continue current meds   Hyperlipidemia Tolerating statin, encouraged heart healthy diet, avoid trans fats, minimize simple carbs and saturated fats. Increase exercise as tolerated   Cognitive decline They feel aricept has helped some, family involved daily, . Can consider referral to neurology if they would like further work up. It is up to them.   Onychomycosis Referred to podiatry for care. Consider Lamisil topically to toenails and feet

## 2014-10-31 NOTE — Assessment & Plan Note (Signed)
hgba1c acceptable, minimize simple carbs. Increase exercise as tolerated. Continue current meds 

## 2014-10-31 NOTE — Assessment & Plan Note (Signed)
They feel aricept has helped some, family involved daily, . Can consider referral to neurology if they would like further work up. It is up to them.

## 2014-10-31 NOTE — Assessment & Plan Note (Signed)
Tolerating statin, encouraged heart healthy diet, avoid trans fats, minimize simple carbs and saturated fats. Increase exercise as tolerated 

## 2014-10-31 NOTE — Assessment & Plan Note (Signed)
Well controlled, no changes to meds. Encouraged heart healthy diet such as the DASH diet and exercise as tolerated.  °

## 2014-11-12 ENCOUNTER — Encounter: Payer: Medicare Other | Admitting: Internal Medicine

## 2014-12-13 DIAGNOSIS — M79674 Pain in right toe(s): Secondary | ICD-10-CM | POA: Diagnosis not present

## 2014-12-15 ENCOUNTER — Ambulatory Visit (INDEPENDENT_AMBULATORY_CARE_PROVIDER_SITE_OTHER): Payer: Medicare Other | Admitting: Internal Medicine

## 2014-12-15 ENCOUNTER — Encounter: Payer: Self-pay | Admitting: Internal Medicine

## 2014-12-15 VITALS — BP 130/78 | HR 67 | Ht <= 58 in | Wt 105.6 lb

## 2014-12-15 DIAGNOSIS — Z950 Presence of cardiac pacemaker: Secondary | ICD-10-CM | POA: Diagnosis not present

## 2014-12-15 DIAGNOSIS — I495 Sick sinus syndrome: Secondary | ICD-10-CM

## 2014-12-15 DIAGNOSIS — I10 Essential (primary) hypertension: Secondary | ICD-10-CM | POA: Diagnosis not present

## 2014-12-15 LAB — CUP PACEART INCLINIC DEVICE CHECK
Battery Remaining Longevity: 72 mo
Lead Channel Impedance Value: 375 Ohm
Lead Channel Impedance Value: 637.5 Ohm
Lead Channel Pacing Threshold Amplitude: 0.5 V
Lead Channel Pacing Threshold Pulse Width: 0.4 ms
Lead Channel Sensing Intrinsic Amplitude: 1.3 mV
Lead Channel Setting Pacing Amplitude: 2 V
Lead Channel Setting Pacing Pulse Width: 0.4 ms
Lead Channel Setting Sensing Sensitivity: 2 mV
MDC IDC MSMT BATTERY VOLTAGE: 2.9 V
MDC IDC MSMT LEADCHNL RA PACING THRESHOLD PULSEWIDTH: 0.4 ms
MDC IDC MSMT LEADCHNL RV PACING THRESHOLD AMPLITUDE: 0.75 V
MDC IDC PG SERIAL: 2315100
MDC IDC SESS DTM: 20160720085918
MDC IDC SET LEADCHNL RV PACING AMPLITUDE: 2.5 V
MDC IDC STAT BRADY RA PERCENT PACED: 64 %
MDC IDC STAT BRADY RV PERCENT PACED: 99.76 %

## 2014-12-15 NOTE — Progress Notes (Signed)
HPI [name] returns today for followup. She is a very pleasant elderly woman with a history of complete heart block, status post permanent pacemaker insertion, diabetes, and dyslipidemia. In the interim, she has had a hip fracture. No problems with rehab. She denies chest pain or shortness of breath. No syncope.  No Known Allergies   Current Outpatient Prescriptions  Medication Sig Dispense Refill  . acetaminophen (TYLENOL) 325 MG tablet Take 2 tablets (650 mg total) by mouth every 6 (six) hours as needed for mild pain (or Fever >/= 101). 60 tablet 0  . alum & mag hydroxide-simeth (MAALOX/MYLANTA) 200-200-20 MG/5ML suspension Take 30 mLs by mouth every 6 (six) hours as needed for indigestion or heartburn (dyspepsia). 355 mL 0  . atorvastatin (LIPITOR) 10 MG tablet TAKE 1 TABLET (10 MG TOTAL) BY MOUTH DAILY. 90 tablet 3  . bisacodyl (DULCOLAX) 10 MG suppository Place 1 suppository (10 mg total) rectally daily as needed for moderate constipation. 12 suppository 0  . Docusate Sodium (DSS) 100 MG CAPS Take 100 mg by mouth 2 (two) times daily as needed (constipation).     Marland Kitchen donepezil (ARICEPT) 10 MG tablet Take 1 tablet (10 mg total) by mouth at bedtime. 90 tablet 3  . metFORMIN (GLUCOPHAGE-XR) 500 MG 24 hr tablet TAKE 1 TABLET (500 MG TOTAL) BY MOUTH DAILY WITH BREAKFAST. 90 tablet 3  . methylcellulose (ARTIFICIAL TEARS) 1 % ophthalmic solution Place 1 drop into both eyes as needed (dry eyes).     . Multiple Vitamin (MULTIVITAMIN WITH MINERALS) TABS tablet Take 2 tablets by mouth daily.    . polyethylene glycol (MIRALAX / GLYCOLAX) packet Take 17 g by mouth daily as needed for mild constipation. 14 each 0  . Probiotic Product (PROBIOTIC DAILY PO) Take 1 tablet by mouth daily.     No current facility-administered medications for this visit.     Past Medical History  Diagnosis Date  . Chicken pox as a child  . Measles as a child  . IBS (irritable bowel syndrome)   . Diabetes mellitus 47   type 2  . Hyperlipidemia 72  . Hypertension 72  . Vision loss of right eye   . Vertigo 2010    benign  . Calcification of cartilage     ear  . Cardiovascular system disease 07/02/2011  . Hemorrhoid 07/02/2011  . Osteoporosis 07/02/2011  . Weight loss 07/31/2011  . Anemia 05/22/2012  . Broken wrist 10-22-11    left  . Depression 05/22/2012  . Dermatitis 05/22/2012    Right neck    ROS:   All systems reviewed and negative except as noted in the HPI.   Past Surgical History  Procedure Laterality Date  . Hemorroidectomy  1979  . Knee scoped  2002    right  . Rotator cuff repair  2004    right  . Abdominal hysterectomy  2008    partial still has ovaries  . Lens implant left  2006  . Pacemaker insertion  2010  . Wrist surgery  11-05-11    left wrist  . Intramedullary (im) nail intertrochanteric Right 07/24/2013    Procedure: INTRAMEDULLARY (IM) NAIL INTERTROCHANTRIC;  Surgeon: Gearlean Alf, MD;  Location: [facility];  Service: Orthopedics;  Laterality: Right;     Family History  Problem Relation Age of Onset  . Diabetes Mother     type 2  . Hypertension Mother   . Cancer Brother     lung-smoker  . Diabetes Daughter  pre diabetic  . Diabetes Son     type 2  . Cancer Maternal Grandfather     prostate     History   Social History  . Marital Status: Widowed    Spouse Name: N/A  . Number of Children: N/A  . Years of Education: N/A   Occupational History  . Not on file.   Social History Main Topics  . Smoking status: Never Smoker   . Smokeless tobacco: Never Used  . Alcohol Use: No  . Drug Use: No  . Sexual Activity: No   Other Topics Concern  . Not on file   Social History Narrative     BP 130/78 mmHg  Pulse 67  Ht 4\' 10"  (1.473 m)  Wt 105 lb 9.6 oz (47.9 kg)  BMI 22.08 kg/m2  Physical Exam:  Well appearing Elderly woman,NAD HEENT: Unremarkable Neck:  No JVD, no thyromegally Back:  No CVA tenderness Lungs:  Clear with no wheezes, rales, or  rhonchi. HEART:  Regular rate rhythm, no murmurs, no rubs, no clicks Abd:  soft, positive bowel sounds, no organomegally, no rebound, no guarding Ext:  2 plus pulses, no edema, no cyanosis, no clubbing Skin:  No rashes no nodules Neuro:  CN II through XII intact, motor grossly intact   DEVICE  Normal device function.  See PaceArt for details.   Assess/Plan:

## 2014-12-15 NOTE — Assessment & Plan Note (Signed)
Her [employer] DDD PM is working normally. Will recheck in several months.  

## 2014-12-15 NOTE — Assessment & Plan Note (Signed)
Her blood pressure is stable. No change in meds. She has had some orthostasis, so I would not feel compelled to aggressively try to lower her systolic pressure.

## 2014-12-15 NOTE — Patient Instructions (Signed)
Medication Instructions:   Your physician recommends that you continue on your current medications as directed. Please refer to the Current Medication list given to you today.    Labwork:   Testing/Procedures:   Follow-Up:  Remote monitoring is used to monitor your Pacemaker of ICD from home. This monitoring reduces the number of office visits required to check your device to one time per year. It allows [place] to keep an eye on the functioning of your device to ensure it is working properly. You are scheduled for a device check from home on 03/16/15 You may send your transmission at any time that day. If you have a wireless device, the transmission will be sent automatically. After your physician reviews your transmission, you will receive a postcard with your next transmission date.  Your physician wants you to follow-up in:  [place] will receive a reminder letter in the mail two months in advance. If you don't receive a letter, please call our office to schedule the follow-up appointment.' .    Any Other Special Instructions Will Be Listed Below (If Applicable).

## 2014-12-21 ENCOUNTER — Encounter: Payer: Medicare Other | Admitting: Internal Medicine

## 2015-01-06 ENCOUNTER — Telehealth: Payer: Self-pay

## 2015-01-06 NOTE — Telephone Encounter (Signed)
Called to scheduled appointment with [facility].  Left a message for call back.

## 2015-01-06 NOTE — Telephone Encounter (Signed)
Appointment scheduled.

## 2015-01-06 NOTE — Telephone Encounter (Signed)
Reason for call: Pt daughter, Butler Denmark, returning your call [phone].

## 2015-02-27 DIAGNOSIS — Z23 Encounter for immunization: Secondary | ICD-10-CM | POA: Diagnosis not present

## 2015-03-05 ENCOUNTER — Emergency Department (HOSPITAL_COMMUNITY): Payer: Medicare Other

## 2015-03-05 ENCOUNTER — Inpatient Hospital Stay (HOSPITAL_COMMUNITY)
Admission: EM | Admit: 2015-03-05 | Discharge: 2015-03-08 | DRG: 194 | Disposition: A | Discharge status: Skilled Nursing Facility | Payer: Medicare Other | Attending: Internal Medicine | Admitting: Internal Medicine

## 2015-03-05 ENCOUNTER — Encounter (HOSPITAL_COMMUNITY): Payer: Self-pay | Admitting: Emergency Medicine

## 2015-03-05 DIAGNOSIS — E860 Dehydration: Secondary | ICD-10-CM

## 2015-03-05 DIAGNOSIS — Z79899 Other long term (current) drug therapy: Secondary | ICD-10-CM | POA: Diagnosis not present

## 2015-03-05 DIAGNOSIS — Z833 Family history of diabetes mellitus: Secondary | ICD-10-CM | POA: Diagnosis not present

## 2015-03-05 DIAGNOSIS — Z5189 Encounter for other specified aftercare: Secondary | ICD-10-CM | POA: Diagnosis not present

## 2015-03-05 DIAGNOSIS — I10 Essential (primary) hypertension: Secondary | ICD-10-CM | POA: Diagnosis present

## 2015-03-05 DIAGNOSIS — J189 Pneumonia, unspecified organism: Principal | ICD-10-CM | POA: Diagnosis present

## 2015-03-05 DIAGNOSIS — R05 Cough: Secondary | ICD-10-CM | POA: Diagnosis not present

## 2015-03-05 DIAGNOSIS — Z801 Family history of malignant neoplasm of trachea, bronchus and lung: Secondary | ICD-10-CM | POA: Diagnosis not present

## 2015-03-05 DIAGNOSIS — Z950 Presence of cardiac pacemaker: Secondary | ICD-10-CM | POA: Diagnosis not present

## 2015-03-05 DIAGNOSIS — Z66 Do not resuscitate: Secondary | ICD-10-CM | POA: Diagnosis present

## 2015-03-05 DIAGNOSIS — R64 Cachexia: Secondary | ICD-10-CM | POA: Diagnosis present

## 2015-03-05 DIAGNOSIS — E119 Type 2 diabetes mellitus without complications: Secondary | ICD-10-CM | POA: Diagnosis not present

## 2015-03-05 DIAGNOSIS — R32 Unspecified urinary incontinence: Secondary | ICD-10-CM | POA: Diagnosis present

## 2015-03-05 DIAGNOSIS — M810 Age-related osteoporosis without current pathological fracture: Secondary | ICD-10-CM | POA: Diagnosis present

## 2015-03-05 DIAGNOSIS — H5461 Unqualified visual loss, right eye, normal vision left eye: Secondary | ICD-10-CM | POA: Diagnosis present

## 2015-03-05 DIAGNOSIS — E1121 Type 2 diabetes mellitus with diabetic nephropathy: Secondary | ICD-10-CM | POA: Diagnosis not present

## 2015-03-05 DIAGNOSIS — R278 Other lack of coordination: Secondary | ICD-10-CM | POA: Diagnosis not present

## 2015-03-05 DIAGNOSIS — R0682 Tachypnea, not elsewhere classified: Secondary | ICD-10-CM | POA: Diagnosis present

## 2015-03-05 DIAGNOSIS — Z8249 Family history of ischemic heart disease and other diseases of the circulatory system: Secondary | ICD-10-CM | POA: Diagnosis not present

## 2015-03-05 DIAGNOSIS — R1310 Dysphagia, unspecified: Secondary | ICD-10-CM | POA: Diagnosis not present

## 2015-03-05 DIAGNOSIS — Z7984 Long term (current) use of oral hypoglycemic drugs: Secondary | ICD-10-CM

## 2015-03-05 DIAGNOSIS — Z90710 Acquired absence of both cervix and uterus: Secondary | ICD-10-CM | POA: Diagnosis not present

## 2015-03-05 DIAGNOSIS — R531 Weakness: Secondary | ICD-10-CM | POA: Diagnosis not present

## 2015-03-05 DIAGNOSIS — R0602 Shortness of breath: Secondary | ICD-10-CM | POA: Diagnosis not present

## 2015-03-05 DIAGNOSIS — F329 Major depressive disorder, single episode, unspecified: Secondary | ICD-10-CM | POA: Diagnosis present

## 2015-03-05 DIAGNOSIS — K589 Irritable bowel syndrome without diarrhea: Secondary | ICD-10-CM | POA: Diagnosis present

## 2015-03-05 DIAGNOSIS — M6281 Muscle weakness (generalized): Secondary | ICD-10-CM | POA: Diagnosis not present

## 2015-03-05 DIAGNOSIS — E079 Disorder of thyroid, unspecified: Secondary | ICD-10-CM | POA: Diagnosis not present

## 2015-03-05 DIAGNOSIS — F0390 Unspecified dementia without behavioral disturbance: Secondary | ICD-10-CM | POA: Diagnosis present

## 2015-03-05 DIAGNOSIS — E785 Hyperlipidemia, unspecified: Secondary | ICD-10-CM | POA: Diagnosis present

## 2015-03-05 DIAGNOSIS — R262 Difficulty in walking, not elsewhere classified: Secondary | ICD-10-CM | POA: Diagnosis not present

## 2015-03-05 DIAGNOSIS — R0902 Hypoxemia: Secondary | ICD-10-CM | POA: Diagnosis present

## 2015-03-05 DIAGNOSIS — R1313 Dysphagia, pharyngeal phase: Secondary | ICD-10-CM | POA: Diagnosis not present

## 2015-03-05 DIAGNOSIS — F0391 Unspecified dementia with behavioral disturbance: Secondary | ICD-10-CM | POA: Diagnosis not present

## 2015-03-05 LAB — URINALYSIS, ROUTINE W REFLEX MICROSCOPIC
GLUCOSE, UA: NEGATIVE mg/dL
KETONES UR: NEGATIVE mg/dL
Leukocytes, UA: NEGATIVE
Nitrite: NEGATIVE
PROTEIN: 30 mg/dL — AB
Specific Gravity, Urine: 1.025 (ref 1.005–1.030)
Urobilinogen, UA: 1 mg/dL (ref 0.0–1.0)
pH: 5.5 (ref 5.0–8.0)

## 2015-03-05 LAB — GLUCOSE, CAPILLARY
GLUCOSE-CAPILLARY: 98 mg/dL (ref 65–99)
Glucose-Capillary: 123 mg/dL — ABNORMAL HIGH (ref 65–99)

## 2015-03-05 LAB — CBC
HEMATOCRIT: 33.7 % — AB (ref 36.0–46.0)
HEMOGLOBIN: 10.8 g/dL — AB (ref 12.0–15.0)
MCH: 29.1 pg (ref 26.0–34.0)
MCHC: 32 g/dL (ref 30.0–36.0)
MCV: 90.8 fL (ref 78.0–100.0)
Platelets: 194 10*3/uL (ref 150–400)
RBC: 3.71 MIL/uL — ABNORMAL LOW (ref 3.87–5.11)
RDW: 14.3 % (ref 11.5–15.5)
WBC: 9.5 10*3/uL (ref 4.0–10.5)

## 2015-03-05 LAB — CREATININE, SERUM
Creatinine, Ser: 0.9 mg/dL (ref 0.44–1.00)
GFR calc Af Amer: >60 mL/min (ref >60)
GFR, EST NON AFRICAN AMERICAN: 56 mL/min — AB (ref >60)

## 2015-03-05 LAB — COMPREHENSIVE METABOLIC PANEL
ALK PHOS: 55 U/L (ref 38–126)
ALT: 10 U/L — ABNORMAL LOW (ref 14–54)
AST: 19 U/L (ref 15–41)
Albumin: 3.8 g/dL (ref 3.5–5.0)
Anion gap: 7 (ref 5–15)
BILIRUBIN TOTAL: 1.6 mg/dL — AB (ref 0.3–1.2)
BUN: 23 mg/dL — AB (ref 6–20)
CALCIUM: 9.4 mg/dL (ref 8.9–10.3)
CO2: 26 mmol/L (ref 22–32)
CREATININE: 1.04 mg/dL — AB (ref 0.44–1.00)
Chloride: 109 mmol/L (ref 101–111)
GFR calc Af Amer: 55 mL/min — ABNORMAL LOW (ref >60)
GFR, EST NON AFRICAN AMERICAN: 47 mL/min — AB (ref >60)
Glucose, Bld: 125 mg/dL — ABNORMAL HIGH (ref 65–99)
POTASSIUM: 3.7 mmol/L (ref 3.5–5.1)
Sodium: 142 mmol/L (ref 135–145)
TOTAL PROTEIN: 6.7 g/dL (ref 6.5–8.1)

## 2015-03-05 LAB — TSH: TSH: 0.796 u[IU]/mL (ref 0.350–4.500)

## 2015-03-05 LAB — CBC WITH DIFFERENTIAL/PLATELET
BASOS ABS: 0 10*3/uL (ref 0.0–0.1)
Basophils Relative: 0 %
Eosinophils Absolute: 0.1 10*3/uL (ref 0.0–0.7)
Eosinophils Relative: 1 %
HEMATOCRIT: 39.3 % (ref 36.0–46.0)
Hemoglobin: 12.5 g/dL (ref 12.0–15.0)
LYMPHS PCT: 12 %
Lymphs Abs: 1.2 10*3/uL (ref 0.7–4.0)
MCH: 28.9 pg (ref 26.0–34.0)
MCHC: 31.8 g/dL (ref 30.0–36.0)
MCV: 90.8 fL (ref 78.0–100.0)
MONO ABS: 0.4 10*3/uL (ref 0.1–1.0)
MONOS PCT: 4 %
NEUTROS ABS: 8.2 10*3/uL — AB (ref 1.7–7.7)
Neutrophils Relative %: 83 %
Platelets: 207 10*3/uL (ref 150–400)
RBC: 4.33 MIL/uL (ref 3.87–5.11)
RDW: 14.2 % (ref 11.5–15.5)
WBC: 9.8 10*3/uL (ref 4.0–10.5)

## 2015-03-05 LAB — I-STAT TROPONIN, ED: Troponin i, poc: 0.05 ng/mL (ref 0.00–0.08)

## 2015-03-05 LAB — URINE MICROSCOPIC-ADD ON

## 2015-03-05 LAB — I-STAT CG4 LACTIC ACID, ED: Lactic Acid, Venous: 0.94 mmol/L (ref 0.5–2.0)

## 2015-03-05 LAB — MAGNESIUM: Magnesium: 1.8 mg/dL (ref 1.7–2.4)

## 2015-03-05 LAB — PHOSPHORUS: PHOSPHORUS: 2.6 mg/dL (ref 2.5–4.6)

## 2015-03-05 MED ORDER — HEPARIN SODIUM (PORCINE) 5000 UNIT/ML IJ SOLN
5000.0000 [IU] | Freq: Three times a day (TID) | INTRAMUSCULAR | Status: DC
  Administered 2015-03-05 – 2015-03-08 (×8): 5000 [IU] via SUBCUTANEOUS
  Filled 2015-03-05 (×8): qty 1

## 2015-03-05 MED ORDER — SODIUM CHLORIDE 0.9 % IV BOLUS (SEPSIS)
1000.0000 mL | Freq: Once | INTRAVENOUS | Status: AC
  Administered 2015-03-05: 1000 mL via INTRAVENOUS

## 2015-03-05 MED ORDER — POLYETHYLENE GLYCOL 3350 17 G PO PACK
17.0000 g | PACK | Freq: Every day | ORAL | Status: DC | PRN
  Administered 2015-03-06: 17 g via ORAL
  Filled 2015-03-05: qty 1

## 2015-03-05 MED ORDER — INSULIN ASPART 100 UNIT/ML ~~LOC~~ SOLN
0.0000 [IU] | Freq: Three times a day (TID) | SUBCUTANEOUS | Status: DC
  Administered 2015-03-06: 2 [IU] via SUBCUTANEOUS
  Administered 2015-03-06 – 2015-03-07 (×2): 3 [IU] via SUBCUTANEOUS
  Administered 2015-03-08: 2 [IU] via SUBCUTANEOUS

## 2015-03-05 MED ORDER — RISAQUAD PO CAPS
1.0000 | ORAL_CAPSULE | Freq: Every day | ORAL | Status: DC
  Administered 2015-03-06 – 2015-03-08 (×3): 1 via ORAL
  Filled 2015-03-05 (×5): qty 1

## 2015-03-05 MED ORDER — DEXTROSE 5 % IV SOLN
1.0000 g | Freq: Once | INTRAVENOUS | Status: AC
  Administered 2015-03-05: 1 g via INTRAVENOUS
  Filled 2015-03-05: qty 10

## 2015-03-05 MED ORDER — ALUM & MAG HYDROXIDE-SIMETH 200-200-20 MG/5ML PO SUSP: 30.0000 mL | Freq: Four times a day (QID) | ORAL | Status: DC | PRN

## 2015-03-05 MED ORDER — ATORVASTATIN CALCIUM 10 MG PO TABS
10.0000 mg | ORAL_TABLET | Freq: Every day | ORAL | Status: DC
  Administered 2015-03-06 – 2015-03-07 (×2): 10 mg via ORAL
  Filled 2015-03-05 (×2): qty 1

## 2015-03-05 MED ORDER — DOCUSATE SODIUM 100 MG PO CAPS: 100.0000 mg | ORAL_CAPSULE | Freq: Two times a day (BID) | ORAL | Status: DC | PRN

## 2015-03-05 MED ORDER — DONEPEZIL HCL 10 MG PO TABS
10.0000 mg | ORAL_TABLET | Freq: Every day | ORAL | Status: DC
  Administered 2015-03-06 – 2015-03-07 (×2): 10 mg via ORAL
  Filled 2015-03-05 (×3): qty 1

## 2015-03-05 MED ORDER — ACETAMINOPHEN 325 MG PO TABS: 650.0000 mg | ORAL_TABLET | Freq: Four times a day (QID) | ORAL | Status: DC | PRN

## 2015-03-05 MED ORDER — ONDANSETRON HCL 4 MG/2ML IJ SOLN: 4.0000 mg | Freq: Four times a day (QID) | INTRAMUSCULAR | Status: DC | PRN

## 2015-03-05 MED ORDER — POLYVINYL ALCOHOL 1.4 % OP SOLN
1.0000 [drp] | OPHTHALMIC | Status: DC | PRN
  Filled 2015-03-05: qty 15

## 2015-03-05 MED ORDER — DEXTROSE 5 % IV SOLN
500.0000 mg | INTRAVENOUS | Status: DC
  Administered 2015-03-06 – 2015-03-07 (×2): 500 mg via INTRAVENOUS
  Filled 2015-03-05 (×3): qty 500

## 2015-03-05 MED ORDER — ACETAMINOPHEN 325 MG PO TABS
650.0000 mg | ORAL_TABLET | Freq: Once | ORAL | Status: AC
  Administered 2015-03-05: 650 mg via ORAL
  Filled 2015-03-05: qty 2

## 2015-03-05 MED ORDER — SODIUM CHLORIDE 0.9 % IV BOLUS (SEPSIS)
500.0000 mL | Freq: Once | INTRAVENOUS | Status: AC
  Administered 2015-03-05: 500 mL via INTRAVENOUS

## 2015-03-05 MED ORDER — ONDANSETRON HCL 4 MG PO TABS: 4.0000 mg | ORAL_TABLET | Freq: Four times a day (QID) | ORAL | Status: DC | PRN

## 2015-03-05 MED ORDER — IPRATROPIUM-ALBUTEROL 0.5-2.5 (3) MG/3ML IN SOLN: 3.0000 mL | Freq: Four times a day (QID) | RESPIRATORY_TRACT | Status: DC | PRN

## 2015-03-05 MED ORDER — DEXTROSE 5 % IV SOLN
500.0000 mg | Freq: Once | INTRAVENOUS | Status: AC
  Administered 2015-03-05: 500 mg via INTRAVENOUS
  Filled 2015-03-05: qty 500

## 2015-03-05 MED ORDER — SODIUM CHLORIDE 0.9 % IV SOLN
INTRAVENOUS | Status: AC
  Administered 2015-03-05: 18:00:00 via INTRAVENOUS

## 2015-03-05 MED ORDER — DEXTROSE 5 % IV SOLN
1.0000 g | INTRAVENOUS | Status: DC
  Administered 2015-03-06 – 2015-03-07 (×2): 1 g via INTRAVENOUS
  Filled 2015-03-05 (×3): qty 10

## 2015-03-05 MED ORDER — GUAIFENESIN ER 600 MG PO TB12
600.0000 mg | ORAL_TABLET | Freq: Two times a day (BID) | ORAL | Status: DC
  Administered 2015-03-06 – 2015-03-08 (×5): 600 mg via ORAL
  Filled 2015-03-05 (×6): qty 1

## 2015-03-05 MED ORDER — ADULT MULTIVITAMIN W/MINERALS CH
2.0000 | ORAL_TABLET | Freq: Every day | ORAL | Status: DC
Start: 2015-03-05 — End: 2015-03-08
  Administered 2015-03-06 – 2015-03-08 (×3): 2 via ORAL
  Filled 2015-03-05 (×3): qty 2

## 2015-03-05 NOTE — ED Notes (Signed)
Bed: WA06 Expected date: 03/05/15 Expected time: 12:35 PM Means of arrival: Ambulance Comments: Shob

## 2015-03-05 NOTE — ED Notes (Signed)
Nurse drawing labs. 

## 2015-03-05 NOTE — ED Notes (Signed)
Per Ptar pt from home, co sob started 24 hours ago, weakness. O2 sat 88 RA. Pt persents with non-breather mask O2 incresed to 96 % per EMS. Pt is alert and oriented x4 yet appears extremely lethargic. Pt is DNR and form in rm. Per daughter pt might had fever . CBG 156 by EMS. Pt denies pain.

## 2015-03-05 NOTE — ED Notes (Signed)
Code sepsis called.

## 2015-03-05 NOTE — Progress Notes (Signed)
ANTIBIOTIC CONSULT NOTE - INITIAL  Pharmacy Consult for Ceftriaxone and Azithromycin Indication: CAP  No Known Allergies  Patient Measurements:    As of 12/15/14: Height: 4'10" Weight: 47.9 kg  Vital Signs: Temp: 98.6 F (37 C) (10/08 1700) Temp Source: Axillary (10/08 1700) BP: 141/42 mmHg (10/08 1700) Pulse Rate: 63 (10/08 1700) Intake/Output from previous day:   Intake/Output from this shift:    Labs:  Recent Labs  03/05/15 1321  WBC 9.8  HGB 12.5  PLT 207  CREATININE 1.04*   CrCl cannot be calculated (Unknown ideal weight.). No results for input(s): VANCOTROUGH, VANCOPEAK, VANCORANDOM, GENTTROUGH, GENTPEAK, GENTRANDOM, TOBRATROUGH, TOBRAPEAK, TOBRARND, AMIKACINPEAK, AMIKACINTROU, AMIKACIN in the last 72 hours.   Microbiology: No results found for this or any previous visit (from the past 720 hour(s)).  Medical History: Past Medical History  Diagnosis Date  . Chicken pox as a child  . Measles as a child  . IBS (irritable bowel syndrome)   . Diabetes mellitus 47    type 2  . Hyperlipidemia 72  . Hypertension 72  . Vision loss of right eye   . Vertigo 2010    benign  . Calcification of cartilage     ear  . Cardiovascular system disease 07/02/2011  . Hemorrhoid 07/02/2011  . Osteoporosis 07/02/2011  . Weight loss 07/31/2011  . Anemia 05/22/2012  . Broken wrist 10-22-11    left  . Depression 05/22/2012  . Dermatitis 05/22/2012    Right neck    Medications:  Scheduled:  . acidophilus  1 capsule Oral Daily  . atorvastatin  10 mg Oral q1800  . [START ON 03/06/2015] azithromycin  500 mg Intravenous Q24H  . [START ON 03/06/2015] cefTRIAXone (ROCEPHIN)  IV  1 g Intravenous Q24H  . donepezil  10 mg Oral QHS  . guaiFENesin  600 mg Oral BID  . heparin  5,000 Units Subcutaneous 3 times per day  . [START ON 03/06/2015] insulin aspart  0-9 Units Subcutaneous TID WC  . multivitamin with minerals  2 tablet Oral Daily   Infusions:  . sodium chloride 75 mL/hr at  03/05/15 1800   PRN: acetaminophen, alum & mag hydroxide-simeth, docusate sodium, ipratropium-albuterol, ondansetron **OR** ondansetron (ZOFRAN) IV, polyethylene glycol, polyvinyl alcohol  Assessment: 81 yo F with a chief complaint of cough congestion and fever/chills. CXR unremarkable, possibly due to dehdration. Since she has not been hospitalized in the last 3 months, will treat as CAP with ceftriaxone and azithromycin.  Goal of Therapy:  Eradication of infection  Plan:   Ceftriaxone 1g IV q24h x 7 days  Azithromycin 500mg  IV q24h x 7 days  No dosing adjustments anticipated, Pharmacy will sign-off.  Please re-consult if clinical situation warrants  Peggyann Juba, PharmD, BCPS Pager: [phone] 03/05/2015 6:24 PM

## 2015-03-05 NOTE — ED Provider Notes (Signed)
CSN: [id number]     Arrival date & time 03/05/15  1246 History   First MD Initiated Contact with Patient 03/05/15 1257     Chief Complaint  Patient presents with  . Shortness of Breath     (Consider location/radiation/quality/duration/timing/severity/associated sxs/prior Treatment) Patient is a 81 y.o. female presenting with general illness. The history is provided by the patient.  Illness Severity:  Severe Onset quality:  Sudden Duration:  2 days Timing:  Constant Progression:  Worsening Chronicity:  New Associated symptoms: congestion, cough and fever   Associated symptoms: no chest pain, no headaches, no myalgias, no nausea, no rhinorrhea, no shortness of breath, no vomiting and no wheezing    81 yo F with a chief complaint of cough congestion and fever. This been going on for couple days. Family says she became significantly more weak today and unable to get up and move around. Patient is normally able to complete most of her ADLs at home. Has had a progressive deterioration since she broke her hip in February of this year. Having fevers and chills at home. Is been incontinent of urine for the past couple weeks.  Past Medical History  Diagnosis Date  . Chicken pox as a child  . Measles as a child  . IBS (irritable bowel syndrome)   . Diabetes mellitus 47    type 2  . Hyperlipidemia 72  . Hypertension 72  . Vision loss of right eye   . Vertigo 2010    benign  . Calcification of cartilage     ear  . Cardiovascular system disease 07/02/2011  . Hemorrhoid 07/02/2011  . Osteoporosis 07/02/2011  . Weight loss 07/31/2011  . Anemia 05/22/2012  . Broken wrist 10-22-11    left  . Depression 05/22/2012  . Dermatitis 05/22/2012    Right neck   Past Surgical History  Procedure Laterality Date  . Hemorroidectomy  1979  . Knee scoped  2002    right  . Rotator cuff repair  2004    right  . Abdominal hysterectomy  2008    partial still has ovaries  . Lens implant left  2006  .  Pacemaker insertion  2010  . Wrist surgery  11-05-11    left wrist  . Intramedullary (im) nail intertrochanteric Right 07/24/2013    Procedure: INTRAMEDULLARY (IM) NAIL INTERTROCHANTRIC;  Surgeon: Gearlean Alf, MD;  Location: [facility];  Service: Orthopedics;  Laterality: Right;   Family History  Problem Relation Age of Onset  . Diabetes Mother     type 2  . Hypertension Mother   . Cancer Brother     lung-smoker  . Diabetes Daughter     pre diabetic  . Diabetes Son     type 2  . Cancer Maternal Grandfather     prostate   Social History  Substance Use Topics  . Smoking status: Never Smoker   . Smokeless tobacco: Never Used  . Alcohol Use: No   OB History    No data available     Review of Systems  Constitutional: Positive for fever. Negative for chills.  HENT: Positive for congestion. Negative for rhinorrhea.   Eyes: Negative for redness and visual disturbance.  Respiratory: Positive for cough. Negative for shortness of breath and wheezing.   Cardiovascular: Negative for chest pain and palpitations.  Gastrointestinal: Negative for nausea and vomiting.  Genitourinary: Negative for dysuria and urgency.  Musculoskeletal: Negative for myalgias and arthralgias.  Skin: Negative for pallor and wound.  Neurological: Positive for weakness. Negative for dizziness and headaches.      Allergies  Review of patient's allergies indicates no known allergies.  Home Medications   Prior to Admission medications   Medication Sig Start Date End Date Taking? Authorizing Provider  acetaminophen (TYLENOL) 325 MG tablet Take 2 tablets (650 mg total) by mouth every 6 (six) hours as needed for mild pain (or Fever >/= 101). 07/27/13  Yes Arlee Muslim, PA-C  alum & mag hydroxide-simeth (MAALOX/MYLANTA) 200-200-20 MG/5ML suspension Take 30 mLs by mouth every 6 (six) hours as needed for indigestion or heartburn (dyspepsia). 07/27/13  Yes Kelvin Cellar, MD  atorvastatin (LIPITOR) 10 MG tablet TAKE 1  TABLET (10 MG TOTAL) BY MOUTH DAILY. 10/21/14  Yes Mosie Lukes, MD  bisacodyl (DULCOLAX) 10 MG suppository Place 1 suppository (10 mg total) rectally daily as needed for moderate constipation. 07/27/13  Yes Kelvin Cellar, MD  docusate sodium (COLACE) 100 MG capsule Take 100 mg by mouth 2 (two) times daily as needed for mild constipation.   Yes Historical Provider, MD  donepezil (ARICEPT) 10 MG tablet Take 1 tablet (10 mg total) by mouth at bedtime. 10/21/14  Yes Mosie Lukes, MD  metFORMIN (GLUCOPHAGE-XR) 500 MG 24 hr tablet TAKE 1 TABLET (500 MG TOTAL) BY MOUTH DAILY WITH BREAKFAST. 10/21/14  Yes Mosie Lukes, MD  methylcellulose (ARTIFICIAL TEARS) 1 % ophthalmic solution Place 1 drop into both eyes as needed (dry eyes).    Yes Historical Provider, MD  Multiple Vitamin (MULTIVITAMIN WITH MINERALS) TABS tablet Take 2 tablets by mouth daily.   Yes Historical Provider, MD  polyethylene glycol (MIRALAX / GLYCOLAX) packet Take 17 g by mouth daily as needed for mild constipation. 07/27/13  Yes Kelvin Cellar, MD  Probiotic Product (PROBIOTIC DAILY PO) Take 1 tablet by mouth daily.   Yes Historical Provider, MD   BP 170/90 mmHg  Pulse 60  Temp(Src) 100.7 F (38.2 C) (Rectal)  Resp 21  SpO2 94% Physical Exam  Constitutional: She is oriented to person, place, and time. She appears cachectic. No distress.  HENT:  Head: Normocephalic and atraumatic.  Eyes: EOM are normal. Pupils are equal, round, and reactive to light.  Neck: Normal range of motion. Neck supple.  Cardiovascular: Normal rate and regular rhythm.  Exam reveals no gallop and no friction rub.   No murmur heard. Pulmonary/Chest: Effort normal. She has no wheezes. She has rhonchi in the left middle field and the left lower field. She has no rales.  Abdominal: Soft. She exhibits no distension. There is no tenderness. There is no rebound and no guarding.  Musculoskeletal: She exhibits no edema or tenderness.  Neurological: She is alert  and oriented to person, place, and time.  Skin: Skin is warm and dry. She is not diaphoretic.  Psychiatric: She has a normal mood and affect. Her behavior is normal.    ED Course  Procedures (including critical care time) Labs Review Labs Reviewed  CBC WITH DIFFERENTIAL/PLATELET - Abnormal; Notable for the following:    Neutro Abs 8.2 (*)    All other components within normal limits  COMPREHENSIVE METABOLIC PANEL - Abnormal; Notable for the following:    Glucose, Bld 125 (*)    BUN 23 (*)    Creatinine, Ser 1.04 (*)    ALT 10 (*)    Total Bilirubin 1.6 (*)    GFR calc non Af Amer 47 (*)    GFR calc Af Amer 55 (*)    All  other components within normal limits  URINALYSIS, ROUTINE W REFLEX MICROSCOPIC (NOT AT [facility]) - Abnormal; Notable for the following:    Color, Urine AMBER (*)    Hgb urine dipstick SMALL (*)    Bilirubin Urine SMALL (*)    Protein, ur 30 (*)    All other components within normal limits  CULTURE, BLOOD (SINGLE)  URINE MICROSCOPIC-ADD ON  I-STAT TROPOININ, ED  I-STAT CG4 LACTIC ACID, ED    Imaging Review Ct Head Wo Contrast  03/05/2015   CLINICAL DATA:  Patient with weakness. Shortness of breath. Lethargy.  EXAM: CT HEAD WITHOUT CONTRAST  TECHNIQUE: Contiguous axial images were obtained from the base of the skull through the vertex without intravenous contrast.  COMPARISON:  Brain CT 08/21/2013  FINDINGS: Ventricles and sulci are prominent compatible with atrophy. Extensive periventricular and subcortical white matter hypodensity compatible with chronic small vessel ischemic changes. Old bilateral basal ganglia lacunar infarcts. No evidence for acute cortically based infarct, intracranial hemorrhage or mass effect. Unchanged 18 mm ossified mass within the subfrontal region on the right adjacent to the falx. Orbits are unremarkable. Paranasal sinuses are well aerated. Mastoid air cells are unremarkable. Calvarium is intact.  IMPRESSION: No acute intracranial process.   Chronic small vessel ischemic changes cortical atrophy.  Stable probable calcified meningioma within the frontal lobe adjacent to the falx.   Electronically Signed   By: Lovey Newcomer M.D.   On: 03/05/2015 14:38   Dg Chest Port 1 View  03/05/2015   CLINICAL DATA:  Nursing home patient with 24 hour duration weakness and low oxygen saturation.  EXAM: PORTABLE CHEST 1 VIEW  COMPARISON:  07/24/2013  FINDINGS: Dual lead pacemaker appears unchanged. There is chronic cardiomegaly with left ventricular prominence. There is newly seen venous hypertension with early interstitial edema. Chronic mitral valve calcification and calcified left hilar nodes. No effusions. Old traumatic changes in the region of the left shoulder.  IMPRESSION: Cardiomegaly with left ventricular prominence. Venous hypertension with early interstitial edema.   Electronically Signed   By: Nelson Chimes M.D.   On: 03/05/2015 13:49   I have personally reviewed and evaluated these images and lab results as part of my medical decision-making.   EKG Interpretation   Date/Time:  Saturday March 05 2015 13:03:26 EDT Ventricular Rate:  65 PR Interval:  274 QRS Duration: 151 QT Interval:  438 QTC Calculation: 455 R Axis:   -64 Text Interpretation:  Atrial-sensed ventricular-paced rhythm No further  analysis attempted due to paced rhythm No significant change since last  tracing Confirmed by Valetta Mulroy MD, Quillian Quince ([id number]) on 03/05/2015 1:15:03 PM      MDM   Final diagnoses:  CAP (community acquired pneumonia)  Weakness    81 yo F with a chief complaint of cough congestion and fever. Patient febrile very weak on exam. Slow to respond but following commands appropriately. Suspect pneumonia. Has not been hospitalized the last 3 months. Hemodynamically stable. Hypoxic on arrival. Febrile with mental status change, code sepsis initiated.  Covered with rocephin azithro.   CXR unremarkable, possibly due to dehdration.  Labwork otherwise  unremarkable.  Admit for probable CAP.   The patients results and plan were reviewed and discussed.   Any x-rays performed were independently reviewed by myself.   Differential diagnosis were considered with the presenting HPI.  Medications  azithromycin (ZITHROMAX) 500 mg in dextrose 5 % 250 mL IVPB (500 mg Intravenous New Bag/Given 03/05/15 1426)  sodium chloride 0.9 % bolus 1,000 mL (1,000 mLs  Intravenous New Bag/Given 03/05/15 1333)  sodium chloride 0.9 % bolus 500 mL (500 mLs Intravenous New Bag/Given 03/05/15 1335)  cefTRIAXone (ROCEPHIN) 1 g in dextrose 5 % 50 mL IVPB (0 g Intravenous Stopped 03/05/15 1421)    Filed Vitals:   03/05/15 1312 03/05/15 1453  BP: 160/61 170/90  Pulse: 66 60  Temp: 100.7 F (38.2 C)   TempSrc: Rectal   Resp: 22 21  SpO2: 96% 94%    Final diagnoses:  CAP (community acquired pneumonia)  Weakness    Admission/ observation were discussed with the admitting physician, patient and/or family and they are comfortable with the plan.    Deno Etienne, DO 03/05/15 1517

## 2015-03-05 NOTE — H&P (Signed)
[facility] History and Physical  Briana Thornton ION:[id number] DOB: 1926/11/21 DOA: 03/05/2015  Referring physician: Dr. Tyrone Nine PCP: Penni Homans, MD   Chief Complaint: SOB, generalized weakness, low grade temp  HPI: Briana Thornton is a 81 y.o. female with PMH significant for HTN, HLD, Diabetes mellitus (not on insulin) and hx of dementia w/o behavioral disturbances; who presented to ED secondary to generalized weakness, low grade fever and SOB. Patient reported sx's has been present for the last 2 days prior to admission and steadily worsening. There has been some associated decrease in capacity of performing ADL's and some low grade temp treated at home with tylenol. Patient denies CP, hemoptysis, hematuria, abd pain, nausea/vomiting, leg swelling, HA's, dysuria, melena or any other complaints. In the ED she was found to be mildly hypoxic on RA (87-88 %); with diffuse rhonchi, neg CXR, mildly tachypneic and very deconditioned. [facility] called to admit patient for further evaluation and treatment.  Of note, family reported decrease PO intake in the last 48 hours or so as well.    Review of Systems:  Negative except as mentioned on HPI.  Past Medical History  Diagnosis Date  . Chicken pox as a child  . Measles as a child  . IBS (irritable bowel syndrome)   . Diabetes mellitus 47    type 2  . Hyperlipidemia 72  . Hypertension 72  . Vision loss of right eye   . Vertigo 2010    benign  . Calcification of cartilage     ear  . Cardiovascular system disease 07/02/2011  . Hemorrhoid 07/02/2011  . Osteoporosis 07/02/2011  . Weight loss 07/31/2011  . Anemia 05/22/2012  . Broken wrist 10-22-11    left  . Depression 05/22/2012  . Dermatitis 05/22/2012    Right neck   Past Surgical History  Procedure Laterality Date  . Hemorroidectomy  1979  . Knee scoped  2002    right  . Rotator cuff repair  2004    right  . Abdominal hysterectomy  2008    partial still has ovaries  . Lens implant  left  2006  . Pacemaker insertion  2010  . Wrist surgery  11-05-11    left wrist  . Intramedullary (im) nail intertrochanteric Right 07/24/2013    Procedure: INTRAMEDULLARY (IM) NAIL INTERTROCHANTRIC;  Surgeon: Gearlean Alf, MD;  Location: [facility];  Service: Orthopedics;  Laterality: Right;   Social History:  reports that she has never smoked. She has never used smokeless tobacco. She reports that she does not drink alcohol or use illicit drugs.  No Known Allergies  Family History  Problem Relation Age of Onset  . Diabetes Mother     type 2  . Hypertension Mother   . Cancer Brother     lung-smoker  . Diabetes Daughter     pre diabetic  . Diabetes Son     type 2  . Cancer Maternal Grandfather     prostate    Prior to Admission medications   Medication Sig Start Date End Date Taking? Authorizing Provider  acetaminophen (TYLENOL) 325 MG tablet Take 2 tablets (650 mg total) by mouth every 6 (six) hours as needed for mild pain (or Fever >/= 101). 07/27/13  Yes Arlee Muslim, PA-C  alum & mag hydroxide-simeth (MAALOX/MYLANTA) 200-200-20 MG/5ML suspension Take 30 mLs by mouth every 6 (six) hours as needed for indigestion or heartburn (dyspepsia). 07/27/13  Yes Kelvin Cellar, MD  atorvastatin (LIPITOR) 10 MG tablet TAKE 1 TABLET (10  MG TOTAL) BY MOUTH DAILY. 10/21/14  Yes Mosie Lukes, MD  bisacodyl (DULCOLAX) 10 MG suppository Place 1 suppository (10 mg total) rectally daily as needed for moderate constipation. 07/27/13  Yes Kelvin Cellar, MD  docusate sodium (COLACE) 100 MG capsule Take 100 mg by mouth 2 (two) times daily as needed for mild constipation.   Yes Historical Provider, MD  donepezil (ARICEPT) 10 MG tablet Take 1 tablet (10 mg total) by mouth at bedtime. 10/21/14  Yes Mosie Lukes, MD  metFORMIN (GLUCOPHAGE-XR) 500 MG 24 hr tablet TAKE 1 TABLET (500 MG TOTAL) BY MOUTH DAILY WITH BREAKFAST. 10/21/14  Yes Mosie Lukes, MD  methylcellulose (ARTIFICIAL TEARS) 1 % ophthalmic  solution Place 1 drop into both eyes as needed (dry eyes).    Yes Historical Provider, MD  Multiple Vitamin (MULTIVITAMIN WITH MINERALS) TABS tablet Take 2 tablets by mouth daily.   Yes Historical Provider, MD  polyethylene glycol (MIRALAX / GLYCOLAX) packet Take 17 g by mouth daily as needed for mild constipation. 07/27/13  Yes Kelvin Cellar, MD  Probiotic Product (PROBIOTIC DAILY PO) Take 1 tablet by mouth daily.   Yes Historical Provider, MD   Physical Exam: Filed Vitals:   03/05/15 1312 03/05/15 1453  BP: 160/61 170/90  Pulse: 66 60  Temp: 100.7 F (38.2 C)   TempSrc: Rectal   Resp: 22 21  SpO2: 96% 94%    Wt Readings from Last 3 Encounters:  12/15/14 47.9 kg (105 lb 9.6 oz)  10/21/14 50.122 kg (110 lb 8 oz)  01/06/14 48.762 kg (107 lb 8 oz)    General:  Appears calm, frail, chronically in appearance and with nasal congested voice. Warm to touch. Able to follow commands and answer questions appropriately   Eyes: PERRL, normal lids, irises & conjunctiva, no icterus, no nystagmus  ENT: grossly normal hearing, dry lips & tongue, no thrush, no erythema, no exudates. Nasally congested but no visible drainage  Neck: no LAD, masses or thyromegaly, no JVD Cardiovascular: Regular rate, no rubs, no gallops, positive soft murmur. No Lee dema Respiratory: positive rhonchi, L > right, no rales, no frank wheezing. No use of accessory muscles. Positive mild tachypnea. Abdomen: soft, nt, nd, positive BS, no guarding, positive BS Skin: no rash, no petechiae, no bruises.   Musculoskeletal: grossly normal tone BUE/BLE, no joint swelling  Psychiatric: no hallucinations, no SI Neurologic: grossly non-focal; CN intact and no focal deficit appreciated on exam          Labs on Admission:  Basic Metabolic Panel:  Recent Labs Lab 03/05/15 1321  NA 142  K 3.7  CL 109  CO2 26  GLUCOSE 125*  BUN 23*  CREATININE 1.04*  CALCIUM 9.4   Liver Function Tests:  Recent Labs Lab 03/05/15 1321    AST 19  ALT 10*  ALKPHOS 55  BILITOT 1.6*  PROT 6.7  ALBUMIN 3.8   CBC:  Recent Labs Lab 03/05/15 1321  WBC 9.8  NEUTROABS 8.2*  HGB 12.5  HCT 39.3  MCV 90.8  PLT 207   CBG: No results for input(s): GLUCAP in the last 168 hours.  Radiological Exams on Admission: Ct Head Wo Contrast  03/05/2015   CLINICAL DATA:  Patient with weakness. Shortness of breath. Lethargy.  EXAM: CT HEAD WITHOUT CONTRAST  TECHNIQUE: Contiguous axial images were obtained from the base of the skull through the vertex without intravenous contrast.  COMPARISON:  Brain CT 08/21/2013  FINDINGS: Ventricles and sulci are prominent compatible with  atrophy. Extensive periventricular and subcortical white matter hypodensity compatible with chronic small vessel ischemic changes. Old bilateral basal ganglia lacunar infarcts. No evidence for acute cortically based infarct, intracranial hemorrhage or mass effect. Unchanged 18 mm ossified mass within the subfrontal region on the right adjacent to the falx. Orbits are unremarkable. Paranasal sinuses are well aerated. Mastoid air cells are unremarkable. Calvarium is intact.  IMPRESSION: No acute intracranial process.  Chronic small vessel ischemic changes cortical atrophy.  Stable probable calcified meningioma within the frontal lobe adjacent to the falx.   Electronically Signed   By: Lovey Newcomer M.D.   On: 03/05/2015 14:38   Dg Chest Port 1 View  03/05/2015   CLINICAL DATA:  Nursing home patient with 24 hour duration weakness and low oxygen saturation.  EXAM: PORTABLE CHEST 1 VIEW  COMPARISON:  07/24/2013  FINDINGS: Dual lead pacemaker appears unchanged. There is chronic cardiomegaly with left ventricular prominence. There is newly seen venous hypertension with early interstitial edema. Chronic mitral valve calcification and calcified left hilar nodes. No effusions. Old traumatic changes in the region of the left shoulder.  IMPRESSION: Cardiomegaly with left ventricular  prominence. Venous hypertension with early interstitial edema.   Electronically Signed   By: Nelson Chimes M.D.   On: 03/05/2015 13:49     EKG: Rate controlled, ventricular pace. No acute ischemic changes appreciated and no significant difference form previous tracing   Assessment/Plan 1-SOB and hypoxia due to CAP (community acquired pneumonia): CURB 65 is 2; patient is frail and with hypoxia on RA -will treat with rocephin and zithromax -will check blood cx's, urine cx;s and legionella/strep antigen and influenza by PCR -follow sputum cx's -PRN nebulizer treatment, oxygen supplementation and mucinex -will provide supportive care and follow clinical response   2-Hypertension: stable and well controlled -will monitor VS -currently stable and at home not taking any antihypertensive regimen  3-Hyperlipidemia: will continue statins   4-DM (diabetes mellitus) ([place]): well controlled -will hold oral hypoglycemic regimen while inpatient and will use SSI  5-Pacemaker-St.Jude: stable and recently evaluated according to daughter reports  6-Dementia without behavioral disturbance: will continue aricept and supportive care  7-Dysphagia: will initiate dysphagia 3 diet and thin liquids -per family request will ask for SPL; presumed further decline and concerns for aspiration as per daughter reports   8-Weakness: will ask for PT/OT -up with assistance  9-Dehydration, mild: patient has not been eating and drinking much lately -will provide gentle IVF's resuscitation and will follow volume status   Code Status: DNR DVT Prophylaxis:heparin  Family Communication: daughter at bedside  Disposition Plan: inpatient, LOS > 2 midnights, med-surg bed  Time spent: 65 minutes (> 50% of the time dedicated to face to face evaluation and discussion with patient's family at bedside regarding plan of care and treatment)  [place], [facility] Pager [phone]

## 2015-03-06 ENCOUNTER — Inpatient Hospital Stay (HOSPITAL_COMMUNITY): Payer: Medicare Other

## 2015-03-06 LAB — HIV ANTIBODY (ROUTINE TESTING W REFLEX): HIV Screen 4th Generation wRfx: NONREACTIVE

## 2015-03-06 LAB — EXPECTORATED SPUTUM ASSESSMENT W GRAM STAIN, RFLX TO RESP C

## 2015-03-06 LAB — BASIC METABOLIC PANEL
ANION GAP: 7 (ref 5–15)
BUN: 21 mg/dL — ABNORMAL HIGH (ref 6–20)
CHLORIDE: 111 mmol/L (ref 101–111)
CO2: 24 mmol/L (ref 22–32)
Calcium: 8.6 mg/dL — ABNORMAL LOW (ref 8.9–10.3)
Creatinine, Ser: 1 mg/dL (ref 0.44–1.00)
GFR calc Af Amer: 57 mL/min — ABNORMAL LOW (ref >60)
GFR, EST NON AFRICAN AMERICAN: 50 mL/min — AB (ref >60)
Glucose, Bld: 110 mg/dL — ABNORMAL HIGH (ref 65–99)
POTASSIUM: 3.5 mmol/L (ref 3.5–5.1)
SODIUM: 142 mmol/L (ref 135–145)

## 2015-03-06 LAB — GLUCOSE, CAPILLARY
GLUCOSE-CAPILLARY: 89 mg/dL (ref 65–99)
GLUCOSE-CAPILLARY: 244 mg/dL — AB (ref 65–99)
GLUCOSE-CAPILLARY: 66 mg/dL (ref 65–99)
GLUCOSE-CAPILLARY: 115 mg/dL — AB (ref 65–99)
Glucose-Capillary: 191 mg/dL — ABNORMAL HIGH (ref 65–99)

## 2015-03-06 LAB — CBC
HEMATOCRIT: 33.8 % — AB (ref 36.0–46.0)
HEMOGLOBIN: 10.9 g/dL — AB (ref 12.0–15.0)
MCH: 29.5 pg (ref 26.0–34.0)
MCHC: 32.2 g/dL (ref 30.0–36.0)
MCV: 91.4 fL (ref 78.0–100.0)
Platelets: 175 10*3/uL (ref 150–400)
RBC: 3.7 MIL/uL — AB (ref 3.87–5.11)
RDW: 14.4 % (ref 11.5–15.5)
WBC: 7.7 10*3/uL (ref 4.0–10.5)

## 2015-03-06 LAB — EXPECTORATED SPUTUM ASSESSMENT W REFEX TO RESP CULTURE

## 2015-03-06 LAB — INFLUENZA PANEL BY PCR (TYPE A & B)
H1N1FLUPCR: NOT DETECTED
INFLBPCR: NEGATIVE
Influenza A By PCR: NEGATIVE

## 2015-03-06 LAB — STREP PNEUMONIAE URINARY ANTIGEN: STREP PNEUMO URINARY ANTIGEN: NEGATIVE

## 2015-03-06 MED ORDER — SODIUM CHLORIDE 0.9 % IV SOLN
INTRAVENOUS | Status: DC
  Administered 2015-03-06 – 2015-03-08 (×3): via INTRAVENOUS

## 2015-03-06 MED ORDER — ACETAMINOPHEN 650 MG RE SUPP
650.0000 mg | RECTAL | Status: DC | PRN
  Administered 2015-03-06: 650 mg via RECTAL
  Filled 2015-03-06: qty 1

## 2015-03-06 NOTE — Accreditation Note (Signed)
Patient spiked a fever at about 0236, Temp was 102.1 axillary, [facility] night coverage was notified, 650mg  Acetaminophen was administered rectally, no further orders was received.

## 2015-03-06 NOTE — Clinical Social Work Note (Signed)
Clinical Social Work Assessment  Patient Details  Name: Briana Thornton MRN: [id number] Date of Birth: 13-Jun-1926  Date of referral:  03/06/15               Reason for consult:  Facility Placement                Permission sought to share information with:  Family Supports Permission granted to share information::  Yes, Release of Information Signed (pt documents- release of info to daughter)  Name::     Sport and exercise psychologist::  [facility]  Relationship::  daughter  Sport and exercise psychologist Information:     Housing/Transportation Living arrangements for the past 2 months:  Single Family Home Source of Information:  Adult Children Patient Interpreter Needed:  None Criminal Activity/Legal Involvement Pertinent to Current Situation/Hospitalization:  No - Comment as needed Significant Relationships:  Adult Children Lives with:  Adult Children Do you feel safe going back to the place where you live?  Yes Need for family participation in patient care:  Yes (Comment) (pt has dementia)  Care giving concerns:  Pt lives with daughter- daughter works and is unable to take care of pt with current level of impairment   Facilities manager / plan:  CSW spoke with pt daughter concerning PT recommendation for SNF  Employment status:  Retired Forensic scientist:  Medicare PT Recommendations:  [place] / Referral to community resources:  [place]  Patient/Family's Response to care:  Pt dtr is agreeable to SNF- expresses preference for [facility] since pt has been there in the past.  Pt dtr is a Therapist, sports at [employer] and is very realistic about pt current level of needs.  CSW explained SNF referral process and made pt dtr aware that pt would need 3 night inpatient stay to qualify for Medicare to cover a SNF stay- pt dtr expressed understanding  Patient/Family's Understanding of and Emotional Response to Diagnosis, Current Treatment, and Prognosis:  Pt dtr is  very knowledgeable about pt current condition and has no questions or concerns at this time  Emotional Assessment Appearance:  Appears stated age Attitude/Demeanor/Rapport:  Unable to Assess Affect (typically observed):  Unable to Assess Orientation:  Oriented to Self, Oriented to Place, Oriented to  Time, Fluctuating Orientation (Suspected and/or reported Sundowners) Alcohol / Substance use:  Not Applicable Psych involvement (Current and /or in the community):  No (Comment)  Discharge Needs  Concerns to be addressed:  Care Coordination Readmission within the last 30 days:  No Current discharge risk:  Physical Impairment Barriers to Discharge:  Continued Medical Work up   [employer], LCSW 03/06/2015, 4:17 PM

## 2015-03-06 NOTE — Clinical Social Work Placement (Signed)
   CLINICAL SOCIAL WORK PLACEMENT  NOTE  Date:  03/06/2015  Patient Details  Name: Briana Thornton MRN: [id number] Date of Birth: 10-May-1927  Clinical Social Work is seeking post-discharge placement for this patient at the [place] level of care (*CSW will initial, date and re-position this form in  chart as items are completed):  Yes   Patient/family provided with [place] Work Department's list of facilities offering this level of care within the geographic area requested by the patient (or if unable, by the patient's family).  Yes   Patient/family informed of their freedom to choose among providers that offer the needed level of care, that participate in Medicare, Medicaid or managed care program needed by the patient, have an available bed and are willing to accept the patient.  Yes   Patient/family informed of 's ownership interest in [facility] and [facility], as well as of the fact that they are under no obligation to receive care at these facilities.  PASRR submitted to EDS on       PASRR number received on       Existing PASRR number confirmed on 03/06/15     FL2 transmitted to all facilities in geographic area requested by pt/family on 03/06/15     FL2 transmitted to all facilities within larger geographic area on       Patient informed that his/her managed care company has contracts with or will negotiate with certain facilities, including the following:            Patient/family informed of bed offers received.  Patient chooses bed at       Physician recommends and patient chooses bed at      Patient to be transferred to   on  .  Patient to be transferred to facility by       Patient family notified on   of transfer.  Name of family member notified:        PHYSICIAN Please sign FL2     Additional Comment:    _______________________________________________ Cranford Mon, LCSW 03/06/2015, 4:21 PM

## 2015-03-06 NOTE — Progress Notes (Signed)
Unable to obtain a sputum for gram stain, pt unable to spit up secretion.

## 2015-03-06 NOTE — Evaluation (Signed)
Occupational Therapy Evaluation Patient Details Name: Briana Thornton MRN: [id number] DOB: 28-Apr-1927 Today's Date: 03/06/2015    History of Present Illness Briana Thornton is a 81 y.o. female with PMH significant for HTN, HLD, Diabetes mellitus (not on insulin) and hx of dementia w/o behavioral disturbances; who presented to ED 03/05/15  secondary to generalized weakness, low grade fever and SOB. Patient reported sx's has been present for the last 2 days prior to admission and steadily worsening. There has been some associated decrease in capacity of performing ADL's and some low grade temp treated at home with tylenol Chest xray reveals L>R pleural effusions.   Clinical Impression   This 81 yo female admitted with above presents to acute OT with decreased balance/mobility, increased lethargy, decreased cognition (pre-existing) all affecting her ability to take care of herself in her breeze [address] attached apartment at her daughter's house. She will benefit from acute OT with follow up OT at SNF.    Follow Up Recommendations  SNF    Equipment Recommendations   (TBD at next venue)       Precautions / Restrictions Precautions Precautions: Fall Precaution Comments: droplet--discontinued 03/06/15 Restrictions Weight Bearing Restrictions: No      Mobility Bed Mobility               General bed mobility comments: Pt up in recliner upon my arrival  Transfers Overall transfer level: Needs assistance Equipment used: Rolling walker (2 wheeled) Transfers: Sit to/from Stand Sit to Stand: Mod assist              Balance Overall balance assessment: Needs assistance;History of Falls Sitting-balance support: Feet supported;Bilateral upper extremity supported Sitting balance-[name]: Poor   Postural control: Posterior lean Standing balance support: Bilateral upper extremity supported Standing balance-[name]: Poor                              ADL Overall ADL's  : Needs assistance/impaired Eating/Feeding: Set up;Sitting   Grooming: Set up;Supervision/safety;Sitting   Upper Body Bathing: Set up;Supervision/ safety;Sitting   Lower Body Bathing: Moderate assistance (with Mod A sit<>stand)   Upper Body Dressing : Moderate assistance;Sitting   Lower Body Dressing: Maximal assistance (with Mod A sit<>stand)   Toilet Transfer: Moderate assistance;Ambulation;RW;+2 for safety/equipment Toilet Transfer Details (indicate cue type and reason): Stand from recliner>7 steps foreward>tried to back up but unsuccessful>so brought recliner up behind her Toileting- Water quality scientist and Hygiene: Total assistance (with Mod A sit<>stand)               Vision Additional Comments: no change from baseline          Pertinent Vitals/Pain Pain Assessment: No/denies pain     Hand Dominance Right   Extremity/Trunk Assessment Upper Extremity Assessment Upper Extremity Assessment: Generalized weakness   Lower Extremity Assessment Lower Extremity Assessment: Generalized weakness       Communication Communication Communication: No difficulties   Cognition Arousal/Alertness: Lethargic Behavior During Therapy: WFL for tasks assessed/performed Overall Cognitive Status: History of cognitive impairments - at baseline                                Home Living Family/patient expects to be discharged to:: Skilled nursing facility Living Arrangements: Children Available Help at Discharge: Family;Available PRN/intermittently Type of Home: House       Home Layout: One level  Home Equipment: Walker - 2 wheels   Additional Comments: resides at daughter's home      Prior Functioning/Environment Level of Independence: Independent with assistive device(s)        Comments: uses depends at night    OT Diagnosis: Generalized weakness;Cognitive deficits   OT Problem List: Decreased strength;Impaired balance (sitting  and/or standing);Decreased cognition;Decreased knowledge of use of DME or AE   OT Treatment/Interventions: Self-care/ADL training;Patient/family education;Balance training;DME and/or AE instruction;Therapeutic activities    OT Goals(Current goals can be found in the care plan section) Acute Rehab OT Goals Patient Stated Goal: to get better OT Goal Formulation: With patient Time For Goal Achievement: 03/13/15 Potential to Achieve Goals: Good  OT Frequency: Min 2X/week   Barriers to D/C: Decreased caregiver support             End of Session Equipment Utilized During Treatment: Gait belt;Rolling walker;Oxygen (2 liters)  Activity Tolerance: Patient limited by lethargy Patient left: in chair;with call bell/phone within reach;with family/visitor present   Time: 1209-1235 OT Time Calculation (min): 26 min Charges:  OT General Charges $OT Visit: 1 Procedure OT Evaluation $Initial OT Evaluation Tier I: 1 Procedure OT Treatments $Self Care/Home Management : 8-22 mins  Almon Register [phone] 03/06/2015, 2:09 PM

## 2015-03-06 NOTE — Progress Notes (Signed)
Pt's Hs CBG was 66. Pt alert and oriented to baseline. Snack administered. Rechecked cbg and was 115. Hypoglycemia resolved and MD notified.

## 2015-03-06 NOTE — Progress Notes (Signed)
Swallow evaluation order received, will be completed next date.  Thanks for this consult. Luanna Salk, [facility] SLP [phone]

## 2015-03-06 NOTE — Progress Notes (Signed)
[facility] PROGRESS NOTE  Briana Thornton YQM:[id number] DOB: 02/09/1927 DOA: 03/05/2015 PCP: Penni Homans, MD  Assessment/Plan: 1-SOB and hypoxia due to CAP (community acquired pneumonia): CURB 65 is 2; patient is frail and with hypoxia on RA -will continue tx with rocephin and zithromax -will follow blood cx's, urine cx;s and legionella/strep antigen and influenza by PCR -follow sputum cx's -continue PRN nebulizer treatment, oxygen supplementation and mucinex -continue supportive care and follow clinical response   2-Hypertension: stable and well controlled -will monitor VS -currently stable and at home not taking any antihypertensive regimen  3-Hyperlipidemia: will continue statins   4-DM (diabetes mellitus) ([place]): well controlled -will hold oral hypoglycemic regimen while inpatient  -continue SSI  5-Pacemaker-St.Jude: stable and recently evaluated according to daughter reports  6-Dementia without behavioral disturbance: will continue aricept and supportive care  7-Dysphagia:  -will follow rec's from [facility]  8-Weakness:  -PT/OT ordered -will follow rec's -up with assistance  9-Dehydration, mild:  -resolved after IVF's resuscitation -will change IVF rate to maintenance -patient eating and drinking better   Code Status: DNR Family Communication: daughter at bedside  Disposition Plan: remains inpatient, will continue antibiotics, supportive care and follow rec's from PT/OT   Consultants:  None   Procedures:  See below for x-ray reports   Antibiotics:  Rocephin 10/08  zithromax 10/08  HPI/Subjective: Feeling somewhat better; still with some SOB, but improving. Denies CP. Patient still requiring O2 supplementation, mildly tachypeneic and experienced high grade fever overnight   Objective: Filed Vitals:   03/06/15 0819  BP:   Pulse:   Temp: 99.5 F (37.5 C)  Resp:     Intake/Output Summary (Last 24 hours) at 03/06/15 1312 Last data filed at  03/06/15 0000  Gross per 24 hour  Intake    450 ml  Output      0 ml  Net    450 ml   There were no vitals filed for this visit.  Exam:   General:  Patient with high grade temp overnight, no CP and report that her breathing is somewhat better. Per family she if more perky. No nausea, no vomiting and with improvement on her appetite   Cardiovascular: S1 and S2, no rubs or gallops  Respiratory: positive rhonchi and decrease air movement (Left > right); patient also with need of oxygen supplementation to maintain O2 sat above 90%  Abdomen: soft, NT, ND, positive BS  Musculoskeletal: no edema, no cyanosis or clubbing  Data Reviewed: Basic Metabolic Panel:  Recent Labs Lab 03/05/15 1321 03/05/15 1857 03/06/15 0410  NA 142  --  142  K 3.7  --  3.5  CL 109  --  111  CO2 26  --  24  GLUCOSE 125*  --  110*  BUN 23*  --  21*  CREATININE 1.04* 0.90 1.00  CALCIUM 9.4  --  8.6*  MG  --  1.8  --   PHOS  --  2.6  --    Liver Function Tests:  Recent Labs Lab 03/05/15 1321  AST 19  ALT 10*  ALKPHOS 55  BILITOT 1.6*  PROT 6.7  ALBUMIN 3.8   CBC:  Recent Labs Lab 03/05/15 1321 03/05/15 1857 03/06/15 0410  WBC 9.8 9.5 7.7  NEUTROABS 8.2*  --   --   HGB 12.5 10.8* 10.9*  HCT 39.3 33.7* 33.8*  MCV 90.8 90.8 91.4  PLT 207 194 175   CBG:  Recent Labs Lab 03/05/15 1746 03/05/15 2115 03/06/15 0733 03/06/15 1153  GLUCAP 98 123* 89 191*    Recent Results (from the past 240 hour(s))  Culture, blood (single)     Status: None (Preliminary result)   Collection Time: 03/05/15  1:22 PM  Result Value Ref Range Status   Specimen Description LEFT ANTECUBITAL  Final   Special Requests BOTTLES DRAWN AEROBIC AND ANAEROBIC 5CC  Final   Culture   Final    NO GROWTH < 24 HOURS Performed at [facility]    Report Status PENDING  Incomplete  Culture, blood (routine x 2) Call MD if unable to obtain prior to antibiotics being given     Status: None (Preliminary result)    Collection Time: 03/05/15  1:37 PM  Result Value Ref Range Status   Specimen Description BLOOD LEFT HAND  Final   Special Requests IN PEDIATRIC BOTTLE 2CC  Final   Culture   Final    NO GROWTH < 12 HOURS Performed at [facility]    Report Status PENDING  Incomplete  Culture, sputum-assessment     Status: None   Collection Time: 03/06/15 11:30 AM  Result Value Ref Range Status   Specimen Description SPUTUM  Final   Special Requests NONE  Final   Sputum evaluation   Final    THIS SPECIMEN IS ACCEPTABLE. RESPIRATORY CULTURE REPORT TO FOLLOW.   Report Status 03/06/2015 FINAL  Final     Studies: Dg Chest 2 View  03/06/2015   CLINICAL DATA:  Shortness of breath, productive cough, generalized body weakness.  EXAM: CHEST  2 VIEW  COMPARISON:  03/05/2015  FINDINGS: Dual lead cardiac pacemaker is stable.  Cardiomediastinal silhouette is persistently enlarged. Mediastinal contours appear intact.  There is no evidence of pneumothorax. There is a left pleural effusion with left lower lobe airspace consolidation versus atelectasis. There may be a small right pleural effusion.  Osseous structures are without acute abnormality. Soft tissues are grossly normal.  IMPRESSION: Left pleural effusion with left lower lobe airspace consolidation versus atelectasis.  Probable small right pleural effusion.  Stably enlarged cardiac silhouette.   Electronically Signed   By: Fidela Salisbury M.D.   On: 03/06/2015 09:10   Ct Head Wo Contrast  03/05/2015   CLINICAL DATA:  Patient with weakness. Shortness of breath. Lethargy.  EXAM: CT HEAD WITHOUT CONTRAST  TECHNIQUE: Contiguous axial images were obtained from the base of the skull through the vertex without intravenous contrast.  COMPARISON:  Brain CT 08/21/2013  FINDINGS: Ventricles and sulci are prominent compatible with atrophy. Extensive periventricular and subcortical white matter hypodensity compatible with chronic small vessel ischemic changes. Old  bilateral basal ganglia lacunar infarcts. No evidence for acute cortically based infarct, intracranial hemorrhage or mass effect. Unchanged 18 mm ossified mass within the subfrontal region on the right adjacent to the falx. Orbits are unremarkable. Paranasal sinuses are well aerated. Mastoid air cells are unremarkable. Calvarium is intact.  IMPRESSION: No acute intracranial process.  Chronic small vessel ischemic changes cortical atrophy.  Stable probable calcified meningioma within the frontal lobe adjacent to the falx.   Electronically Signed   By: Lovey Newcomer M.D.   On: 03/05/2015 14:38   Dg Chest Port 1 View  03/05/2015   CLINICAL DATA:  Nursing home patient with 24 hour duration weakness and low oxygen saturation.  EXAM: PORTABLE CHEST 1 VIEW  COMPARISON:  07/24/2013  FINDINGS: Dual lead pacemaker appears unchanged. There is chronic cardiomegaly with left ventricular prominence. There is newly seen venous hypertension with early interstitial edema.  Chronic mitral valve calcification and calcified left hilar nodes. No effusions. Old traumatic changes in the region of the left shoulder.  IMPRESSION: Cardiomegaly with left ventricular prominence. Venous hypertension with early interstitial edema.   Electronically Signed   By: Nelson Chimes M.D.   On: 03/05/2015 13:49    Scheduled Meds: . acidophilus  1 capsule Oral Daily  . atorvastatin  10 mg Oral q1800  . azithromycin  500 mg Intravenous Q24H  . cefTRIAXone (ROCEPHIN)  IV  1 g Intravenous Q24H  . donepezil  10 mg Oral QHS  . guaiFENesin  600 mg Oral BID  . heparin  5,000 Units Subcutaneous 3 times per day  . insulin aspart  0-9 Units Subcutaneous TID WC  . multivitamin with minerals  2 tablet Oral Daily   Continuous Infusions: . sodium chloride 50 mL/hr at 03/06/15 1208    Principal Problem:   CAP (community acquired pneumonia) Active Problems:   Hypertension   Hyperlipidemia   DM (diabetes mellitus) ([place])   Pacemaker-St.Jude    Dementia without behavioral disturbance   Dysphagia   SOB (shortness of breath)   Hypoxia   Weakness   Dehydration, mild   Controlled type 2 diabetes mellitus with diabetic nephropathy, without long-term current use of insulin ([place])    Time spent: 30 minutes     Barton Dubois  [facility] Pager [phone]. If 7PM-7AM, please contact night-coverage at [email or web address], password [facility] 03/06/2015, 1:12 PM  LOS: 1 day

## 2015-03-06 NOTE — Evaluation (Signed)
Physical Therapy Evaluation Patient Details Name: Briana Thornton MRN: [id number] DOB: 21-May-1927 Today's Date: 03/06/2015   History of Present Illness  Briana Thornton is a 81 y.o. female with PMH significant for HTN, HLD, Diabetes mellitus (not on insulin) and hx of dementia w/o behavioral disturbances; who presented to ED 03/05/15  secondary to generalized weakness, low grade fever and SOB. Patient reported sx's has been present for the last 2 days prior to admission and steadily worsening. There has been some associated decrease in capacity of performing ADL's and some low grade temp treated at home with tylenol Chest xray reveals L>R pleural effusions.  Clinical Impression  Patient is very weak, requires significant assist for transfers. Daughter present for  Evaluation. Patient will benefit from PT to address problems listed in note below. Sats > 93% on 2 liters.    Follow Up Recommendations Home health PT;SNF (vs. will see how pt. Progresses and caregiver availability.)    Equipment Recommendations  None recommended by PT    Recommendations for Other Services       Precautions / Restrictions Precautions Precautions: Fall Precaution Comments: droplet      Mobility  Bed Mobility Overal bed mobility: Needs Assistance Bed Mobility: Supine to Sit     Supine to sit: Max assist     General bed mobility comments: assist with legs over th edge, assist with trunk upright.  Transfers Overall transfer level: Needs assistance Equipment used: Rolling walker (2 wheeled) Transfers: Sit to/from [facility] Sit to Stand: Max assist;+2 safety/equipment Stand pivot transfers: Max assist;+2 safety/equipment       General transfer comment: attempted stand from bed with RW, patient was retro pulsive, feet sliding forward. resorted  to a "bear Hug" stand and pivot to [facility]  then again "bear hug" stand and Recliner brought up.  Ambulation/Gait                Stairs            Wheelchair Mobility    Modified Rankin (Stroke Patients Only)       Balance Overall balance assessment: Needs assistance;History of Falls Sitting-balance support: Feet supported;Bilateral upper extremity supported Sitting balance-[name]: Poor     Standing balance support: During functional activity Standing balance-[name]: Poor                               Pertinent Vitals/Pain Pain Assessment: No/denies pain    Home Living Family/patient expects to be discharged to:: Private residence   Available Help at Discharge: Family;Available PRN/intermittently Type of Home: House       Home Layout: One level Home Equipment: Walker - 2 wheels Additional Comments: resides at daughter's home    Prior Function Level of Independence: Independent with assistive device(s)         Comments: uses depends at night     Hand Dominance        Extremity/Trunk Assessment   Upper Extremity Assessment: Generalized weakness           Lower Extremity Assessment: Generalized weakness      Cervical / Trunk Assessment: Kyphotic  Communication   Communication: No difficulties  Cognition Arousal/Alertness: Awake/alert Behavior During Therapy: WFL for tasks assessed/performed Overall Cognitive Status: History of cognitive impairments - at baseline                      General Comments  Exercises        Assessment/Plan    PT Assessment Patient needs continued PT services  PT Diagnosis Difficulty walking;Generalized weakness   PT Problem List Decreased strength;Decreased activity tolerance;Decreased balance;Decreased mobility;Decreased cognition;Decreased coordination;Cardiopulmonary status limiting activity;Decreased knowledge of use of DME;Decreased safety awareness;Decreased knowledge of precautions  PT Treatment Interventions DME instruction;Gait training;Functional mobility training;Therapeutic activities;Therapeutic  exercise;Patient/family education   PT Goals (Current goals can be found in the Care Plan section) Acute Rehab PT Goals Patient Stated Goal: to get better, cough up stuff. PT Goal Formulation: With patient/family Time For Goal Achievement: 03/20/15 Potential to Achieve Goals: Good    Frequency Min 3X/week   Barriers to discharge        Co-evaluation               End of Session   Activity Tolerance: Patient limited by fatigue Patient left: in chair;with call bell/phone within reach;with chair alarm set;with family/visitor present Nurse Communication: Mobility status         Time: 6160-7371 PT Time Calculation (min) (ACUTE ONLY): 17 min   Charges:   PT Evaluation $Initial PT Evaluation Tier I: 1 Procedure     PT G CodesClaretha Cooper 03/06/2015, 9:36 AM Tresa Endo PT [phone]

## 2015-03-07 LAB — HEMOGLOBIN A1C
Hgb A1c MFr Bld: 6 % — ABNORMAL HIGH (ref 4.8–5.6)
MEAN PLASMA GLUCOSE: 126 mg/dL

## 2015-03-07 LAB — CBC
HEMATOCRIT: 33.7 % — AB (ref 36.0–46.0)
Hemoglobin: 10.7 g/dL — ABNORMAL LOW (ref 12.0–15.0)
MCH: 29 pg (ref 26.0–34.0)
MCHC: 31.8 g/dL (ref 30.0–36.0)
MCV: 91.3 fL (ref 78.0–100.0)
PLATELETS: 192 10*3/uL (ref 150–400)
RBC: 3.69 MIL/uL — ABNORMAL LOW (ref 3.87–5.11)
RDW: 14.4 % (ref 11.5–15.5)
WBC: 7.6 10*3/uL (ref 4.0–10.5)

## 2015-03-07 LAB — LEGIONELLA PNEUMOPHILA SEROGP 1 UR AG: L. PNEUMOPHILA SEROGP 1 UR AG: NEGATIVE

## 2015-03-07 LAB — URINE CULTURE: Culture: NO GROWTH

## 2015-03-07 LAB — BASIC METABOLIC PANEL
Anion gap: 5 (ref 5–15)
BUN: 22 mg/dL — AB (ref 6–20)
CALCIUM: 8.6 mg/dL — AB (ref 8.9–10.3)
CO2: 24 mmol/L (ref 22–32)
CREATININE: 0.8 mg/dL (ref 0.44–1.00)
Chloride: 113 mmol/L — ABNORMAL HIGH (ref 101–111)
GFR calc Af Amer: >60 mL/min (ref >60)
GLUCOSE: 106 mg/dL — AB (ref 65–99)
Potassium: 3.9 mmol/L (ref 3.5–5.1)
Sodium: 142 mmol/L (ref 135–145)

## 2015-03-07 LAB — GLUCOSE, CAPILLARY
GLUCOSE-CAPILLARY: 109 mg/dL — AB (ref 65–99)
Glucose-Capillary: 108 mg/dL — ABNORMAL HIGH (ref 65–99)
Glucose-Capillary: 207 mg/dL — ABNORMAL HIGH (ref 65–99)
Glucose-Capillary: 71 mg/dL (ref 65–99)

## 2015-03-07 NOTE — Progress Notes (Signed)
[facility] PROGRESS NOTE  Briana Thornton OZD:[id number] DOB: [birth year] DOA: 03/05/2015 PCP: Penni Homans, MD  Assessment/Plan: 1-SOB and hypoxia due to CAP (community acquired pneumonia): CURB 65 is 2; patient is frail and with hypoxia on RA -will continue tx with rocephin and zithromax -will follow blood cx's, urine cx;s and legionella/strep antigen -neg influenza by PCR -follow sputum cx's -continue PRN nebulizer treatment, oxygen supplementation and mucinex -continue supportive care and follow clinical response  -slowly improving; will try weaning oxygen  2-Hypertension: stable and well controlled -will monitor VS -fairly stable, to slightly elevated.  -no meds at home -will monitor and use PRN antihypertensive agents (for SBP >185 and/or DBP >110)  3-Hyperlipidemia: will continue statins   4-DM (diabetes mellitus) ([place]): well controlled -will hold oral hypoglycemic regimen while inpatient  -continue SSI  5-Pacemaker-St.Jude: stable and recently evaluated according to daughter reports  6-Dementia without behavioral disturbance: will continue aricept and supportive care  7-Dysphagia:  -will follow rec's from [facility] (currently doing fairly well and ok to follow regular diet with thin liquids)  8-Weakness:  -PT/OT ordered; recommending SNF -will follow rec's, family in agreement   9-Dehydration, mild:  -resolved after IVF's resuscitation -will change IVF rate to maintenance -patient eating and drinking better   Code Status: DNR Family Communication: daughter at bedside  Disposition Plan: remains inpatient, will continue antibiotics, supportive care and follow rec's from PT/OT; family in agreement with ST SNF   Consultants:  None   Procedures:  See below for x-ray reports   Antibiotics:  Rocephin 10/08  zithromax 10/08  HPI/Subjective: Feeling somewhat better; still with some SOB, but improving. Denies CP. Patient still requiring O2 supplementation,  mildly tachypeneic and experienced high grade fever overnight   Objective: Filed Vitals:   03/07/15 1342  BP: 163/50  Pulse: 68  Temp: 98.3 F (36.8 C)  Resp: 18    Intake/Output Summary (Last 24 hours) at 03/07/15 1452 Last data filed at 03/06/15 1800  Gross per 24 hour  Intake 293.33 ml  Output      0 ml  Net 293.33 ml   There were no vitals filed for this visit.  Exam:   General:  Patient afebrile now, feeling somewhat better and breathing a little easier. Positive couching spells. No CP and denies nausea/vomiting and with improvement on her appetite.   Cardiovascular: S1 and S2, no rubs or gallops  Respiratory: positive rhonchi and decrease air movement (Left > right); patient also with need of oxygen supplementation to maintain O2 sat above 90%  Abdomen: soft, NT, ND, positive BS  Musculoskeletal: no edema, no cyanosis or clubbing  Data Reviewed: Basic Metabolic Panel:  Recent Labs Lab 03/05/15 1321 03/05/15 1857 03/06/15 0410 03/07/15 0425  NA 142  --  142 142  K 3.7  --  3.5 3.9  CL 109  --  111 113*  CO2 26  --  24 24  GLUCOSE 125*  --  110* 106*  BUN 23*  --  21* 22*  CREATININE 1.04* 0.90 1.00 0.80  CALCIUM 9.4  --  8.6* 8.6*  MG  --  1.8  --   --   PHOS  --  2.6  --   --    Liver Function Tests:  Recent Labs Lab 03/05/15 1321  AST 19  ALT 10*  ALKPHOS 55  BILITOT 1.6*  PROT 6.7  ALBUMIN 3.8   CBC:  Recent Labs Lab 03/05/15 1321 03/05/15 1857 03/06/15 0410 03/07/15 0425  WBC 9.8 9.5  7.7 7.6  NEUTROABS 8.2*  --   --   --   HGB 12.5 10.8* 10.9* 10.7*  HCT 39.3 33.7* 33.8* 33.7*  MCV 90.8 90.8 91.4 91.3  PLT 207 194 175 192   CBG:  Recent Labs Lab 03/06/15 1655 03/06/15 2102 03/06/15 2137 03/07/15 0848 03/07/15 1143  GLUCAP 244* 66 115* 108* 109*    Recent Results (from the past 240 hour(s))  Culture, blood (single)     Status: None (Preliminary result)   Collection Time: 03/05/15  1:22 PM  Result Value Ref Range  Status   Specimen Description LEFT ANTECUBITAL  Final   Special Requests BOTTLES DRAWN AEROBIC AND ANAEROBIC 5CC  Final   Culture   Final    NO GROWTH 2 DAYS Performed at [facility]    Report Status PENDING  Incomplete  Culture, blood (routine x 2) Call MD if unable to obtain prior to antibiotics being given     Status: None (Preliminary result)   Collection Time: 03/05/15  1:37 PM  Result Value Ref Range Status   Specimen Description BLOOD LEFT HAND  Final   Special Requests IN PEDIATRIC BOTTLE [place]  Final   Culture   Final    NO GROWTH 2 DAYS Performed at [facility]    Report Status PENDING  Incomplete  Urine culture     Status: None   Collection Time: 03/06/15  2:59 AM  Result Value Ref Range Status   Specimen Description URINE, CATHETERIZED  Final   Special Requests NONE  Final   Culture   Final    NO GROWTH 1 DAY Performed at [facility]    Report Status 03/07/2015 FINAL  Final  Culture, sputum-assessment     Status: None   Collection Time: 03/06/15 11:30 AM  Result Value Ref Range Status   Specimen Description SPUTUM  Final   Special Requests NONE  Final   Sputum evaluation   Final    THIS SPECIMEN IS ACCEPTABLE. RESPIRATORY CULTURE REPORT TO FOLLOW.   Report Status 03/06/2015 FINAL  Final  Culture, respiratory (NON-Expectorated)     Status: None (Preliminary result)   Collection Time: 03/06/15 11:30 AM  Result Value Ref Range Status   Specimen Description SPUTUM  Final   Special Requests NONE  Final   Gram Stain   Final    FEW WBC PRESENT, PREDOMINANTLY PMN RARE SQUAMOUS EPITHELIAL CELLS PRESENT NO ORGANISMS SEEN Performed at [facility]    Culture NO GROWTH Performed at [facility]   Final   Report Status PENDING  Incomplete     Studies: Dg Chest 2 View  03/06/2015   CLINICAL DATA:  Shortness of breath, productive cough, generalized body weakness.  EXAM: CHEST  2 VIEW  COMPARISON:  03/05/2015  FINDINGS: Dual  lead cardiac pacemaker is stable.  Cardiomediastinal silhouette is persistently enlarged. Mediastinal contours appear intact.  There is no evidence of pneumothorax. There is a left pleural effusion with left lower lobe airspace consolidation versus atelectasis. There may be a small right pleural effusion.  Osseous structures are without acute abnormality. Soft tissues are grossly normal.  IMPRESSION: Left pleural effusion with left lower lobe airspace consolidation versus atelectasis.  Probable small right pleural effusion.  Stably enlarged cardiac silhouette.   Electronically Signed   By: Fidela Salisbury M.D.   On: 03/06/2015 09:10    Scheduled Meds: . acidophilus  1 capsule Oral Daily  . atorvastatin  10 mg Oral q1800  .  azithromycin  500 mg Intravenous Q24H  . cefTRIAXone (ROCEPHIN)  IV  1 g Intravenous Q24H  . donepezil  10 mg Oral QHS  . guaiFENesin  600 mg Oral BID  . heparin  5,000 Units Subcutaneous 3 times per day  . insulin aspart  0-9 Units Subcutaneous TID WC  . multivitamin with minerals  2 tablet Oral Daily   Continuous Infusions: . sodium chloride 50 mL/hr at 03/07/15 0618    Principal Problem:   CAP (community acquired pneumonia) Active Problems:   Hypertension   Hyperlipidemia   DM (diabetes mellitus) ([place])   Pacemaker-St.Jude   Dementia without behavioral disturbance   Dysphagia   SOB (shortness of breath)   Hypoxia   Weakness   Dehydration, mild   Controlled type 2 diabetes mellitus with diabetic nephropathy, without long-term current use of insulin ([place])    Time spent: 30 minutes     Barton Dubois  [facility] Pager [phone]. If 7PM-7AM, please contact night-coverage at [email or web address], password [facility] 03/07/2015, 2:52 PM  LOS: 2 days

## 2015-03-07 NOTE — Care Management Important Message (Signed)
Important Message  Patient Details  Name: Briana Thornton MRN: [id number] Date of Birth: 02-May-1927   Medicare Important Message Given:  Yes-second notification given    [name] 03/07/2015, 1:15 PMImportant Message  Patient Details  Name: Briana Thornton MRN: [id number] Date of Birth: 10/19/1926   Medicare Important Message Given:  Yes-second notification given    [name] 03/07/2015, 1:14 PM

## 2015-03-07 NOTE — Progress Notes (Signed)
Order placed to monitor o2 while patient is ambulating- patient stated to RN that she no longer walks and has not walked in a long time. RN read PT notes which state that the patient has difficulty standing and needs more PT due to difficulty walking. RN will not take patient out of the bed at this time but instead take oxygen level with o2 on and then off.     Patient laying with o2= 96 Patient laying without o2 for 2 minutes=93  Patient is stable and awake

## 2015-03-07 NOTE — Progress Notes (Signed)
CSW continuing to follow.   CSW followed up with pt daughter, [name] regarding SNF bed offers.   CSW provided SNF bed offers and pt daughter chooses [facility]. Per pt daughter, pt has been to [facility] in the past and pt and pt daughter were pleased with the care pt received at [facility].   CSW contacted [facility] and notified facility of pt acceptance of bed offer. [facility] confirmed that facility can accept pt when pt medically ready for discharge.  Per MD, pt not yet medically ready for discharge.  CSW to continue to follow to provide support and assist with pt discharge to [facility] when pt medically ready for discharge.  Alison Murray, MSW, [place] Work [phone]

## 2015-03-07 NOTE — Evaluation (Signed)
Clinical/Bedside Swallow Evaluation Patient Details  Name: Briana Thornton MRN: [id number] Date of Birth: 03/07/1927  Today's Date: 03/07/2015 Time: SLP Start Time (ACUTE ONLY): 8242 SLP Stop Time (ACUTE ONLY): 0900 SLP Time Calculation (min) (ACUTE ONLY): 23 min  Past Medical History:  Past Medical History  Diagnosis Date  . Chicken pox as a child  . Measles as a child  . IBS (irritable bowel syndrome)   . Diabetes mellitus 47    type 2  . Hyperlipidemia 72  . Hypertension 72  . Vision loss of right eye   . Vertigo 2010    benign  . Calcification of cartilage     ear  . Cardiovascular system disease 07/02/2011  . Hemorrhoid 07/02/2011  . Osteoporosis 07/02/2011  . Weight loss 07/31/2011  . Anemia 05/22/2012  . Broken wrist 10-22-11    left  . Depression 05/22/2012  . Dermatitis 05/22/2012    Right neck   Past Surgical History:  Past Surgical History  Procedure Laterality Date  . Hemorroidectomy  1979  . Knee scoped  2002    right  . Rotator cuff repair  2004    right  . Abdominal hysterectomy  2008    partial still has ovaries  . Lens implant left  2006  . Pacemaker insertion  2010  . Wrist surgery  11-05-11    left wrist  . Intramedullary (im) nail intertrochanteric Right 07/24/2013    Procedure: INTRAMEDULLARY (IM) NAIL INTERTROCHANTRIC;  Surgeon: Gearlean Alf, MD;  Location: [facility];  Service: Orthopedics;  Laterality: Right;   HPI:  Briana Thornton is a 81 y.o. female with PMH significant for dementia, HTN, HLD, Diabetes mellitus (not on insulin) and hx of dementia w/o behavioral disturbances; who presented to ED 03/05/15 secondary to generalized weakness, low grade fever and SOB. Dx with CAP. PEr MD, daughter with concerns for aspiration. Previous MBS 01/2014: "Pt presents with a mild pharyngeal dysphagia, marked by a mild delay in initiation and mildly reduced hyolaryngeal movement, which results in residuals primarily at the UES with solids and thin liquids." No  penetration/aspiration observed at that time.     Assessment / Plan / Recommendation Clinical Impression  Bedside swallow evaluation complete. Swallowing abilities appear consistent with results from previous MBS in which patient with consistent, independent dry swallow per bite/sip to clear known pharyngeal residuals. Overall patient appears to be protecting airway without overt indication of aspiration although baseline congested cough makes diagnostics at bedside challenging. Discussed with patient and daughter. Other than due to deconditioning with acute illness, doubtful for much change in overall swallowing function from previous instrumental study in 2015. Recommend continuation of current diet with compensatory strategies to decrease risk of aspiration. As overall medical condition improves, will follow at bedside for need for repeat instrumental exam.     Aspiration Risk  Moderate    Diet Recommendation Age appropriate regular solids;Thin   Medication Administration: Whole meds with liquid (one at a time) Compensations: Slow rate;Small sips/bites;Multiple dry swallows after each bite/sip    Other  Recommendations Oral Care Recommendations: Oral care BID      Frequency and Duration min 2x/week  1 week           Swallow Study    General Other Pertinent Information: Briana Thornton is a 81 y.o. female with PMH significant for dementia, HTN, HLD, Diabetes mellitus (not on insulin) and hx of dementia w/o behavioral disturbances; who presented to ED 03/05/15 secondary to generalized weakness, low grade fever  and SOB. Dx with CAP. PEr MD, daughter with concerns for aspiration. Previous MBS 01/2014: "Pt presents with a mild pharyngeal dysphagia, marked by a mild delay in initiation and mildly reduced hyolaryngeal movement, which results in residuals primarily at the UES with solids and thin liquids." No penetration/aspiration observed at that time.   Type of Study: Bedside swallow  evaluation Previous Swallow Assessment: see HPI Diet Prior to this Study: Regular;Thin liquids Temperature Spikes Noted: Yes Respiratory Status: Supplemental O2 delivered via (comment) (nasal cannula) History of Recent Intubation: No Behavior/Cognition: Alert;Cooperative;Pleasant mood Oral Cavity - Dentition: Adequate natural dentition/normal for age Self-Feeding Abilities: Able to feed self;Needs assist (due to generalized weakness) Patient Positioning: Upright in bed Baseline Vocal Quality: Normal Volitional Cough: Strong;Congested Volitional Swallow: Able to elicit    Oral/Motor/Sensory Function Overall Oral Motor/Sensory Function: Appears within functional limits for tasks assessed   Ice Chips Ice chips: Not tested   Thin Liquid Thin Liquid: Impaired Presentation: Cup;Self Fed;Straw Pharyngeal  Phase Impairments: Multiple swallows    Nectar Thick Nectar Thick Liquid: Not tested   Honey Thick Honey Thick Liquid: Not tested   Puree Puree: Impaired Presentation: Spoon Pharyngeal Phase Impairments: Multiple swallows   Solid   GO   Analysse Quinonez MA, CCC-SLP [phone]  Solid: Impaired Pharyngeal Phase Impairments: Multiple swallows       Johngabriel Verde Meryl 03/07/2015,9:08 AM

## 2015-03-07 NOTE — Clinical Social Work Placement (Signed)
   CLINICAL SOCIAL WORK PLACEMENT  NOTE  Date:  03/07/2015  Patient Details  Name: Briana Thornton MRN: [id number] Date of Birth: 28-Jul-1926  Clinical Social Work is seeking post-discharge placement for this patient at the [place] level of care (*CSW will initial, date and re-position this form in  chart as items are completed):  Yes   Patient/family provided with [place] Work Department's list of facilities offering this level of care within the geographic area requested by the patient (or if unable, by the patient's family).  Yes   Patient/family informed of their freedom to choose among providers that offer the needed level of care, that participate in Medicare, Medicaid or managed care program needed by the patient, have an available bed and are willing to accept the patient.  Yes   Patient/family informed of [place]'s ownership interest in [facility] and [facility], as well as of the fact that they are under no obligation to receive care at these facilities.  PASRR submitted to EDS on       PASRR number received on       Existing PASRR number confirmed on 03/06/15     FL2 transmitted to all facilities in geographic area requested by pt/family on 03/06/15     FL2 transmitted to all facilities within larger geographic area on       Patient informed that his/her managed care company has contracts with or will negotiate with certain facilities, including the following:        Yes   Patient/family informed of bed offers received.  Patient chooses bed at [facility]     Physician recommends and patient chooses bed at      Patient to be transferred to   on  .  Patient to be transferred to facility by       Patient family notified on   of transfer.  Name of family member notified:        PHYSICIAN Please sign FL2, Please sign DNR     Additional Comment:    _______________________________________________ Ladell Pier, LCSW 03/07/2015, 11:27 AM

## 2015-03-08 DIAGNOSIS — E785 Hyperlipidemia, unspecified: Secondary | ICD-10-CM | POA: Diagnosis not present

## 2015-03-08 DIAGNOSIS — D649 Anemia, unspecified: Secondary | ICD-10-CM | POA: Diagnosis not present

## 2015-03-08 DIAGNOSIS — J189 Pneumonia, unspecified organism: Secondary | ICD-10-CM | POA: Diagnosis not present

## 2015-03-08 DIAGNOSIS — K5900 Constipation, unspecified: Secondary | ICD-10-CM | POA: Diagnosis not present

## 2015-03-08 DIAGNOSIS — I10 Essential (primary) hypertension: Secondary | ICD-10-CM | POA: Diagnosis not present

## 2015-03-08 DIAGNOSIS — E079 Disorder of thyroid, unspecified: Secondary | ICD-10-CM | POA: Diagnosis not present

## 2015-03-08 DIAGNOSIS — R1313 Dysphagia, pharyngeal phase: Secondary | ICD-10-CM | POA: Diagnosis not present

## 2015-03-08 DIAGNOSIS — F0391 Unspecified dementia with behavioral disturbance: Secondary | ICD-10-CM | POA: Diagnosis not present

## 2015-03-08 DIAGNOSIS — F0390 Unspecified dementia without behavioral disturbance: Secondary | ICD-10-CM | POA: Diagnosis not present

## 2015-03-08 DIAGNOSIS — R278 Other lack of coordination: Secondary | ICD-10-CM | POA: Diagnosis not present

## 2015-03-08 DIAGNOSIS — E119 Type 2 diabetes mellitus without complications: Secondary | ICD-10-CM | POA: Diagnosis not present

## 2015-03-08 DIAGNOSIS — Z5189 Encounter for other specified aftercare: Secondary | ICD-10-CM | POA: Diagnosis not present

## 2015-03-08 DIAGNOSIS — R531 Weakness: Secondary | ICD-10-CM | POA: Diagnosis not present

## 2015-03-08 DIAGNOSIS — R262 Difficulty in walking, not elsewhere classified: Secondary | ICD-10-CM | POA: Diagnosis not present

## 2015-03-08 DIAGNOSIS — Z950 Presence of cardiac pacemaker: Secondary | ICD-10-CM | POA: Diagnosis not present

## 2015-03-08 DIAGNOSIS — E860 Dehydration: Secondary | ICD-10-CM | POA: Diagnosis not present

## 2015-03-08 DIAGNOSIS — R1310 Dysphagia, unspecified: Secondary | ICD-10-CM | POA: Diagnosis not present

## 2015-03-08 DIAGNOSIS — R062 Wheezing: Secondary | ICD-10-CM | POA: Diagnosis not present

## 2015-03-08 DIAGNOSIS — E1121 Type 2 diabetes mellitus with diabetic nephropathy: Secondary | ICD-10-CM | POA: Diagnosis not present

## 2015-03-08 DIAGNOSIS — M6281 Muscle weakness (generalized): Secondary | ICD-10-CM | POA: Diagnosis not present

## 2015-03-08 LAB — GLUCOSE, CAPILLARY
GLUCOSE-CAPILLARY: 164 mg/dL — AB (ref 65–99)
Glucose-Capillary: 114 mg/dL — ABNORMAL HIGH (ref 65–99)

## 2015-03-08 MED ORDER — AMOXICILLIN-POT CLAVULANATE 875-125 MG PO TABS: 1.0000 | ORAL_TABLET | Freq: Two times a day (BID) | ORAL | Status: AC

## 2015-03-08 MED ORDER — IPRATROPIUM-ALBUTEROL 20-100 MCG/ACT IN AERS: 1.0000 | INHALATION_SPRAY | Freq: Four times a day (QID) | RESPIRATORY_TRACT | Status: DC | PRN

## 2015-03-08 MED ORDER — GUAIFENESIN ER 600 MG PO TB12: 600.0000 mg | ORAL_TABLET | Freq: Two times a day (BID) | ORAL | Status: DC

## 2015-03-08 NOTE — Discharge Summary (Signed)
Physician Discharge Summary  Briana Thornton OAC:[id number] DOB: 09/27/1926 DOA: 03/05/2015  PCP: Penni Homans, MD  Admit date: 03/05/2015 Discharge date: 03/08/2015  Time spent: 35 minutes  Recommendations for Outpatient Follow-up:  1. BMET in 5 days to follow electrolytes and renal function 2. Recheck CXR in 3 weeks to follow resolution of infiltrates  Discharge Diagnoses:  Principal Problem:   CAP (community acquired pneumonia) Active Problems:   Hypertension   Hyperlipidemia   DM (diabetes mellitus) ([place])   Pacemaker-St.Jude   Dementia without behavioral disturbance   Dysphagia   SOB (shortness of breath)   Hypoxia   Weakness   Dehydration, mild   Controlled type 2 diabetes mellitus with diabetic nephropathy, without long-term current use of insulin ([place])   Discharge Condition: stable and improved. Will discharge to SNF for further care and rehabilitation.  Diet recommendation: regular diet with thin liquids  History of present illness:  81 y.o. female with PMH significant for HTN, HLD, Diabetes mellitus (not on insulin) and hx of dementia w/o behavioral disturbances; who presented to ED secondary to generalized weakness, low grade fever and SOB. Patient reported sx's has been present for the last 2 days prior to admission and steadily worsening. There has been some associated decrease in capacity of performing ADL's and some low grade temp treated at home with tylenol. Patient denies CP, hemoptysis, hematuria, abd pain, nausea/vomiting, leg swelling, HA's, dysuria, melena or any other complaints. In the ED she was found to be mildly hypoxic on RA (87-88 %); with diffuse rhonchi, neg CXR, mildly tachypneic and very deconditioned. [facility] called to admit patient for further evaluation and treatment.  Hospital Course:  1-SOB and hypoxia due to CAP (community acquired pneumonia): CURB 87 is 2; patient is frail and with hypoxia on RA -will discharge on Augmentin BID for 5 more days in  order to complete antibiotic therapy  -blood cx neg up to date -neg influenza by PCR -follow final sputum cx's -continue PRN combivent, flutter valve use and the use of mucinex -slowly improving; will recommend slow oxygen weaning at the facility   2-Hypertension:  -fairly stable, to slightly elevated.  -no meds at home -will continue low sodium/heart healthy diet   3-Hyperlipidemia: will continue statins   4-DM (diabetes mellitus) ([place]): well controlled -will continue oral hypoglycemic regimen  5-Pacemaker-St.Jude: stable and recently evaluated according to daughter reports  6-Dementia without behavioral disturbance: will continue aricept and supportive care  7-Dysphagia:  -will follow rec's from [facility] (currently doing fairly well and ok to follow regular diet with thin liquids) -needs to be in upright position  -small bites and sips -good oral hygiene    8-Weakness:  -PT/OT ordered; recommending SNF -will follow rec's, family in agreement  -patient will be discharge to [facility] for [facility]  9-Dehydration, mild:  -resolved after IVF's resuscitation -patient eating and drinking better  -advise to maintain adequate hydration  Procedures:  See below for x-ray reports   Consultations:  None   Discharge Exam: Filed Vitals:   03/08/15 0608  BP: 168/69  Pulse: 75  Temp: 97.7 F (36.5 C)  Resp:     General: Patient afebrile now, feeling somewhat better and breathing easier. Positive intermittent coughing spells. No CP and denies nausea/vomiting   Cardiovascular: S1 and S2, no rubs or gallops  Respiratory: positive rhonchi and decrease air movement (Left > right); patient also with improvement in O2 sat; but still with intermittent use of 2L of oxygen supplementation to maintain O2 sat above 90% (on  exertion)  Abdomen: soft, NT, ND, positive BS  Musculoskeletal: no edema, no cyanosis or clubbing   Discharge Instructions   Discharge Instructions     Discharge instructions    Complete by:  As directed   Maintain adequate hydration Take medications as prescribed Physical rehabilitation as per facility protocol  Please stop Augmentin after 5 more days of antibiotics treatment Patient discharge on 2L O2 sat; will need slow weaning process as tolerated  Arrange follow up with PCP in 10 days after discharge from SNF          Current Discharge Medication List    START taking these medications   Details  amoxicillin-clavulanate (AUGMENTIN) 875-125 MG tablet Take 1 tablet by mouth 2 (two) times daily.    guaiFENesin (MUCINEX) 600 MG 12 hr tablet Take 1 tablet (600 mg total) by mouth 2 (two) times daily.    Ipratropium-Albuterol (COMBIVENT RESPIMAT) 20-100 MCG/ACT AERS respimat Inhale 1 puff into the lungs every 6 (six) hours as needed for wheezing or shortness of breath.      CONTINUE these medications which have NOT CHANGED   Details  acetaminophen (TYLENOL) 325 MG tablet Take 2 tablets (650 mg total) by mouth every 6 (six) hours as needed for mild pain (or Fever >/= 101). Qty: 60 tablet, Refills: 0    alum & mag hydroxide-simeth (MAALOX/MYLANTA) 683-419-62 MG/5ML suspension Take 30 mLs by mouth every 6 (six) hours as needed for indigestion or heartburn (dyspepsia). Qty: 355 mL, Refills: 0    atorvastatin (LIPITOR) 10 MG tablet TAKE 1 TABLET (10 MG TOTAL) BY MOUTH DAILY. Qty: 90 tablet, Refills: 3    bisacodyl (DULCOLAX) 10 MG suppository Place 1 suppository (10 mg total) rectally daily as needed for moderate constipation. Qty: 12 suppository, Refills: 0    docusate sodium (COLACE) 100 MG capsule Take 100 mg by mouth 2 (two) times daily as needed for mild constipation.    donepezil (ARICEPT) 10 MG tablet Take 1 tablet (10 mg total) by mouth at bedtime. Qty: 90 tablet, Refills: 3    metFORMIN (GLUCOPHAGE-XR) 500 MG 24 hr tablet TAKE 1 TABLET (500 MG TOTAL) BY MOUTH DAILY WITH BREAKFAST. Qty: 90 tablet, Refills: 3     methylcellulose (ARTIFICIAL TEARS) 1 % ophthalmic solution Place 1 drop into both eyes as needed (dry eyes).     Multiple Vitamin (MULTIVITAMIN WITH MINERALS) TABS tablet Take 2 tablets by mouth daily.    polyethylene glycol (MIRALAX / GLYCOLAX) packet Take 17 g by mouth daily as needed for mild constipation. Qty: 14 each, Refills: 0    Probiotic Product (PROBIOTIC DAILY PO) Take 1 tablet by mouth daily.       No Known Allergies Follow-up Information    Schedule an appointment as soon as possible for a visit with Penni Homans, MD.   Specialty:  Family Medicine   Why:  in 10 days after discharge from SNF   Contact information:   [address] [place] [id number] [phone]       The results of significant diagnostics from this hospitalization (including imaging, microbiology, ancillary and laboratory) are listed below for reference.    Significant Diagnostic Studies: Dg Chest 2 View  03/06/2015   CLINICAL DATA:  Shortness of breath, productive cough, generalized body weakness.  EXAM: CHEST  2 VIEW  COMPARISON:  03/05/2015  FINDINGS: Dual lead cardiac pacemaker is stable.  Cardiomediastinal silhouette is persistently enlarged. Mediastinal contours appear intact.  There is no evidence of  pneumothorax. There is a left pleural effusion with left lower lobe airspace consolidation versus atelectasis. There may be a small right pleural effusion.  Osseous structures are without acute abnormality. Soft tissues are grossly normal.  IMPRESSION: Left pleural effusion with left lower lobe airspace consolidation versus atelectasis.  Probable small right pleural effusion.  Stably enlarged cardiac silhouette.   Electronically Signed   By: Fidela Salisbury M.D.   On: 03/06/2015 09:10   Ct Head Wo Contrast  03/05/2015   CLINICAL DATA:  Patient with weakness. Shortness of breath. Lethargy.  EXAM: CT HEAD WITHOUT CONTRAST  TECHNIQUE: Contiguous axial images were obtained from the  base of the skull through the vertex without intravenous contrast.  COMPARISON:  Brain CT 08/21/2013  FINDINGS: Ventricles and sulci are prominent compatible with atrophy. Extensive periventricular and subcortical white matter hypodensity compatible with chronic small vessel ischemic changes. Old bilateral basal ganglia lacunar infarcts. No evidence for acute cortically based infarct, intracranial hemorrhage or mass effect. Unchanged 18 mm ossified mass within the subfrontal region on the right adjacent to the falx. Orbits are unremarkable. Paranasal sinuses are well aerated. Mastoid air cells are unremarkable. Calvarium is intact.  IMPRESSION: No acute intracranial process.  Chronic small vessel ischemic changes cortical atrophy.  Stable probable calcified meningioma within the frontal lobe adjacent to the falx.   Electronically Signed   By: Lovey Newcomer M.D.   On: 03/05/2015 14:38   Dg Chest Port 1 View  03/05/2015   CLINICAL DATA:  Nursing home patient with 24 hour duration weakness and low oxygen saturation.  EXAM: PORTABLE CHEST 1 VIEW  COMPARISON:  07/24/2013  FINDINGS: Dual lead pacemaker appears unchanged. There is chronic cardiomegaly with left ventricular prominence. There is newly seen venous hypertension with early interstitial edema. Chronic mitral valve calcification and calcified left hilar nodes. No effusions. Old traumatic changes in the region of the left shoulder.  IMPRESSION: Cardiomegaly with left ventricular prominence. Venous hypertension with early interstitial edema.   Electronically Signed   By: Nelson Chimes M.D.   On: 03/05/2015 13:49    Microbiology: Recent Results (from the past 240 hour(s))  Culture, blood (single)     Status: None (Preliminary result)   Collection Time: 03/05/15  1:22 PM  Result Value Ref Range Status   Specimen Description LEFT ANTECUBITAL  Final   Special Requests BOTTLES DRAWN AEROBIC AND ANAEROBIC 5CC  Final   Culture   Final    NO GROWTH 2  DAYS Performed at [facility]    Report Status PENDING  Incomplete  Culture, blood (routine x 2) Call MD if unable to obtain prior to antibiotics being given     Status: None (Preliminary result)   Collection Time: 03/05/15  1:37 PM  Result Value Ref Range Status   Specimen Description BLOOD LEFT HAND  Final   Special Requests IN PEDIATRIC BOTTLE [place]  Final   Culture   Final    NO GROWTH 2 DAYS Performed at [facility]    Report Status PENDING  Incomplete  Urine culture     Status: None   Collection Time: 03/06/15  2:59 AM  Result Value Ref Range Status   Specimen Description URINE, CATHETERIZED  Final   Special Requests NONE  Final   Culture   Final    NO GROWTH 1 DAY Performed at [facility]    Report Status 03/07/2015 FINAL  Final  Culture, sputum-assessment     Status: None   Collection  Time: 03/06/15 11:30 AM  Result Value Ref Range Status   Specimen Description SPUTUM  Final   Special Requests NONE  Final   Sputum evaluation   Final    THIS SPECIMEN IS ACCEPTABLE. RESPIRATORY CULTURE REPORT TO FOLLOW.   Report Status 03/06/2015 FINAL  Final  Culture, respiratory (NON-Expectorated)     Status: None (Preliminary result)   Collection Time: 03/06/15 11:30 AM  Result Value Ref Range Status   Specimen Description SPUTUM  Final   Special Requests NONE  Final   Gram Stain   Final    FEW WBC PRESENT, PREDOMINANTLY PMN RARE SQUAMOUS EPITHELIAL CELLS PRESENT NO ORGANISMS SEEN Performed at [facility]    Culture NO GROWTH Performed at [facility]   Final   Report Status PENDING  Incomplete     Labs: Basic Metabolic Panel:  Recent Labs Lab 03/05/15 1321 03/05/15 1857 03/06/15 0410 03/07/15 0425  NA 142  --  142 142  K 3.7  --  3.5 3.9  CL 109  --  111 113*  CO2 26  --  24 24  GLUCOSE 125*  --  110* 106*  BUN 23*  --  21* 22*  CREATININE 1.04* 0.90 1.00 0.80  CALCIUM 9.4  --  8.6* 8.6*  MG  --  1.8  --   --   PHOS   --  2.6  --   --    Liver Function Tests:  Recent Labs Lab 03/05/15 1321  AST 19  ALT 10*  ALKPHOS 55  BILITOT 1.6*  PROT 6.7  ALBUMIN 3.8   CBC:  Recent Labs Lab 03/05/15 1321 03/05/15 1857 03/06/15 0410 03/07/15 0425  WBC 9.8 9.5 7.7 7.6  NEUTROABS 8.2*  --   --   --   HGB 12.5 10.8* 10.9* 10.7*  HCT 39.3 33.7* 33.8* 33.7*  MCV 90.8 90.8 91.4 91.3  PLT 207 194 175 192   CBG:  Recent Labs Lab 03/07/15 0848 03/07/15 1143 03/07/15 1631 03/07/15 2221 03/08/15 0752  GLUCAP 108* 109* 207* 71 114*    Signed:  Barton Dubois  [facility] 03/08/2015, 10:04 AM

## 2015-03-08 NOTE — Progress Notes (Signed)
Attempted to call report to [facility] x3(left message for callback x2) [phone]. Also called main number [phone] ans was given above number for nursing supervisor(no answer)

## 2015-03-08 NOTE — Progress Notes (Signed)
Pt for discharge to [facility].   CSW facilitated pt discharge needs including contacting facility, faxing pt discharge information via [facility], discussing with pt daughter, [name] at bedside, providing RN phone number to call, and arranging ambulance transport to [facility].  Pt daughter coping appropriately with pt transfer to [facility]. Pt daughter familiar with process and feels that pt will transition well to facility as pt has been to [facility] in the past.  No further social work needs identified at this time.  CSW signing off.   Alison Murray, MSW, [place] Work [phone]

## 2015-03-08 NOTE — Clinical Social Work Placement (Signed)
   CLINICAL SOCIAL WORK PLACEMENT  NOTE  Date:  03/08/2015  Patient Details  Name: Briana Thornton MRN: [id number] Date of Birth: Nov 05, 1926  Clinical Social Work is seeking post-discharge placement for this patient at the [place] level of care (*CSW will initial, date and re-position this form in  chart as items are completed):  Yes   Patient/family provided with [place] Work Department's list of facilities offering this level of care within the geographic area requested by the patient (or if unable, by the patient's family).  Yes   Patient/family informed of their freedom to choose among providers that offer the needed level of care, that participate in Medicare, Medicaid or managed care program needed by the patient, have an available bed and are willing to accept the patient.  Yes   Patient/family informed of [facility]'s ownership interest in [facility] and [facility], as well as of the fact that they are under no obligation to receive care at these facilities.  PASRR submitted to EDS on       PASRR number received on       Existing PASRR number confirmed on 03/06/15     FL2 transmitted to all facilities in geographic area requested by pt/family on 03/06/15     FL2 transmitted to all facilities within larger geographic area on       Patient informed that his/her managed care company has contracts with or will negotiate with certain facilities, including the following:        Yes   Patient/family informed of bed offers received.  Patient chooses bed at [facility]     Physician recommends and patient chooses bed at      Patient to be transferred to [facility] on 03/08/15.  Patient to be transferred to facility by ambulance Corey Harold)     Patient family notified on 03/08/15 of transfer.  Name of family member notified:  pt daughter, Briana Thornton notified at bedside     PHYSICIAN Please sign FL2, Please sign DNR     Additional  Comment:    _______________________________________________ Ladell Pier, LCSW 03/08/2015, 2:17 PM

## 2015-03-08 NOTE — Progress Notes (Signed)
02 sats on Rm Air ranged 86-95%. 86% with coughing and did however quickly rebound to 92-95%. MD present during this time and will plan to D/C pt to [facility] with PRN 02 and have them re-eval and reassess needs for supplemental 02 as needed(with activity,at bedtime,etc)

## 2015-03-09 ENCOUNTER — Non-Acute Institutional Stay (SKILLED_NURSING_FACILITY): Payer: Medicare Other | Admitting: Adult Health

## 2015-03-09 ENCOUNTER — Telehealth: Payer: Self-pay

## 2015-03-09 ENCOUNTER — Encounter: Payer: Self-pay | Admitting: Adult Health

## 2015-03-09 DIAGNOSIS — F0390 Unspecified dementia without behavioral disturbance: Secondary | ICD-10-CM | POA: Diagnosis not present

## 2015-03-09 DIAGNOSIS — K5900 Constipation, unspecified: Secondary | ICD-10-CM | POA: Diagnosis not present

## 2015-03-09 DIAGNOSIS — E785 Hyperlipidemia, unspecified: Secondary | ICD-10-CM | POA: Diagnosis not present

## 2015-03-09 DIAGNOSIS — J189 Pneumonia, unspecified organism: Secondary | ICD-10-CM

## 2015-03-09 DIAGNOSIS — I10 Essential (primary) hypertension: Secondary | ICD-10-CM

## 2015-03-09 DIAGNOSIS — R531 Weakness: Secondary | ICD-10-CM

## 2015-03-09 DIAGNOSIS — E119 Type 2 diabetes mellitus without complications: Secondary | ICD-10-CM | POA: Diagnosis not present

## 2015-03-09 LAB — CULTURE, RESPIRATORY W GRAM STAIN: Culture: NORMAL

## 2015-03-09 LAB — CULTURE, RESPIRATORY

## 2015-03-09 NOTE — Telephone Encounter (Signed)
Spoke to pt's daughter who states that pt is currently at [facility].  Will schedule hospital follow up after discharge.

## 2015-03-10 LAB — CULTURE, BLOOD (SINGLE): CULTURE: NO GROWTH

## 2015-03-10 LAB — CULTURE, BLOOD (ROUTINE X 2): Culture: NO GROWTH

## 2015-03-11 ENCOUNTER — Non-Acute Institutional Stay (SKILLED_NURSING_FACILITY): Payer: Medicare Other | Admitting: Internal Medicine

## 2015-03-11 DIAGNOSIS — R531 Weakness: Secondary | ICD-10-CM | POA: Diagnosis not present

## 2015-03-11 DIAGNOSIS — E785 Hyperlipidemia, unspecified: Secondary | ICD-10-CM

## 2015-03-11 DIAGNOSIS — R1310 Dysphagia, unspecified: Secondary | ICD-10-CM

## 2015-03-11 DIAGNOSIS — J189 Pneumonia, unspecified organism: Secondary | ICD-10-CM

## 2015-03-11 DIAGNOSIS — F0390 Unspecified dementia without behavioral disturbance: Secondary | ICD-10-CM | POA: Diagnosis not present

## 2015-03-11 DIAGNOSIS — E119 Type 2 diabetes mellitus without complications: Secondary | ICD-10-CM | POA: Diagnosis not present

## 2015-03-11 DIAGNOSIS — K5900 Constipation, unspecified: Secondary | ICD-10-CM | POA: Diagnosis not present

## 2015-03-11 DIAGNOSIS — I10 Essential (primary) hypertension: Secondary | ICD-10-CM | POA: Diagnosis not present

## 2015-03-11 DIAGNOSIS — R062 Wheezing: Secondary | ICD-10-CM | POA: Diagnosis not present

## 2015-03-11 DIAGNOSIS — D649 Anemia, unspecified: Secondary | ICD-10-CM

## 2015-03-11 NOTE — Progress Notes (Signed)
Patient ID: Briana Thornton, female   DOB: 01/26/1927, 81 y.o.   MRN: [id number]       [facility]  PCP: Penni Homans, MD  Code Status: DNR  No Known Allergies  Chief Complaint  Patient presents with  . New Admit To SNF     HPI:  81 y.o. patient is here for short term rehabilitation post hospital admission from 03/05/15-03/08/15 with generalized weakness, fever and dyspnea. She was treated for community acquired pneumonia with o2, antibiotics and iv fluids.  She has PMH of DM, HTN, HLD, dementia among others. She is seen in her room today with her daughter present. She feels weak and tired. Her Appetite is slowly picking up.   Review of Systems:  Constitutional: Negative for fever, chills, diaphoresis.  HENT: Negative for headache, congestion, nasal discharge Eyes: Negative for eye pain, blurred vision, double vision and discharge.  Respiratory: positive for cough. Negative for shortness of breath and wheezing.   Cardiovascular: Negative for chest pain, palpitations, leg swelling.  Gastrointestinal: Negative for heartburn, nausea, vomiting, abdominal pain. Had bowel movement this Wednesday Genitourinary: Negative for dysuria, flank pain.  Musculoskeletal: Negative for back pain, falls in the facility Skin: Negative for itching, rash.  Neurological: Negative for dizziness, tingling, focal weakness Psychiatric/Behavioral: Negative for depression   Past Medical History  Diagnosis Date  . Chicken pox as a child  . Measles as a child  . IBS (irritable bowel syndrome)   . Diabetes mellitus 47    type 2  . Hyperlipidemia 72  . Hypertension 72  . Vision loss of right eye   . Vertigo 2010    benign  . Calcification of cartilage     ear  . Cardiovascular system disease 07/02/2011  . Hemorrhoid 07/02/2011  . Osteoporosis 07/02/2011  . Weight loss 07/31/2011  . Anemia 05/22/2012  . Broken wrist 10-22-11    left  . Depression 05/22/2012  .  Dermatitis 05/22/2012    Right neck   Past Surgical History  Procedure Laterality Date  . Hemorroidectomy  1979  . Knee scoped  2002    right  . Rotator cuff repair  2004    right  . Abdominal hysterectomy  2008    partial still has ovaries  . Lens implant left  2006  . Pacemaker insertion  2010  . Wrist surgery  11-05-11    left wrist  . Intramedullary (im) nail intertrochanteric Right 07/24/2013    Procedure: INTRAMEDULLARY (IM) NAIL INTERTROCHANTRIC;  Surgeon: Gearlean Alf, MD;  Location: [facility];  Service: Orthopedics;  Laterality: Right;   Social History:   reports that she has never smoked. She has never used smokeless tobacco. She reports that she does not drink alcohol or use illicit drugs.  Family History  Problem Relation Age of Onset  . Diabetes Mother     type 2  . Hypertension Mother   . Cancer Brother     lung-smoker  . Diabetes Daughter     pre diabetic  . Diabetes Son     type 2  . Cancer Maternal Grandfather     prostate    Medications:   Medication List       This list is accurate as of: 03/11/15  9:44 AM.  Always use your most recent med list.               acetaminophen 325 MG tablet  Commonly known as:  TYLENOL  Take 2 tablets (  650 mg total) by mouth every 6 (six) hours as needed for mild pain (or Fever >/= 101).     alum & mag hydroxide-simeth 200-200-20 MG/5ML suspension  Commonly known as:  MAALOX/MYLANTA  Take 30 mLs by mouth every 6 (six) hours as needed for indigestion or heartburn (dyspepsia).     amoxicillin-clavulanate 875-125 MG tablet  Commonly known as:  AUGMENTIN  Take 1 tablet by mouth 2 (two) times daily.     atorvastatin 10 MG tablet  Commonly known as:  LIPITOR  TAKE 1 TABLET (10 MG TOTAL) BY MOUTH DAILY.     bisacodyl 10 MG suppository  Commonly known as:  DULCOLAX  Place 1 suppository (10 mg total) rectally daily as needed for moderate constipation.     docusate sodium 100 MG capsule  Commonly known as:   COLACE  Take 100 mg by mouth 2 (two) times daily as needed for mild constipation.     donepezil 10 MG tablet  Commonly known as:  ARICEPT  Take 1 tablet (10 mg total) by mouth at bedtime.     guaiFENesin 600 MG 12 hr tablet  Commonly known as:  MUCINEX  Take 1 tablet (600 mg total) by mouth 2 (two) times daily.     Ipratropium-Albuterol 20-100 MCG/ACT Aers respimat  Commonly known as:  COMBIVENT RESPIMAT  Inhale 1 puff into the lungs every 6 (six) hours as needed for wheezing or shortness of breath.     metFORMIN 500 MG 24 hr tablet  Commonly known as:  GLUCOPHAGE-XR  TAKE 1 TABLET (500 MG TOTAL) BY MOUTH DAILY WITH BREAKFAST.     methylcellulose 1 % ophthalmic solution  Commonly known as:  ARTIFICIAL TEARS  Place 1 drop into both eyes as needed (dry eyes).     multivitamin with minerals Tabs tablet  Take 2 tablets by mouth daily.     polyethylene glycol packet  Commonly known as:  MIRALAX / GLYCOLAX  Take 17 g by mouth daily as needed for mild constipation.     PROBIOTIC DAILY PO  Take 1 tablet by mouth daily.         Physical Exam: Filed Vitals:   03/11/15 0939  BP: 141/67  Pulse: 69  Temp: 98.6 F (37 C)  Resp: 18  SpO2: 96%    General- elderly female, frail, in no acute distress Head- normocephalic, atraumatic Nose- normal nasal mucosa, no maxillary or frontal sinus tenderness, no nasal discharge Throat- moist mucus membrane Eyes- PERRLA, EOMI, no pallor, no icterus, no discharge, normal conjunctiva, normal sclera Neck- no cervical lymphadenopathy Cardiovascular- normal s1,s2, no leg edema Respiratory- bilateral poor air entry with + wheeze, + rhonchi, no crackles, no use of accessory muscles, on o2 Abdomen- bowel sounds present, soft, non tender Musculoskeletal- able to move all 4 extremities, generalized weakness, unsteady gait Neurological- no focal deficit, alert and oriented to person, place and time Skin- warm and dry Psychiatry- normal mood and  affect    Labs reviewed: Basic Metabolic Panel:  Recent Labs  03/05/15 1321 03/05/15 1857 03/06/15 0410 03/07/15 0425  NA 142  --  142 142  K 3.7  --  3.5 3.9  CL 109  --  111 113*  CO2 26  --  24 24  GLUCOSE 125*  --  110* 106*  BUN 23*  --  21* 22*  CREATININE 1.04* 0.90 1.00 0.80  CALCIUM 9.4  --  8.6* 8.6*  MG  --  1.8  --   --  PHOS  --  2.6  --   --    Liver Function Tests:  Recent Labs  10/21/14 1105 03/05/15 1321  AST 17 19  ALT 10 10*  ALKPHOS 61 55  BILITOT 0.9 1.6*  PROT 6.5 6.7  ALBUMIN 3.9 3.8   No results for input(s): LIPASE, AMYLASE in the last 8760 hours. No results for input(s): AMMONIA in the last 8760 hours. CBC:  Recent Labs  03/05/15 1321 03/05/15 1857 03/06/15 0410 03/07/15 0425  WBC 9.8 9.5 7.7 7.6  NEUTROABS 8.2*  --   --   --   HGB 12.5 10.8* 10.9* 10.7*  HCT 39.3 33.7* 33.8* 33.7*  MCV 90.8 90.8 91.4 91.3  PLT 207 194 175 192   Cardiac Enzymes: No results for input(s): CKTOTAL, CKMB, CKMBINDEX, TROPONINI in the last 8760 hours. BNP: Invalid input(s): POCBNP CBG:  Recent Labs  03/07/15 2221 03/08/15 0752 03/08/15 1144  GLUCAP 71 114* 164*    Radiological Exams: Dg Chest 2 View  03/06/2015  CLINICAL DATA:  Shortness of breath, productive cough, generalized body weakness. EXAM: CHEST  2 VIEW COMPARISON:  03/05/2015 FINDINGS: Dual lead cardiac pacemaker is stable. Cardiomediastinal silhouette is persistently enlarged. Mediastinal contours appear intact. There is no evidence of pneumothorax. There is a left pleural effusion with left lower lobe airspace consolidation versus atelectasis. There may be a small right pleural effusion. Osseous structures are without acute abnormality. Soft tissues are grossly normal. IMPRESSION: Left pleural effusion with left lower lobe airspace consolidation versus atelectasis. Probable small right pleural effusion. Stably enlarged cardiac silhouette. Electronically Signed   By: Fidela Salisbury M.D.   On: 03/06/2015 09:10   Ct Head Wo Contrast  03/05/2015  CLINICAL DATA:  Patient with weakness. Shortness of breath. Lethargy. EXAM: CT HEAD WITHOUT CONTRAST TECHNIQUE: Contiguous axial images were obtained from the base of the skull through the vertex without intravenous contrast. COMPARISON:  Brain CT 08/21/2013 FINDINGS: Ventricles and sulci are prominent compatible with atrophy. Extensive periventricular and subcortical white matter hypodensity compatible with chronic small vessel ischemic changes. Old bilateral basal ganglia lacunar infarcts. No evidence for acute cortically based infarct, intracranial hemorrhage or mass effect. Unchanged 18 mm ossified mass within the subfrontal region on the right adjacent to the falx. Orbits are unremarkable. Paranasal sinuses are well aerated. Mastoid air cells are unremarkable. Calvarium is intact. IMPRESSION: No acute intracranial process. Chronic small vessel ischemic changes cortical atrophy. Stable probable calcified meningioma within the frontal lobe adjacent to the falx. Electronically Signed   By: Lovey Newcomer M.D.   On: 03/05/2015 14:38   Dg Chest Port 1 View  03/05/2015  CLINICAL DATA:  Nursing home patient with 24 hour duration weakness and low oxygen saturation. EXAM: PORTABLE CHEST 1 VIEW COMPARISON:  07/24/2013 FINDINGS: Dual lead pacemaker appears unchanged. There is chronic cardiomegaly with left ventricular prominence. There is newly seen venous hypertension with early interstitial edema. Chronic mitral valve calcification and calcified left hilar nodes. No effusions. Old traumatic changes in the region of the left shoulder. IMPRESSION: Cardiomegaly with left ventricular prominence. Venous hypertension with early interstitial edema. Electronically Signed   By: Nelson Chimes M.D.   On: 03/05/2015 13:49    Assessment/Plan  Generalized weakness Will have her work with physical therapy and occupational therapy team to help with gait  training and muscle strengthening exercises.fall precautions. Skin care. Encourage to be out of bed.   Community acquired pneumonia   continue Augmentin 875 mg twice a day until 03/13/15.  Continue o2 for now and to wean it as tolerated. Add incentive spirometer to prevent atelectasis and encouraged to use it. Aspiration precautions. Continue respimat and mucinex. Add duoneb q8h x 1 week to help with her wheezing  Wheeze With her recent PNA. Add duoneb q8h x 1 week and reassess  Anemia Unspecified, likely a combination of chronic illness and recent acute illness. Monitor cbc  Dementia No behavior disturbance noted. Continue aricept 10 mg daily. To provide assistance with ADLs, will need complete assistance with feed for now due to her weakness. Pressure ulcer prophylaxis  Dysphagia Currently on regular diet with thin liquids, aspiration precautions  Hypertension  Stable for now, currently not on any medication, monitor  DM a1c 6, continue metformin 500 mg daily, continue statin  Hyperlipidemia  continue Lipitor 10 mg daily  Constipation Stable, continue colace 100 mg bid prn with prn miralax and dulcolax suppositry and monitor    Goals of care: short term rehabilitation   Labs/tests ordered: BMP, cbc, cxr  Family/ staff Communication: reviewed care plan with patient and nursing supervisor    Blanchie Serve, MD  [facility] [phone] (Monday-Friday 8 am - 5 pm) [phone] (afterhours)

## 2015-03-17 LAB — CBC AND DIFFERENTIAL
HEMATOCRIT: 32 % — AB (ref 36–46)
HEMOGLOBIN: 9.5 g/dL — AB (ref 12.0–16.0)
NEUTROS ABS: 4 /uL
PLATELETS: 300 10*3/uL (ref 150–399)
WBC: 6.7 10*3/mL

## 2015-03-17 LAB — BASIC METABOLIC PANEL
BUN: 24 mg/dL — AB (ref 4–21)
Creatinine: 0.9 mg/dL (ref 0.5–1.1)
GLUCOSE: 90 mg/dL
Potassium: 4 mmol/L (ref 3.4–5.3)
SODIUM: 144 mmol/L (ref 137–147)

## 2015-03-31 ENCOUNTER — Non-Acute Institutional Stay (SKILLED_NURSING_FACILITY): Payer: Medicare Other | Admitting: Adult Health

## 2015-03-31 ENCOUNTER — Encounter: Payer: Self-pay | Admitting: Adult Health

## 2015-03-31 DIAGNOSIS — I10 Essential (primary) hypertension: Secondary | ICD-10-CM

## 2015-03-31 DIAGNOSIS — K5900 Constipation, unspecified: Secondary | ICD-10-CM

## 2015-03-31 DIAGNOSIS — R531 Weakness: Secondary | ICD-10-CM | POA: Diagnosis not present

## 2015-03-31 DIAGNOSIS — E119 Type 2 diabetes mellitus without complications: Secondary | ICD-10-CM

## 2015-03-31 DIAGNOSIS — E785 Hyperlipidemia, unspecified: Secondary | ICD-10-CM | POA: Diagnosis not present

## 2015-03-31 DIAGNOSIS — F0390 Unspecified dementia without behavioral disturbance: Secondary | ICD-10-CM | POA: Diagnosis not present

## 2015-04-02 NOTE — Progress Notes (Signed)
Patient ID: Briana Thornton, female   DOB: 1927-03-09, 81 y.o.   MRN: [id number]    DATE:  03/09/15   MRN:  [id number]  BIRTHDAY: 1926-09-22  Facility:  [facility] Home Location:  [facility] Room Number: 409-7  LEVEL OF CARE:  SNF [phone])  Contact Information    Name Relation Home Work [place] Daughter [phone]   Dorian Heckle [phone]     [place], [place]   [phone]       Chief Complaint  Patient presents with  . Hospitalization Follow-up    Generalized weakness, pneumonia, hypertension, hyperlipidemia, diabetes mellitus, dementia and constipation    HISTORY OF PRESENT ILLNESS:  This is an 81 year old female who has been admitted to [facility] on 03/08/15. She has PMH of hypertension, hyperlipidemia, diabetes mellitus (not on insulin) and history of dementia without behavioral disturbance. She was having generalized weakness, low grade fever and SOB. She was diagnosed and treated with pneumonia. She has been discharged with Augmentin twice a day 5 more days. She was also treated with IVF's for mild dehydration.  She has been admitted for a short-term rehabilitation.  PAST MEDICAL HISTORY:  Past Medical History  Diagnosis Date  . Chicken pox as a child  . Measles as a child  . IBS (irritable bowel syndrome)   . Diabetes mellitus 47    type 2  . Hyperlipidemia 72  . Hypertension 72  . Vision loss of right eye   . Vertigo 2010    benign  . Calcification of cartilage     ear  . Cardiovascular system disease 07/02/2011  . Hemorrhoid 07/02/2011  . Osteoporosis 07/02/2011  . Weight loss 07/31/2011  . Anemia 05/22/2012  . Broken wrist 10-22-11    left  . Depression 05/22/2012  . Dermatitis 05/22/2012    Right neck     CURRENT MEDICATIONS: Reviewed  Patient's Medications  New Prescriptions   No medications on file  Previous Medications   ACETAMINOPHEN (TYLENOL) 325 MG TABLET    Take 2  tablets (650 mg total) by mouth every 6 (six) hours as needed for mild pain (or Fever >/= 101).   ALUM & MAG HYDROXIDE-SIMETH (MAALOX/MYLANTA) 200-200-20 MG/5ML SUSPENSION    Take 30 mLs by mouth every 6 (six) hours as needed for indigestion or heartburn (dyspepsia).   ATORVASTATIN (LIPITOR) 10 MG TABLET    TAKE 1 TABLET (10 MG TOTAL) BY MOUTH DAILY.   BISACODYL (DULCOLAX) 10 MG SUPPOSITORY    Place 1 suppository (10 mg total) rectally daily as needed for moderate constipation.   DOCUSATE SODIUM (COLACE) 100 MG CAPSULE    Take 100 mg by mouth 2 (two) times daily as needed for mild constipation.   DONEPEZIL (ARICEPT) 10 MG TABLET    Take 1 tablet (10 mg total) by mouth at bedtime.   GUAIFENESIN (MUCINEX) 600 MG 12 HR TABLET    Take 1 tablet (600 mg total) by mouth 2 (two) times daily.   IPRATROPIUM-ALBUTEROL (COMBIVENT RESPIMAT) 20-100 MCG/ACT AERS RESPIMAT    Inhale 1 puff into the lungs every 6 (six) hours as needed for wheezing or shortness of breath.   METFORMIN (GLUCOPHAGE-XR) 500 MG 24 HR TABLET    TAKE 1 TABLET (500 MG TOTAL) BY MOUTH DAILY WITH BREAKFAST.   METHYLCELLULOSE (ARTIFICIAL TEARS) 1 % OPHTHALMIC SOLUTION    Place 1 drop into both eyes as needed (dry eyes).    MULTIPLE VITAMIN (MULTIVITAMIN WITH MINERALS) TABS  TABLET    Take 2 tablets by mouth daily.   POLYETHYLENE GLYCOL (MIRALAX / GLYCOLAX) PACKET    Take 17 g by mouth daily as needed for mild constipation.   PROBIOTIC PRODUCT (PROBIOTIC DAILY PO)    Take 1 tablet by mouth daily.  Modified Medications   No medications on file  Discontinued Medications   No medications on file     No Known Allergies   REVIEW OF SYSTEMS:  GENERAL: no change in appetite, no fatigue, no weight changes, no fever, chills or weakness EYES: Denies change in vision, dry eyes, eye pain, itching or discharge EARS: Denies change in hearing, ringing in ears, or earache NOSE: Denies nasal congestion or epistaxis MOUTH and THROAT: Denies oral  discomfort, gingival pain or bleeding, pain from teeth or hoarseness   RESPIRATORY: no cough, SOB, DOE, wheezing, hemoptysis CARDIAC: no chest pain, edema or palpitations GI: no abdominal pain, diarrhea, heart burn, nausea or vomiting, +constipation GU: Denies dysuria, frequency, hematuria, incontinence, or discharge PSYCHIATRIC: Denies feeling of depression or anxiety. No report of hallucinations, insomnia, paranoia, or agitation   PHYSICAL EXAMINATION  GENERAL APPEARANCE: Well nourished. In no acute distress. Normal body habitus HEAD: Normal in size and contour. No evidence of trauma EYES: Lids open and close normally. No blepharitis, entropion or ectropion. PERRL. Conjunctivae are clear and sclerae are white. Lenses are without opacity EARS: Pinnae are normal. Patient hears normal voice tunes of the examiner MOUTH and THROAT: Lips are without lesions. Oral mucosa is moist and without lesions. Tongue is normal in shape, size, and color and without lesions NECK: supple, trachea midline, no neck masses, no thyroid tenderness, no thyromegaly LYMPHATICS: no LAN in the neck, no supraclavicular LAN RESPIRATORY: breathing is even & unlabored, BS CTAB CARDIAC: RRR, no murmur,no extra heart sounds, no edema, left chest pacemaker GI: abdomen soft, normal BS, no masses, no tenderness, no hepatomegaly, no splenomegaly EXTREMITIES:  Able to move 4 extremities PSYCHIATRIC: Alert to self. Affect and behavior are appropriate  LABS/RADIOLOGY: Labs reviewed: Basic Metabolic Panel:  Recent Labs  03/05/15 1321 03/05/15 1857 03/06/15 0410 03/07/15 0425  NA 142  --  142 142  K 3.7  --  3.5 3.9  CL 109  --  111 113*  CO2 26  --  24 24  GLUCOSE 125*  --  110* 106*  BUN 23*  --  21* 22*  CREATININE 1.04* 0.90 1.00 0.80  CALCIUM 9.4  --  8.6* 8.6*  MG  --  1.8  --   --   PHOS  --  2.6  --   --    Liver Function Tests:  Recent Labs  10/21/14 1105 03/05/15 1321  AST 17 19  ALT 10 10*    ALKPHOS 61 55  BILITOT 0.9 1.6*  PROT 6.5 6.7  ALBUMIN 3.9 3.8   CBC:  Recent Labs  03/05/15 1321 03/05/15 1857 03/06/15 0410 03/07/15 0425  WBC 9.8 9.5 7.7 7.6  NEUTROABS 8.2*  --   --   --   HGB 12.5 10.8* 10.9* 10.7*  HCT 39.3 33.7* 33.8* 33.7*  MCV 90.8 90.8 91.4 91.3  PLT 207 194 175 192   Lipid Panel:  Recent Labs  10/21/14 1105  HDL 74.60   CBG:  Recent Labs  03/07/15 2221 03/08/15 0752 03/08/15 1144  GLUCAP 71 114* 164*     Dg Chest 2 View  03/06/2015  CLINICAL DATA:  Shortness of breath, productive cough, generalized body weakness. EXAM: CHEST  2 VIEW COMPARISON:  03/05/2015 FINDINGS: Dual lead cardiac pacemaker is stable. Cardiomediastinal silhouette is persistently enlarged. Mediastinal contours appear intact. There is no evidence of pneumothorax. There is a left pleural effusion with left lower lobe airspace consolidation versus atelectasis. There may be a small right pleural effusion. Osseous structures are without acute abnormality. Soft tissues are grossly normal. IMPRESSION: Left pleural effusion with left lower lobe airspace consolidation versus atelectasis. Probable small right pleural effusion. Stably enlarged cardiac silhouette. Electronically Signed   By: Fidela Salisbury M.D.   On: 03/06/2015 09:10   Ct Head Wo Contrast  03/05/2015  CLINICAL DATA:  Patient with weakness. Shortness of breath. Lethargy. EXAM: CT HEAD WITHOUT CONTRAST TECHNIQUE: Contiguous axial images were obtained from the base of the skull through the vertex without intravenous contrast. COMPARISON:  Brain CT 08/21/2013 FINDINGS: Ventricles and sulci are prominent compatible with atrophy. Extensive periventricular and subcortical white matter hypodensity compatible with chronic small vessel ischemic changes. Old bilateral basal ganglia lacunar infarcts. No evidence for acute cortically based infarct, intracranial hemorrhage or mass effect. Unchanged 18 mm ossified mass within the  subfrontal region on the right adjacent to the falx. Orbits are unremarkable. Paranasal sinuses are well aerated. Mastoid air cells are unremarkable. Calvarium is intact. IMPRESSION: No acute intracranial process. Chronic small vessel ischemic changes cortical atrophy. Stable probable calcified meningioma within the frontal lobe adjacent to the falx. Electronically Signed   By: Lovey Newcomer M.D.   On: 03/05/2015 14:38   Dg Chest Port 1 View  03/05/2015  CLINICAL DATA:  Nursing home patient with 24 hour duration weakness and low oxygen saturation. EXAM: PORTABLE CHEST 1 VIEW COMPARISON:  07/24/2013 FINDINGS: Dual lead pacemaker appears unchanged. There is chronic cardiomegaly with left ventricular prominence. There is newly seen venous hypertension with early interstitial edema. Chronic mitral valve calcification and calcified left hilar nodes. No effusions. Old traumatic changes in the region of the left shoulder. IMPRESSION: Cardiomegaly with left ventricular prominence. Venous hypertension with early interstitial edema. Electronically Signed   By: Nelson Chimes M.D.   On: 03/05/2015 13:49    ASSESSMENT/PLAN:  Generalized weakness - for rehabilitation  Pneumonia - continue Augmentin 875-125 milligrams 1 tab by mouth twice a day 7 days, Mucinex 600 mg 1 tab by mouth twice a day, Combiventl Respimat 20-100 mcg/ACT AERS inhale 1 puff into the lungs every 6 hours when necessary and wean off O2 as tolerated; check CBC  Hypertension - well controlled; on no medications  Hyperlipidemia - continue Lipitor 10 mg 1 tab by mouth daily  Diabetes mellitus, type II - hemoglobin A1c 6.0; well controlled; continue metformin 500 mg 24 hour 1 tab by mouth daily; check BMP in 1 week  Dementia without behavioral disturbance - continue Aricept 10 mg 1 tab by mouth daily at bedtime; continue supportive care  Constipation - continue MiraLAX 17 g by mouth daily when necessary, Dulcolax 10 mg 1 suppository rectally daily  when necessary and Colace 100 mg 1 capsule by mouth twice a day when necessary     Goals of care:  Short-term rehabilitation    [facility] [phone]

## 2015-04-02 NOTE — Progress Notes (Signed)
Patient ID: Briana Thornton, female   DOB: 04-11-1927, 81 y.o.   MRN: [id number]    DATE:  03/31/15  MRN:  [id number]  BIRTHDAY: 04/03/1927  Facility:  Nursing Home Location:  [place] Room Number: 407-P  LEVEL OF CARE:  SNF [phone])  Contact Information    Name [name] [phone]   Dorian Heckle [phone]     Tawny, Raspberry   [phone]       Chief Complaint  Patient presents with  . Discharge Note    Generalized weakness, hypertension, hyperlipidemia, diabetes mellitus, dementia and constipation    HISTORY OF PRESENT ILLNESS:  This is an 81 year old female who is for discharge home with home health PT, OT and home health aide. She has been admitted to [facility] on 03/08/15. She has PMH of hypertension, hyperlipidemia, diabetes mellitus (not on insulin) and history of dementia without behavioral disturbance. She was having generalized weakness, low grade fever and SOB. She was diagnosed and treated with pneumonia. She has been discharged with Augmentin twice a day 5 more days. She was also treated with IVF's for mild dehydration.  Patient was admitted to this facility for short-term rehabilitation after the patient's recent hospitalization.  Patient has completed SNF rehabilitation and therapy has cleared the patient for discharge.  PAST MEDICAL HISTORY:  Past Medical History  Diagnosis Date  . Chicken pox as a child  . Measles as a child  . IBS (irritable bowel syndrome)   . Diabetes mellitus 47    type 2  . Hyperlipidemia 72  . Hypertension 72  . Vision loss of right eye   . Vertigo 2010    benign  . Calcification of cartilage     ear  . Cardiovascular system disease 07/02/2011  . Hemorrhoid 07/02/2011  . Osteoporosis 07/02/2011  . Weight loss 07/31/2011  . Anemia 05/22/2012  . Broken wrist 10-22-11    left  . Depression 05/22/2012  . Dermatitis  05/22/2012    Right neck     CURRENT MEDICATIONS: Reviewed  Patient's Medications  New Prescriptions   No medications on file  Previous Medications   ACETAMINOPHEN (TYLENOL) 325 MG TABLET    Take 2 tablets (650 mg total) by mouth every 6 (six) hours as needed for mild pain (or Fever >/= 101).   ALUM & MAG HYDROXIDE-SIMETH (MAALOX/MYLANTA) 200-200-20 MG/5ML SUSPENSION    Take 30 mLs by mouth every 6 (six) hours as needed for indigestion or heartburn (dyspepsia).   ATORVASTATIN (LIPITOR) 10 MG TABLET    TAKE 1 TABLET (10 MG TOTAL) BY MOUTH DAILY.   BISACODYL (DULCOLAX) 10 MG SUPPOSITORY    Place 1 suppository (10 mg total) rectally daily as needed for moderate constipation.   DOCUSATE SODIUM (COLACE) 100 MG CAPSULE    Take 100 mg by mouth 2 (two) times daily.    DONEPEZIL (ARICEPT) 10 MG TABLET    Take 1 tablet (10 mg total) by mouth at bedtime.   GUAIFENESIN (MUCINEX) 600 MG 12 HR TABLET    Take 1 tablet (600 mg total) by mouth 2 (two) times daily.   IPRATROPIUM-ALBUTEROL (COMBIVENT RESPIMAT) 20-100 MCG/ACT AERS RESPIMAT    Inhale 1 puff into the lungs every 6 (six) hours as needed for wheezing or shortness of breath.   METFORMIN (GLUCOPHAGE-XR) 500 MG 24 HR TABLET    TAKE 1 TABLET (500 MG TOTAL) BY MOUTH DAILY WITH BREAKFAST.   METHYLCELLULOSE (  ARTIFICIAL TEARS) 1 % OPHTHALMIC SOLUTION    Place 1 drop into both eyes as needed (dry eyes).    MULTIPLE VITAMIN (MULTIVITAMIN WITH MINERALS) TABS TABLET    Take 2 tablets by mouth daily.   POLYETHYLENE GLYCOL (MIRALAX / GLYCOLAX) PACKET    Take 17 g by mouth daily as needed for mild constipation.   PROBIOTIC PRODUCT (PROBIOTIC DAILY PO)    Take 1 tablet by mouth daily.  Modified Medications   No medications on file  Discontinued Medications   No medications on file     No Known Allergies   REVIEW OF SYSTEMS:  GENERAL: no change in appetite, no fatigue, no weight changes, no fever, chills or weakness EYES: Denies change in vision, dry  eyes, eye pain, itching or discharge EARS: Denies change in hearing, ringing in ears, or earache NOSE: Denies nasal congestion or epistaxis MOUTH and THROAT: Denies oral discomfort, gingival pain or bleeding, pain from teeth or hoarseness   RESPIRATORY: no cough, SOB, DOE, wheezing, hemoptysis CARDIAC: no chest pain, or palpitations GI: no abdominal pain, diarrhea, heart burn, nausea or vomiting GU: Denies dysuria, frequency, hematuria, incontinence, or discharge PSYCHIATRIC: Denies feeling of depression or anxiety. No report of hallucinations, insomnia, paranoia, or agitation   PHYSICAL EXAMINATION  GENERAL APPEARANCE: Well nourished. In no acute distress. Normal body habitus HEAD: Normal in size and contour. No evidence of trauma EYES: Lids open and close normally. No blepharitis, entropion or ectropion. PERRL. Conjunctivae are clear and sclerae are white. Lenses are without opacity EARS: Pinnae are normal. Patient hears normal voice tunes of the examiner MOUTH and THROAT: Lips are without lesions. Oral mucosa is moist and without lesions. Tongue is normal in shape, size, and color and without lesions NECK: supple, trachea midline, no neck masses, no thyroid tenderness, no thyromegaly LYMPHATICS: no LAN in the neck, no supraclavicular LAN RESPIRATORY: breathing is even & unlabored, BS CTAB CARDIAC: RRR, no murmur,no extra heart sounds, BLE edema 1+, left chest pacemaker GI: abdomen soft, normal BS, no masses, no tenderness, no hepatomegaly, no splenomegaly EXTREMITIES:  Able to move 4 extremities PSYCHIATRIC: Alert to self. Affect and behavior are appropriate  LABS/RADIOLOGY: Labs reviewed: 03/17/15  WBC 6.7 hemoglobin 9.5 hematocrit 31.5 MCV 93.8 platelets 300 sodium 144 potassium 4.0 glucose 90 BUN 24 creatinine 0.87 calcium 8.8 03/14/15  sodium 144 potassium 3.9 glucose 101 BUN 22 creatinine 0.82 calcium 8.5 Basic Metabolic Panel:  Recent Labs  03/05/15 1321 03/05/15 1857  03/06/15 0410 03/07/15 0425  NA 142  --  142 142  K 3.7  --  3.5 3.9  CL 109  --  111 113*  CO2 26  --  24 24  GLUCOSE 125*  --  110* 106*  BUN 23*  --  21* 22*  CREATININE 1.04* 0.90 1.00 0.80  CALCIUM 9.4  --  8.6* 8.6*  MG  --  1.8  --   --   PHOS  --  2.6  --   --    Liver Function Tests:  Recent Labs  10/21/14 1105 03/05/15 1321  AST 17 19  ALT 10 10*  ALKPHOS 61 55  BILITOT 0.9 1.6*  PROT 6.5 6.7  ALBUMIN 3.9 3.8   CBC:  Recent Labs  03/05/15 1321 03/05/15 1857 03/06/15 0410 03/07/15 0425  WBC 9.8 9.5 7.7 7.6  NEUTROABS 8.2*  --   --   --   HGB 12.5 10.8* 10.9* 10.7*  HCT 39.3 33.7* 33.8* 33.7*  MCV 90.8  90.8 91.4 91.3  PLT 207 194 175 192   Lipid Panel:  Recent Labs  10/21/14 1105  HDL 74.60   CBG:  Recent Labs  03/07/15 2221 03/08/15 0752 03/08/15 1144  GLUCAP 71 114* 164*     Dg Chest 2 View  03/06/2015  CLINICAL DATA:  Shortness of breath, productive cough, generalized body weakness. EXAM: CHEST  2 VIEW COMPARISON:  03/05/2015 FINDINGS: Dual lead cardiac pacemaker is stable. Cardiomediastinal silhouette is persistently enlarged. Mediastinal contours appear intact. There is no evidence of pneumothorax. There is a left pleural effusion with left lower lobe airspace consolidation versus atelectasis. There may be a small right pleural effusion. Osseous structures are without acute abnormality. Soft tissues are grossly normal. IMPRESSION: Left pleural effusion with left lower lobe airspace consolidation versus atelectasis. Probable small right pleural effusion. Stably enlarged cardiac silhouette. Electronically Signed   By: Fidela Salisbury M.D.   On: 03/06/2015 09:10   Ct Head Wo Contrast  03/05/2015  CLINICAL DATA:  Patient with weakness. Shortness of breath. Lethargy. EXAM: CT HEAD WITHOUT CONTRAST TECHNIQUE: Contiguous axial images were obtained from the base of the skull through the vertex without intravenous contrast. COMPARISON:  Brain CT  08/21/2013 FINDINGS: Ventricles and sulci are prominent compatible with atrophy. Extensive periventricular and subcortical white matter hypodensity compatible with chronic small vessel ischemic changes. Old bilateral basal ganglia lacunar infarcts. No evidence for acute cortically based infarct, intracranial hemorrhage or mass effect. Unchanged 18 mm ossified mass within the subfrontal region on the right adjacent to the falx. Orbits are unremarkable. Paranasal sinuses are well aerated. Mastoid air cells are unremarkable. Calvarium is intact. IMPRESSION: No acute intracranial process. Chronic small vessel ischemic changes cortical atrophy. Stable probable calcified meningioma within the frontal lobe adjacent to the falx. Electronically Signed   By: Lovey Newcomer M.D.   On: 03/05/2015 14:38   Dg Chest Port 1 View  03/05/2015  CLINICAL DATA:  Nursing home patient with 24 hour duration weakness and low oxygen saturation. EXAM: PORTABLE CHEST 1 VIEW COMPARISON:  07/24/2013 FINDINGS: Dual lead pacemaker appears unchanged. There is chronic cardiomegaly with left ventricular prominence. There is newly seen venous hypertension with early interstitial edema. Chronic mitral valve calcification and calcified left hilar nodes. No effusions. Old traumatic changes in the region of the left shoulder. IMPRESSION: Cardiomegaly with left ventricular prominence. Venous hypertension with early interstitial edema. Electronically Signed   By: Nelson Chimes M.D.   On: 03/05/2015 13:49    ASSESSMENT/PLAN:  Generalized weakness - for home health PT, OT and home health aide  Pneumonia - Resolved  Hypertension - well controlled; on no medication  Hyperlipidemia - continue Lipitor 10 mg 1 tab by mouth daily  Diabetes mellitus, type II - hemoglobin A1c 6.0; well controlled; continue metformin 500 mg 24 hour 1 tab by mouth daily; check BMP in 1 week  Dementia without behavioral disturbance - continue Aricept 10 mg 1 tab by mouth  daily at bedtime; continue supportive care  Constipation - continue MiraLAX 17 g by mouth daily when necessary, Dulcolax 10 mg 1 suppository rectally daily when necessary and Colace 100 mg 1 capsule by mouth twice a day       I have filled out patient's discharge paperwork and written prescriptions.  Patient will receive home health PT, OT and Home health aide..   Total discharge time :  Less than 30 minutes  Discharge time involved coordination of the discharge process with social worker, nursing staff and therapy department.  Medical justification for home health services verified.     [facility], NP [employer] [phone]

## 2015-04-04 ENCOUNTER — Telehealth: Payer: Self-pay | Admitting: Family Medicine

## 2015-04-04 DIAGNOSIS — R531 Weakness: Secondary | ICD-10-CM | POA: Diagnosis not present

## 2015-04-04 DIAGNOSIS — I10 Essential (primary) hypertension: Secondary | ICD-10-CM | POA: Diagnosis not present

## 2015-04-04 DIAGNOSIS — Z950 Presence of cardiac pacemaker: Secondary | ICD-10-CM | POA: Diagnosis not present

## 2015-04-04 DIAGNOSIS — Z8701 Personal history of pneumonia (recurrent): Secondary | ICD-10-CM | POA: Diagnosis not present

## 2015-04-04 DIAGNOSIS — E1140 Type 2 diabetes mellitus with diabetic neuropathy, unspecified: Secondary | ICD-10-CM | POA: Diagnosis not present

## 2015-04-04 DIAGNOSIS — F0390 Unspecified dementia without behavioral disturbance: Secondary | ICD-10-CM | POA: Diagnosis not present

## 2015-04-04 NOTE — Telephone Encounter (Signed)
I am happy to give this verbal order but they have to know I have not seen her since May so I have not face to face but I can see she has been in nursing home so I know she needs requested services.

## 2015-04-04 NOTE — Telephone Encounter (Signed)
Caller name: Clovis Cao with [employer] Can be reached: [phone]  Reason for call: Needing verbal authorization for [facility] aide 1-2 x week for 7 weeks PT 2x week for 9 weeks OK to leave detailed VM for orders.

## 2015-04-05 NOTE — Telephone Encounter (Signed)
Called left a detailed message of PCP's verbal ok .

## 2015-04-07 DIAGNOSIS — F0390 Unspecified dementia without behavioral disturbance: Secondary | ICD-10-CM | POA: Diagnosis not present

## 2015-04-07 DIAGNOSIS — E1140 Type 2 diabetes mellitus with diabetic neuropathy, unspecified: Secondary | ICD-10-CM | POA: Diagnosis not present

## 2015-04-07 DIAGNOSIS — I10 Essential (primary) hypertension: Secondary | ICD-10-CM | POA: Diagnosis not present

## 2015-04-07 DIAGNOSIS — Z8701 Personal history of pneumonia (recurrent): Secondary | ICD-10-CM | POA: Diagnosis not present

## 2015-04-07 DIAGNOSIS — R531 Weakness: Secondary | ICD-10-CM | POA: Diagnosis not present

## 2015-04-07 DIAGNOSIS — Z950 Presence of cardiac pacemaker: Secondary | ICD-10-CM | POA: Diagnosis not present

## 2015-04-12 ENCOUNTER — Ambulatory Visit: Payer: Medicare Other

## 2015-04-12 DIAGNOSIS — E1140 Type 2 diabetes mellitus with diabetic neuropathy, unspecified: Secondary | ICD-10-CM | POA: Diagnosis not present

## 2015-04-12 DIAGNOSIS — R531 Weakness: Secondary | ICD-10-CM | POA: Diagnosis not present

## 2015-04-12 DIAGNOSIS — Z950 Presence of cardiac pacemaker: Secondary | ICD-10-CM | POA: Diagnosis not present

## 2015-04-12 DIAGNOSIS — I10 Essential (primary) hypertension: Secondary | ICD-10-CM | POA: Diagnosis not present

## 2015-04-12 DIAGNOSIS — Z8701 Personal history of pneumonia (recurrent): Secondary | ICD-10-CM | POA: Diagnosis not present

## 2015-04-12 DIAGNOSIS — F0390 Unspecified dementia without behavioral disturbance: Secondary | ICD-10-CM | POA: Diagnosis not present

## 2015-04-15 DIAGNOSIS — F0390 Unspecified dementia without behavioral disturbance: Secondary | ICD-10-CM | POA: Diagnosis not present

## 2015-04-15 DIAGNOSIS — Z950 Presence of cardiac pacemaker: Secondary | ICD-10-CM | POA: Diagnosis not present

## 2015-04-15 DIAGNOSIS — E1140 Type 2 diabetes mellitus with diabetic neuropathy, unspecified: Secondary | ICD-10-CM | POA: Diagnosis not present

## 2015-04-15 DIAGNOSIS — I10 Essential (primary) hypertension: Secondary | ICD-10-CM | POA: Diagnosis not present

## 2015-04-15 DIAGNOSIS — Z8701 Personal history of pneumonia (recurrent): Secondary | ICD-10-CM | POA: Diagnosis not present

## 2015-04-15 DIAGNOSIS — R531 Weakness: Secondary | ICD-10-CM | POA: Diagnosis not present

## 2015-04-18 DIAGNOSIS — R531 Weakness: Secondary | ICD-10-CM | POA: Diagnosis not present

## 2015-04-18 DIAGNOSIS — E1140 Type 2 diabetes mellitus with diabetic neuropathy, unspecified: Secondary | ICD-10-CM | POA: Diagnosis not present

## 2015-04-18 DIAGNOSIS — I10 Essential (primary) hypertension: Secondary | ICD-10-CM | POA: Diagnosis not present

## 2015-04-18 DIAGNOSIS — Z8701 Personal history of pneumonia (recurrent): Secondary | ICD-10-CM | POA: Diagnosis not present

## 2015-04-18 DIAGNOSIS — F0390 Unspecified dementia without behavioral disturbance: Secondary | ICD-10-CM | POA: Diagnosis not present

## 2015-04-18 DIAGNOSIS — Z950 Presence of cardiac pacemaker: Secondary | ICD-10-CM | POA: Diagnosis not present

## 2015-04-20 DIAGNOSIS — Z8701 Personal history of pneumonia (recurrent): Secondary | ICD-10-CM | POA: Diagnosis not present

## 2015-04-20 DIAGNOSIS — R531 Weakness: Secondary | ICD-10-CM | POA: Diagnosis not present

## 2015-04-20 DIAGNOSIS — E1140 Type 2 diabetes mellitus with diabetic neuropathy, unspecified: Secondary | ICD-10-CM | POA: Diagnosis not present

## 2015-04-20 DIAGNOSIS — I10 Essential (primary) hypertension: Secondary | ICD-10-CM | POA: Diagnosis not present

## 2015-04-20 DIAGNOSIS — F0390 Unspecified dementia without behavioral disturbance: Secondary | ICD-10-CM | POA: Diagnosis not present

## 2015-04-20 DIAGNOSIS — Z950 Presence of cardiac pacemaker: Secondary | ICD-10-CM | POA: Diagnosis not present

## 2015-04-25 ENCOUNTER — Telehealth: Payer: Self-pay

## 2015-04-25 NOTE — Telephone Encounter (Signed)
Pre-Visit Call Made. Message for call back left.

## 2015-04-26 ENCOUNTER — Ambulatory Visit (HOSPITAL_BASED_OUTPATIENT_CLINIC_OR_DEPARTMENT_OTHER)
Admission: RE | Admit: 2015-04-26 | Discharge: 2015-04-26 | Disposition: A | Discharge status: Home / Self Care | Payer: Medicare Other | Source: Ambulatory Visit | Attending: Family Medicine | Admitting: Family Medicine

## 2015-04-26 ENCOUNTER — Ambulatory Visit (INDEPENDENT_AMBULATORY_CARE_PROVIDER_SITE_OTHER): Payer: Medicare Other | Admitting: Family Medicine

## 2015-04-26 ENCOUNTER — Encounter: Payer: Self-pay | Admitting: Family Medicine

## 2015-04-26 VITALS — BP 130/76 | HR 68 | Temp 97.8°F | Ht 60.0 in | Wt 105.2 lb

## 2015-04-26 DIAGNOSIS — J8410 Pulmonary fibrosis, unspecified: Secondary | ICD-10-CM | POA: Diagnosis not present

## 2015-04-26 DIAGNOSIS — E119 Type 2 diabetes mellitus without complications: Secondary | ICD-10-CM

## 2015-04-26 DIAGNOSIS — I10 Essential (primary) hypertension: Secondary | ICD-10-CM | POA: Diagnosis not present

## 2015-04-26 DIAGNOSIS — R109 Unspecified abdominal pain: Secondary | ICD-10-CM

## 2015-04-26 DIAGNOSIS — E785 Hyperlipidemia, unspecified: Secondary | ICD-10-CM

## 2015-04-26 DIAGNOSIS — M8008XA Age-related osteoporosis with current pathological fracture, vertebra(e), initial encounter for fracture: Secondary | ICD-10-CM | POA: Insufficient documentation

## 2015-04-26 DIAGNOSIS — R05 Cough: Secondary | ICD-10-CM | POA: Diagnosis not present

## 2015-04-26 DIAGNOSIS — R8290 Unspecified abnormal findings in urine: Secondary | ICD-10-CM | POA: Diagnosis not present

## 2015-04-26 LAB — URINALYSIS, ROUTINE W REFLEX MICROSCOPIC
Bilirubin Urine: NEGATIVE
Leukocytes, UA: NEGATIVE
Nitrite: NEGATIVE
PH: 6 (ref 5.0–8.0)
RBC / HPF: NONE SEEN (ref 0–?)
SPECIFIC GRAVITY, URINE: 1.025 (ref 1.000–1.030)
TOTAL PROTEIN, URINE-UPE24: 30 — AB
UROBILINOGEN UA: 1 (ref 0.0–1.0)
Urine Glucose: NEGATIVE

## 2015-04-26 LAB — COMPREHENSIVE METABOLIC PANEL
ALBUMIN: 3.9 g/dL (ref 3.5–5.2)
ALT: 7 U/L (ref 0–35)
AST: 15 U/L (ref 0–37)
Alkaline Phosphatase: 62 U/L (ref 39–117)
BUN: 24 mg/dL — AB (ref 6–23)
CHLORIDE: 107 meq/L (ref 96–112)
CO2: 30 meq/L (ref 19–32)
CREATININE: 0.95 mg/dL (ref 0.40–1.20)
Calcium: 9.7 mg/dL (ref 8.4–10.5)
GFR: 59.16 mL/min — ABNORMAL LOW (ref >60.00)
Glucose, Bld: 108 mg/dL — ABNORMAL HIGH (ref 70–99)
POTASSIUM: 3.5 meq/L (ref 3.5–5.1)
SODIUM: 145 meq/L (ref 135–145)
Total Bilirubin: 1 mg/dL (ref 0.2–1.2)
Total Protein: 6.6 g/dL (ref 6.0–8.3)

## 2015-04-26 LAB — CBC
HEMATOCRIT: 40.7 % (ref 36.0–46.0)
Hemoglobin: 13.1 g/dL (ref 12.0–15.0)
MCHC: 32.3 g/dL (ref 30.0–36.0)
MCV: 89.3 fl (ref 78.0–100.0)
Platelets: 260 10*3/uL (ref 150.0–400.0)
RBC: 4.55 Mil/uL (ref 3.87–5.11)
RDW: 15.3 % (ref 11.5–15.5)
WBC: 8.3 10*3/uL (ref 4.0–10.5)

## 2015-04-26 LAB — HEMOGLOBIN A1C: HEMOGLOBIN A1C: 5.5 % (ref 4.6–6.5)

## 2015-04-26 LAB — TSH: TSH: 2.08 u[IU]/mL (ref 0.35–4.50)

## 2015-04-26 MED ORDER — ATORVASTATIN CALCIUM 10 MG PO TABS: ORAL_TABLET | ORAL | Status: DC

## 2015-04-26 MED ORDER — METFORMIN HCL ER 500 MG PO TB24: ORAL_TABLET | ORAL | Status: DC

## 2015-04-26 MED ORDER — DONEPEZIL HCL 10 MG PO TABS: 10.0000 mg | ORAL_TABLET | Freq: Every day | ORAL | Status: DC

## 2015-04-26 NOTE — Patient Instructions (Signed)

## 2015-04-27 ENCOUNTER — Telehealth: Payer: Self-pay | Admitting: *Deleted

## 2015-04-27 DIAGNOSIS — I10 Essential (primary) hypertension: Secondary | ICD-10-CM | POA: Diagnosis not present

## 2015-04-27 DIAGNOSIS — F0390 Unspecified dementia without behavioral disturbance: Secondary | ICD-10-CM | POA: Diagnosis not present

## 2015-04-27 DIAGNOSIS — Z950 Presence of cardiac pacemaker: Secondary | ICD-10-CM | POA: Diagnosis not present

## 2015-04-27 DIAGNOSIS — Z8701 Personal history of pneumonia (recurrent): Secondary | ICD-10-CM | POA: Diagnosis not present

## 2015-04-27 DIAGNOSIS — E1140 Type 2 diabetes mellitus with diabetic neuropathy, unspecified: Secondary | ICD-10-CM | POA: Diagnosis not present

## 2015-04-27 DIAGNOSIS — R531 Weakness: Secondary | ICD-10-CM | POA: Diagnosis not present

## 2015-04-27 NOTE — Telephone Encounter (Signed)
Forwarded to Dr. Blyth. JG//CMA  

## 2015-04-28 LAB — URINE CULTURE: Colony Count: 50000

## 2015-04-29 ENCOUNTER — Other Ambulatory Visit: Payer: Self-pay | Admitting: Family Medicine

## 2015-04-29 DIAGNOSIS — R531 Weakness: Secondary | ICD-10-CM | POA: Diagnosis not present

## 2015-04-29 DIAGNOSIS — E1140 Type 2 diabetes mellitus with diabetic neuropathy, unspecified: Secondary | ICD-10-CM | POA: Diagnosis not present

## 2015-04-29 DIAGNOSIS — I10 Essential (primary) hypertension: Secondary | ICD-10-CM | POA: Diagnosis not present

## 2015-04-29 DIAGNOSIS — Z8701 Personal history of pneumonia (recurrent): Secondary | ICD-10-CM | POA: Diagnosis not present

## 2015-04-29 DIAGNOSIS — F0390 Unspecified dementia without behavioral disturbance: Secondary | ICD-10-CM | POA: Diagnosis not present

## 2015-04-29 DIAGNOSIS — Z950 Presence of cardiac pacemaker: Secondary | ICD-10-CM | POA: Diagnosis not present

## 2015-04-29 MED ORDER — CEFDINIR 300 MG PO CAPS: 300.0000 mg | ORAL_CAPSULE | Freq: Two times a day (BID) | ORAL | Status: DC

## 2015-04-30 ENCOUNTER — Encounter: Payer: Self-pay | Admitting: Family Medicine

## 2015-04-30 NOTE — Progress Notes (Signed)
Subjective:    Patient ID: Briana Thornton, female    DOB: 1927/01/25, 80 y.o.   MRN: [id number]  Chief Complaint  Patient presents with  . Follow-up    HPI Patient is in today for follow-up. Feeling well. Does have a skin tear on her hand but it is not painful. There was no fall or major injury. No recent illness. No acute concerns. Denies CP/palp/SOB/HA/congestion/fevers/GI or GU c/o. Taking meds as prescribed  Past Medical History  Diagnosis Date  . Chicken pox as a child  . Measles as a child  . IBS (irritable bowel syndrome)   . Diabetes mellitus 47    type 2  . Hyperlipidemia 72  . Hypertension 72  . Vision loss of right eye   . Vertigo 2010    benign  . Calcification of cartilage     ear  . Cardiovascular system disease 07/02/2011  . Hemorrhoid 07/02/2011  . Osteoporosis 07/02/2011  . Weight loss 07/31/2011  . Anemia 05/22/2012  . Broken wrist 10-22-11    left  . Depression 05/22/2012  . Dermatitis 05/22/2012    Right neck    Past Surgical History  Procedure Laterality Date  . Hemorroidectomy  1979  . Knee scoped  2002    right  . Rotator cuff repair  2004    right  . Abdominal hysterectomy  2008    partial still has ovaries  . Lens implant left  2006  . Pacemaker insertion  2010  . Wrist surgery  11-05-11    left wrist  . Intramedullary (im) nail intertrochanteric Right 07/24/2013    Procedure: INTRAMEDULLARY (IM) NAIL INTERTROCHANTRIC;  Surgeon: Gearlean Alf, MD;  Location: [facility];  Service: Orthopedics;  Laterality: Right;    Family History  Problem Relation Age of Onset  . Diabetes Mother     type 2  . Hypertension Mother   . Cancer Brother     lung-smoker  . Diabetes Daughter     pre diabetic  . Diabetes Son     type 2  . Cancer Maternal Grandfather     prostate    Social History   Social History  . Marital Status: Widowed    Spouse Name: N/A  . Number of Children: N/A  . Years of Education: N/A   Occupational History  . Not on file.     Social History Main Topics  . Smoking status: Never Smoker   . Smokeless tobacco: Never Used  . Alcohol Use: No  . Drug Use: No  . Sexual Activity: No   Other Topics Concern  . Not on file   Social History Narrative    Outpatient Prescriptions Prior to Visit  Medication Sig Dispense Refill  . acetaminophen (TYLENOL) 325 MG tablet Take 2 tablets (650 mg total) by mouth every 6 (six) hours as needed for mild pain (or Fever >/= 101). 60 tablet 0  . alum & mag hydroxide-simeth (MAALOX/MYLANTA) 200-200-20 MG/5ML suspension Take 30 mLs by mouth every 6 (six) hours as needed for indigestion or heartburn (dyspepsia). 355 mL 0  . bisacodyl (DULCOLAX) 10 MG suppository Place 1 suppository (10 mg total) rectally daily as needed for moderate constipation. 12 suppository 0  . docusate sodium (COLACE) 100 MG capsule Take 100 mg by mouth 2 (two) times daily.     . methylcellulose (ARTIFICIAL TEARS) 1 % ophthalmic solution Place 1 drop into both eyes as needed (dry eyes).     . Multiple Vitamin (MULTIVITAMIN WITH  MINERALS) TABS tablet Take 1 tablet by mouth daily.     . polyethylene glycol (MIRALAX / GLYCOLAX) packet Take 17 g by mouth daily as needed for mild constipation. 14 each 0  . Probiotic Product (PROBIOTIC DAILY PO) Take 1 tablet by mouth daily.    Marland Kitchen atorvastatin (LIPITOR) 10 MG tablet TAKE 1 TABLET (10 MG TOTAL) BY MOUTH DAILY. 90 tablet 3  . donepezil (ARICEPT) 10 MG tablet Take 1 tablet (10 mg total) by mouth at bedtime. 90 tablet 3  . metFORMIN (GLUCOPHAGE-XR) 500 MG 24 hr tablet TAKE 1 TABLET (500 MG TOTAL) BY MOUTH DAILY WITH BREAKFAST. 90 tablet 3  . Ipratropium-Albuterol (COMBIVENT RESPIMAT) 20-100 MCG/ACT AERS respimat Inhale 1 puff into the lungs every 6 (six) hours as needed for wheezing or shortness of breath. (Patient not taking: Reported on 04/26/2015)    . guaiFENesin (MUCINEX) 600 MG 12 hr tablet Take 1 tablet (600 mg total) by mouth 2 (two) times daily.     No  facility-administered medications prior to visit.    No Known Allergies  Review of Systems  Constitutional: Negative for fever and malaise/fatigue.  HENT: Negative for congestion.   Eyes: Negative for discharge.  Respiratory: Negative for shortness of breath.   Cardiovascular: Negative for chest pain, palpitations and leg swelling.  Gastrointestinal: Negative for nausea and abdominal pain.  Genitourinary: Negative for dysuria.  Musculoskeletal: Negative for falls.  Skin: Negative for rash.  Neurological: Negative for loss of consciousness and headaches.  Endo/Heme/Allergies: Negative for environmental allergies.  Psychiatric/Behavioral: Negative for depression. The patient is not nervous/anxious.        Objective:    Physical Exam  Constitutional: She is oriented to person, place, and time. She appears well-developed and well-nourished. No distress.  HENT:  Head: Normocephalic and atraumatic.  Nose: Nose normal.  Eyes: Right eye exhibits no discharge. Left eye exhibits no discharge.  Neck: Normal range of motion. Neck supple.  Cardiovascular: Normal rate and regular rhythm.   No murmur heard. Pulmonary/Chest: Effort normal and breath sounds normal.  Abdominal: Soft. Bowel sounds are normal. There is no tenderness.  Musculoskeletal: She exhibits no edema.  Neurological: She is alert and oriented to person, place, and time.  Skin: Skin is warm and dry.  Skin tear on hand. No surrounding erythema or fluctuance  Psychiatric: She has a normal mood and affect.  Nursing note and vitals reviewed.   BP 130/76 mmHg  Pulse 68  Temp(Src) 97.8 F (36.6 C) (Oral)  Ht 5' (1.524 m)  Wt 105 lb 4 oz (47.741 kg)  BMI 20.56 kg/m2  SpO2 99% Wt Readings from Last 3 Encounters:  04/26/15 105 lb 4 oz (47.741 kg)  03/31/15 111 lb 12.8 oz (50.712 kg)  03/09/15 111 lb 6.4 oz (50.531 kg)     Lab Results  Component Value Date   WBC 8.3 04/26/2015   HGB 13.1 04/26/2015   HCT 40.7  04/26/2015   PLT 260.0 04/26/2015   GLUCOSE 108* 04/26/2015   CHOL 141 10/21/2014   TRIG 46.0 10/21/2014   HDL 74.60 10/21/2014   LDLDIRECT 121.9 07/02/2011   LDLCALC 57 10/21/2014   ALT 7 04/26/2015   AST 15 04/26/2015   NA 145 04/26/2015   K 3.5 04/26/2015   CL 107 04/26/2015   CREATININE 0.95 04/26/2015   BUN 24* 04/26/2015   CO2 30 04/26/2015   TSH 2.08 04/26/2015   INR 1.02 07/24/2013   HGBA1C 5.5 04/26/2015   MICROALBUR 0.50 07/02/2011  Lab Results  Component Value Date   TSH 2.08 04/26/2015   Lab Results  Component Value Date   WBC 8.3 04/26/2015   HGB 13.1 04/26/2015   HCT 40.7 04/26/2015   MCV 89.3 04/26/2015   PLT 260.0 04/26/2015   Lab Results  Component Value Date   NA 145 04/26/2015   K 3.5 04/26/2015   CO2 30 04/26/2015   GLUCOSE 108* 04/26/2015   BUN 24* 04/26/2015   CREATININE 0.95 04/26/2015   BILITOT 1.0 04/26/2015   ALKPHOS 62 04/26/2015   AST 15 04/26/2015   ALT 7 04/26/2015   PROT 6.6 04/26/2015   ALBUMIN 3.9 04/26/2015   CALCIUM 9.7 04/26/2015   ANIONGAP 5 03/07/2015   GFR 59.16* 04/26/2015   Lab Results  Component Value Date   CHOL 141 10/21/2014   Lab Results  Component Value Date   HDL 74.60 10/21/2014   Lab Results  Component Value Date   LDLCALC 57 10/21/2014   Lab Results  Component Value Date   TRIG 46.0 10/21/2014   Lab Results  Component Value Date   CHOLHDL 2 10/21/2014   Lab Results  Component Value Date   HGBA1C 5.5 04/26/2015       Assessment & Plan:   Problem List Items Addressed This Visit    Abnormal finding on urinalysis    And mildly symptomatic, started on Cefdinir      DM (diabetes mellitus) ([place])     hgba1c acceptable, minimize simple carbs. Increase exercise as tolerated. Continue current meds      Relevant Medications   metFORMIN (GLUCOPHAGE-XR) 500 MG 24 hr tablet   atorvastatin (LIPITOR) 10 MG tablet   Other Relevant Orders   TSH (Completed)   CBC (Completed)    Comprehensive metabolic panel (Completed)   Urinalysis   Urine culture (Completed)   Hemoglobin A1c (Completed)   DG Chest 2 View (Completed)   Hyperlipidemia    Encouraged heart healthy diet, increase exercise, avoid trans fats, consider a krill oil cap daily      Relevant Medications   atorvastatin (LIPITOR) 10 MG tablet   Other Relevant Orders   TSH (Completed)   CBC (Completed)   Comprehensive metabolic panel (Completed)   Urinalysis   Urine culture (Completed)   Hemoglobin A1c (Completed)   DG Chest 2 View (Completed)   Hypertension - Primary    Well controlled, no changes to meds. Encouraged heart healthy diet such as the DASH diet and exercise as tolerated.       Relevant Medications   atorvastatin (LIPITOR) 10 MG tablet   Other Relevant Orders   TSH (Completed)   CBC (Completed)   Comprehensive metabolic panel (Completed)   Urinalysis   Urine culture (Completed)   Hemoglobin A1c (Completed)   DG Chest 2 View (Completed)    Other Visit Diagnoses    Abdominal pain, unspecified abdominal location        Relevant Orders    Urinalysis    Urine culture (Completed)       I have discontinued Ms. Magro's guaiFENesin. I am also having her maintain her Probiotic Product (PROBIOTIC DAILY PO), multivitamin with minerals, methylcellulose, acetaminophen, alum & mag hydroxide-simeth, bisacodyl, polyethylene glycol, docusate sodium, Ipratropium-Albuterol, metFORMIN, atorvastatin, and donepezil.  Meds ordered this encounter  Medications  . metFORMIN (GLUCOPHAGE-XR) 500 MG 24 hr tablet    Sig: TAKE 1 TABLET (500 MG TOTAL) BY MOUTH DAILY WITH BREAKFAST.    Dispense:  90 tablet    Refill:  3    D/C PREVIOUS SCRIPTS FOR THIS MEDICATION  . atorvastatin (LIPITOR) 10 MG tablet    Sig: TAKE 1 TABLET (10 MG TOTAL) BY MOUTH DAILY.    Dispense:  90 tablet    Refill:  3    D/C PREVIOUS SCRIPTS FOR THIS MEDICATION  . donepezil (ARICEPT) 10 MG tablet    Sig: Take 1 tablet (10 mg total)  by mouth at bedtime.    Dispense:  90 tablet    Refill:  3    D/C PREVIOUS SCRIPTS FOR THIS MEDICATION     Penni Homans, MD

## 2015-04-30 NOTE — Assessment & Plan Note (Addendum)
hgba1c acceptable, minimize simple carbs. Increase exercise as tolerated. Continue current meds 

## 2015-04-30 NOTE — Assessment & Plan Note (Signed)
And mildly symptomatic, started on Cefdinir

## 2015-04-30 NOTE — Assessment & Plan Note (Signed)
Well controlled, no changes to meds. Encouraged heart healthy diet such as the DASH diet and exercise as tolerated.  °

## 2015-04-30 NOTE — Assessment & Plan Note (Signed)
Encouraged heart healthy diet, increase exercise, avoid trans fats, consider a krill oil cap daily 

## 2015-05-04 DIAGNOSIS — Z950 Presence of cardiac pacemaker: Secondary | ICD-10-CM | POA: Diagnosis not present

## 2015-05-04 DIAGNOSIS — I10 Essential (primary) hypertension: Secondary | ICD-10-CM | POA: Diagnosis not present

## 2015-05-04 DIAGNOSIS — Z8701 Personal history of pneumonia (recurrent): Secondary | ICD-10-CM | POA: Diagnosis not present

## 2015-05-04 DIAGNOSIS — E1140 Type 2 diabetes mellitus with diabetic neuropathy, unspecified: Secondary | ICD-10-CM | POA: Diagnosis not present

## 2015-05-04 DIAGNOSIS — F0390 Unspecified dementia without behavioral disturbance: Secondary | ICD-10-CM | POA: Diagnosis not present

## 2015-05-04 DIAGNOSIS — R531 Weakness: Secondary | ICD-10-CM | POA: Diagnosis not present

## 2015-05-06 DIAGNOSIS — F0390 Unspecified dementia without behavioral disturbance: Secondary | ICD-10-CM | POA: Diagnosis not present

## 2015-05-06 DIAGNOSIS — Z8701 Personal history of pneumonia (recurrent): Secondary | ICD-10-CM | POA: Diagnosis not present

## 2015-05-06 DIAGNOSIS — E1140 Type 2 diabetes mellitus with diabetic neuropathy, unspecified: Secondary | ICD-10-CM | POA: Diagnosis not present

## 2015-05-06 DIAGNOSIS — I10 Essential (primary) hypertension: Secondary | ICD-10-CM | POA: Diagnosis not present

## 2015-05-06 DIAGNOSIS — Z950 Presence of cardiac pacemaker: Secondary | ICD-10-CM | POA: Diagnosis not present

## 2015-05-06 DIAGNOSIS — R531 Weakness: Secondary | ICD-10-CM | POA: Diagnosis not present

## 2015-05-11 DIAGNOSIS — R531 Weakness: Secondary | ICD-10-CM | POA: Diagnosis not present

## 2015-05-11 DIAGNOSIS — I10 Essential (primary) hypertension: Secondary | ICD-10-CM | POA: Diagnosis not present

## 2015-05-11 DIAGNOSIS — E1140 Type 2 diabetes mellitus with diabetic neuropathy, unspecified: Secondary | ICD-10-CM | POA: Diagnosis not present

## 2015-05-11 DIAGNOSIS — Z950 Presence of cardiac pacemaker: Secondary | ICD-10-CM | POA: Diagnosis not present

## 2015-05-11 DIAGNOSIS — F0390 Unspecified dementia without behavioral disturbance: Secondary | ICD-10-CM | POA: Diagnosis not present

## 2015-05-11 DIAGNOSIS — Z8701 Personal history of pneumonia (recurrent): Secondary | ICD-10-CM | POA: Diagnosis not present

## 2015-05-12 NOTE — Telephone Encounter (Signed)
Signed forms faxed to [employer] successfully. Sent for scanning. JG//CMA  

## 2015-05-13 DIAGNOSIS — F0390 Unspecified dementia without behavioral disturbance: Secondary | ICD-10-CM | POA: Diagnosis not present

## 2015-05-13 DIAGNOSIS — Z950 Presence of cardiac pacemaker: Secondary | ICD-10-CM | POA: Diagnosis not present

## 2015-05-13 DIAGNOSIS — Z8701 Personal history of pneumonia (recurrent): Secondary | ICD-10-CM | POA: Diagnosis not present

## 2015-05-13 DIAGNOSIS — I10 Essential (primary) hypertension: Secondary | ICD-10-CM | POA: Diagnosis not present

## 2015-05-13 DIAGNOSIS — R531 Weakness: Secondary | ICD-10-CM | POA: Diagnosis not present

## 2015-05-13 DIAGNOSIS — E1140 Type 2 diabetes mellitus with diabetic neuropathy, unspecified: Secondary | ICD-10-CM | POA: Diagnosis not present

## 2015-05-16 ENCOUNTER — Telehealth: Payer: Self-pay | Admitting: Family Medicine

## 2015-05-16 ENCOUNTER — Inpatient Hospital Stay (HOSPITAL_COMMUNITY)
Admission: EM | Admit: 2015-05-16 | Discharge: 2015-05-18 | DRG: 069 | Disposition: A | Discharge status: Rehabilitation Facility | Payer: Medicare Other | Attending: Internal Medicine | Admitting: Internal Medicine

## 2015-05-16 ENCOUNTER — Emergency Department (HOSPITAL_COMMUNITY): Payer: Medicare Other

## 2015-05-16 ENCOUNTER — Encounter (HOSPITAL_COMMUNITY): Payer: Self-pay | Admitting: Emergency Medicine

## 2015-05-16 DIAGNOSIS — I10 Essential (primary) hypertension: Secondary | ICD-10-CM | POA: Diagnosis present

## 2015-05-16 DIAGNOSIS — R269 Unspecified abnormalities of gait and mobility: Secondary | ICD-10-CM | POA: Diagnosis not present

## 2015-05-16 DIAGNOSIS — Z801 Family history of malignant neoplasm of trachea, bronchus and lung: Secondary | ICD-10-CM | POA: Diagnosis not present

## 2015-05-16 DIAGNOSIS — F0390 Unspecified dementia without behavioral disturbance: Secondary | ICD-10-CM | POA: Diagnosis not present

## 2015-05-16 DIAGNOSIS — D649 Anemia, unspecified: Secondary | ICD-10-CM | POA: Diagnosis present

## 2015-05-16 DIAGNOSIS — I739 Peripheral vascular disease, unspecified: Secondary | ICD-10-CM | POA: Diagnosis not present

## 2015-05-16 DIAGNOSIS — I639 Cerebral infarction, unspecified: Secondary | ICD-10-CM | POA: Diagnosis present

## 2015-05-16 DIAGNOSIS — H5461 Unqualified visual loss, right eye, normal vision left eye: Secondary | ICD-10-CM | POA: Diagnosis present

## 2015-05-16 DIAGNOSIS — E440 Moderate protein-calorie malnutrition: Secondary | ICD-10-CM | POA: Insufficient documentation

## 2015-05-16 DIAGNOSIS — R630 Anorexia: Secondary | ICD-10-CM | POA: Diagnosis not present

## 2015-05-16 DIAGNOSIS — R531 Weakness: Secondary | ICD-10-CM | POA: Diagnosis not present

## 2015-05-16 DIAGNOSIS — Z7984 Long term (current) use of oral hypoglycemic drugs: Secondary | ICD-10-CM | POA: Diagnosis not present

## 2015-05-16 DIAGNOSIS — I6503 Occlusion and stenosis of bilateral vertebral arteries: Secondary | ICD-10-CM | POA: Diagnosis not present

## 2015-05-16 DIAGNOSIS — I6350 Cerebral infarction due to unspecified occlusion or stenosis of unspecified cerebral artery: Secondary | ICD-10-CM | POA: Diagnosis not present

## 2015-05-16 DIAGNOSIS — Z833 Family history of diabetes mellitus: Secondary | ICD-10-CM

## 2015-05-16 DIAGNOSIS — E785 Hyperlipidemia, unspecified: Secondary | ICD-10-CM | POA: Diagnosis not present

## 2015-05-16 DIAGNOSIS — F329 Major depressive disorder, single episode, unspecified: Secondary | ICD-10-CM | POA: Diagnosis present

## 2015-05-16 DIAGNOSIS — Z8042 Family history of malignant neoplasm of prostate: Secondary | ICD-10-CM | POA: Diagnosis not present

## 2015-05-16 DIAGNOSIS — D320 Benign neoplasm of cerebral meninges: Secondary | ICD-10-CM | POA: Diagnosis present

## 2015-05-16 DIAGNOSIS — G8191 Hemiplegia, unspecified affecting right dominant side: Secondary | ICD-10-CM | POA: Diagnosis present

## 2015-05-16 DIAGNOSIS — E1142 Type 2 diabetes mellitus with diabetic polyneuropathy: Secondary | ICD-10-CM | POA: Diagnosis present

## 2015-05-16 DIAGNOSIS — E1121 Type 2 diabetes mellitus with diabetic nephropathy: Secondary | ICD-10-CM

## 2015-05-16 DIAGNOSIS — Z8249 Family history of ischemic heart disease and other diseases of the circulatory system: Secondary | ICD-10-CM

## 2015-05-16 DIAGNOSIS — G459 Transient cerebral ischemic attack, unspecified: Principal | ICD-10-CM

## 2015-05-16 DIAGNOSIS — Z90710 Acquired absence of both cervix and uterus: Secondary | ICD-10-CM

## 2015-05-16 DIAGNOSIS — E46 Unspecified protein-calorie malnutrition: Secondary | ICD-10-CM | POA: Diagnosis not present

## 2015-05-16 DIAGNOSIS — R1310 Dysphagia, unspecified: Secondary | ICD-10-CM | POA: Diagnosis present

## 2015-05-16 DIAGNOSIS — Z66 Do not resuscitate: Secondary | ICD-10-CM | POA: Diagnosis present

## 2015-05-16 DIAGNOSIS — Z950 Presence of cardiac pacemaker: Secondary | ICD-10-CM | POA: Diagnosis present

## 2015-05-16 DIAGNOSIS — I6789 Other cerebrovascular disease: Secondary | ICD-10-CM | POA: Diagnosis not present

## 2015-05-16 LAB — COMPREHENSIVE METABOLIC PANEL
ALBUMIN: 3.7 g/dL (ref 3.5–5.0)
ALBUMIN: 3.2 g/dL — AB (ref 3.5–5.0)
ALK PHOS: 74 U/L (ref 38–126)
ALK PHOS: 71 U/L (ref 38–126)
ALT: 10 U/L — AB (ref 14–54)
ALT: 10 U/L — AB (ref 14–54)
AST: 18 U/L (ref 15–41)
AST: 17 U/L (ref 15–41)
Anion gap: 7 (ref 5–15)
Anion gap: 8 (ref 5–15)
BILIRUBIN TOTAL: 0.8 mg/dL (ref 0.3–1.2)
BUN: 23 mg/dL — AB (ref 6–20)
BUN: 19 mg/dL (ref 6–20)
CALCIUM: 9.2 mg/dL (ref 8.9–10.3)
CALCIUM: 9.1 mg/dL (ref 8.9–10.3)
CO2: 30 mmol/L (ref 22–32)
CO2: 26 mmol/L (ref 22–32)
CREATININE: 0.91 mg/dL (ref 0.44–1.00)
CREATININE: 0.9 mg/dL (ref 0.44–1.00)
Chloride: 107 mmol/L (ref 101–111)
Chloride: 110 mmol/L (ref 101–111)
GFR calc Af Amer: >60 mL/min (ref >60)
GFR calc Af Amer: >60 mL/min (ref >60)
GFR calc non Af Amer: 56 mL/min — ABNORMAL LOW (ref >60)
GFR, EST NON AFRICAN AMERICAN: 56 mL/min — AB (ref >60)
GLUCOSE: 96 mg/dL (ref 65–99)
GLUCOSE: 116 mg/dL — AB (ref 65–99)
Potassium: 4 mmol/L (ref 3.5–5.1)
Potassium: 3.8 mmol/L (ref 3.5–5.1)
SODIUM: 144 mmol/L (ref 135–145)
Sodium: 144 mmol/L (ref 135–145)
TOTAL PROTEIN: 6.6 g/dL (ref 6.5–8.1)
Total Bilirubin: 0.9 mg/dL (ref 0.3–1.2)
Total Protein: 5.9 g/dL — ABNORMAL LOW (ref 6.5–8.1)

## 2015-05-16 LAB — GLUCOSE, CAPILLARY: GLUCOSE-CAPILLARY: 90 mg/dL (ref 65–99)

## 2015-05-16 LAB — URINALYSIS, ROUTINE W REFLEX MICROSCOPIC
Bilirubin Urine: NEGATIVE
GLUCOSE, UA: NEGATIVE mg/dL
Hgb urine dipstick: NEGATIVE
KETONES UR: NEGATIVE mg/dL
LEUKOCYTES UA: NEGATIVE
Nitrite: NEGATIVE
PH: 7.5 (ref 5.0–8.0)
Protein, ur: NEGATIVE mg/dL
SPECIFIC GRAVITY, URINE: 1.014 (ref 1.005–1.030)

## 2015-05-16 LAB — CBC
HCT: 39.6 % (ref 36.0–46.0)
HCT: 37.7 % (ref 36.0–46.0)
Hemoglobin: 12.1 g/dL (ref 12.0–15.0)
Hemoglobin: 11.9 g/dL — ABNORMAL LOW (ref 12.0–15.0)
MCH: 28.4 pg (ref 26.0–34.0)
MCH: 29.3 pg (ref 26.0–34.0)
MCHC: 30.6 g/dL (ref 30.0–36.0)
MCHC: 31.6 g/dL (ref 30.0–36.0)
MCV: 93 fL (ref 78.0–100.0)
MCV: 92.9 fL (ref 78.0–100.0)
PLATELETS: 248 10*3/uL (ref 150–400)
PLATELETS: 219 10*3/uL (ref 150–400)
RBC: 4.26 MIL/uL (ref 3.87–5.11)
RBC: 4.06 MIL/uL (ref 3.87–5.11)
RDW: 14.4 % (ref 11.5–15.5)
RDW: 14.4 % (ref 11.5–15.5)
WBC: 8 10*3/uL (ref 4.0–10.5)
WBC: 8.5 10*3/uL (ref 4.0–10.5)

## 2015-05-16 LAB — I-STAT TROPONIN, ED: TROPONIN I, POC: 0.01 ng/mL (ref 0.00–0.08)

## 2015-05-16 MED ORDER — ASPIRIN 325 MG PO TABS
325.0000 mg | ORAL_TABLET | Freq: Every day | ORAL | Status: DC
  Administered 2015-05-16 – 2015-05-17 (×2): 325 mg via ORAL
  Filled 2015-05-16 (×2): qty 1

## 2015-05-16 MED ORDER — DONEPEZIL HCL 10 MG PO TABS
10.0000 mg | ORAL_TABLET | Freq: Every day | ORAL | Status: DC
  Administered 2015-05-17 – 2015-05-18 (×2): 10 mg via ORAL
  Filled 2015-05-16 (×2): qty 1

## 2015-05-16 MED ORDER — STROKE: EARLY STAGES OF RECOVERY BOOK
Freq: Once | Status: AC
  Administered 2015-05-16: 21:00:00
  Filled 2015-05-16: qty 1

## 2015-05-16 MED ORDER — METFORMIN HCL ER 500 MG PO TB24: 500.0000 mg | ORAL_TABLET | Freq: Every day | ORAL | Status: DC

## 2015-05-16 MED ORDER — ATORVASTATIN CALCIUM 10 MG PO TABS
10.0000 mg | ORAL_TABLET | Freq: Every day | ORAL | Status: DC
  Administered 2015-05-17: 10 mg via ORAL
  Filled 2015-05-16: qty 1

## 2015-05-16 MED ORDER — SODIUM CHLORIDE 0.9 % IV BOLUS (SEPSIS)
500.0000 mL | Freq: Once | INTRAVENOUS | Status: AC
  Administered 2015-05-16: 500 mL via INTRAVENOUS

## 2015-05-16 MED ORDER — ASPIRIN 300 MG RE SUPP: 300.0000 mg | Freq: Every day | RECTAL | Status: DC

## 2015-05-16 MED ORDER — SENNOSIDES-DOCUSATE SODIUM 8.6-50 MG PO TABS: 1.0000 | ORAL_TABLET | Freq: Every evening | ORAL | Status: DC | PRN

## 2015-05-16 MED ORDER — DONEPEZIL HCL 10 MG PO TABS: 10.0000 mg | ORAL_TABLET | Freq: Every day | ORAL | Status: DC

## 2015-05-16 NOTE — ED Notes (Addendum)
Awake. Verbally responsive. A/O x2 with confusion to time. Resp even and unlabored. No audible adventitious breath sounds noted. ABC's intact. SR on monitor. IV infusing NS at 525ml/hr without difficulty. Family at bedside.

## 2015-05-16 NOTE — ED Provider Notes (Signed)
CSN: [id number]     Arrival date & time 05/16/15  1302 History   First MD Initiated Contact with Patient 05/16/15 1429     Chief Complaint  Patient presents with  . numbness in legs      (Consider location/radiation/quality/duration/timing/severity/associated sxs/prior Treatment) HPI Comments: 81 y.o. Female with history of HTN, hyperlipidemia, DM, pacemaker, dysphagia, recent admission for pneumonia presents for weakness.  The patient reported has had increased weakness over the last 2 or so days and has not been able to stand or transport as she usually can.  The patient was discharged after and inpatient admission for pneumonia about 1 month ago but she had been doing well at home until the last 2 days.  No fever, chills, nausea, vomiting, diarrhea, urinary changes.  No falls or injuries.  Patient's family thought initially the patient seemed a little more weak on the right but has been generally weak.  The patient has been complaining of numbness in the bilateral lower extremities.   Past Medical History  Diagnosis Date  . Chicken pox as a child  . Measles as a child  . IBS (irritable bowel syndrome)   . Diabetes mellitus 47    type 2  . Hyperlipidemia 72  . Hypertension 72  . Vision loss of right eye   . Vertigo 2010    benign  . Calcification of cartilage     ear  . Cardiovascular system disease 07/02/2011  . Hemorrhoid 07/02/2011  . Osteoporosis 07/02/2011  . Weight loss 07/31/2011  . Anemia 05/22/2012  . Broken wrist 10-22-11    left  . Depression 05/22/2012  . Dermatitis 05/22/2012    Right neck   Past Surgical History  Procedure Laterality Date  . Hemorroidectomy  1979  . Knee scoped  2002    right  . Rotator cuff repair  2004    right  . Abdominal hysterectomy  2008    partial still has ovaries  . Lens implant left  2006  . Pacemaker insertion  2010  . Wrist surgery  11-05-11    left wrist  . Intramedullary (im) nail intertrochanteric Right 07/24/2013   Procedure: INTRAMEDULLARY (IM) NAIL INTERTROCHANTRIC;  Surgeon: Gearlean Alf, MD;  Location: [facility];  Service: Orthopedics;  Laterality: Right;   Family History  Problem Relation Age of Onset  . Diabetes Mother     type 2  . Hypertension Mother   . Cancer Brother     lung-smoker  . Diabetes Daughter     pre diabetic  . Diabetes Son     type 2  . Cancer Maternal Grandfather     prostate   Social History  Substance Use Topics  . Smoking status: Never Smoker   . Smokeless tobacco: Never Used  . Alcohol Use: No   OB History    No data available     Review of Systems  Constitutional: Positive for fatigue. Negative for appetite change.  HENT: Negative for congestion, postnasal drip and rhinorrhea.   Eyes: Negative for visual disturbance.  Respiratory: Negative for chest tightness and shortness of breath.   Cardiovascular: Negative for chest pain and palpitations.  Gastrointestinal: Negative for nausea, vomiting and abdominal pain.  Genitourinary: Positive for difficulty urinating. Negative for dysuria and hematuria.  Musculoskeletal: Negative for myalgias and back pain.  Skin: Negative for rash.  Neurological: Positive for weakness. Negative for syncope and headaches.      Allergies  Review of patient's allergies indicates no known allergies.  Home Medications   Prior to Admission medications   Medication Sig Start Date End Date Taking? Authorizing Provider  acetaminophen (TYLENOL) 325 MG tablet Take 2 tablets (650 mg total) by mouth every 6 (six) hours as needed for mild pain (or Fever >/= 101). 07/27/13  Yes Arlee Muslim, PA-C  alum & mag hydroxide-simeth (MAALOX/MYLANTA) 200-200-20 MG/5ML suspension Take 30 mLs by mouth every 6 (six) hours as needed for indigestion or heartburn (dyspepsia). 07/27/13  Yes Kelvin Cellar, MD  atorvastatin (LIPITOR) 10 MG tablet TAKE 1 TABLET (10 MG TOTAL) BY MOUTH DAILY. 04/26/15  Yes Mosie Lukes, MD  bisacodyl (DULCOLAX) 10 MG  suppository Place 1 suppository (10 mg total) rectally daily as needed for moderate constipation. 07/27/13  Yes Kelvin Cellar, MD  docusate sodium (COLACE) 100 MG capsule Take 100 mg by mouth every other day.    Yes Historical Provider, MD  donepezil (ARICEPT) 10 MG tablet Take 1 tablet (10 mg total) by mouth at bedtime. Patient taking differently: Take 10 mg by mouth daily.  04/26/15  Yes Mosie Lukes, MD  metFORMIN (GLUCOPHAGE-XR) 500 MG 24 hr tablet TAKE 1 TABLET (500 MG TOTAL) BY MOUTH DAILY WITH BREAKFAST. 04/26/15  Yes Mosie Lukes, MD  methylcellulose (ARTIFICIAL TEARS) 1 % ophthalmic solution Place 1 drop into both eyes as needed (dry eyes).    Yes Historical Provider, MD  Multiple Vitamin (MULTIVITAMIN WITH MINERALS) TABS tablet Take 2 tablets by mouth daily.    Yes Historical Provider, MD  polyethylene glycol (MIRALAX / GLYCOLAX) packet Take 17 g by mouth daily as needed for mild constipation. 07/27/13  Yes Kelvin Cellar, MD  Probiotic Product (PROBIOTIC DAILY PO) Take 1 tablet by mouth daily.   Yes Historical Provider, MD  Ipratropium-Albuterol (COMBIVENT RESPIMAT) 20-100 MCG/ACT AERS respimat Inhale 1 puff into the lungs every 6 (six) hours as needed for wheezing or shortness of breath. Patient not taking: Reported on 04/26/2015 03/08/15   Barton Dubois, MD   BP 175/69 mmHg  Pulse 66  Temp(Src) 98 F (36.7 C) (Oral)  Resp 17  SpO2 98% Physical Exam  Constitutional: She is oriented to person, place, and time. She appears well-developed and well-nourished. No distress.  HENT:  Head: Normocephalic and atraumatic.  Right Ear: External ear normal.  Left Ear: External ear normal.  Nose: Nose normal.  Mouth/Throat: Oropharynx is clear and moist. No oropharyngeal exudate.  Eyes: EOM are normal. Pupils are equal, round, and reactive to light.  Neck: Normal range of motion. Neck supple.  Cardiovascular: Normal rate, regular rhythm, normal heart sounds and intact distal pulses.   No  murmur heard. Pulmonary/Chest: Effort normal. No respiratory distress. She has no wheezes. She has no rales.  Abdominal: Soft. She exhibits no distension. There is no tenderness.  Musculoskeletal: Normal range of motion. She exhibits no edema or tenderness.  Neurological: She is alert and oriented to person, place, and time.  Patient with weakness in the upper and lower extremities without appreciable difference on my examination.  Patient alert and oriented to person and place.  No definite facial asymmetry but may have mild droop on right side when smiling.  Normal sensation in lower extremities of sharp vs dull sesnation  Skin: Skin is warm and dry. No rash noted. She is not diaphoretic.  Vitals reviewed.   ED Course  Procedures (including critical care time) Labs Review Labs Reviewed  COMPREHENSIVE METABOLIC PANEL - Abnormal; Notable for the following:    BUN 23 (*)  ALT 10 (*)    GFR calc non Af Amer 56 (*)    All other components within normal limits  CBC  URINALYSIS, ROUTINE W REFLEX MICROSCOPIC (NOT AT [facility])  Randolm Idol, ED    Imaging Review Ct Head Wo Contrast  05/16/2015  CLINICAL DATA:  80 year old diabetic hypertensive female with generalize weakness. Initial encounter. EXAM: CT HEAD WITHOUT CONTRAST TECHNIQUE: Contiguous axial images were obtained from the base of the skull through the vertex without intravenous contrast. COMPARISON:  03/05/2015. FINDINGS: No intracranial hemorrhage. New from the prior examination is linear low density superior left paracentral pontine region. Question small acute infarct versus result of streak artifact. Remote basal ganglia infarcts bilaterally. Remote left corona radiata infarct. Prominent small vessel disease type changes. Global atrophy without hydrocephalus. Anterior frontal 1.9 cm calcified mass consistent with meningioma unchanged. No significant surrounding vasogenic edema. Mastoid air cells, middle ear cavities and visualized  paranasal sinuses are clear. Post left lens replacement otherwise orbital structures unremarkable. IMPRESSION: No intracranial hemorrhage. New from the prior examination is linear low density superior left paracentral pontine region. Question small acute infarct versus result of streak artifact. Remote basal ganglia infarcts bilaterally. Remote left corona radiata infarct. Prominent small vessel disease type changes. Global atrophy without hydrocephalus. Anterior frontal 1.9 cm calcified mass consistent with meningioma unchanged. No significant surrounding vasogenic edema. Electronically Signed   By: Genia Del M.D.   On: 05/16/2015 16:28   Dg Chest Portable 1 View  05/16/2015  CLINICAL DATA:  81 year old female with generalized weakness and decreased appetite EXAM: PORTABLE CHEST 1 VIEW COMPARISON:  Prior chest x-ray 04/26/2015 FINDINGS: Stable borderline cardiomegaly with left heart enlargement. Atherosclerotic calcifications again noted in the transverse aorta. Unchanged position of left subclavian approach cardiac rhythm maintenance device with leads overlying the right atrium and right ventricle. Calcified granuloma in the left lower lobe remains unchanged. Dense calcification is also present surrounding the mitral valve annulus. The lungs are clear. No focal airspace consolidation, pleural effusion, pneumothorax or evidence of pulmonary edema. No suspicious pulmonary mass or nodule. Stable mild central bronchitic changes and interstitial prominence. No acute osseous abnormality. IMPRESSION: Stable chest x-ray without evidence of acute cardiopulmonary process. Electronically Signed   By: Jacqulynn Cadet M.D.   On: 05/16/2015 15:15   I have personally reviewed and evaluated these images and lab results as part of my medical decision-making.   EKG Interpretation None      MDM  Patient was seen and evaluated in stable condition.  Patient with mostly generalized weakness on examination.  CT head  concerning for possible infarct.  Patient unable to under go MRI secondary to pacemaker.  No sign of infectious process.  Case was discussed with Dr. Leonel Ramsay who saw patient bedside and said he felt that the patient had had a CVA and who recommended that she be admitted to [facility] under the hospitalist service for further work up and evaluation with neurology follow in consultation. Final diagnoses:  Cerebral infarction due to unspecified mechanism    1. CVA  2. Weakness    Harvel Quale, MD 05/16/15 2104

## 2015-05-16 NOTE — ED Notes (Signed)
Family reported decline in condition with poor appetite and generalized weaknes,. Pt was able to ambulate with walker and in past 3 days pt being unable to ambulate. NIH scale 1 but has weakness to bil ext and A/O to self and place. No redness/swelling, (+)PMS, CRT brisk to bil lower ext. Family denies falling/injury/trauma.

## 2015-05-16 NOTE — ED Notes (Signed)
Awake. Verbally responsive. A/O x3 intermittent confusion to time. Resp even and unlabored. No audible adventitious breath sounds noted. ABC's intact. SR on monitor. IV saline lock patent and intact. Family at bedside.

## 2015-05-16 NOTE — H&P (Signed)
History and Physical  Patient Name: Briana Thornton     [id number]    DOB: [birth year]    DOA: 05/16/2015 Referring physician: Lonia Skinner, MD PCP: Penni Homans, MD      Chief Complaint: Weakness  HPI: Briana Thornton is a 81 y.o. female with a past medical history significant for NIDDM, HTN, CHB with pacemaker, and dementia who presents with one week progressive weakness, foot dragging, difficulty swallowing, and numbness.  The patient was discharged from a nursing home and has been living with her family for the last 3 weeks with in-home PT and OT. Over the last 5 days, her daughter has noticed that she has gotten weaker and weaker. She was previously able to ambulate with a walker and transfer by herself, but over the last few days she requires assistance with all transfers, needs to use a wheelchair, and seems to be dragging her legs.  The family are unclear if this is one leg or both legs (it seems that it is both legs), and the patient is unable to specify if her numbness is on one side or both sides. Because her leg weakness was so dramatically worse today, the patient daughter brought her to the ER.  In the ED, patient had a noncontrasted CT that suggested possible LEFT pontine infarct.  An MRI could not be performed because the patient has a pacemaker. The patient was evaluated by neurology and [facility] was asked to admit for suspected stroke.      Review of Systems:  Pt complains of Leg weakness, numbness.her daughter notes difficulty with hand eye coordination, leg dragging, weakness global, frequent throat clearing at night.  Pt denies any Her, chills, cough, vomiting, diarrhea, poor by mouth intake.  All other systems negative except as just noted or noted in the history of present illness.  No Known Allergies  Prior to Admission medications   Medication Sig Start Date End Date Taking? Authorizing Provider  acetaminophen (TYLENOL) 325 MG tablet Take 2 tablets (650 mg total) by  mouth every 6 (six) hours as needed for mild pain (or Fever >/= 101). 07/27/13  Yes Arlee Muslim, PA-C  alum & mag hydroxide-simeth (MAALOX/MYLANTA) 200-200-20 MG/5ML suspension Take 30 mLs by mouth every 6 (six) hours as needed for indigestion or heartburn (dyspepsia). 07/27/13  Yes Kelvin Cellar, MD  atorvastatin (LIPITOR) 10 MG tablet TAKE 1 TABLET (10 MG TOTAL) BY MOUTH DAILY. 04/26/15  Yes Mosie Lukes, MD  bisacodyl (DULCOLAX) 10 MG suppository Place 1 suppository (10 mg total) rectally daily as needed for moderate constipation. 07/27/13  Yes Kelvin Cellar, MD  docusate sodium (COLACE) 100 MG capsule Take 100 mg by mouth every other day.    Yes Historical Provider, MD  donepezil (ARICEPT) 10 MG tablet Take 1 tablet (10 mg total) by mouth at bedtime. Patient taking differently: Take 10 mg by mouth daily.  04/26/15  Yes Mosie Lukes, MD  metFORMIN (GLUCOPHAGE-XR) 500 MG 24 hr tablet TAKE 1 TABLET (500 MG TOTAL) BY MOUTH DAILY WITH BREAKFAST. 04/26/15  Yes Mosie Lukes, MD  methylcellulose (ARTIFICIAL TEARS) 1 % ophthalmic solution Place 1 drop into both eyes as needed (dry eyes).    Yes Historical Provider, MD  Multiple Vitamin (MULTIVITAMIN WITH MINERALS) TABS tablet Take 2 tablets by mouth daily.    Yes Historical Provider, MD  polyethylene glycol (MIRALAX / GLYCOLAX) packet Take 17 g by mouth daily as needed for mild constipation. 07/27/13  Yes Kelvin Cellar, MD  Probiotic Product (PROBIOTIC  DAILY PO) Take 1 tablet by mouth daily.   Yes Historical Provider, MD  Ipratropium-Albuterol (COMBIVENT RESPIMAT) 20-100 MCG/ACT AERS respimat Inhale 1 puff into the lungs every 6 (six) hours as needed for wheezing or shortness of breath. Patient not taking: Reported on 04/26/2015 03/08/15   Barton Dubois, MD    Past Medical History  Diagnosis Date  . Chicken pox as a child  . Measles as a child  . IBS (irritable bowel syndrome)   . Diabetes mellitus 47    type 2  . Hyperlipidemia 72  .  Hypertension 72  . Vision loss of right eye   . Vertigo 2010    benign  . Calcification of cartilage     ear  . Cardiovascular system disease 07/02/2011  . Hemorrhoid 07/02/2011  . Osteoporosis 07/02/2011  . Weight loss 07/31/2011  . Anemia 05/22/2012  . Broken wrist 10-22-11    left  . Depression 05/22/2012  . Dermatitis 05/22/2012    Right neck    Past Surgical History  Procedure Laterality Date  . Hemorroidectomy  1979  . Knee scoped  2002    right  . Rotator cuff repair  2004    right  . Abdominal hysterectomy  2008    partial still has ovaries  . Lens implant left  2006  . Pacemaker insertion  2010  . Wrist surgery  11-05-11    left wrist  . Intramedullary (im) nail intertrochanteric Right 07/24/2013    Procedure: INTRAMEDULLARY (IM) NAIL INTERTROCHANTRIC;  Surgeon: Gearlean Alf, MD;  Location: [facility];  Service: Orthopedics;  Laterality: Right;    Family history: family history includes Cancer in her brother and maternal grandfather; Diabetes in her daughter, mother, and son; Hypertension in her mother.  Social History: Patient lives with her daughter. She is from [place] originally and moved here to be near her daughter who is a Marine scientist on 6 E here at [employer] about 5 years ago after her husband passed. She lives with her daughter at home. She was previously able to walk with a walker since her hip fracture 2 years ago. She is independent with ADLs, but dependent for all IADLs because of dementia. She had a pneumonia 2 months ago and was in rehabilitation for a month after that. She has been home for about 3 weeks. She has never smoked.        Physical Exam: BP 175/69 mmHg  Pulse 66  Temp(Src) 98 F (36.7 C) (Oral)  Resp 17  SpO2 98% General appearance: Frail elderly adult female, awake and in no acute distress.   Eyes: Anicteric, lids and lashes normal.     ENT: No nasal deformity, discharge, or epistaxis.  OP moist without lesions.   Skin: Warm and dry.  No  jaundice.  No suspicious rashes or lesions. Cardiac: RRR, nl S1-S2, no murmurs appreciated.  Respiratory: CTAB without rales or wheezes. Abdomen: Abdomen soft without rigidity.  No TTP.  MSK: No deformities or effusions. Neuro: Pupils are 4 mm and reactive to 3 mm. Extraocular movements are intact, without nystagmus. Cranial nerve 5 is within normal limits. Cranial nerve 7 is weak on right. Cranial nerve 8 is within normal limits. Cranial nerves 9 and 10 reveal equal palate elevation. Cranial nerve 11 reveals sternocleidomastoid strong. Cranial nerve 12 is midline. The patient is globally weak with 4/5 strength in upper and lower extremities bilaterally and I am not able to appreciate an asymmetry. Speech is fluent. Naming is grossly  intact. Recall, recent and remote, as well as general fund of knowledge seem moderately impaired.   Psych: Behavior appropriate.  Affect flat.  No evidence of aural or visual hallucinations or delusions.       Labs on Admission:  The metabolic panel shows  normal sodium, potassium, bicarbonate, and renal function .  somewhat elevated BUN to creatinine ratio. Transaminases and bilirubin are normal. The troponin is negative. The urinalysis is completely clear.  The complete blood count shows  no anemia or thrombocytopenia .   Radiological Exams on Admission: CT head without contrast 05/16/2015   Possible pontine infarct.  Personally reviewed: Dg Chest Portable 1 View 05/16/2015  Clear, no change from previous.   EKG: Independently reviewed. AV paced.    Assessment/Plan 1. Possible acute Stroke:  This is new.  Other possible diagnostic considerations include failure to thrive or dehydration. Urine is clear. There is no other evidence of infection.  The electrolytes are normal.  Neurology has evaluated and has high suspicion for stroke. -Admit to telemetry -Neuro checks, NIHSS per protocol -Daily aspirin 325 mg -Permissive hypertension for  now -Lipids, hemoglobin A1c -Carotid doppler per Neurology, ordered -Echocardiogram ordered -PT/OT/SLP consultation -Consult to Neurology, appreciate recommendations  2. NIDDM:  -Continue home metformin  3. HTN:  Not on any medicines at baseline because of orthostasis and well controlled BP off meds. Hypertensive at admission -Permissive hypertension for now -Previously was on losartan HCTZ 100-25  4. Dementia:  Stable.  -Continue home donepezil      DVT PPx: SCDs Diet: Heart healthy after swallow screen Consultants: Neurology Code Status: DO NOT RESUSCITATE Family Communication: The diagnosis and expected plan of care were dsicussed with the daughter and son in law at the bedside.  All questions were answered.  Code status was discussed.  Medical decision making: Patient seen 8:17 PM on 05/16/2015.  What exists of the patient's electronic chart were reviewed and the case was discussed with Dr. Alfonse Spruce.   Disposition Plan:  Will admit for possible stroke.  Stroke work up as above and consult to ancillary services.  Expect discharge within 2-3 days.    Edwin Dada [facility] Pager [phone]

## 2015-05-16 NOTE — ED Notes (Signed)
Call to give report. Nurse will call back in 10 minutes.

## 2015-05-16 NOTE — ED Notes (Signed)
Awake. Verbally responsive. A/O x4. Resp even and unlabored. No audible adventitious breath sounds noted. ABC's intact. IV infusing NS at 541ml/hr without difficulty. Family at bedside.

## 2015-05-16 NOTE — Telephone Encounter (Signed)
Made follow up call to see what patients daughter decided to do since she disagreed with [facility]'s suggestions regarding calling EMS. Daughter states they (she and patient) were currently at [facility] ED for patient to be evaluated.

## 2015-05-16 NOTE — ED Notes (Signed)
EDP at bedside. Will return to draw labs

## 2015-05-16 NOTE — Progress Notes (Signed)
Pt arrived to room 5c10 via [facility].  Pt is alert and oriented with some confusion at baseline.  Family is at bedside.  Pt denies any pain.  RN performed stroke swallow screen and pt passed.  Safety measures in place.  Will continue to monitor.   Fredrich Romans, RN

## 2015-05-16 NOTE — ED Notes (Signed)
Per pt's family-states decreased ambulation and weakness in both legs-states she was using walker-has history of compression fracturess

## 2015-05-16 NOTE — Consult Note (Addendum)
Neurology Consultation Reason for Consult: Weakness Referring Physician: Alfonse Spruce, E  CC: Weakness  History is obtained from:Daughter  HPI: Brandilyn Bartosik is a 81 y.o. female who presents with weakness for the past several days. She does have a compromised walking at baseline, but is able to typically walk with a walker and as recently as 2 days ago was able to stand up and transfer to her bedside commode. Fairly abruptly, however, she has lost the ability to do this. Family describes a they're having to physically pick her up and move her. The patient describes that she feels like she has no balance. She denies any numbness, and hasn't noticed any particular area that suite.  CT scan was performed which shows a possible left pontine infarct.  She denies any recent illness, however, she does know that everyone has been sick around her lately.  LKW: 12/17 tpa given?: no, out of window  ROS: A 14 point ROS was performed and is negative except as noted in the HPI.   Past Medical History  Diagnosis Date  . Chicken pox as a child  . Measles as a child  . IBS (irritable bowel syndrome)   . Diabetes mellitus 47    type 2  . Hyperlipidemia 72  . Hypertension 72  . Vision loss of right eye   . Vertigo 2010    benign  . Calcification of cartilage     ear  . Cardiovascular system disease 07/02/2011  . Hemorrhoid 07/02/2011  . Osteoporosis 07/02/2011  . Weight loss 07/31/2011  . Anemia 05/22/2012  . Broken wrist 10-22-11    left  . Depression 05/22/2012  . Dermatitis 05/22/2012    Right neck     Family History  Problem Relation Age of Onset  . Diabetes Mother     type 2  . Hypertension Mother   . Cancer Brother     lung-smoker  . Diabetes Daughter     pre diabetic  . Diabetes Son     type 2  . Cancer Maternal Grandfather     prostate     Social History:  reports that she has never smoked. She has never used smokeless tobacco. She reports that she does not drink alcohol or use  illicit drugs.   Exam: Current vital signs: BP 157/67 mmHg  Pulse 68  Temp(Src) 97.6 F (36.4 C) (Oral)  Resp 18  SpO2 100% Vital signs in last 24 hours: Temp:  [97.6 F (36.4 C)] 97.6 F (36.4 C) (12/19 1311) Pulse Rate:  [60-75] 68 (12/19 1819) Resp:  [5-18] 18 (12/19 1819) BP: (157-185)/(64-91) 157/67 mmHg (12/19 1819) SpO2:  [98 %-100 %] 100 % (12/19 1819)   Physical Exam  Constitutional: Appears elderly Psych: Affect appropriate to situation Eyes: No scleral injection HENT: No OP obstrucion Head: Normocephalic.  Cardiovascular: Normal rate and regular rhythm.  Respiratory: Effort normal and breath sounds normal to anterior ascultation GI: Soft.  No distension. There is no tenderness.  Skin: some bruising  Neuro: Mental Status: Patient is awake, alert, oriented to person, place, month, year. Patient is able to give a clear and coherent history. No signs of aphasia or neglect Cranial Nerves: II: Visual Fields are full.  III,IV, VI: EOMI without ptosis or diploplia.  V: Facial sensation is symmetric to temperature VII: Facial movement is notable for decreased movement on the right.  VIII: hearing is intact to voice X: Uvula elevates symmetrically XI: Shoulder shrug is symmetric. XII: tongue is midline without atrophy  or fasciculations.  Motor: To confrontation, her strength is fairly equal, however there is a pronator drift in the right arm which is not present in the left. She also appears to have 4-/5 strength in the right leg and 4/5 strength in the left leg. Sensory: Sensation is symmetric to light touch, temperature, vibration in the arms and legs Deep Tendon Reflexes: 2+ and symmetric in the biceps. She has reduced reflexes at the patella and ankles bilaterally Cerebellar: She has a marked intentional tremor onto her nose finger bilaterally.   I have reviewed labs in epic and the results pertinent to this consultation are: CBC-unremarkable  I have  reviewed the images obtained: CT head-hypoattenuation in the left pons  Impression: 81 year old female with abrupt worsening in her ability to walk. Though the imaging is not 100% convincing, the fact that she also has a right hemiparesis I think is concerning that this does represent subacute stroke. Given her decline status, she will need to be admitted for physical therapy and would pursue stroke workup as well.   Recommendations: 1. HgbA1c, fasting lipid panel 2. Frequent neuro checks 3. Echocardiogram 4. CT angiogram of the head and neck 5. Prophylactic therapy-Antiplatelet med: Aspirin - dose 325mg  PO or 300mg  PR 6. Risk factor modification 7. Telemetry monitoring 8. PT consult, OT consult   Roland Rack, MD [facility] [phone]  If 7pm- 7am, please page neurology on call as listed in [place].

## 2015-05-16 NOTE — ED Notes (Signed)
Awake. Verbally responsive. A/O x4. Resp even and unlabored. No audible adventitious breath sounds noted. ABC's intact. Family at bedside. 

## 2015-05-16 NOTE — Telephone Encounter (Signed)
Patient Name: Briana Thornton  DOB: [birth year]    Initial Comment Caller states her mother is unable to walk and slower to respond   Nurse Assessment  Nurse: Leilani Merl, RN, Heather Date/Time (Eastern Time): 05/16/2015 9:58:30 AM  Confirm and document reason for call. If symptomatic, describe symptoms. ---Caller states her mother is unable to walk and slower to respond since Thursday  Has the patient traveled out of the country within the last 30 days? ---Not Applicable  Does the patient have any new or worsening symptoms? ---Yes  Will a triage be completed? ---Yes  Related visit to physician within the last 2 weeks? ---No  Does the PT have any chronic conditions? (i.e. diabetes, asthma, etc.) ---Yes  List chronic conditions. ---see MR  Is this a behavioral health or substance abuse call? ---No     Guidelines    Guideline Title Affirmed Question Affirmed Notes  Weakness (Generalized) and Fatigue [1] SEVERE weakness (i.e., unable to walk or barely able to walk, requires support) AND [2] new onset or worsening    Final Disposition User   Call EMS 911 Now [place], RN, Water quality scientist    Referrals  GO TO FACILITY OTHER - SPECIFY   Disagree/Comply: Disagree  Disagree/Comply Reason: Disagree with instructions

## 2015-05-17 ENCOUNTER — Encounter (HOSPITAL_COMMUNITY): Payer: Medicare Other

## 2015-05-17 DIAGNOSIS — I6350 Cerebral infarction due to unspecified occlusion or stenosis of unspecified cerebral artery: Secondary | ICD-10-CM

## 2015-05-17 DIAGNOSIS — F0390 Unspecified dementia without behavioral disturbance: Secondary | ICD-10-CM

## 2015-05-17 DIAGNOSIS — G8191 Hemiplegia, unspecified affecting right dominant side: Secondary | ICD-10-CM

## 2015-05-17 DIAGNOSIS — Z950 Presence of cardiac pacemaker: Secondary | ICD-10-CM

## 2015-05-17 DIAGNOSIS — E785 Hyperlipidemia, unspecified: Secondary | ICD-10-CM

## 2015-05-17 LAB — GLUCOSE, CAPILLARY
GLUCOSE-CAPILLARY: 111 mg/dL — AB (ref 65–99)
GLUCOSE-CAPILLARY: 127 mg/dL — AB (ref 65–99)
GLUCOSE-CAPILLARY: 90 mg/dL (ref 65–99)
Glucose-Capillary: 74 mg/dL (ref 65–99)

## 2015-05-17 LAB — LIPID PANEL
CHOL/HDL RATIO: 2 ratio
CHOLESTEROL: 128 mg/dL (ref 0–200)
HDL: 65 mg/dL (ref >40)
LDL Cholesterol: 51 mg/dL (ref 0–99)
Triglycerides: 61 mg/dL (ref <150)
VLDL: 12 mg/dL (ref 0–40)

## 2015-05-17 MED ORDER — INSULIN ASPART 100 UNIT/ML ~~LOC~~ SOLN: 0.0000 [IU] | Freq: Three times a day (TID) | SUBCUTANEOUS | Status: DC

## 2015-05-17 NOTE — Progress Notes (Signed)
Rehab Admissions Coordinator Note:  Patient was screened by Retta Diones for appropriateness for an Inpatient Acute Rehab Consult.  At this time, we are recommending Inpatient Rehab consult.  Retta Diones 05/17/2015, 12:26 PM  I can be reached at [phone].

## 2015-05-17 NOTE — Evaluation (Signed)
Physical Therapy Evaluation Patient Details Name: Briana Thornton MRN: [id number] DOB: [birth year] Today's Date: 05/17/2015   History of Present Illness  81 y.o. female with a past medical history significant for NIDDM, HTN, CHB with pacemaker, and dementia who presents with one week progressive weakness, foot dragging, difficulty swallowing, and numbness. CT suggests possible left pontine infarct. Remote basal ganglia infarcts bilaterally. Remote left corona infarct. D/Cd from SNF and living with family last three weeks.      Clinical Impression  Pt admitted with above diagnosis. Patient recently completed SNF level rehab and was receiving HHPT. She was ambulating modified independent with RW prior to recent decline in strength. Pt currently with functional limitations due to the deficits listed below (see PT Problem List).  Pt will benefit from skilled PT to increase their independence and safety with mobility to allow discharge to the venue listed below.       Follow Up Recommendations CIR    Equipment Recommendations  None recommended by PT    Recommendations for Other Services Rehab consult     Precautions / Restrictions Precautions Precautions: Fall Restrictions Weight Bearing Restrictions: No      Mobility  Bed Mobility               General bed mobility comments: up in chair  Transfers Overall transfer level: Needs assistance Equipment used: Rolling walker (2 wheeled) Transfers: Sit to/from Stand Sit to Stand: Max assist         General transfer comment: due to posterior lean (partially due to tight heel cords); x 2  Ambulation/Gait Ambulation/Gait assistance: Mod assist Ambulation Distance (Feet): 10 Feet (toileted, 5) Assistive device: Rolling walker (2 wheeled) Gait Pattern/deviations: Step-to pattern;Decreased stride length;Decreased dorsiflexion - right;Decreased dorsiflexion - left;Leaning posteriorly;Shuffle;Trunk flexed;Narrow base of support    Gait velocity interpretation: <1.8 ft/sec, indicative of risk for recurrent falls General Gait Details: loses balance posteriorly; requires assist to maneuver RW in all turns; increased difficulty advancing LLE (especially when turning or stepping backwards)  Stairs            Wheelchair Mobility    Modified Rankin (Stroke Patients Only) Modified Rankin (Stroke Patients Only) Pre-Morbid Rankin Score: Moderate disability Modified Rankin: Moderately severe disability     Balance Overall balance assessment: Needs assistance Sitting-balance support: No upper extremity supported;Feet unsupported Sitting balance-[name]: Fair     Standing balance support: Bilateral upper extremity supported Standing balance-[name]: Zero Standing balance comment: no righting reactions as she begins to fall posteriorly                             Pertinent Vitals/Pain Pain Assessment: No/denies pain    Home Living Family/patient expects to be discharged to:: Private residence Living Arrangements: Children Available Help at Discharge: Family Type of Home: House Home Access: Ramped entrance     Home Layout: One level Home Equipment: Environmental consultant - 2 wheels;Wheelchair - manual Additional Comments: resides at daughter's home    Prior Function Level of Independence: Needs assistance   Gait / Transfers Assistance Needed: left SNF  weeks ago walking modified independent with RW; over past week progressivley weaker to point of non-ambulatory and using w/c           Hand Dominance   Dominant Hand: Right    Extremity/Trunk Assessment   Upper Extremity Assessment: Defer to OT evaluation           Lower Extremity Assessment: RLE deficits/detail;LLE  deficits/detail RLE Deficits / Details: AROM WFL except ankle DF _40 degrees (tight heel cords); quads 3+ LLE Deficits / Details: AROM WFL except ankle DF _40 degrees (tight heel cords); quads 3+  Cervical / Trunk  Assessment: Kyphotic  Communication   Communication: No difficulties  Cognition Arousal/Alertness: Awake/alert Behavior During Therapy: WFL for tasks assessed/performed Overall Cognitive Status: No family/caregiver present to determine baseline cognitive functioning                      General Comments      Exercises        Assessment/Plan    PT Assessment Patient needs continued PT services  PT Diagnosis Difficulty walking;Generalized weakness   PT Problem List Decreased strength;Decreased range of motion;Decreased activity tolerance;Decreased balance;Decreased mobility;Decreased knowledge of use of DME;Decreased safety awareness  PT Treatment Interventions DME instruction;Gait training;Functional mobility training;Therapeutic activities;Balance training;Neuromuscular re-education;Patient/family education   PT Goals (Current goals can be found in the Care Plan section) Acute Rehab PT Goals Patient Stated Goal: return home  PT Goal Formulation: With patient Time For Goal Achievement: 05/31/15 Potential to Achieve Goals: Good    Frequency Min 3X/week   Barriers to discharge Decreased caregiver support per pt she has short periods of time when she is home alone    Co-evaluation               End of Session Equipment Utilized During Treatment: Gait belt Activity Tolerance: Patient limited by fatigue Patient left: in chair;with call bell/phone within reach;with chair alarm set;with nursing/sitter in room Nurse Communication: Mobility status (recommend [facility])         Time: [id number] PT Time Calculation (min) (ACUTE ONLY): 38 min   Charges:   PT Evaluation $Initial PT Evaluation Tier I: 1 Procedure PT Treatments $Gait Training: 8-22 mins   PT G Codes:        Briana Thornton Jun 03, 2015, 12:12 PM Pager [phone]

## 2015-05-17 NOTE — Evaluation (Signed)
Speech Language Pathology Evaluation Patient Details Name: Divisha Antalek MRN: [id number] DOB: April 27, 1927 Today's Date: 05/17/2015 Time: [id number] SLP Time Calculation (min) (ACUTE ONLY): 23 min  Problem List:  Patient Active Problem List   Diagnosis Date Noted  . CVA (cerebral infarction) 05/16/2015  . Acute ischemic stroke ([place]) 05/16/2015  . CAP (community acquired pneumonia) 03/05/2015  . SOB (shortness of breath) 03/05/2015  . Hypoxia 03/05/2015  . Weakness 03/05/2015  . Dehydration, mild 03/05/2015  . Controlled type 2 diabetes mellitus with diabetic nephropathy, without long-term current use of insulin ([place])   . Onychomycosis 10/31/2014  . UTI symptoms 01/25/2014  . Dysphagia 01/25/2014  . Abnormal finding on urinalysis 01/25/2014  . Dementia without behavioral disturbance 08/06/2013  . Postoperative anemia due to acute blood loss 07/26/2013  . Hip fracture ([place]) 07/24/2013  . Thyroid mass of unclear etiology 07/24/2013  . Fall at home 07/24/2013  . Cerumen impaction 04/21/2013  . Cognitive decline 02/15/2013  . Anemia 05/22/2012  . Depression 05/22/2012  . Dermatitis 05/22/2012  . Broken wrist   . Pacemaker-St.[employer]ude 09/18/2011  . URI (upper respiratory infection) 08/31/2011  . Lymph node enlargement 08/31/2011  . Weight loss 07/31/2011  . Cardiovascular system disease 07/02/2011  . Hemorrhoid 07/02/2011  . Osteoporosis 07/02/2011  . Essential hypertension   . Hyperlipidemia   . IBS (irritable bowel syndrome)   . DM (diabetes mellitus) ([place])   . Calcification of cartilage   . BPPV (benign paroxysmal positional vertigo)   . Vision loss of right eye    Past Medical History:  Past Medical History  Diagnosis Date  . Chicken pox as a child  . Measles as a child  . IBS (irritable bowel syndrome)   . Diabetes mellitus 47    type 2  . Hyperlipidemia 72  . Hypertension 72  . Vision loss of right eye   . Vertigo 2010    benign  . Calcification of cartilage    ear  . Cardiovascular system disease 07/02/2011  . Hemorrhoid 07/02/2011  . Osteoporosis 07/02/2011  . Weight loss 07/31/2011  . Anemia 05/22/2012  . Broken wrist 10-22-11    left  . Depression 05/22/2012  . Dermatitis 05/22/2012    Right neck   Past Surgical History:  Past Surgical History  Procedure Laterality Date  . Hemorroidectomy  1979  . Knee scoped  2002    right  . Rotator cuff repair  2004    right  . Abdominal hysterectomy  2008    partial still has ovaries  . Lens implant left  2006  . Pacemaker insertion  2010  . Wrist surgery  11-05-11    left wrist  . Intramedullary (im) nail intertrochanteric Right 07/24/2013    Procedure: INTRAMEDULLARY (IM) NAIL INTERTROCHANTRIC;  Surgeon: Gearlean Alf, MD;  Location: [facility]S;  Service: Orthopedics;  Laterality: Right;   HPI:  81 y.o. female with a past medical history significant for NIDDM, HTN, CHB with pacemaker, and dementia who presents with one week progressive weakness, foot dragging, difficulty swallowing, and numbness. CT suggests possible left pontine infarct.  D/Cd from SNF and living with family last three weeks.     Assessment / Plan / Recommendation Clinical Impression  Pt's cognitive deficits are characterized by disorientation to time/place, difficulty with short-term recall, difficulty relaying specific information about current issues.  However, pt discusses in appropriate detail events related to her past, her new great-grandson, and her living arrangement with her family.  Cognitive deficits are likely  at baseline.  No SLP intervention is warranted - will sign off.     SLP Assessment  Patient does not need any further Speech Lanaguage Pathology Services    Follow Up Recommendations  None               SLP Evaluation Prior Functioning  Cognitive/Linguistic Baseline: Baseline deficits Baseline deficit details:  (dementia per chart) Type of Home: House  Lives With: Family Available Help at Discharge:  Family;Available PRN/intermittently   Cognition  Overall Cognitive Status: No family/caregiver present to determine baseline cognitive functioning Arousal/Alertness: Awake/alert Orientation Level: Oriented to person;Disoriented to place;Disoriented to time;Disoriented to situation Attention: Focused Focused Attention: Appears intact Memory: Impaired Memory Impairment: Storage deficit;Retrieval deficit Awareness: Impaired Awareness Impairment: Intellectual impairment Problem Solving: Impaired Problem Solving Impairment: Verbal basic Safety/Judgment: Impaired    Comprehension  Auditory Comprehension Overall Auditory Comprehension: Appears within functional limits for tasks assessed Visual Recognition/Discrimination Discrimination: Within Function Limits Reading Comprehension Reading Status: Not tested    Expression Expression Primary Mode of Expression: Verbal Verbal Expression Overall Verbal Expression: Appears within functional limits for tasks assessed Written Expression Dominant Hand: Right Written Expression: Not tested   Oral / Motor Oral Motor/Sensory Function Overall Oral Motor/Sensory Function: Within functional limits Motor Speech Overall Motor Speech: Appears within functional limits for tasks assessed    Juan Quam Laurice 05/17/2015, 10:15 AM

## 2015-05-17 NOTE — Progress Notes (Signed)
Patient is having increased confusion (sundowning?) does not recall any of the things that have transpired today, keeps calling her daughter her mother, will continue to monitor

## 2015-05-17 NOTE — Progress Notes (Signed)
STROKE TEAM PROGRESS NOTE   HISTORY Briana Thornton is a 81 y.o. female who presents with weakness for the past several days. She does have a compromised walking at baseline, but is able to typically walk with a walker and as recently as 2 days ago was able to stand up and transfer to her bedside commode. Fairly abruptly, however, she has lost the ability to do this. Family describes a they're having to physically pick her up and move her. The patient describes that she feels like she has no balance. She denies any numbness, and hasn't noticed any particular area that suite. CT scan was performed which shows a possible left pontine infarct. She denies any recent illness, however, she does know that everyone has been sick around her lately. She was LKW 12/17. Patient was not administered TPA secondary to being out of window. She was admitted for further evaluation and treatment.   SUBJECTIVE (INTERVAL HISTORY) Her granddaughter, who is an ortho nurse and her grandmother lives with her, is at the bedside.  Overall she feels her condition is stable. Iopidine extensive history from patient and granddaughter and she had chronic gait difficulties particularly following her hip surgery earlier this year and denies focal symptoms like diplopia slurred speech or extremity weakness to suggest acute stroke   OBJECTIVE Temp:  [97.3 F (36.3 C)-98.1 F (36.7 C)] 97.7 F (36.5 C) (12/20 1027) Pulse Rate:  [60-75] 60 (12/20 1027) Cardiac Rhythm:  [-] Ventricular paced (12/20 0839) Resp:  [5-20] 20 (12/20 1027) BP: (110-185)/(46-99) 142/46 mmHg (12/20 1027) SpO2:  [97 %-100 %] 100 % (12/20 1027) Weight:  [52.3 kg (115 lb 4.8 oz)] 52.3 kg (115 lb 4.8 oz) (12/19 2100)  CBC:   Recent Labs Lab 05/16/15 1448 05/16/15 2210  WBC 8.0 8.5  HGB 12.1 11.9*  HCT 39.6 37.7  MCV 93.0 92.9  PLT 248 [id number]    Basic Metabolic Panel:   Recent Labs Lab 05/16/15 1448 05/16/15 2210  NA 144 144  K 4.0 3.8  CL 107  110  CO2 30 26  GLUCOSE 96 116*  BUN 23* 19  CREATININE 0.91 0.90  CALCIUM 9.2 9.1    Lipid Panel:     Component Value Date/Time   CHOL 128 05/17/2015 0822   TRIG 61 05/17/2015 0822   HDL 65 05/17/2015 0822   CHOLHDL 2.0 05/17/2015 0822   VLDL 12 05/17/2015 0822   LDLCALC 51 05/17/2015 0822   HgbA1c:  Lab Results  Component Value Date   HGBA1C 5.5 04/26/2015   Urine Drug Screen: No results found for: LABOPIA, COCAINSCRNUR, LABBENZ, AMPHETMU, THCU, LABBARB    IMAGING  Ct Head Wo Contrast  05/16/2015  CLINICAL DATA:  81 year old diabetic hypertensive female with generalize weakness. Initial encounter. EXAM: CT HEAD WITHOUT CONTRAST TECHNIQUE: Contiguous axial images were obtained from the base of the skull through the vertex without intravenous contrast. COMPARISON:  03/05/2015. FINDINGS: No intracranial hemorrhage. New from the prior examination is linear low density superior left paracentral pontine region. Question small acute infarct versus result of streak artifact. Remote basal ganglia infarcts bilaterally. Remote left corona radiata infarct. Prominent small vessel disease type changes. Global atrophy without hydrocephalus. Anterior frontal 1.9 cm calcified mass consistent with meningioma unchanged. No significant surrounding vasogenic edema. Mastoid air cells, middle ear cavities and visualized paranasal sinuses are clear. Post left lens replacement otherwise orbital structures unremarkable. IMPRESSION: No intracranial hemorrhage. New from the prior examination is linear low density superior left paracentral pontine region. Question  small acute infarct versus result of streak artifact. Remote basal ganglia infarcts bilaterally. Remote left corona radiata infarct. Prominent small vessel disease type changes. Global atrophy without hydrocephalus. Anterior frontal 1.9 cm calcified mass consistent with meningioma unchanged. No significant surrounding vasogenic edema. Electronically  Signed   By: Genia Del M.D.   On: 05/16/2015 16:28   Dg Chest Portable 1 View  05/16/2015  CLINICAL DATA:  81 year old female with generalized weakness and decreased appetite EXAM: PORTABLE CHEST 1 VIEW COMPARISON:  Prior chest x-ray 04/26/2015 FINDINGS: Stable borderline cardiomegaly with left heart enlargement. Atherosclerotic calcifications again noted in the transverse aorta. Unchanged position of left subclavian approach cardiac rhythm maintenance device with leads overlying the right atrium and right ventricle. Calcified granuloma in the left lower lobe remains unchanged. Dense calcification is also present surrounding the mitral valve annulus. The lungs are clear. No focal airspace consolidation, pleural effusion, pneumothorax or evidence of pulmonary edema. No suspicious pulmonary mass or nodule. Stable mild central bronchitic changes and interstitial prominence. No acute osseous abnormality. IMPRESSION: Stable chest x-ray without evidence of acute cardiopulmonary process. Electronically Signed   By: Jacqulynn Cadet M.D.   On: 05/16/2015 15:15       PHYSICAL EXAM Frail cachectic malnourised elderly Caucasian lady not in distress. . Afebrile. Head is nontraumatic. Neck is supple without bruit.    Cardiac exam no murmur or gallop. Lungs are clear to auscultation. Distal pulses are well felt. Neurological Exam :  Awake alert oriented 3. Diminished attention, registration and recall. No aphasia or apraxia dysarthria. Extraocular movements are full range except restriction of vertical upgaze. Pupils irregular but reactive. Fundi were not visualized. Vision acuity seems adequate. Face is symmetric without weakness. Tongue is midline. Motor system exam reveals no upper or lower extremity drift but slight stiffness of the right hip and hamstrings with mild weakness of the right hip and knee. Deep tendon reflexes are 2+ symmetric. Plantars are downgoing. Sensation appears intact bilaterally. Gait  was not tested. ASSESSMENT/PLAN Briana Thornton is a 81 y.o. female with history of NIDDM, HTN, CHB with pacemaker, and dementia presenting with progressive inability to walk, R hemiparesis. She did not receive IV t-PA due to delay in arrival.   Stroke vs white matter disease from aging. No focal symptoms of stroke. Workup underway  Resultant  LE weakness/ataxia  MRI  / MRA  Pacer  Repeat CT head to confirm/refute stroke. Will do CTA head and neck as well  Carotid Doppler  canceled  2D Echo  pending   LDL 57  HgbA1c 5.5 in Nov  SCDs for VTE prophylaxis Diet heart healthy/carb modified Room service appropriate?: Yes; Fluid consistency:: Thin  No antithrombotic prior to admission, now on aspirin 325 mg daily  Patient counseled to be compliant with her antithrombotic medications  Ongoing aggressive stroke risk factor management  Therapy recommendations:  CIR. Rehab consulted  Disposition:  pending (lives with granddaughter, an ortho nurse full time)  Hypertension  Stable  Permissive hypertension (OK if < 220/120) but gradually normalize in 5-7 days  Hyperlipidemia  Home meds:  lipitor 10, resumed in hospital  LDL 57, goal < 70  Continue statin at discharge  Diabetes type II  HgbA1c 5.5 in Nov, goal < 7.0  Controlled  Other Stroke Risk Factors  Advanced age  Other Active Problems  vision loss R eye  Dementia on aricept  Hospital day # [place] for Pager information 05/17/2015 1:24 PM  I have personally examined this patient, reviewed notes, independently viewed imaging studies, participated in medical decision making and plan of care. I have made any additions or clarifications directly to the above note. Agree with note above. Patient has had subacute worsening of gait without definite clear focal findings suggestive of a stroke however the CT scan raises suspicion for pontine infarct. She remains at risk for  neurological worsening, recurrent stroke, TIA needs ongoing evaluation. We cannot obtain MRI scan due to pacemaker hence we will obtain CT angiogram instead. Physical occupational therapy consults and she may likely need inpatient rehabilitation for gait and balance. I had a long discussion of the bedside with the patient's daughter and answered questions.  Antony Contras, MD Medical Director [facility] Pager: [phone] 05/17/2015 2:38 PM    To contact Stroke Continuity provider, please refer to [email or web address] After hours, contact General Neurology

## 2015-05-17 NOTE — Consult Note (Signed)
Physical Medicine and Rehabilitation Consult Reason for Consult: Acute Left pontine infarct Referring Physician: [facility]   HPI: Briana Thornton is a 81 y.o. right handed female with history of hypertension, diabetes mellitus with peripheral neuropathy,CHB with pacemaker, dementia maintained on Aricept. Patient lives with daughter who is a Marine scientist at [employer] and son-in-law. Patient used a walker prior to admission. Family assistance is needed. Recently discharged from [facility] 3 weeks ago for pneumonia. Presented 05/16/2015 with right sided weakness 1 week. Cranial CT scan shows early left pontine infarct as well as remote basal ganglia infarcts bilaterally, remote left corona radiata infarct.Marland Kitchen MRI not completed due to pacemaker. Patient did not receive TPA. Echocardiogram CT angiogram of head and neck are all pending. Presently maintained on aspirin for CVA prophylaxis. Tolerating a regular consistency diet. Physical therapy evaluation completed 05/17/2015 with recommendations of physical medicine rehabilitation consult.   Review of Systems  Constitutional: Negative for fever and chills.  HENT: Negative for hearing loss.   Eyes:       Decreased vision right eye  Respiratory: Negative for cough and shortness of breath.   Cardiovascular: Positive for palpitations. Negative for chest pain and leg swelling.  Gastrointestinal: Positive for constipation.  Genitourinary: Negative for dysuria and flank pain.  Musculoskeletal: Positive for myalgias and joint pain.  Skin: Negative for rash.  Neurological: Positive for weakness. Negative for headaches.  Psychiatric/Behavioral: Positive for depression and memory loss.  All other systems reviewed and are negative.  Past Medical History  Diagnosis Date  . Chicken pox as a child  . Measles as a child  . IBS (irritable bowel syndrome)   . Diabetes mellitus 47    type 2  . Hyperlipidemia 72  . Hypertension  72  . Vision loss of right eye   . Vertigo 2010    benign  . Calcification of cartilage     ear  . Cardiovascular system disease 07/02/2011  . Hemorrhoid 07/02/2011  . Osteoporosis 07/02/2011  . Weight loss 07/31/2011  . Anemia 05/22/2012  . Broken wrist 10-22-11    left  . Depression 05/22/2012  . Dermatitis 05/22/2012    Right neck   Past Surgical History  Procedure Laterality Date  . Hemorroidectomy  1979  . Knee scoped  2002    right  . Rotator cuff repair  2004    right  . Abdominal hysterectomy  2008    partial still has ovaries  . Lens implant left  2006  . Pacemaker insertion  2010  . Wrist surgery  11-05-11    left wrist  . Intramedullary (im) nail intertrochanteric Right 07/24/2013    Procedure: INTRAMEDULLARY (IM) NAIL INTERTROCHANTRIC;  Surgeon: Gearlean Alf, MD;  Location: [facility];  Service: Orthopedics;  Laterality: Right;   Family History  Problem Relation Age of Onset  . Diabetes Mother     type 2  . Hypertension Mother   . Cancer Brother     lung-smoker  . Diabetes Daughter     pre diabetic  . Diabetes Son     type 2  . Cancer Maternal Grandfather     prostate   Social History:  reports that she has never smoked. She has never used smokeless tobacco. She reports that she does not drink alcohol or use illicit drugs. Allergies: No Known Allergies Medications Prior to Admission  Medication Sig Dispense Refill  . acetaminophen (TYLENOL) 325 MG tablet Take 2 tablets (650 mg  total) by mouth every 6 (six) hours as needed for mild pain (or Fever >/= 101). 60 tablet 0  . alum & mag hydroxide-simeth (MAALOX/MYLANTA) 200-200-20 MG/5ML suspension Take 30 mLs by mouth every 6 (six) hours as needed for indigestion or heartburn (dyspepsia). 355 mL 0  . atorvastatin (LIPITOR) 10 MG tablet TAKE 1 TABLET (10 MG TOTAL) BY MOUTH DAILY. 90 tablet 3  . bisacodyl (DULCOLAX) 10 MG suppository Place 1 suppository (10 mg total) rectally daily as needed for moderate constipation.  12 suppository 0  . docusate sodium (COLACE) 100 MG capsule Take 100 mg by mouth every other day.     . donepezil (ARICEPT) 10 MG tablet Take 1 tablet (10 mg total) by mouth at bedtime. (Patient taking differently: Take 10 mg by mouth daily. ) 90 tablet 3  . metFORMIN (GLUCOPHAGE-XR) 500 MG 24 hr tablet TAKE 1 TABLET (500 MG TOTAL) BY MOUTH DAILY WITH BREAKFAST. 90 tablet 3  . methylcellulose (ARTIFICIAL TEARS) 1 % ophthalmic solution Place 1 drop into both eyes as needed (dry eyes).     . Multiple Vitamin (MULTIVITAMIN WITH MINERALS) TABS tablet Take 2 tablets by mouth daily.     . polyethylene glycol (MIRALAX / GLYCOLAX) packet Take 17 g by mouth daily as needed for mild constipation. 14 each 0  . Probiotic Product (PROBIOTIC DAILY PO) Take 1 tablet by mouth daily.    . Ipratropium-Albuterol (COMBIVENT RESPIMAT) 20-100 MCG/ACT AERS respimat Inhale 1 puff into the lungs every 6 (six) hours as needed for wheezing or shortness of breath. (Patient not taking: Reported on 04/26/2015)      Home: Home Living Family/patient expects to be discharged to:: Private residence Living Arrangements: Children Available Help at Discharge: Family Type of Home: House Home Access: [place]: One [place]: Environmental consultant - 2 wheels, Wheelchair - manual Additional Comments: resides at daughter's home  Lives With: Family  Functional History: Prior Function Level of Independence: Needs assistance Gait / Transfers Assistance Needed: left SNF  weeks ago walking modified independent with RW; over past week progressivley weaker to point of non-ambulatory and using w/c Functional Status:  Mobility: Bed Mobility General bed mobility comments: up in chair Transfers Overall transfer level: Needs assistance Equipment used: Rolling walker (2 wheeled) Transfers: Sit to/from Stand Sit to Stand: Max assist General transfer comment: due to posterior lean (partially due to tight heel cords); x  2 Ambulation/Gait Ambulation/Gait assistance: Mod assist Ambulation Distance (Feet): 10 Feet (toileted, 5) Assistive device: Rolling walker (2 wheeled) Gait Pattern/deviations: Step-to pattern, Decreased stride length, Decreased dorsiflexion - right, Decreased dorsiflexion - left, Leaning posteriorly, Shuffle, Trunk flexed, Narrow base of support General Gait Details: loses balance posteriorly; requires assist to maneuver RW in all turns; increased difficulty advancing LLE (especially when turning or stepping backwards) Gait velocity interpretation: <1.8 ft/sec, indicative of risk for recurrent falls    ADL:    Cognition: Cognition Overall Cognitive Status: No family/caregiver present to determine baseline cognitive functioning Arousal/Alertness: Awake/alert Orientation Level: Oriented to person, Disoriented to place, Disoriented to time, Disoriented to situation Attention: Focused Focused Attention: Appears intact Memory: Impaired Memory Impairment: Storage deficit, Retrieval deficit Awareness: Impaired Awareness Impairment: Intellectual impairment Problem Solving: Impaired Problem Solving Impairment: Verbal basic Safety/Judgment: Impaired Cognition Arousal/Alertness: Awake/alert Behavior During Therapy: WFL for tasks assessed/performed Overall Cognitive Status: No family/caregiver present to determine baseline cognitive functioning  Blood pressure 142/46, pulse 60, temperature 97.7 F (36.5 C), temperature source Oral, resp. rate 20, height 5\' 2"  (1.575  m), weight 52.3 kg (115 lb 4.8 oz), SpO2 100 %. Physical Exam  Constitutional:  Frail appearing   HENT:  Head: Normocephalic.  Eyes: Pupils are equal, round, and reactive to light.  Neck: Normal range of motion. Neck supple. No thyromegaly present.  Cardiovascular: Normal rate and regular rhythm.   Respiratory: Effort normal and breath sounds normal. No respiratory distress.  GI: Soft. Bowel sounds are normal. She exhibits  no distension.  Neurological: She is alert.  Makes good eye contact with examiner.Follows commands.Provides age but needs cues for DOB.Limited medical historian. RUE 2+ to 3/5. RLE 3-/5. LUE and LEL 3+ to 4/5. Mild right central 7 and tongue deviation. Inconsistent memory and attention  Skin: Skin is warm and dry.  Psychiatric:  Pleasant, cooperative    Results for orders placed or performed during the hospital encounter of 05/16/15 (from the past 24 hour(s))  CBC     Status: None   Collection Time: 05/16/15  2:48 PM  Result Value Ref Range   WBC 8.0 4.0 - 10.5 K/uL   RBC 4.26 3.87 - 5.11 MIL/uL   Hemoglobin 12.1 12.0 - 15.0 g/dL   HCT 39.6 36.0 - 46.0 %   MCV 93.0 78.0 - 100.0 fL   MCH 28.4 26.0 - 34.0 pg   MCHC 30.6 30.0 - 36.0 g/dL   RDW 14.4 11.5 - 15.5 %   Platelets 248 150 - 400 K/uL  Comprehensive metabolic panel     Status: Abnormal   Collection Time: 05/16/15  2:48 PM  Result Value Ref Range   Sodium 144 135 - 145 mmol/L   Potassium 4.0 3.5 - 5.1 mmol/L   Chloride 107 101 - 111 mmol/L   CO2 30 22 - 32 mmol/L   Glucose, Bld 96 65 - 99 mg/dL   BUN 23 (H) 6 - 20 mg/dL   Creatinine, Ser 0.91 0.44 - 1.00 mg/dL   Calcium 9.2 8.9 - 10.3 mg/dL   Total Protein 6.6 6.5 - 8.1 g/dL   Albumin 3.7 3.5 - 5.0 g/dL   AST 18 15 - 41 U/L   ALT 10 (L) 14 - 54 U/L   Alkaline Phosphatase 74 38 - 126 U/L   Total Bilirubin 0.8 0.3 - 1.2 mg/dL   GFR calc non Af Amer 56 (L) >60 mL/min   GFR calc Af Amer >60 >60 mL/min   Anion gap 7 5 - 15  I-stat troponin, ED     Status: None   Collection Time: 05/16/15  3:28 PM  Result Value Ref Range   Troponin i, poc 0.01 0.00 - 0.08 ng/mL   Comment 3          Urinalysis, Routine w reflex microscopic (not at [facility])     Status: None   Collection Time: 05/16/15  4:06 PM  Result Value Ref Range   Color, Urine YELLOW YELLOW   APPearance CLEAR CLEAR   Specific Gravity, Urine 1.014 1.005 - 1.030   pH 7.5 5.0 - 8.0   Glucose, UA NEGATIVE NEGATIVE mg/dL    Hgb urine dipstick NEGATIVE NEGATIVE   Bilirubin Urine NEGATIVE NEGATIVE   Ketones, ur NEGATIVE NEGATIVE mg/dL   Protein, ur NEGATIVE NEGATIVE mg/dL   Nitrite NEGATIVE NEGATIVE   Leukocytes, UA NEGATIVE NEGATIVE  Glucose, capillary     Status: None   Collection Time: 05/16/15  9:51 PM  Result Value Ref Range   Glucose-Capillary 90 65 - 99 mg/dL   Comment 1 Notify RN  Comment 2 Document in Chart   Comprehensive metabolic panel     Status: Abnormal   Collection Time: 05/16/15 10:10 PM  Result Value Ref Range   Sodium 144 135 - 145 mmol/L   Potassium 3.8 3.5 - 5.1 mmol/L   Chloride 110 101 - 111 mmol/L   CO2 26 22 - 32 mmol/L   Glucose, Bld 116 (H) 65 - 99 mg/dL   BUN 19 6 - 20 mg/dL   Creatinine, Ser 0.90 0.44 - 1.00 mg/dL   Calcium 9.1 8.9 - 10.3 mg/dL   Total Protein 5.9 (L) 6.5 - 8.1 g/dL   Albumin 3.2 (L) 3.5 - 5.0 g/dL   AST 17 15 - 41 U/L   ALT 10 (L) 14 - 54 U/L   Alkaline Phosphatase 71 38 - 126 U/L   Total Bilirubin 0.9 0.3 - 1.2 mg/dL   GFR calc non Af Amer 56 (L) >60 mL/min   GFR calc Af Amer >60 >60 mL/min   Anion gap 8 5 - 15  CBC     Status: Abnormal   Collection Time: 05/16/15 10:10 PM  Result Value Ref Range   WBC 8.5 4.0 - 10.5 K/uL   RBC 4.06 3.87 - 5.11 MIL/uL   Hemoglobin 11.9 (L) 12.0 - 15.0 g/dL   HCT 37.7 36.0 - 46.0 %   MCV 92.9 78.0 - 100.0 fL   MCH 29.3 26.0 - 34.0 pg   MCHC 31.6 30.0 - 36.0 g/dL   RDW 14.4 11.5 - 15.5 %   Platelets 219 150 - 400 K/uL  Glucose, capillary     Status: None   Collection Time: 05/17/15  6:17 AM  Result Value Ref Range   Glucose-Capillary 74 65 - 99 mg/dL  Lipid panel     Status: None   Collection Time: 05/17/15  8:22 AM  Result Value Ref Range   Cholesterol 128 0 - 200 mg/dL   Triglycerides 61 <150 mg/dL   HDL 65 >40 mg/dL   Total CHOL/HDL Ratio 2.0 RATIO   VLDL 12 0 - 40 mg/dL   LDL Cholesterol 51 0 - 99 mg/dL  Glucose, capillary     Status: Abnormal   Collection Time: 05/17/15 11:13 AM  Result  Value Ref Range   Glucose-Capillary 111 (H) 65 - 99 mg/dL   Comment 1 Notify RN    Comment 2 Document in Chart    Ct Head Wo Contrast  05/16/2015  CLINICAL DATA:  81 year old diabetic hypertensive female with generalize weakness. Initial encounter. EXAM: CT HEAD WITHOUT CONTRAST TECHNIQUE: Contiguous axial images were obtained from the base of the skull through the vertex without intravenous contrast. COMPARISON:  03/05/2015. FINDINGS: No intracranial hemorrhage. New from the prior examination is linear low density superior left paracentral pontine region. Question small acute infarct versus result of streak artifact. Remote basal ganglia infarcts bilaterally. Remote left corona radiata infarct. Prominent small vessel disease type changes. Global atrophy without hydrocephalus. Anterior frontal 1.9 cm calcified mass consistent with meningioma unchanged. No significant surrounding vasogenic edema. Mastoid air cells, middle ear cavities and visualized paranasal sinuses are clear. Post left lens replacement otherwise orbital structures unremarkable. IMPRESSION: No intracranial hemorrhage. New from the prior examination is linear low density superior left paracentral pontine region. Question small acute infarct versus result of streak artifact. Remote basal ganglia infarcts bilaterally. Remote left corona radiata infarct. Prominent small vessel disease type changes. Global atrophy without hydrocephalus. Anterior frontal 1.9 cm calcified mass consistent with meningioma unchanged. No significant  surrounding vasogenic edema. Electronically Signed   By: Genia Del M.D.   On: 05/16/2015 16:28   Dg Chest Portable 1 View  05/16/2015  CLINICAL DATA:  81 year old female with generalized weakness and decreased appetite EXAM: PORTABLE CHEST 1 VIEW COMPARISON:  Prior chest x-ray 04/26/2015 FINDINGS: Stable borderline cardiomegaly with left heart enlargement. Atherosclerotic calcifications again noted in the transverse  aorta. Unchanged position of left subclavian approach cardiac rhythm maintenance device with leads overlying the right atrium and right ventricle. Calcified granuloma in the left lower lobe remains unchanged. Dense calcification is also present surrounding the mitral valve annulus. The lungs are clear. No focal airspace consolidation, pleural effusion, pneumothorax or evidence of pulmonary edema. No suspicious pulmonary mass or nodule. Stable mild central bronchitic changes and interstitial prominence. No acute osseous abnormality. IMPRESSION: Stable chest x-ray without evidence of acute cardiopulmonary process. Electronically Signed   By: Jacqulynn Cadet M.D.   On: 05/16/2015 15:15    Assessment/Plan: Diagnosis: left pontine infarct with right hemiparesis 1. Does the need for close, 24 hr/day medical supervision in concert with the patient's rehab needs make it unreasonable for this patient to be served in a less intensive setting? Yes 2. Co-Morbidities requiring supervision/potential complications: dm2, htn, bppv, anemia 3. Due to bladder management, bowel management, safety, skin/wound care, disease management, medication administration, pain management and patient education, does the patient require 24 hr/day rehab nursing? Yes 4. Does the patient require coordinated care of a physician, rehab nurse, PT (1-2 hrs/day, 5 days/week), OT (1-2 hrs/day, 5 days/week) and SLP (1-2 hrs/day, 5 days/week) to address physical and functional deficits in the context of the above medical diagnosis(es)? Yes Addressing deficits in the following areas: balance, endurance, locomotion, strength, transferring, bowel/bladder control, bathing, dressing, feeding, grooming, toileting, cognition, speech and psychosocial support 5. Can the patient actively participate in an intensive therapy program of at least 3 hrs of therapy per day at least 5 days per week? Yes 6. The potential for patient to make measurable gains while  on inpatient rehab is good 7. Anticipated functional outcomes upon discharge from inpatient rehab are supervision and min assist  with PT, supervision and min assist with OT, supervision and min assist with SLP. 8. Estimated rehab length of stay to reach the above functional goals is: 13-17 days 9. Does the patient have adequate social supports and living environment to accommodate these discharge functional goals? Yes and Potentially 10. Anticipated D/C setting: Home 11. Anticipated post D/C treatments: [place] therapy 12. Overall Rehab/Functional Prognosis: good  RECOMMENDATIONS: This patient's condition is appropriate for continued rehabilitative care in the following setting: potentially CIR Patient has agreed to participate in recommended program. Potentially Note that insurance prior authorization may be required for reimbursement for recommended care.  Comment: Need to establish caregivers/expected dc goals with family. Rehab Admissions Coordinator to follow up.  Thanks,  [name], MD, Mellody Drown     05/17/2015

## 2015-05-17 NOTE — Evaluation (Signed)
Occupational Therapy Evaluation Patient Details Name: Briana Thornton MRN: [id number] DOB: 09/25/1926 Today's Date: 05/17/2015    History of Present Illness 81 y.o. female with a past medical history significant for NIDDM, HTN, CHB with pacemaker, and dementia who presents with one week progressive weakness, foot dragging, difficulty swallowing, and numbness. CT suggests possible left pontine infarct. Remote basal ganglia infarcts bilaterally. Remote left corona infarct. D/Cd from SNF and living with family last three weeks.     Clinical Impression   Pt admitted with above. Pt receiving assist with ADLs, PTA. Feel pt will benefit from acute OT to increase independence and strength prior to d/c. Recommending CIR for rehab.    Follow Up Recommendations  CIR    Equipment Recommendations  Other (comment) (defer to next venue)    Recommendations for Other Services Rehab consult     Precautions / Restrictions Precautions Precautions: Fall Restrictions Weight Bearing Restrictions: No      Mobility Bed Mobility               General bed mobility comments: not assessed  Transfers Overall transfer level: Needs assistance Equipment used: Rolling walker (2 wheeled) Transfers: Sit to/from Stand Sit to Stand: Max assist         General transfer comment: heavy assist to stand and assist given to scoot further to edge of chair.    Balance        Assist for standing balance as well as used RW.                                    ADL Overall ADL's : Needs assistance/impaired                 Upper Body Dressing : Moderate assistance;Sitting   Lower Body Dressing: Maximal assistance;Sit to/from stand   Toilet Transfer: Maximal assistance;RW (sit to stand from chair)             General ADL Comments: Discussed d/c recommendation.     Vision     Perception     Praxis      Pertinent Vitals/Pain Pain Assessment: Faces Faces Pain  Scale: Hurts a little bit Pain Location: appeared to be in right shoulder or back area? when OT raised shoulder  Pain Intervention(s): Monitored during session;Limited activity within patient's tolerance     Hand Dominance     Extremity/Trunk Assessment Upper Extremity Assessment Upper Extremity Assessment:  (around 2+/5 bilateral shoulder flexion)   Lower Extremity Assessment Lower Extremity Assessment: Defer to PT evaluation       Communication Communication Communication:  (reports expressive difficulties)   Cognition Arousal/Alertness: Awake/alert Behavior During Therapy: WFL for tasks assessed/performed Overall Cognitive Status: No family/caregiver present to determine baseline cognitive functioning                     General Comments       Exercises       Shoulder Instructions      Home Living Family/patient expects to be discharged to:: Unsure Living Arrangements: Children Available Help at Discharge: Family Type of Home: House Home Access: Ramped entrance     Home Layout: One level     Bathroom Shower/Tub: Walk-in shower         Home Equipment: Environmental consultant - 2 wheels;Wheelchair - Brewing technologist - built in   Additional Comments: resides at daughter's home  Lives With: Family  Prior Functioning/Environment Level of Independence: Needs assistance  Gait / Transfers Assistance Needed: left SNF  weeks ago walking modified independent with RW; over past week progressivley weaker to point of non-ambulatory and using w/c ADL's / Homemaking Assistance Needed: assist with bathing and dressing        OT Diagnosis: Generalized weakness   OT Problem List: Decreased strength;Impaired balance (sitting and/or standing);Decreased activity tolerance;Pain;Impaired UE functional use;Decreased knowledge of use of DME or AE;Decreased knowledge of precautions;Decreased range of motion   OT Treatment/Interventions: Self-care/ADL training;Therapeutic  exercise;DME and/or AE instruction;Therapeutic activities;Patient/family education;Balance training;Cognitive remediation/compensation    OT Goals(Current goals can be found in the care plan section) Acute Rehab OT Goals Patient Stated Goal: not stated OT Goal Formulation: With patient Time For Goal Achievement: 05/24/15 Potential to Achieve Goals: Good ADL Goals Pt Will Perform Lower Body Bathing: with min assist;with adaptive equipment;sitting/lateral leans Pt Will Perform Upper Body Dressing: with set-up;sitting;with supervision Pt Will Perform Lower Body Dressing: with adaptive equipment;sitting/lateral leans;with mod assist Pt Will Transfer to Toilet: ambulating;bedside commode;with mod assist Additional ADL Goal #1: Pt will be supervision level for HEP for bilateral UEs to increase strength.  OT Frequency: Min 2X/week   Barriers to D/C:            Co-evaluation              End of Session Equipment Utilized During Treatment: Rolling walker;Gait belt  Activity Tolerance: Patient tolerated treatment well Patient left: in chair;with call bell/phone within reach;with chair alarm set   Time: [id number] OT Time Calculation (min): 15 min Charges:  OT General Charges $OT Visit: 1 Procedure OT Evaluation $Initial OT Evaluation Tier I: 1 Procedure G-CodesBenito Mccreedy OTR/L [id number] 05/17/2015, 4:50 PM

## 2015-05-17 NOTE — Progress Notes (Signed)
Utilization review completed. Ceaira Ernster, RN, BSN. 

## 2015-05-17 NOTE — Progress Notes (Addendum)
PROGRESS NOTE    Briana Thornton [id number] DOB: [birth year] DOA: 05/16/2015 PCP: Penni Homans, MD  HPI/Brief narrative 81 y.o. female with a past medical history significant for NIDDM, HTN, CHB with pacemaker, and dementia who presents with one week progressive weakness, foot dragging, difficulty swallowing, and numbness.  The patient was discharged from a nursing home and has been living with her family for the last 3 weeks with in-home PT and OT. Over the last 5 days, her daughter has noticed that she has gotten weaker and weaker. She was previously able to ambulate with a walker and transfer by herself, but over the last few days she requires assistance with all transfers, needs to use a wheelchair, and seems to be dragging her legs. The family are unclear if this is one leg or both legs (it seems that it is both legs), and the patient is unable to specify if her numbness is on one side or both sides. Because her leg weakness was so dramatically worse today, the patient daughter brought her to the ER.  In the ED, patient had a noncontrasted CT that suggested possible LEFT pontine infarct. An MRI could not be performed because the patient has a pacemaker. The patient was evaluated by neurology and [facility] was asked to admit for suspected stroke.   Assessment/Plan:  Possible acute stroke - Resultant lower extremity weakness/ataxia - CT head 05/16/15: Linear low density superior left paracentral pontine region-? Small acute infarct versus streak artifact. Unchanged meningioma. Repeat CT head to confirm stroke. Cannot do MRI secondary to pacemaker. - Carotid Dopplers: Canceled. Neurology checking CTA head and neck - 2-D echo: Pending - LDL 55 - Hemoglobin A1c: 5.5 in November - No antithrombotic spread to admission, now on aspirin 325 MG daily - Therapies recommendation: CIR - Neurology input appreciated: Not sure if this is truly a stroke or progressive white matter disease from  aging.  Essential hypertension - Allow for permissive hypertension. Stable  Hyperlipidemia - LDL 57, goal <70 - Resumed home Lipitor 10 MG daily  Type II DM - Hemoglobin A1c 5.5 in November, goal <7 - Controlled. Hold Metformin, because she is getting contrast. SSI  Dementia - Mental status at this point probably. Continue Donepezil  Anemia - stable  Meningioma - unchanged per CT    DVT prophylaxis:SCD's Code Status: DNR Family Communication: None at bedside Disposition Plan: DC possibly to CIR on 12/21   Consultants:  Neurology  Rehab MD  Procedures:  None  Antibiotics:  None   Subjective: Denies complaints.  Objective: Filed Vitals:   05/17/15 0100 05/17/15 0300 05/17/15 0453 05/17/15 0651  BP: 126/52 110/99 137/76 155/69  Pulse: 64 60 60 66  Temp: 97.5 F (36.4 C) 97.6 F (36.4 C) 98.1 F (36.7 C) 97.5 F (36.4 C)  TempSrc: Oral Axillary Axillary Axillary  Resp: 16 18 16 18   Height:      Weight:      SpO2: 99% 98% 97% 99%   No intake or output data in the 24 hours ending 05/17/15 0736 Filed Weights   05/16/15 2100  Weight: 52.3 kg (115 lb 4.8 oz)     Exam:  General exam: Pleasantly elderly female seen sitting up in chair eating breakfast this am. Respiratory system: Clear. No increased work of breathing. Cardiovascular system: S1 & S2 heard, RRR. No JVD, murmurs, gallops, clicks or pedal edema. Tele: AV paced rythm Gastrointestinal system: Abdomen is nondistended, soft and nontender. Normal bowel sounds heard. Central nervous system: Alert  and oriented. No focal neurological deficits. Extremities: Symmetric 5 x 5 power.   Data Reviewed: Basic Metabolic Panel:  Recent Labs Lab 05/16/15 1448 05/16/15 2210  NA 144 144  K 4.0 3.8  CL 107 110  CO2 30 26  GLUCOSE 96 116*  BUN 23* 19  CREATININE 0.91 0.90  CALCIUM 9.2 9.1   Liver Function Tests:  Recent Labs Lab 05/16/15 1448 05/16/15 2210  AST 18 17  ALT 10* 10*   ALKPHOS 74 71  BILITOT 0.8 0.9  PROT 6.6 5.9*  ALBUMIN 3.7 3.2*   No results for input(s): LIPASE, AMYLASE in the last 168 hours. No results for input(s): AMMONIA in the last 168 hours. CBC:  Recent Labs Lab 05/16/15 1448 05/16/15 2210  WBC 8.0 8.5  HGB 12.1 11.9*  HCT 39.6 37.7  MCV 93.0 92.9  PLT 248 219   Cardiac Enzymes: No results for input(s): CKTOTAL, CKMB, CKMBINDEX, TROPONINI in the last 168 hours. BNP (last 3 results) No results for input(s): PROBNP in the last 8760 hours. CBG:  Recent Labs Lab 05/16/15 2151 05/17/15 0617  GLUCAP 90 74    No results found for this or any previous visit (from the past 240 hour(s)).         Studies: Ct Head Wo Contrast  05/16/2015  CLINICAL DATA:  81 year old diabetic hypertensive female with generalize weakness. Initial encounter. EXAM: CT HEAD WITHOUT CONTRAST TECHNIQUE: Contiguous axial images were obtained from the base of the skull through the vertex without intravenous contrast. COMPARISON:  03/05/2015. FINDINGS: No intracranial hemorrhage. New from the prior examination is linear low density superior left paracentral pontine region. Question small acute infarct versus result of streak artifact. Remote basal ganglia infarcts bilaterally. Remote left corona radiata infarct. Prominent small vessel disease type changes. Global atrophy without hydrocephalus. Anterior frontal 1.9 cm calcified mass consistent with meningioma unchanged. No significant surrounding vasogenic edema. Mastoid air cells, middle ear cavities and visualized paranasal sinuses are clear. Post left lens replacement otherwise orbital structures unremarkable. IMPRESSION: No intracranial hemorrhage. New from the prior examination is linear low density superior left paracentral pontine region. Question small acute infarct versus result of streak artifact. Remote basal ganglia infarcts bilaterally. Remote left corona radiata infarct. Prominent small vessel disease  type changes. Global atrophy without hydrocephalus. Anterior frontal 1.9 cm calcified mass consistent with meningioma unchanged. No significant surrounding vasogenic edema. Electronically Signed   By: Genia Del M.D.   On: 05/16/2015 16:28   Dg Chest Portable 1 View  05/16/2015  CLINICAL DATA:  81 year old female with generalized weakness and decreased appetite EXAM: PORTABLE CHEST 1 VIEW COMPARISON:  Prior chest x-ray 04/26/2015 FINDINGS: Stable borderline cardiomegaly with left heart enlargement. Atherosclerotic calcifications again noted in the transverse aorta. Unchanged position of left subclavian approach cardiac rhythm maintenance device with leads overlying the right atrium and right ventricle. Calcified granuloma in the left lower lobe remains unchanged. Dense calcification is also present surrounding the mitral valve annulus. The lungs are clear. No focal airspace consolidation, pleural effusion, pneumothorax or evidence of pulmonary edema. No suspicious pulmonary mass or nodule. Stable mild central bronchitic changes and interstitial prominence. No acute osseous abnormality. IMPRESSION: Stable chest x-ray without evidence of acute cardiopulmonary process. Electronically Signed   By: Jacqulynn Cadet M.D.   On: 05/16/2015 15:15        Scheduled Meds: . aspirin  300 mg Rectal Daily   Or  . aspirin  325 mg Oral Daily  . atorvastatin  10 mg  Oral q1800  . donepezil  10 mg Oral Daily  . metFORMIN  500 mg Oral Q breakfast   Continuous Infusions:   Principal Problem:   CVA (cerebral infarction) Active Problems:   Essential hypertension   Hyperlipidemia   Pacemaker-[employer]   Dementia without behavioral disturbance   Controlled type 2 diabetes mellitus with diabetic nephropathy, without long-term current use of insulin (HCC)   Acute ischemic stroke ([place])    Time spent: 42 minutes    Legend Tumminello, MD, FACP, FHM. [facility] Pager [phone]  If 7PM-7AM, please  contact night-coverage [email or web address] Password [email or web address] 05/17/2015, 7:36 AM    LOS: 1 day

## 2015-05-18 ENCOUNTER — Inpatient Hospital Stay (HOSPITAL_COMMUNITY): Payer: Medicare Other

## 2015-05-18 ENCOUNTER — Encounter (HOSPITAL_COMMUNITY): Payer: Self-pay | Admitting: Physical Medicine & Rehabilitation

## 2015-05-18 ENCOUNTER — Encounter (HOSPITAL_COMMUNITY): Payer: Self-pay | Admitting: Radiology

## 2015-05-18 ENCOUNTER — Inpatient Hospital Stay (HOSPITAL_COMMUNITY)
Admission: RE | Admit: 2015-05-18 | Discharge: 2015-05-28 | DRG: 065 | Disposition: A | Discharge status: Home Health | Payer: Medicare Other | Source: Intra-hospital | Attending: Physical Medicine & Rehabilitation | Admitting: Physical Medicine & Rehabilitation

## 2015-05-18 DIAGNOSIS — R269 Unspecified abnormalities of gait and mobility: Secondary | ICD-10-CM | POA: Diagnosis present

## 2015-05-18 DIAGNOSIS — E440 Moderate protein-calorie malnutrition: Secondary | ICD-10-CM

## 2015-05-18 DIAGNOSIS — I639 Cerebral infarction, unspecified: Secondary | ICD-10-CM | POA: Diagnosis present

## 2015-05-18 DIAGNOSIS — G8191 Hemiplegia, unspecified affecting right dominant side: Secondary | ICD-10-CM | POA: Diagnosis present

## 2015-05-18 DIAGNOSIS — E8809 Other disorders of plasma-protein metabolism, not elsewhere classified: Secondary | ICD-10-CM | POA: Diagnosis present

## 2015-05-18 DIAGNOSIS — E46 Unspecified protein-calorie malnutrition: Secondary | ICD-10-CM | POA: Insufficient documentation

## 2015-05-18 DIAGNOSIS — I739 Peripheral vascular disease, unspecified: Secondary | ICD-10-CM | POA: Diagnosis present

## 2015-05-18 DIAGNOSIS — E876 Hypokalemia: Secondary | ICD-10-CM | POA: Diagnosis present

## 2015-05-18 DIAGNOSIS — E1121 Type 2 diabetes mellitus with diabetic nephropathy: Secondary | ICD-10-CM

## 2015-05-18 DIAGNOSIS — I6350 Cerebral infarction due to unspecified occlusion or stenosis of unspecified cerebral artery: Secondary | ICD-10-CM

## 2015-05-18 DIAGNOSIS — E785 Hyperlipidemia, unspecified: Secondary | ICD-10-CM | POA: Diagnosis present

## 2015-05-18 DIAGNOSIS — Z950 Presence of cardiac pacemaker: Secondary | ICD-10-CM | POA: Diagnosis present

## 2015-05-18 DIAGNOSIS — I10 Essential (primary) hypertension: Secondary | ICD-10-CM | POA: Diagnosis present

## 2015-05-18 DIAGNOSIS — E1142 Type 2 diabetes mellitus with diabetic polyneuropathy: Secondary | ICD-10-CM | POA: Diagnosis present

## 2015-05-18 DIAGNOSIS — G459 Transient cerebral ischemic attack, unspecified: Principal | ICD-10-CM

## 2015-05-18 DIAGNOSIS — R0989 Other specified symptoms and signs involving the circulatory and respiratory systems: Secondary | ICD-10-CM | POA: Insufficient documentation

## 2015-05-18 DIAGNOSIS — I6789 Other cerebrovascular disease: Secondary | ICD-10-CM

## 2015-05-18 DIAGNOSIS — D649 Anemia, unspecified: Secondary | ICD-10-CM | POA: Diagnosis present

## 2015-05-18 DIAGNOSIS — F0390 Unspecified dementia without behavioral disturbance: Secondary | ICD-10-CM | POA: Diagnosis present

## 2015-05-18 HISTORY — DX: Type 2 diabetes mellitus with diabetic polyneuropathy: E11.42

## 2015-05-18 LAB — GLUCOSE, CAPILLARY
GLUCOSE-CAPILLARY: 128 mg/dL — AB (ref 65–99)
Glucose-Capillary: 87 mg/dL (ref 65–99)
Glucose-Capillary: 109 mg/dL — ABNORMAL HIGH (ref 65–99)

## 2015-05-18 LAB — HEMOGLOBIN A1C
HEMOGLOBIN A1C: 5.8 % — AB (ref 4.8–5.6)
MEAN PLASMA GLUCOSE: 120 mg/dL

## 2015-05-18 MED ORDER — ONDANSETRON HCL 4 MG/2ML IJ SOLN: 4.0000 mg | Freq: Four times a day (QID) | INTRAMUSCULAR | Status: DC | PRN

## 2015-05-18 MED ORDER — ATORVASTATIN CALCIUM 10 MG PO TABS: 10.0000 mg | ORAL_TABLET | Freq: Every day | ORAL | Status: DC

## 2015-05-18 MED ORDER — ONDANSETRON HCL 4 MG PO TABS: 4.0000 mg | ORAL_TABLET | Freq: Four times a day (QID) | ORAL | Status: DC | PRN

## 2015-05-18 MED ORDER — ASPIRIN 300 MG RE SUPP: 300.0000 mg | Freq: Every day | RECTAL | Status: DC

## 2015-05-18 MED ORDER — IOHEXOL 350 MG/ML SOLN
50.0000 mL | Freq: Once | INTRAVENOUS | Status: AC | PRN
  Administered 2015-05-18: 50 mL via INTRAVENOUS

## 2015-05-18 MED ORDER — ENSURE ENLIVE PO LIQD
237.0000 mL | ORAL | Status: DC
  Filled 2015-05-18: qty 237

## 2015-05-18 MED ORDER — SORBITOL 70 % SOLN
30.0000 mL | Freq: Every day | Status: DC | PRN
Start: 2015-05-18 — End: 2015-05-28
  Administered 2015-05-22 – 2015-05-26 (×2): 30 mL via ORAL
  Filled 2015-05-18 (×2): qty 30

## 2015-05-18 MED ORDER — ENSURE ENLIVE PO LIQD: 237.0000 mL | ORAL | Status: DC

## 2015-05-18 MED ORDER — INSULIN ASPART 100 UNIT/ML ~~LOC~~ SOLN
0.0000 [IU] | Freq: Three times a day (TID) | SUBCUTANEOUS | Status: DC
  Administered 2015-05-19 (×2): 1 [IU] via SUBCUTANEOUS
  Administered 2015-05-23: 2 [IU] via SUBCUTANEOUS

## 2015-05-18 MED ORDER — ACETAMINOPHEN 325 MG PO TABS
325.0000 mg | ORAL_TABLET | ORAL | Status: DC | PRN
  Filled 2015-05-18: qty 2

## 2015-05-18 MED ORDER — ATORVASTATIN CALCIUM 10 MG PO TABS
10.0000 mg | ORAL_TABLET | Freq: Every day | ORAL | Status: DC
  Administered 2015-05-18 – 2015-05-27 (×9): 10 mg via ORAL
  Filled 2015-05-18 (×9): qty 1

## 2015-05-18 MED ORDER — ASPIRIN EC 81 MG PO TBEC: 81.0000 mg | DELAYED_RELEASE_TABLET | Freq: Every day | ORAL | Status: DC

## 2015-05-18 MED ORDER — DONEPEZIL HCL 10 MG PO TABS
10.0000 mg | ORAL_TABLET | Freq: Every day | ORAL | Status: DC
  Administered 2015-05-19 – 2015-05-28 (×10): 10 mg via ORAL
  Filled 2015-05-18 (×10): qty 1

## 2015-05-18 MED ORDER — ASPIRIN 325 MG PO TABS
325.0000 mg | ORAL_TABLET | Freq: Every day | ORAL | Status: DC
  Administered 2015-05-18 – 2015-05-27 (×10): 325 mg via ORAL
  Filled 2015-05-18 (×10): qty 1

## 2015-05-18 MED ORDER — SENNOSIDES-DOCUSATE SODIUM 8.6-50 MG PO TABS
1.0000 | ORAL_TABLET | Freq: Every evening | ORAL | Status: DC | PRN
  Administered 2015-05-18 – 2015-05-24 (×2): 1 via ORAL
  Filled 2015-05-18 (×2): qty 1

## 2015-05-18 NOTE — Progress Notes (Signed)
Briana Thornton Rehab Admission Coordinator Signed Physical Medicine and Rehabilitation PMR Pre-admission 05/18/2015 1:55 PM  Related encounter: ED to Hosp-Admission (Current) from 05/16/2015 in [place] All Collapse All   PMR Admission Coordinator Pre-Admission Assessment  Patient: Briana Thornton is an 81 y.o., female MRN: [id number] DOB: Jan 11, 1927 Height: 5\' 2"  (157.5 cm) Weight: 52.3 kg (115 lb 4.8 oz)  Insurance Information HMO: PPO: PCP: IPA: 80/20: OTHER:  PRIMARY: Medicare A and B Policy#: [id number] Subscriber: self CM Name: Phone#: Fax#:  Pre-Cert#: Employer: retired Benefits: Verified online with [employer] 05/18/15 Phone #: Name:  Eff. Date: 05/28/93 Deduct: $1288 Out of Pocket Max: none Life Max: unlimited CIR: 100% SNF: Days 1-20 100%; Days 21-100 80% Outpatient: 80% Co-Pay: 20% Home Health: 100% Co-Pay: none DME: 80% Co-Pay: 20% Providers: patient's choice SECONDARY: BCBS Policy#: [id number] Subscriber: self CM Name: Phone#: Fax#:  Pre-Cert#: Employer:  Benefits: Phone #: Name:  Eff. Date: Deduct: Out of Pocket Max: Life Max:  CIR: SNF:  Outpatient: Co-Pay:  Home Health: Co-Pay:  DME: Co-Pay:   Medicaid Application Date: Case Manager:  Disability Application Date: Case Worker:   Emergency Contact Information Contact Information    Name Relation Home Work Collins Daughter [phone]   Dorian Heckle [phone]     Maelin, Buggy   [phone]     Current Medical History  Patient Admitting Diagnosis:  Left pontine infarct with right hemiparesis History of Present Illness: Briana Thornton is a 81 y.o. right handed female with history of hypertension, diabetes mellitus with peripheral neuropathy,CHB with pacemaker, dementia maintained on Aricept. Patient lives with daughter who is a Marine scientist at [employer] and son-in-law. Patient used a walker prior to admission. Family assistance is needed. Recently discharged from [facility] 3 weeks ago for pneumonia. Presented 05/16/2015 with right sided weakness 1 week. Cranial CT scan shows early left pontine infarct as well as remote basal ganglia infarcts bilaterally, remote left corona radiata infarct.Marland Kitchen MRI not completed due to pacemaker. Patient did not receive TPA. Echocardiogram CT angiogram of head and neck with no large vessel occlusion or stenosis. Presently maintained on aspirin for CVA prophylaxis. Tolerating a regular consistency diet. Physical and occupational therapy evaluations completed 05/17/2015 with recommendations of physical medicine rehabilitation consult. Patient was admitted for a comprehensive rehabilitation program.  NIH Total: 5    Past Medical History  Past Medical History  Diagnosis Date  . Chicken pox as a child  . Measles as a child  . IBS (irritable bowel syndrome)   . Diabetes mellitus 47    type 2  . Hyperlipidemia 72  . Hypertension 72  . Vision loss of right eye   . Vertigo 2010    benign  . Calcification of cartilage     ear  . Cardiovascular system disease 07/02/2011  . Hemorrhoid 07/02/2011  . Osteoporosis 07/02/2011  . Weight loss 07/31/2011  . Anemia 05/22/2012  . Broken wrist 10-22-11    left  . Depression 05/22/2012  . Dermatitis 05/22/2012    Right neck    Family History  family history includes Cancer in her brother and maternal grandfather; Diabetes in her daughter, mother, and son; Hypertension in her  mother.  Prior Rehab/Hospitalizations:  Has the patient had major surgery during 100 days prior to admission? No  Current Medications   Current facility-administered medications:  . aspirin suppository 300 mg, 300 mg, Rectal, Daily **OR**  aspirin tablet 325 mg, 325 mg, Oral, Daily, Edwin Dada, MD, 325 mg at 05/17/15 2018 . atorvastatin (LIPITOR) tablet 10 mg, 10 mg, Oral, q1800, Edwin Dada, MD, 10 mg at 05/17/15 1744 . donepezil (ARICEPT) tablet 10 mg, 10 mg, Oral, Daily, Edwin Dada, MD, 10 mg at 05/18/15 1146 . feeding supplement (ENSURE ENLIVE) (ENSURE ENLIVE) liquid 237 mL, 237 mL, Oral, Q24H, Reanne J Barbato, RD . insulin aspart (novoLOG) injection 0-9 Units, 0-9 Units, Subcutaneous, TID WC, Modena Jansky, MD, 0 Units at 05/18/15 0800 . senna-docusate (Senokot-S) tablet 1 tablet, 1 tablet, Oral, QHS PRN, Edwin Dada, MD  Patients Current Diet: Diet heart healthy/carb modified Room service appropriate?: Yes; Fluid consistency:: Thin  Precautions / Restrictions Precautions Precautions: Fall Restrictions Weight Bearing Restrictions: No   Has the patient had 2 or more falls or a fall with injury in the past year?No, patient had a fall that resulted in a hip fracture about 2 years ago.   Prior Activity Level Household: Patient lives with her daughter, son-in-law, grown grandson and granddaughter-in-law. They converted a garage for her bedroom and bathroom 5 years ago and provided 24/7 stand by assist prior to admission. Patient only leaves the house 1-2 times a month for doctor and hair appointments.   Home Assistive Devices / Equipment Home Assistive Devices/Equipment: Environmental consultant (specify type), Transfer belt, Grab bars in shower, Eyeglasses, Bedside commode/3-in-1 (youth RW) Home Equipment: Environmental consultant - 2 wheels, Wheelchair - manual, Shower seat - built in  Prior Device Use: Indicate devices/aids used by the patient prior to current  illness, exacerbation or injury? Manual wheelchair and Walker  Prior Functional Level Prior Function Level of Independence: Needs assistance (24/7 stand by assist per daughter) Gait / Transfers Assistance Needed: left SNF weeks ago walking modified independent with RW; over past week progressivley weaker to point of non-ambulatory and using w/c ADL's / Homemaking Assistance Needed: assist with bathing and dressing  Self Care: Did the patient need help bathing, dressing, using the toilet or eating? Dependent for bathing, toileting and recent need for assist with dressing, patient able to self-feed once set-up Indoor Mobility: Did the patient need assistance with walking from room to room (with or without device)? Needed some help, stand by assist per daughter   Stairs: Did the patient need assistance with internal or external stairs (with or without device)? Dependent  Functional Cognition: Did the patient need help planning regular tasks such as shopping or remembering to take medications? Dependent  Current Functional Level Cognition  Arousal/Alertness: Awake/alert Overall Cognitive Status: No family/caregiver present to determine baseline cognitive functioning Orientation Level: Oriented to person, Oriented to place, Disoriented to time, Disoriented to situation Attention: Focused Focused Attention: Appears intact Memory: Impaired Memory Impairment: Storage deficit, Retrieval deficit Awareness: Impaired Awareness Impairment: Intellectual impairment Problem Solving: Impaired Problem Solving Impairment: Verbal basic Safety/Judgment: Impaired   Extremity Assessment (includes Sensation/Coordination)  Upper Extremity Assessment: (around 2+/5 bilateral shoulder flexion)  Lower Extremity Assessment: Defer to PT evaluation RLE Deficits / Details: AROM WFL except ankle DF _40 degrees (tight heel cords); quads 3+ RLE Sensation: (denies changes) LLE Deficits / Details: AROM WFL  except ankle DF _40 degrees (tight heel cords); quads 3+ LLE Sensation: (denies changes)    ADLs  Overall ADL's : Needs assistance/impaired Upper Body Dressing : Moderate assistance, Sitting Lower Body Dressing: Maximal assistance, Sit to/from stand Toilet Transfer: Maximal assistance, RW (sit to stand from chair) General ADL Comments: Discussed d/c recommendation.    Mobility  Overal bed mobility: Needs Assistance Bed Mobility: Rolling, Sidelying to Sit Rolling: Mod assist Sidelying to sit: Min assist General bed mobility comments: HOB flat, no rail; incr time with bradykinesia; max cues    Transfers  Overall transfer level: Needs assistance Equipment used: Rolling walker (2 wheeled) Transfers: Sit to/from Stand, [employer] Transfers Sit to Stand: Mod assist Stand pivot transfers: Max assist General transfer comment: incr assist due to posterior lean (partially due to tight heel cords)--less lean than 12/20; difficulty shifting weight onto RLE to allow her to advance LLE; x 2    Ambulation / Gait / Stairs / Wheelchair Mobility  Ambulation/Gait Ambulation/Gait assistance: Mod assist Ambulation Distance (Feet): 10 Feet (toileted, 5) Assistive device: Rolling walker (2 wheeled) Gait Pattern/deviations: Step-to pattern, Decreased stride length, Decreased dorsiflexion - right, Decreased dorsiflexion - left, Leaning posteriorly, Shuffle, Trunk flexed, Narrow base of support General Gait Details: pivotal steps to/from [facility] only Gait velocity interpretation: <1.8 ft/sec, indicative of risk for recurrent falls    Posture / Balance Balance Overall balance assessment: Needs assistance Sitting-balance support: No upper extremity supported, Feet unsupported Sitting balance-[name]: Fair Standing balance support: Bilateral upper extremity supported Standing balance-[name]: Poor Standing balance comment: no righting reactions as she begins to fall posteriorly      Special needs/care consideration BiPAP/CPAP none CPM none Continuous Drip IV n/a Dialysis n/a Life Vest n/a Oxygen n/a Special Bed n/a Trach Size n/a Wound Vac (area) n/a  Skin daughter, who is an Therapist, sports reports right sided coccyx scrape that has been there for 1-2 weeks and is now scabbed over; right thigh irritation that dermatologist has checked; dead right big toe nail; left ankle dry area that again was checked by the dermatologist  Bowel mgmt:n 05/15/15 Bladder mgmt: wore diapers 24/7 prior to admission due to urgency but was mostly continent during the day; incontinent at night Diabetic mgmt: HgbA1c 5.5 in Nov     Previous Home Environment Living Arrangements: Children Lives With: Daughter, Other (Comment) (Son-in-law, grandson and granddaughter-in-law) Available Help at Discharge: Family, Available 24 hours/day Type of Home: House Home Layout: One level, Able to live on main level with bedroom/bathroom (transition/threshold to enter her living area) Home Access: Stairs to enter Entrance Stairs-Rails: Left Entrance Stairs-Number of Steps: 5-6 Bathroom Shower/Tub: Other (comment) (walk-in tub) Bathroom Toilet: (elevated with bedside cammode) Bathroom Accessibility: Yes How Accessible: Accessible via wheelchair, Accessible via walker Home Care Services: Yes Type of Home Care Services: Home PT (with [place]) [place] (if known): [place] Additional Comments: resides at daughter's home  Discharge Living Setting Plans for Discharge Living Setting: Lives with (comment) (daughter and her family ) Type of Home at Discharge: House Discharge Home Layout: One level, Able to live on main level with bedroom/bathroom Discharge Home Access: Stairs to enter Entrance Stairs-Rails: Left Entrance Stairs-Number of Steps: 5-6 Discharge Bathroom Shower/Tub: Other (comment) (walk-in tub) Discharge Bathroom Toilet: Standard (elevated with bedside cammode ) Discharge Bathroom  Accessibility: Yes How Accessible: Accessible via wheelchair, Accessible via walker Does the patient have any problems obtaining your medications?: No  Social/Family/Support Systems Patient Roles: Parent Contact Information: daughter: Butler Denmark [phone] Anticipated Caregiver: daughter and son-in-law: Jennette Banker [phone] Ability/Limitations of Caregiver: son-in-law works from home and daughter is an Therapist, sports at [employer] Availability: 24/7 Discharge Plan Discussed with Primary Caregiver: Yes Is Caregiver In Agreement with Plan?: Yes Does Caregiver/Family have Issues with Lodging/Transportation while Pt is in Rehab?: No   Goals/Additional Needs Patient/Family Goal for Rehab: Supervision-Min assist  OT/PT/SLP Expected length of stay: 13-17 days Cultural Considerations: christian-baptist  Dietary Needs: hearth healthy/carm modified diet Equipment Needs: TBD Special Service Needs: none Additional Information: patient has been sundowning since this hospiatalization due to the unfamiliar souroundings Pt/Family Agrees to Admission and willing to participate: Yes Program Orientation Provided & Reviewed with Pt/Caregiver Including Roles & Responsibilities: Yes Additional Information Needs: none  Decrease burden of Care through IP rehab admission: not anticipated at this time  Possible need for SNF placement upon discharge: not anticipated at this time   Patient Condition: This patient's condition remains as documented in the consult dated 05/17/15, in which the Rehabilitation Physician determined and documented that the patient's condition is appropriate for intensive rehabilitative care in an inpatient rehabilitation facility. Will admit to inpatient rehab today.  Preadmission Screen Completed By: Briana Thornton, 05/18/2015 2:30 PM ______________________________________________________________________  Discussed status with Dr. Posey Pronto on 05/18/15 at 1426 and received  telephone approval for admission today.  Admission Coordinator: Briana Thornton, time 1426/Date 05/18/15          Cosigned by: Ankit Lorie Phenix, MD at 05/18/2015 2:33 PM  Revision History     Date/Time User Provider Type Action   05/18/2015 2:33 PM Ankit Lorie Phenix, MD Physician Cosign   05/18/2015 2:30 PM Briana Thornton Rehab Admission Coordinator Sign

## 2015-05-18 NOTE — Interval H&P Note (Signed)
Briana Thornton was admitted today to Inpatient Rehabilitation with the diagnosis of left pontine infarct secondary to small vessel disease.  The patient's history has been reviewed, patient examined, and there is no change in status.  Patient continues to be appropriate for intensive inpatient rehabilitation.  I have reviewed the patient's chart and labs.  Questions were answered to the patient's satisfaction. The PAPE has been reviewed and assessment remains appropriate.  Briana Thornton 05/18/2015, 8:47 PM

## 2015-05-18 NOTE — Progress Notes (Signed)
Ankit Lorie Phenix, MD Physician Addendum Physical Medicine and Rehabilitation H&P 05/18/2015 6:08 AM  Related encounter: ED to Hosp-Admission (Current) from 05/16/2015 in [place] All Collapse All      Physical Medicine and Rehabilitation Admission H&P   Chief Complaint  Patient presents with  . numbness in legs   : HPI: Ryker Pherigo is a 81 y.o. right handed female with history of hypertension, diabetes mellitus with peripheral neuropathy,CHB with pacemaker, dementia maintained on Aricept. Patient lives with daughter who is a Marine scientist at [employer] and son-in-law. Patient used a walker prior to admission. Family assistance is needed. Recently discharged from [place] 3 weeks ago for pneumonia. Presented 05/16/2015 with right sided weakness 1 week. Cranial CT scan shows early left pontine infarct as well as remote basal ganglia infarcts bilaterally, remote left corona radiata infarct.Marland Kitchen MRI not completed due to pacemaker. Patient did not receive TPA. Echocardiogram CT angiogram of head and neck with no large vessel occlusion or stenosis. Presently maintained on aspirin for CVA prophylaxis. Tolerating a regular consistency diet. Physical and occupational therapy evaluations completed 05/17/2015 with recommendations of physical medicine rehabilitation consult. Patient was admitted for a comprehensive rehabilitation program  ROS Constitutional: Negative for fever and chills.  HENT: Negative for hearing loss.  Eyes:   Decreased vision right eye  Respiratory: Negative for cough and shortness of breath.  Cardiovascular: Negative for chest pain and leg swelling.  Gastrointestinal: Positive for constipation.  Genitourinary: Negative for dysuria and flank pain.  Musculoskeletal: Positive for myalgias and joint pain.  Skin: Negative for rash.  Neurological: Positive for weakness. Negative  for headaches.  Psychiatric/Behavioral: Positive for depression and memory loss.  All other systems reviewed and are negative   Past Medical History  Diagnosis Date  . Chicken pox as a child  . Measles as a child  . IBS (irritable bowel syndrome)   . Diabetes mellitus 47    type 2  . Hyperlipidemia 72  . Hypertension 72  . Vision loss of right eye   . Vertigo 2010    benign  . Calcification of cartilage     ear  . Cardiovascular system disease 07/02/2011  . Hemorrhoid 07/02/2011  . Osteoporosis 07/02/2011  . Weight loss 07/31/2011  . Anemia 05/22/2012  . Broken wrist 10-22-11    left  . Depression 05/22/2012  . Dermatitis 05/22/2012    Right neck   Past Surgical History  Procedure Laterality Date  . Hemorroidectomy  1979  . Knee scoped  2002    right  . Rotator cuff repair  2004    right  . Abdominal hysterectomy  2008    partial still has ovaries  . Lens implant left  2006  . Pacemaker insertion  2010  . Wrist surgery  11-05-11    left wrist  . Intramedullary (im) nail intertrochanteric Right 07/24/2013    Procedure: INTRAMEDULLARY (IM) NAIL INTERTROCHANTRIC; Surgeon: Gearlean Alf, MD; Location: [facility]; Service: Orthopedics; Laterality: Right;   Family History  Problem Relation Age of Onset  . Diabetes Mother     type 2  . Hypertension Mother   . Cancer Brother     lung-smoker  . Diabetes Daughter     pre diabetic  . Diabetes Son     type 2  . Cancer Maternal Grandfather     prostate   Social History:  reports that she has never smoked.  She has never used smokeless tobacco. She reports that she does not drink alcohol or use illicit drugs. Allergies: No Known Allergies Medications Prior to Admission  Medication Sig Dispense Refill  . acetaminophen (TYLENOL) 325 MG tablet Take 2 tablets  (650 mg total) by mouth every 6 (six) hours as needed for mild pain (or Fever >/= 101). 60 tablet 0  . alum & mag hydroxide-simeth (MAALOX/MYLANTA) 200-200-20 MG/5ML suspension Take 30 mLs by mouth every 6 (six) hours as needed for indigestion or heartburn (dyspepsia). 355 mL 0  . atorvastatin (LIPITOR) 10 MG tablet TAKE 1 TABLET (10 MG TOTAL) BY MOUTH DAILY. 90 tablet 3  . bisacodyl (DULCOLAX) 10 MG suppository Place 1 suppository (10 mg total) rectally daily as needed for moderate constipation. 12 suppository 0  . docusate sodium (COLACE) 100 MG capsule Take 100 mg by mouth every other day.     . donepezil (ARICEPT) 10 MG tablet Take 1 tablet (10 mg total) by mouth at bedtime. (Patient taking differently: Take 10 mg by mouth daily. ) 90 tablet 3  . metFORMIN (GLUCOPHAGE-XR) 500 MG 24 hr tablet TAKE 1 TABLET (500 MG TOTAL) BY MOUTH DAILY WITH BREAKFAST. 90 tablet 3  . methylcellulose (ARTIFICIAL TEARS) 1 % ophthalmic solution Place 1 drop into both eyes as needed (dry eyes).     . Multiple Vitamin (MULTIVITAMIN WITH MINERALS) TABS tablet Take 2 tablets by mouth daily.     . polyethylene glycol (MIRALAX / GLYCOLAX) packet Take 17 g by mouth daily as needed for mild constipation. 14 each 0  . Probiotic Product (PROBIOTIC DAILY PO) Take 1 tablet by mouth daily.    . Ipratropium-Albuterol (COMBIVENT RESPIMAT) 20-100 MCG/ACT AERS respimat Inhale 1 puff into the lungs every 6 (six) hours as needed for wheezing or shortness of breath. (Patient not taking: Reported on 04/26/2015)      Home: Home Living Family/patient expects to be discharged to:: Unsure Living Arrangements: Children Available Help at Discharge: Family Type of Home: House Home Access: [place]: One level Bathroom Shower/Tub: [place]: Environmental consultant - 2 wheels, Wheelchair - manual, Civil engineer, contracting - built in Additional Comments: resides at  [facility] home Lives With: Family  Functional History: Prior Function Level of Independence: Needs assistance Gait / Transfers Assistance Needed: left SNF weeks ago walking modified independent with RW; over past week progressivley weaker to point of non-ambulatory and using w/c ADL's / Homemaking Assistance Needed: assist with bathing and dressing  Functional Status:  Mobility: Bed Mobility General bed mobility comments: not assessed Transfers Overall transfer level: Needs assistance Equipment used: Rolling walker (2 wheeled) Transfers: Sit to/from Stand Sit to Stand: Max assist General transfer comment: heavy assist to stand and assist given to scoot further to edge of chair. Ambulation/Gait Ambulation/Gait assistance: Mod assist Ambulation Distance (Feet): 10 Feet (toileted, 5) Assistive device: Rolling walker (2 wheeled) Gait Pattern/deviations: Step-to pattern, Decreased stride length, Decreased dorsiflexion - right, Decreased dorsiflexion - left, Leaning posteriorly, Shuffle, Trunk flexed, Narrow base of support General Gait Details: loses balance posteriorly; requires assist to maneuver RW in all turns; increased difficulty advancing LLE (especially when turning or stepping backwards) Gait velocity interpretation: <1.8 ft/sec, indicative of risk for recurrent falls    ADL: ADL Overall ADL's : Needs assistance/impaired Upper Body Dressing : Moderate assistance, Sitting Lower Body Dressing: Maximal assistance, Sit to/from stand Toilet Transfer: Maximal assistance, RW (sit to stand from chair) General ADL Comments: Discussed d/c recommendation.  Cognition: Cognition Overall Cognitive Status:  No family/caregiver present to determine baseline cognitive functioning Arousal/Alertness: Awake/alert Orientation Level: Oriented to person, Oriented to place, Disoriented to time, Disoriented to situation Attention: Focused Focused Attention: Appears intact Memory:  Impaired Memory Impairment: Storage deficit, Retrieval deficit Awareness: Impaired Awareness Impairment: Intellectual impairment Problem Solving: Impaired Problem Solving Impairment: Verbal basic Safety/Judgment: Impaired Cognition Arousal/Alertness: Awake/alert Behavior During Therapy: WFL for tasks assessed/performed Overall Cognitive Status: No family/caregiver present to determine baseline cognitive functioning  Physical Exam: Blood pressure 173/57, pulse 60, temperature 97.7 F (36.5 C), temperature source Axillary, resp. rate 18, height 5' 2" (1.575 m), weight 52.3 kg (115 lb 4.8 oz), SpO2 99 %. Physical Exam Constitutional: NAD. Frail appearing. Vital signs reviewed.  HENT: Normocephalic, atraumatic Eyes: Conj and EOM normal.  Neck: Normal range of motion. Neck supple. No thyromegaly present.  Cardiovascular: Normal rate and no murmurs.  Respiratory: Effort normal and breath sounds normal. No respiratory distress.  GI: Soft. Bowel sounds are normal. She exhibits no distension.  Neurological: She is alert and oriented x2 with increased time and cues.  Makes good eye contact with examiner.  Follows commands. Limited medical historian.  Motor: RUE 3+/5 proximal to distal.  RLE 2+/5 hip flexion, 3/5 ankle dorsi/plantar flexion.  LUE: 4-/5 proximal to distal LLE 2+ hip flexion, 3+ ankle dorsi/plantar flexion Inconsistent memory and attention  Skin: Skin is warm and dry.  Psychiatric:  Pleasant, cooperative, delayed, confused    Lab Results Last 48 Hours    Results for orders placed or performed during the hospital encounter of 05/16/15 (from the past 48 hour(s))  CBC Status: None   Collection Time: 05/16/15 2:48 PM  Result Value Ref Range   WBC 8.0 4.0 - 10.5 K/uL   RBC 4.26 3.87 - 5.11 MIL/uL   Hemoglobin 12.1 12.0 - 15.0 g/dL   HCT 39.6 36.0 - 46.0 %   MCV 93.0 78.0 - 100.0 fL   MCH 28.4 26.0 - 34.0 pg   MCHC  30.6 30.0 - 36.0 g/dL   RDW 14.4 11.5 - 15.5 %   Platelets 248 150 - 400 K/uL  Comprehensive metabolic panel Status: Abnormal   Collection Time: 05/16/15 2:48 PM  Result Value Ref Range   Sodium 144 135 - 145 mmol/L   Potassium 4.0 3.5 - 5.1 mmol/L   Chloride 107 101 - 111 mmol/L   CO2 30 22 - 32 mmol/L   Glucose, Bld 96 65 - 99 mg/dL   BUN 23 (H) 6 - 20 mg/dL   Creatinine, Ser 0.91 0.44 - 1.00 mg/dL   Calcium 9.2 8.9 - 10.3 mg/dL   Total Protein 6.6 6.5 - 8.1 g/dL   Albumin 3.7 3.5 - 5.0 g/dL   AST 18 15 - 41 U/L   ALT 10 (L) 14 - 54 U/L   Alkaline Phosphatase 74 38 - 126 U/L   Total Bilirubin 0.8 0.3 - 1.2 mg/dL   GFR calc non Af Amer 56 (L) >60 mL/min   GFR calc Af Amer >60 >60 mL/min    Comment: (NOTE) The eGFR has been calculated using the CKD EPI equation. This calculation has not been validated in all clinical situations. eGFR's persistently <60 mL/min signify possible Chronic Kidney Disease.    Anion gap 7 5 - 15  I-stat troponin, ED Status: None   Collection Time: 05/16/15 3:28 PM  Result Value Ref Range   Troponin i, poc 0.01 0.00 - 0.08 ng/mL   Comment 3       Comment: Due to  the release kinetics of cTnI, a negative result within the first hours of the onset of symptoms does not rule out myocardial infarction with certainty. If myocardial infarction is still suspected, repeat the test at appropriate intervals.   Urinalysis, Routine w reflex microscopic (not at [facility]) Status: None   Collection Time: 05/16/15 4:06 PM  Result Value Ref Range   Color, Urine YELLOW YELLOW   APPearance CLEAR CLEAR   Specific Gravity, Urine 1.014 1.005 - 1.030   pH 7.5 5.0 - 8.0   Glucose, UA NEGATIVE NEGATIVE mg/dL   Hgb urine dipstick NEGATIVE NEGATIVE   Bilirubin Urine NEGATIVE NEGATIVE   Ketones, ur NEGATIVE  NEGATIVE mg/dL   Protein, ur NEGATIVE NEGATIVE mg/dL   Nitrite NEGATIVE NEGATIVE   Leukocytes, UA NEGATIVE NEGATIVE    Comment: MICROSCOPIC NOT DONE ON URINES WITH NEGATIVE PROTEIN, BLOOD, LEUKOCYTES, NITRITE, OR GLUCOSE <1000 mg/dL.  Glucose, capillary Status: None   Collection Time: 05/16/15 9:51 PM  Result Value Ref Range   Glucose-Capillary 90 65 - 99 mg/dL   Comment 1 Notify RN    Comment 2 Document in Chart   Comprehensive metabolic panel Status: Abnormal   Collection Time: 05/16/15 10:10 PM  Result Value Ref Range   Sodium 144 135 - 145 mmol/L   Potassium 3.8 3.5 - 5.1 mmol/L   Chloride 110 101 - 111 mmol/L   CO2 26 22 - 32 mmol/L   Glucose, Bld 116 (H) 65 - 99 mg/dL   BUN 19 6 - 20 mg/dL   Creatinine, Ser 0.90 0.44 - 1.00 mg/dL   Calcium 9.1 8.9 - 10.3 mg/dL   Total Protein 5.9 (L) 6.5 - 8.1 g/dL   Albumin 3.2 (L) 3.5 - 5.0 g/dL   AST 17 15 - 41 U/L   ALT 10 (L) 14 - 54 U/L   Alkaline Phosphatase 71 38 - 126 U/L   Total Bilirubin 0.9 0.3 - 1.2 mg/dL   GFR calc non Af Amer 56 (L) >60 mL/min   GFR calc Af Amer >60 >60 mL/min    Comment: (NOTE) The eGFR has been calculated using the CKD EPI equation. This calculation has not been validated in all clinical situations. eGFR's persistently <60 mL/min signify possible Chronic Kidney Disease.    Anion gap 8 5 - 15  CBC Status: Abnormal   Collection Time: 05/16/15 10:10 PM  Result Value Ref Range   WBC 8.5 4.0 - 10.5 K/uL   RBC 4.06 3.87 - 5.11 MIL/uL   Hemoglobin 11.9 (L) 12.0 - 15.0 g/dL   HCT 37.7 36.0 - 46.0 %   MCV 92.9 78.0 - 100.0 fL   MCH 29.3 26.0 - 34.0 pg   MCHC 31.6 30.0 - 36.0 g/dL   RDW 14.4 11.5 - 15.5 %   Platelets 219 150 - 400 K/uL  Glucose, capillary Status: None   Collection Time: 05/17/15 6:17 AM  Result Value Ref Range    Glucose-Capillary 74 65 - 99 mg/dL  Hemoglobin A1c Status: Abnormal   Collection Time: 05/17/15 8:22 AM  Result Value Ref Range   Hgb A1c MFr Bld 5.8 (H) 4.8 - 5.6 %    Comment: (NOTE)  Pre-diabetes: 5.7 - 6.4  Diabetes: >6.4  Glycemic control for adults with diabetes: <7.0    Mean Plasma Glucose 120 mg/dL    Comment: (NOTE) Performed At: [facility] [address] [place], [place] [id number] Lindon Romp MD JQ:[id number]   Lipid panel Status: None   Collection Time: 05/17/15  8:22 AM  Result Value Ref Range   Cholesterol 128 0 - 200 mg/dL   Triglycerides 61 <150 mg/dL   HDL 65 >40 mg/dL   Total CHOL/HDL Ratio 2.0 RATIO   VLDL 12 0 - 40 mg/dL   LDL Cholesterol 51 0 - 99 mg/dL    Comment:   Total Cholesterol/HDL:CHD Risk Coronary Heart Disease Risk Table  Men Women 1/2 Average Risk 3.4 3.3 Average Risk 5.0 4.4 2 X Average Risk 9.6 7.1 3 X Average Risk 23.4 11.0   Use the calculated Patient Ratio above and the CHD Risk Table to determine the patient's CHD Risk.   ATP III CLASSIFICATION (LDL): <100 mg/dL Optimal 100-129 mg/dL Near or Above  Optimal 130-159 mg/dL Borderline 160-189 mg/dL High >190 mg/dL Very High   Glucose, capillary Status: Abnormal   Collection Time: 05/17/15 11:13 AM  Result Value Ref Range   Glucose-Capillary 111 (H) 65 - 99 mg/dL   Comment 1 Notify RN    Comment 2 Document in Chart   Glucose, capillary Status: Abnormal   Collection Time: 05/17/15 4:06 PM  Result Value Ref Range   Glucose-Capillary 127 (H) 65 - 99 mg/dL  Glucose, capillary Status: None   Collection Time: 05/17/15 10:15 PM  Result Value Ref Range   Glucose-Capillary 90 65 - 99 mg/dL   Comment 1 Notify RN     Comment 2 Document in Chart       Imaging Results (Last 48 hours)    Ct Angio Head W/cm &/or Wo Cm  05/18/2015 CLINICAL DATA: Weakness for several days, old compromise walking at baseline. Gait imbalance. Possible LEFT pontine infarct. History of hypertension, hyperlipidemia. EXAM: CT ANGIOGRAPHY HEAD AND NECK TECHNIQUE: Multidetector CT imaging of the head and neck was performed using the standard protocol during bolus administration of intravenous contrast. Multiplanar CT image reconstructions and MIPs were obtained to evaluate the vascular anatomy. Carotid stenosis measurements (when applicable) are obtained utilizing NASCET criteria, using the distal internal carotid diameter as the denominator. CONTRAST: 23m OMNIPAQUE IOHEXOL 350 MG/ML SOLN 50 cc Omnipaque 350 COMPARISON: CT head May 16, 2015 FINDINGS: CT HEAD The ventricles and sulci are normal for age. No intraparenchymal hemorrhage, mass effect nor midline shift. Confluent supratentorial white matter hypodensities. Subcentimeter LEFT pontine white matter hypodensity persists. No acute large vascular territory infarcts. Old small LEFT basal ganglia lacunar infarcts. No abnormal intracranial enhancement. No abnormal extra-axial fluid collections. 19 mm anterior cranial fossa meningioma with local mass effect. Basal cisterns are patent. Severe calcific atherosclerosis of the carotid siphons. No skull fracture. The included ocular globes and orbital contents are non-suspicious. Status post LEFT ocular lens implant. The mastoid aircells and included paranasal sinuses are well-aerated. Severe bilateral temporomandibular osteoarthrosis. CTA NECK Aortic arch: Normal appearance of the thoracic arch, normal branch pattern. Moderate calcific atherosclerosis. The origins of the innominate, left Common carotid artery and subclavian artery are widely patent. Right carotid system: Common carotid artery is widely patent, coursing in a straight line  fashion. Normal appearance of the carotid bifurcation without hemodynamically significant stenosis by NASCET criteria. Mild eccentric calcific atherosclerosis of the carotid bulb. Normal appearance of the included internal carotid artery. Left carotid system: Common carotid artery is widely patent, coursing in a straight line fashion. Normal appearance of the carotid bifurcation without hemodynamically significant stenosis by NASCET criteria. Mild eccentric calcific atherosclerosis of the carotid bulb. Normal appearance of the included internal carotid artery. Vertebral arteries:RIGHT vertebral artery is dominant. Normal appearance of the vertebral arteries, which  appear widely patent. Skeleton: No acute osseous process though bone windows have not been submitted. Other neck: Soft tissues of the neck are nonacute though, not tailored for evaluation. 18 mm nodule isodense to thyroid contiguous with the RIGHT posterior lobe. CTA HEAD Anterior circulation: Normal appearance of the cervical internal carotid arteries, petrous, cavernous and supra clinoid internal carotid arteries. Widely patent anterior communicating artery. LEFT A1 segment is developmentally dominant. Patent anterior and middle cerebral arteries. Mild stenosis LEFT M2 segment. Posterior circulation: RIGHT vertebral artery is dominant with normal appearance of the vertebral arteries, vertebrobasilar junction and basilar artery, as well as main branch vessels. Moderate tandem stenosis bilateral posterior cerebral artery. No large vessel occlusion, hemodynamically significant stenosis, dissection, luminal irregularity, contrast extravasation or aneurysm within the anterior nor posterior circulation. IMPRESSION: CT HEAD: No acute intracranial process. Chronic changes including moderate to severe chronic small vessel ischemic disease, and LEFT pontine infarct. Stable 19 mm anterior cranial fossa meningioma. CTA NECK: No hemodynamically significant stenosis  or acute vascular process. 18 mm nodule within or contiguous with RIGHT posterior thyroid lobe, recommend thyroid sonogram on a nonemergent basis. CTA HEAD: No acute large vessel occlusion. 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 Mild stenosis LEFT M2 segment. Electronically Signed By: Elon Alas M.D. On: 05/18/2015 02:48   Ct Head Wo Contrast  05/16/2015 CLINICAL DATA: 81 year old diabetic hypertensive female with generalize weakness. Initial encounter. EXAM: CT HEAD WITHOUT CONTRAST TECHNIQUE: Contiguous axial images were obtained from the base of the skull through the vertex without intravenous contrast. COMPARISON: 03/05/2015. FINDINGS: No intracranial hemorrhage. New from the prior examination is linear low density superior left paracentral pontine region. Question small acute infarct versus result of streak artifact. Remote basal ganglia infarcts bilaterally. Remote left corona radiata infarct. Prominent small vessel disease type changes. Global atrophy without hydrocephalus. Anterior frontal 1.9 cm calcified mass consistent with meningioma unchanged. No significant surrounding vasogenic edema. Mastoid air cells, middle ear cavities and visualized paranasal sinuses are clear. Post left lens replacement otherwise orbital structures unremarkable. IMPRESSION: No intracranial hemorrhage. New from the prior examination is linear low density superior left paracentral pontine region. Question small acute infarct versus result of streak artifact. Remote basal ganglia infarcts bilaterally. Remote left corona radiata infarct. Prominent small vessel disease type changes. Global atrophy without hydrocephalus. Anterior frontal 1.9 cm calcified mass consistent with meningioma unchanged. No significant surrounding vasogenic edema. Electronically Signed By: Genia Del M.D. On: 05/16/2015 16:28   Ct Angio Neck W/cm &/or Wo/cm  05/18/2015  CLINICAL DATA: Weakness for several days, old compromise walking at baseline. Gait imbalance. Possible LEFT pontine infarct. History of hypertension, hyperlipidemia. EXAM: CT ANGIOGRAPHY HEAD AND NECK TECHNIQUE: Multidetector CT imaging of the head and neck was performed using the standard protocol during bolus administration of intravenous contrast. Multiplanar CT image reconstructions and MIPs were obtained to evaluate the vascular anatomy. Carotid stenosis measurements (when applicable) are obtained utilizing NASCET criteria, using the distal internal carotid diameter as the denominator. CONTRAST: 50m OMNIPAQUE IOHEXOL 350 MG/ML SOLN 50 cc Omnipaque 350 COMPARISON: CT head May 16, 2015 FINDINGS: CT HEAD The ventricles and sulci are normal for age. No intraparenchymal hemorrhage, mass effect nor midline shift. Confluent supratentorial white matter hypodensities. Subcentimeter LEFT pontine white matter hypodensity persists. No acute large vascular territory infarcts. Old small LEFT basal ganglia lacunar infarcts. No abnormal intracranial enhancement. No abnormal extra-axial fluid collections. 19 mm anterior cranial fossa meningioma with local mass effect. Basal cisterns are patent. Severe calcific atherosclerosis of  the carotid siphons. No skull fracture. The included ocular globes and orbital contents are non-suspicious. Status post LEFT ocular lens implant. The mastoid aircells and included paranasal sinuses are well-aerated. Severe bilateral temporomandibular osteoarthrosis. CTA NECK Aortic arch: Normal appearance of the thoracic arch, normal branch pattern. Moderate calcific atherosclerosis. The origins of the innominate, left Common carotid artery and subclavian artery are widely patent. Right carotid system: Common carotid artery is widely patent, coursing in a straight line fashion. Normal appearance of the carotid bifurcation without hemodynamically significant stenosis by NASCET criteria. Mild  eccentric calcific atherosclerosis of the carotid bulb. Normal appearance of the included internal carotid artery. Left carotid system: Common carotid artery is widely patent, coursing in a straight line fashion. Normal appearance of the carotid bifurcation without hemodynamically significant stenosis by NASCET criteria. Mild eccentric calcific atherosclerosis of the carotid bulb. Normal appearance of the included internal carotid artery. Vertebral arteries:RIGHT vertebral artery is dominant. Normal appearance of the vertebral arteries, which appear widely patent. Skeleton: No acute osseous process though bone windows have not been submitted. Other neck: Soft tissues of the neck are nonacute though, not tailored for evaluation. 18 mm nodule isodense to thyroid contiguous with the RIGHT posterior lobe. CTA HEAD Anterior circulation: Normal appearance of the cervical internal carotid arteries, petrous, cavernous and supra clinoid internal carotid arteries. Widely patent anterior communicating artery. LEFT A1 segment is developmentally dominant. Patent anterior and middle cerebral arteries. Mild stenosis LEFT M2 segment. Posterior circulation: RIGHT vertebral artery is dominant with normal appearance of the vertebral arteries, vertebrobasilar junction and basilar artery, as well as main branch vessels. Moderate tandem stenosis bilateral posterior cerebral artery. No large vessel occlusion, hemodynamically significant stenosis, dissection, luminal irregularity, contrast extravasation or aneurysm within the anterior nor posterior circulation. IMPRESSION: CT HEAD: No acute intracranial process. Chronic changes including moderate to severe chronic small vessel ischemic disease, and LEFT pontine infarct. Stable 19 mm anterior cranial fossa meningioma. CTA NECK: No hemodynamically significant stenosis or acute vascular process. 18 mm nodule within or contiguous with RIGHT posterior thyroid lobe, recommend thyroid sonogram  on a nonemergent basis. CTA HEAD: No acute large vessel occlusion. 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 Mild stenosis LEFT M2 segment. Electronically Signed By: Elon Alas M.D. On: 05/18/2015 02:48   Dg Chest Portable 1 View  05/16/2015 CLINICAL DATA: 81 year old female with generalized weakness and decreased appetite EXAM: PORTABLE CHEST 1 VIEW COMPARISON: Prior chest x-ray 04/26/2015 FINDINGS: Stable borderline cardiomegaly with left heart enlargement. Atherosclerotic calcifications again noted in the transverse aorta. Unchanged position of left subclavian approach cardiac rhythm maintenance device with leads overlying the right atrium and right ventricle. Calcified granuloma in the left lower lobe remains unchanged. Dense calcification is also present surrounding the mitral valve annulus. The lungs are clear. No focal airspace consolidation, pleural effusion, pneumothorax or evidence of pulmonary edema. No suspicious pulmonary mass or nodule. Stable mild central bronchitic changes and interstitial prominence. No acute osseous abnormality. IMPRESSION: Stable chest x-ray without evidence of acute cardiopulmonary process. Electronically Signed By: Jacqulynn Cadet M.D. On: 05/16/2015 15:15     Medical Problem List and Plan: 1. 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 DVT Prophylaxis/Anticoagulation: SCDs. Monitor for any signs of DVT 3. Pain Management: Tylenol as needed 4. Mood/dementia: Aricept 10 mg daily. Discuss baseline cognition with family. Bed alarm for safety 5. Neuropsych: This patient is not capable of making decisions on her own behalf. 6. Skin/Wound Care: Routine  skin checks 7. Fluids/Electrolytes/Nutrition: Routine I&O with follow-up chemistry 8. Diabetes mellitus and peripheral neuropathy. Hemoglobin A1c 5.8. Sliding scale insulin. Check blood  sugars before meals and at bedtime. Patient on Glucophage XR 500 mg daily prior to admission. Resume as tolerated 9. Hyperlipidemia. 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 Patient is admitted to receive collaborative, interdisciplinary care between the physiatrist, rehab nursing staff, and therapy team. 2. Patient's level of medical complexity and substantial therapy needs in context of that medical necessity cannot be provided at a lesser intensity of care such as a SNF. 3. Patient has experienced substantial functional loss from his/her baseline which was documented above under the "Functional History" and "Functional Status" headings. Judging by the patient's diagnosis, physical exam, and functional history, the patient has potential for functional progress which will result in measurable gains while on inpatient rehab. These gains will be of substantial and practical use upon discharge in facilitating mobility and self-care at the household level. 4. Physiatrist will provide 24 hour management of medical needs as well as oversight of the therapy plan/treatment and provide guidance as appropriate regarding the interaction of the two. 5. 24 hour rehab nursing will assist with bladder management, bowel management, safety, skin/wound care, disease management, medication administration, pain management and patient education and help integrate therapy concepts, techniques,education, etc. 6. PT will assess and treat for/with: Lower extremity strength, range of motion, stamina, balance, functional mobility, safety, adaptive techniques and equipment, coping skills, pain control, stroke education. Goals are: Min A/Supervision. 7. OT will assess and treat for/with: ADL's, functional mobility, safety, upper extremity strength, adaptive techniques and equipment, ego support, and community reintegration.  Goals are: Min A/Supverision. Therapy may proceed with showering this patient. 8. SLP will assess and treat for/with: cognition, processing, and higher level cognitive functioning. Goals are: Min A/Supervision. 9. Case Management and Social Worker will assess and treat for psychological issues and discharge planning. 10. Team conference will be held weekly to assess progress toward goals and to determine barriers to discharge. 11. Patient will receive at least 3 hours of therapy per day at least 5 days per week. 12. ELOS: 14-17 days.  13. Prognosis: good  Delice Lesch, MD 05/18/2015      Revision History     Date/Time User Provider Type Action   05/18/2015 11:52 AM Ankit Lorie Phenix, MD Physician Addend   05/18/2015 11:50 AM Ankit Lorie Phenix, MD Physician Sign   05/18/2015 10:58 AM Cathlyn Parsons, PA-C Physician Assistant Pend   View Details Report       Routing History     Date/Time From To Method   05/18/2015 11:52 AM Ankit Lorie Phenix, MD Ankit Lorie Phenix, MD In [facility]   05/18/2015 11:52 AM Ankit Lorie Phenix, MD Mosie Lukes, MD In Basket

## 2015-05-18 NOTE — Discharge Summary (Signed)
Briana Thornton, is a 81 y.o. female  DOB [birth year]  MRN [id number].  Admission date:  05/16/2015  Admitting Physician  Edwin Dada, MD  Discharge Date:  05/18/2015   Primary MD  Penni Homans, MD  Recommendations for primary care physician for things to follow:  - Check CBC, BMP in 3 days.   CODE STATUS: DO NOT RESUSCITATE  Admission Diagnosis  Cerebral infarction due to unspecified mechanism [I63.9]   Discharge Diagnosis  Cerebral infarction due to unspecified mechanism [I63.9]    Principal Problem:   TIA (transient ischemic attack) Active Problems:   Essential hypertension   Hyperlipidemia   Pacemaker-[employer]   Dementia without behavioral disturbance   Controlled type 2 diabetes mellitus with diabetic nephropathy, without long-term current use of insulin (HCC)   Malnutrition of moderate degree      Past Medical History  Diagnosis Date  . Chicken pox as a child  . Measles as a child  . IBS (irritable bowel syndrome)   . Diabetes mellitus 47    type 2  . Hyperlipidemia 72  . Hypertension 72  . Vision loss of right eye   . Vertigo 2010    benign  . Calcification of cartilage     ear  . Cardiovascular system disease 07/02/2011  . Hemorrhoid 07/02/2011  . Osteoporosis 07/02/2011  . Weight loss 07/31/2011  . Anemia 05/22/2012  . Broken wrist 10-22-11    left  . Depression 05/22/2012  . Dermatitis 05/22/2012    Right neck    Past Surgical History  Procedure Laterality Date  . Hemorroidectomy  1979  . Knee scoped  2002    right  . Rotator cuff repair  2004    right  . Abdominal hysterectomy  2008    partial still has ovaries  . Lens implant left  2006  . Pacemaker insertion  2010  . Wrist surgery  11-05-11    left wrist  . Intramedullary (im) nail intertrochanteric Right 07/24/2013    Procedure: INTRAMEDULLARY (IM) NAIL INTERTROCHANTRIC;  Surgeon: Gearlean Alf, MD;   Location: [facility];  Service: Orthopedics;  Laterality: Right;       History of present illness and  Hospital Course:     Kindly see H&P for history of present illness and admission details, please review complete Labs, Consult reports and Test reports for all details in brief  HPI  from the history and physical done on the day of admission on 05/16/2015  HPI: Briana Thornton is a 81 y.o. female with a past medical history significant for NIDDM, HTN, CHB with pacemaker, and dementia who presents with one week progressive weakness, foot dragging, difficulty swallowing, and numbness.  The patient was discharged from a nursing home and has been living with her family for the last 3 weeks with in-home PT and OT. Over the last 5 days, her daughter has noticed that she has gotten weaker and weaker. She was previously able to ambulate with a walker and transfer by herself, but over the  last few days she requires assistance with all transfers, needs to use a wheelchair, and seems to be dragging her legs. The family are unclear if this is one leg or both legs (it seems that it is both legs), and the patient is unable to specify if her numbness is on one side or both sides. Because her leg weakness was so dramatically worse today, the patient daughter brought her to the ER.  In the ED, patient had a noncontrasted CT that suggested possible LEFT pontine infarct. An MRI could not be performed because the patient has a pacemaker. The patient was evaluated by neurology and [facility] was asked to admit for suspected stroke.   Hospital Course   TIA - Patient presents with subacute worsening of gait, without focal findings, initial CT head with suspicion for pontine infarct, was unable to obtain MRI brain secondary to pacemaker, neurology consulted, CT a head obtained, with no evidence of acute CVA. - CTA head and neck with no large vessel or worrisome stenosis - 2-D echo with EF 50-55%, with grade 1 diastolic  dysfunction, with no evidence of embolic source. -  will be started on aspirin, continue with statin,  Hypertension - Susceptible, continue with home medication  Hyperlipidemia - LDL 57, continue with statin on discharge  Diabetes mellitus 2 - Hemoglobin A1c is 5.5 in November, continue with metformin  Moderate protein calorie malnutrition - Continue with supplement  Dementia - Continue with Aricept  Anemia - stable  Meningioma - unchanged per CT  Discharge Condition:  Stable   Follow UP      Discharge Instructions  and  Discharge Medications        Medication List    ASK your doctor about these medications        acetaminophen 325 MG tablet  Commonly known as:  TYLENOL  Take 2 tablets (650 mg total) by mouth every 6 (six) hours as needed for mild pain (or Fever >/= 101).     alum & mag hydroxide-simeth 200-200-20 MG/5ML suspension  Commonly known as:  MAALOX/MYLANTA  Take 30 mLs by mouth every 6 (six) hours as needed for indigestion or heartburn (dyspepsia).     atorvastatin 10 MG tablet  Commonly known as:  LIPITOR  TAKE 1 TABLET (10 MG TOTAL) BY MOUTH DAILY.     bisacodyl 10 MG suppository  Commonly known as:  DULCOLAX  Place 1 suppository (10 mg total) rectally daily as needed for moderate constipation.     docusate sodium 100 MG capsule  Commonly known as:  COLACE  Take 100 mg by mouth every other day.     donepezil 10 MG tablet  Commonly known as:  ARICEPT  Take 1 tablet (10 mg total) by mouth at bedtime.     Ipratropium-Albuterol 20-100 MCG/ACT Aers respimat  Commonly known as:  COMBIVENT RESPIMAT  Inhale 1 puff into the lungs every 6 (six) hours as needed for wheezing or shortness of breath.     metFORMIN 500 MG 24 hr tablet  Commonly known as:  GLUCOPHAGE-XR  TAKE 1 TABLET (500 MG TOTAL) BY MOUTH DAILY WITH BREAKFAST.     methylcellulose 1 % ophthalmic solution  Commonly known as:  ARTIFICIAL TEARS  Place 1 drop into both eyes as  needed (dry eyes).     multivitamin with minerals Tabs tablet  Take 2 tablets by mouth daily.     polyethylene glycol packet  Commonly known as:  MIRALAX / GLYCOLAX  Take 17 g by  mouth daily as needed for mild constipation.     PROBIOTIC DAILY PO  Take 1 tablet by mouth daily.          Diet and Activity recommendation: See Discharge Instructions above   Consults obtained -  Neurology Inpatient rehabilitation   Major procedures and Radiology Reports - PLEASE review detailed and final reports for all details, in brief -      Ct Angio Head W/cm &/or Wo Cm  05/18/2015  CLINICAL DATA:  Weakness for several days, old compromise walking at baseline. Gait imbalance. Possible LEFT pontine infarct. History of hypertension, hyperlipidemia. EXAM: CT ANGIOGRAPHY HEAD AND NECK TECHNIQUE: Multidetector CT imaging of the head and neck was performed using the standard protocol during bolus administration of intravenous contrast. Multiplanar CT image reconstructions and MIPs were obtained to evaluate the vascular anatomy. Carotid stenosis measurements (when applicable) are obtained utilizing NASCET criteria, using the distal internal carotid diameter as the denominator. CONTRAST:  26mL OMNIPAQUE IOHEXOL 350 MG/ML SOLN 50 cc Omnipaque 350 COMPARISON:  CT head May 16, 2015 FINDINGS: CT HEAD The ventricles and sulci are normal for age. No intraparenchymal hemorrhage, mass effect nor midline shift. Confluent supratentorial white matter hypodensities. Subcentimeter LEFT pontine white matter hypodensity persists. No acute large vascular territory infarcts. Old small LEFT basal ganglia lacunar infarcts. No abnormal intracranial enhancement. No abnormal extra-axial fluid collections. 19 mm anterior cranial fossa meningioma with local mass effect. Basal cisterns are patent. Severe calcific atherosclerosis of the carotid siphons. No skull fracture. The included ocular globes and orbital contents are  non-suspicious. Status post LEFT ocular lens implant. The mastoid aircells and included paranasal sinuses are well-aerated. Severe bilateral temporomandibular osteoarthrosis. CTA NECK Aortic arch: Normal appearance of the thoracic arch, normal branch pattern. Moderate calcific atherosclerosis. The origins of the innominate, left Common carotid artery and subclavian artery are widely patent. Right carotid system: Common carotid artery is widely patent, coursing in a straight line fashion. Normal appearance of the carotid bifurcation without hemodynamically significant stenosis by NASCET criteria. Mild eccentric calcific atherosclerosis of the carotid bulb. Normal appearance of the included internal carotid artery. Left carotid system: Common carotid artery is widely patent, coursing in a straight line fashion. Normal appearance of the carotid bifurcation without hemodynamically significant stenosis by NASCET criteria. Mild eccentric calcific atherosclerosis of the carotid bulb. Normal appearance of the included internal carotid artery. Vertebral arteries:RIGHT vertebral artery is dominant. Normal appearance of the vertebral arteries, which appear widely patent. Skeleton: No acute osseous process though bone windows have not been submitted. Other neck: Soft tissues of the neck are nonacute though, not tailored for evaluation. 18 mm nodule isodense to thyroid contiguous with the RIGHT posterior lobe. CTA HEAD Anterior circulation: Normal appearance of the cervical internal carotid arteries, petrous, cavernous and supra clinoid internal carotid arteries. Widely patent anterior communicating artery. LEFT A1 segment is developmentally dominant. Patent anterior and middle cerebral arteries. Mild stenosis LEFT M2 segment. Posterior circulation: RIGHT vertebral artery is dominant with normal appearance of the vertebral arteries, vertebrobasilar junction and basilar artery, as well as main branch vessels. Moderate tandem  stenosis bilateral posterior cerebral artery. No large vessel occlusion, hemodynamically significant stenosis, dissection, luminal irregularity, contrast extravasation or aneurysm within the anterior nor posterior circulation. IMPRESSION: CT HEAD:  No acute intracranial process. Chronic changes including moderate to severe chronic small vessel ischemic disease, and LEFT pontine infarct. Stable 19 mm anterior cranial fossa meningioma. CTA NECK: No hemodynamically significant stenosis or acute vascular process. 18 mm nodule within  or contiguous with RIGHT posterior thyroid lobe, recommend thyroid sonogram on a nonemergent basis. CTA HEAD: No acute large vessel occlusion. 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 Mild stenosis LEFT M2 segment. Electronically Signed   By: Elon Alas M.D.   On: 05/18/2015 02:48   Dg Chest 2 View  04/26/2015  CLINICAL DATA:  Chronic cough.  Hypertension. EXAM: CHEST  2 VIEW COMPARISON:  March 06, 2015 FINDINGS: There is a calcified granuloma in the left lower lobe. There is no edema or consolidation. Heart size and pulmonary vascular normal. There is extensive mitral annulus calcification. Pacemaker leads are attached to the right atrium and middle cardiac vein regions. No adenopathy. There is atherosclerotic calcification throughout the aorta. There is stable collapse of a lower thoracic vertebral body. Bones are osteoporotic. IMPRESSION: No edema or consolidation. Calcified granuloma left lower lobe. No change in cardiac silhouette. Marked collapse of a lower thoracic vertebral body present. Bones osteoporotic. Electronically Signed   By: Lowella Grip III M.D.   On: 04/26/2015 15:08   Ct Head Wo Contrast  05/16/2015  CLINICAL DATA:  81 year old diabetic hypertensive female with generalize weakness. Initial encounter. EXAM: CT HEAD WITHOUT CONTRAST TECHNIQUE: Contiguous axial images were obtained from the base of  the skull through the vertex without intravenous contrast. COMPARISON:  03/05/2015. FINDINGS: No intracranial hemorrhage. New from the prior examination is linear low density superior left paracentral pontine region. Question small acute infarct versus result of streak artifact. Remote basal ganglia infarcts bilaterally. Remote left corona radiata infarct. Prominent small vessel disease type changes. Global atrophy without hydrocephalus. Anterior frontal 1.9 cm calcified mass consistent with meningioma unchanged. No significant surrounding vasogenic edema. Mastoid air cells, middle ear cavities and visualized paranasal sinuses are clear. Post left lens replacement otherwise orbital structures unremarkable. IMPRESSION: No intracranial hemorrhage. New from the prior examination is linear low density superior left paracentral pontine region. Question small acute infarct versus result of streak artifact. Remote basal ganglia infarcts bilaterally. Remote left corona radiata infarct. Prominent small vessel disease type changes. Global atrophy without hydrocephalus. Anterior frontal 1.9 cm calcified mass consistent with meningioma unchanged. No significant surrounding vasogenic edema. Electronically Signed   By: Genia Del M.D.   On: 05/16/2015 16:28   Ct Angio Neck W/cm &/or Wo/cm  05/18/2015  CLINICAL DATA:  Weakness for several days, old compromise walking at baseline. Gait imbalance. Possible LEFT pontine infarct. History of hypertension, hyperlipidemia. EXAM: CT ANGIOGRAPHY HEAD AND NECK TECHNIQUE: Multidetector CT imaging of the head and neck was performed using the standard protocol during bolus administration of intravenous contrast. Multiplanar CT image reconstructions and MIPs were obtained to evaluate the vascular anatomy. Carotid stenosis measurements (when applicable) are obtained utilizing NASCET criteria, using the distal internal carotid diameter as the denominator. CONTRAST:  16mL OMNIPAQUE IOHEXOL  350 MG/ML SOLN 50 cc Omnipaque 350 COMPARISON:  CT head May 16, 2015 FINDINGS: CT HEAD The ventricles and sulci are normal for age. No intraparenchymal hemorrhage, mass effect nor midline shift. Confluent supratentorial white matter hypodensities. Subcentimeter LEFT pontine white matter hypodensity persists. No acute large vascular territory infarcts. Old small LEFT basal ganglia lacunar infarcts. No abnormal intracranial enhancement. No abnormal extra-axial fluid collections. 19 mm anterior cranial fossa meningioma with local mass effect. Basal cisterns are patent. Severe calcific atherosclerosis of the carotid siphons. No skull fracture. The included ocular globes and orbital contents are non-suspicious. Status post LEFT ocular lens implant. The mastoid aircells and included paranasal sinuses are well-aerated.  Severe bilateral temporomandibular osteoarthrosis. CTA NECK Aortic arch: Normal appearance of the thoracic arch, normal branch pattern. Moderate calcific atherosclerosis. The origins of the innominate, left Common carotid artery and subclavian artery are widely patent. Right carotid system: Common carotid artery is widely patent, coursing in a straight line fashion. Normal appearance of the carotid bifurcation without hemodynamically significant stenosis by NASCET criteria. Mild eccentric calcific atherosclerosis of the carotid bulb. Normal appearance of the included internal carotid artery. Left carotid system: Common carotid artery is widely patent, coursing in a straight line fashion. Normal appearance of the carotid bifurcation without hemodynamically significant stenosis by NASCET criteria. Mild eccentric calcific atherosclerosis of the carotid bulb. Normal appearance of the included internal carotid artery. Vertebral arteries:RIGHT vertebral artery is dominant. Normal appearance of the vertebral arteries, which appear widely patent. Skeleton: No acute osseous process though bone windows have not  been submitted. Other neck: Soft tissues of the neck are nonacute though, not tailored for evaluation. 18 mm nodule isodense to thyroid contiguous with the RIGHT posterior lobe. CTA HEAD Anterior circulation: Normal appearance of the cervical internal carotid arteries, petrous, cavernous and supra clinoid internal carotid arteries. Widely patent anterior communicating artery. LEFT A1 segment is developmentally dominant. Patent anterior and middle cerebral arteries. Mild stenosis LEFT M2 segment. Posterior circulation: RIGHT vertebral artery is dominant with normal appearance of the vertebral arteries, vertebrobasilar junction and basilar artery, as well as main branch vessels. Moderate tandem stenosis bilateral posterior cerebral artery. No large vessel occlusion, hemodynamically significant stenosis, dissection, luminal irregularity, contrast extravasation or aneurysm within the anterior nor posterior circulation. IMPRESSION: CT HEAD:  No acute intracranial process. Chronic changes including moderate to severe chronic small vessel ischemic disease, and LEFT pontine infarct. Stable 19 mm anterior cranial fossa meningioma. CTA NECK: No hemodynamically significant stenosis or acute vascular process. 18 mm nodule within or contiguous with RIGHT posterior thyroid lobe, recommend thyroid sonogram on a nonemergent basis. CTA HEAD: No acute large vessel occlusion. 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 Mild stenosis LEFT M2 segment. Electronically Signed   By: Elon Alas M.D.   On: 05/18/2015 02:48   Dg Chest Portable 1 View  05/16/2015  CLINICAL DATA:  81 year old female with generalized weakness and decreased appetite EXAM: PORTABLE CHEST 1 VIEW COMPARISON:  Prior chest x-ray 04/26/2015 FINDINGS: Stable borderline cardiomegaly with left heart enlargement. Atherosclerotic calcifications again noted in the transverse aorta. Unchanged position of left  subclavian approach cardiac rhythm maintenance device with leads overlying the right atrium and right ventricle. Calcified granuloma in the left lower lobe remains unchanged. Dense calcification is also present surrounding the mitral valve annulus. The lungs are clear. No focal airspace consolidation, pleural effusion, pneumothorax or evidence of pulmonary edema. No suspicious pulmonary mass or nodule. Stable mild central bronchitic changes and interstitial prominence. No acute osseous abnormality. IMPRESSION: Stable chest x-ray without evidence of acute cardiopulmonary process. Electronically Signed   By: Jacqulynn Cadet M.D.   On: 05/16/2015 15:15    Micro Results     No results found for this or any previous visit (from the past 240 hour(s)).     Today   Subjective:   [name] today has no headache,no chest abdominal pain,no new weakness tingling or numbness,  Objective:   Blood pressure 138/60, pulse 62, temperature 98.4 F (36.9 C), temperature source Oral, resp. rate 20, height 5\' 2"  (1.575 m), weight 52.3 kg (115 lb 4.8 oz), SpO2 98 %.  No intake or output data in  the 24 hours ending 05/18/15 1521  Exam General exam: Pleasantly elderly female laying in bed in no apparent distress. Respiratory system: Clear. No increased work of breathing. Cardiovascular system: S1 & S2 heard, RRR. No JVD, murmurs, gallops, clicks or pedal edema. Tele: AV paced rythm Gastrointestinal system: Abdomen is nondistended, soft and nontender. Normal bowel sounds heard. Central nervous system: Alert and oriented. No focal neurological deficits. Extremities: Symmetric 5 x 5 power.  Data Review   CBC w Diff:  Lab Results  Component Value Date   WBC 8.5 05/16/2015   HGB 11.9* 05/16/2015   HCT 37.7 05/16/2015   PLT 219 05/16/2015   LYMPHOPCT 12 03/05/2015   MONOPCT 4 03/05/2015   EOSPCT 1 03/05/2015   BASOPCT 0 03/05/2015    CMP:  Lab Results  Component Value Date   NA 144  05/16/2015   K 3.8 05/16/2015   CL 110 05/16/2015   CO2 26 05/16/2015   BUN 19 05/16/2015   CREATININE 0.90 05/16/2015   CREATININE 1.07 01/06/2014   PROT 5.9* 05/16/2015   ALBUMIN 3.2* 05/16/2015   BILITOT 0.9 05/16/2015   ALKPHOS 71 05/16/2015   AST 17 05/16/2015   ALT 10* 05/16/2015  .   Total Time in preparing paper work, data evaluation and todays exam - 35 minutes  Mart Colpitts M.D on 05/18/2015 at 3:21 PM  [facility]   Office  [phone]

## 2015-05-18 NOTE — H&P (View-Only) (Signed)
Physical Medicine and Rehabilitation Admission H&P    Chief Complaint  Patient presents with  . numbness in legs   : HPI: Briana Thornton is a 81 y.o. right handed female with history of hypertension, diabetes mellitus with peripheral neuropathy,CHB with pacemaker, dementia maintained on Aricept. Patient lives with daughter who is a Marine scientist at [employer] and son-in-law. Patient used a walker prior to admission. Family assistance is needed. Recently discharged from [place] 3 weeks ago for pneumonia. Presented 05/16/2015 with right sided weakness 1 week. Cranial CT scan shows early left pontine infarct as well as remote basal ganglia infarcts bilaterally, remote left corona radiata infarct.Marland Kitchen MRI not completed due to pacemaker. Patient did not receive TPA. Echocardiogram CT angiogram of head and neck with no large vessel occlusion or stenosis. Presently maintained on aspirin for CVA prophylaxis. Tolerating a regular consistency diet. Physical and occupational therapy evaluations completed 05/17/2015 with recommendations of physical medicine rehabilitation consult. Patient was admitted for a comprehensive rehabilitation program  ROS Constitutional: Negative for fever and chills.  HENT: Negative for hearing loss.  Eyes:   Decreased vision right eye  Respiratory: Negative for cough and shortness of breath.  Cardiovascular: Negative for chest pain and leg swelling.  Gastrointestinal: Positive for constipation.  Genitourinary: Negative for dysuria and flank pain.  Musculoskeletal: Positive for myalgias and joint pain.  Skin: Negative for rash.  Neurological: Positive for weakness. Negative for headaches.  Psychiatric/Behavioral: Positive for depression and memory loss.  All other systems reviewed and are negative   Past Medical History  Diagnosis Date  . Chicken pox as a child  . Measles as a child  . IBS (irritable bowel syndrome)   . Diabetes  mellitus 47    type 2  . Hyperlipidemia 72  . Hypertension 72  . Vision loss of right eye   . Vertigo 2010    benign  . Calcification of cartilage     ear  . Cardiovascular system disease 07/02/2011  . Hemorrhoid 07/02/2011  . Osteoporosis 07/02/2011  . Weight loss 07/31/2011  . Anemia 05/22/2012  . Broken wrist 10-22-11    left  . Depression 05/22/2012  . Dermatitis 05/22/2012    Right neck   Past Surgical History  Procedure Laterality Date  . Hemorroidectomy  1979  . Knee scoped  2002    right  . Rotator cuff repair  2004    right  . Abdominal hysterectomy  2008    partial still has ovaries  . Lens implant left  2006  . Pacemaker insertion  2010  . Wrist surgery  11-05-11    left wrist  . Intramedullary (im) nail intertrochanteric Right 07/24/2013    Procedure: INTRAMEDULLARY (IM) NAIL INTERTROCHANTRIC;  Surgeon: Gearlean Alf, MD;  Location: [facility];  Service: Orthopedics;  Laterality: Right;   Family History  Problem Relation Age of Onset  . Diabetes Mother     type 2  . Hypertension Mother   . Cancer Brother     lung-smoker  . Diabetes Daughter     pre diabetic  . Diabetes Son     type 2  . Cancer Maternal Grandfather     prostate   Social History:  reports that she has never smoked. She has never used smokeless tobacco. She reports that she does not drink alcohol or use illicit drugs. Allergies: No Known Allergies Medications Prior to Admission  Medication Sig Dispense Refill  . acetaminophen (TYLENOL) 325 MG  tablet Take 2 tablets (650 mg total) by mouth every 6 (six) hours as needed for mild pain (or Fever >/= 101). 60 tablet 0  . alum & mag hydroxide-simeth (MAALOX/MYLANTA) 200-200-20 MG/5ML suspension Take 30 mLs by mouth every 6 (six) hours as needed for indigestion or heartburn (dyspepsia). 355 mL 0  . atorvastatin (LIPITOR) 10 MG tablet TAKE 1 TABLET (10 MG TOTAL) BY MOUTH DAILY. 90 tablet 3  . bisacodyl (DULCOLAX) 10 MG suppository Place 1 suppository (10  mg total) rectally daily as needed for moderate constipation. 12 suppository 0  . docusate sodium (COLACE) 100 MG capsule Take 100 mg by mouth every other day.     . donepezil (ARICEPT) 10 MG tablet Take 1 tablet (10 mg total) by mouth at bedtime. (Patient taking differently: Take 10 mg by mouth daily. ) 90 tablet 3  . metFORMIN (GLUCOPHAGE-XR) 500 MG 24 hr tablet TAKE 1 TABLET (500 MG TOTAL) BY MOUTH DAILY WITH BREAKFAST. 90 tablet 3  . methylcellulose (ARTIFICIAL TEARS) 1 % ophthalmic solution Place 1 drop into both eyes as needed (dry eyes).     . Multiple Vitamin (MULTIVITAMIN WITH MINERALS) TABS tablet Take 2 tablets by mouth daily.     . polyethylene glycol (MIRALAX / GLYCOLAX) packet Take 17 g by mouth daily as needed for mild constipation. 14 each 0  . Probiotic Product (PROBIOTIC DAILY PO) Take 1 tablet by mouth daily.    . Ipratropium-Albuterol (COMBIVENT RESPIMAT) 20-100 MCG/ACT AERS respimat Inhale 1 puff into the lungs every 6 (six) hours as needed for wheezing or shortness of breath. (Patient not taking: Reported on 04/26/2015)      Home: Home Living Family/patient expects to be discharged to:: Unsure Living Arrangements: Children Available Help at Discharge: Family Type of Home: House Home Access: Ramped entrance Home Layout: One level Bathroom Shower/Tub: Walk-in shower Home Equipment: Environmental consultant - 2 wheels, Wheelchair - manual, Information systems manager - built in Additional Comments: resides at [employer] home  Lives With: Family   Functional History: Prior Function Level of Independence: Needs assistance Gait / Transfers Assistance Needed: left SNF  weeks ago walking modified independent with RW; over past week progressivley weaker to point of non-ambulatory and using w/c ADL's / Homemaking Assistance Needed: assist with bathing and dressing  Functional Status:  Mobility: Bed Mobility General bed mobility comments: not assessed Transfers Overall transfer level: Needs  assistance Equipment used: Rolling walker (2 wheeled) Transfers: Sit to/from Stand Sit to Stand: Max assist General transfer comment: heavy assist to stand and assist given to scoot further to edge of chair. Ambulation/Gait Ambulation/Gait assistance: Mod assist Ambulation Distance (Feet): 10 Feet (toileted, 5) Assistive device: Rolling walker (2 wheeled) Gait Pattern/deviations: Step-to pattern, Decreased stride length, Decreased dorsiflexion - right, Decreased dorsiflexion - left, Leaning posteriorly, Shuffle, Trunk flexed, Narrow base of support General Gait Details: loses balance posteriorly; requires assist to maneuver RW in all turns; increased difficulty advancing LLE (especially when turning or stepping backwards) Gait velocity interpretation: <1.8 ft/sec, indicative of risk for recurrent falls    ADL: ADL Overall ADL's : Needs assistance/impaired Upper Body Dressing : Moderate assistance, Sitting Lower Body Dressing: Maximal assistance, Sit to/from stand Toilet Transfer: Maximal assistance, RW (sit to stand from chair) General ADL Comments: Discussed d/c recommendation.  Cognition: Cognition Overall Cognitive Status: No family/caregiver present to determine baseline cognitive functioning Arousal/Alertness: Awake/alert Orientation Level: Oriented to person, Oriented to place, Disoriented to time, Disoriented to situation Attention: Focused Focused Attention: Appears intact Memory: Impaired Memory Impairment:  Storage deficit, Retrieval deficit Awareness: Impaired Awareness Impairment: Intellectual impairment Problem Solving: Impaired Problem Solving Impairment: Verbal basic Safety/Judgment: Impaired Cognition Arousal/Alertness: Awake/alert Behavior During Therapy: WFL for tasks assessed/performed Overall Cognitive Status: No family/caregiver present to determine baseline cognitive functioning  Physical Exam: Blood pressure 173/57, pulse 60, temperature 97.7 F (36.5  C), temperature source Axillary, resp. rate 18, height '5\' 2"'$  (1.575 m), weight 52.3 kg (115 lb 4.8 oz), SpO2 99 %. Physical Exam Constitutional: NAD. Frail appearing. Vital signs reviewed.  HENT: Normocephalic, atraumatic Eyes: Conj and EOM normal.   Neck: Normal range of motion. Neck supple. No thyromegaly present.  Cardiovascular: Normal rate and no murmurs.  Respiratory: Effort normal and breath sounds normal. No respiratory distress.  GI: Soft. Bowel sounds are normal. She exhibits no distension.  Neurological: She is alert and oriented x2 with increased time and cues.  Makes good eye contact with examiner.  Follows commands. Limited medical historian.  Motor: RUE 3+/5 proximal to distal.  RLE 2+/5 hip flexion, 3/5 ankle dorsi/plantar flexion.  LUE: 4-/5 proximal to distal LLE 2+ hip flexion, 3+ ankle dorsi/plantar flexion Inconsistent memory and attention  Skin: Skin is warm and dry.  Psychiatric:  Pleasant, cooperative, delayed, confused   Results for orders placed or performed during the hospital encounter of 05/16/15 (from the past 48 hour(s))  CBC     Status: None   Collection Time: 05/16/15  2:48 PM  Result Value Ref Range   WBC 8.0 4.0 - 10.5 K/uL   RBC 4.26 3.87 - 5.11 MIL/uL   Hemoglobin 12.1 12.0 - 15.0 g/dL   HCT 39.6 36.0 - 46.0 %   MCV 93.0 78.0 - 100.0 fL   MCH 28.4 26.0 - 34.0 pg   MCHC 30.6 30.0 - 36.0 g/dL   RDW 14.4 11.5 - 15.5 %   Platelets 248 150 - 400 K/uL  Comprehensive metabolic panel     Status: Abnormal   Collection Time: 05/16/15  2:48 PM  Result Value Ref Range   Sodium 144 135 - 145 mmol/L   Potassium 4.0 3.5 - 5.1 mmol/L   Chloride 107 101 - 111 mmol/L   CO2 30 22 - 32 mmol/L   Glucose, Bld 96 65 - 99 mg/dL   BUN 23 (H) 6 - 20 mg/dL   Creatinine, Ser 0.91 0.44 - 1.00 mg/dL   Calcium 9.2 8.9 - 10.3 mg/dL   Total Protein 6.6 6.5 - 8.1 g/dL   Albumin 3.7 3.5 - 5.0 g/dL   AST 18 15 - 41 U/L   ALT 10 (L) 14 - 54 U/L   Alkaline  Phosphatase 74 38 - 126 U/L   Total Bilirubin 0.8 0.3 - 1.2 mg/dL   GFR calc non Af Amer 56 (L) >60 mL/min   GFR calc Af Amer >60 >60 mL/min    Comment: (NOTE) The eGFR has been calculated using the CKD EPI equation. This calculation has not been validated in all clinical situations. eGFR's persistently <60 mL/min signify possible Chronic Kidney Disease.    Anion gap 7 5 - 15  I-stat troponin, ED     Status: None   Collection Time: 05/16/15  3:28 PM  Result Value Ref Range   Troponin i, poc 0.01 0.00 - 0.08 ng/mL   Comment 3            Comment: Due to the release kinetics of cTnI, a negative result within the first hours of the onset of symptoms does not rule out myocardial  infarction with certainty. If myocardial infarction is still suspected, repeat the test at appropriate intervals.   Urinalysis, Routine w reflex microscopic (not at [facility])     Status: None   Collection Time: 05/16/15  4:06 PM  Result Value Ref Range   Color, Urine YELLOW YELLOW   APPearance CLEAR CLEAR   Specific Gravity, Urine 1.014 1.005 - 1.030   pH 7.5 5.0 - 8.0   Glucose, UA NEGATIVE NEGATIVE mg/dL   Hgb urine dipstick NEGATIVE NEGATIVE   Bilirubin Urine NEGATIVE NEGATIVE   Ketones, ur NEGATIVE NEGATIVE mg/dL   Protein, ur NEGATIVE NEGATIVE mg/dL   Nitrite NEGATIVE NEGATIVE   Leukocytes, UA NEGATIVE NEGATIVE    Comment: MICROSCOPIC NOT DONE ON URINES WITH NEGATIVE PROTEIN, BLOOD, LEUKOCYTES, NITRITE, OR GLUCOSE <1000 mg/dL.  Glucose, capillary     Status: None   Collection Time: 05/16/15  9:51 PM  Result Value Ref Range   Glucose-Capillary 90 65 - 99 mg/dL   Comment 1 Notify RN    Comment 2 Document in Chart   Comprehensive metabolic panel     Status: Abnormal   Collection Time: 05/16/15 10:10 PM  Result Value Ref Range   Sodium 144 135 - 145 mmol/L   Potassium 3.8 3.5 - 5.1 mmol/L   Chloride 110 101 - 111 mmol/L   CO2 26 22 - 32 mmol/L   Glucose, Bld 116 (H) 65 - 99 mg/dL   BUN 19 6 - 20  mg/dL   Creatinine, Ser 7.88 0.44 - 1.00 mg/dL   Calcium 9.1 8.9 - 50.9 mg/dL   Total Protein 5.9 (L) 6.5 - 8.1 g/dL   Albumin 3.2 (L) 3.5 - 5.0 g/dL   AST 17 15 - 41 U/L   ALT 10 (L) 14 - 54 U/L   Alkaline Phosphatase 71 38 - 126 U/L   Total Bilirubin 0.9 0.3 - 1.2 mg/dL   GFR calc non Af Amer 56 (L) >60 mL/min   GFR calc Af Amer >60 >60 mL/min    Comment: (NOTE) The eGFR has been calculated using the CKD EPI equation. This calculation has not been validated in all clinical situations. eGFR's persistently <60 mL/min signify possible Chronic Kidney Disease.    Anion gap 8 5 - 15  CBC     Status: Abnormal   Collection Time: 05/16/15 10:10 PM  Result Value Ref Range   WBC 8.5 4.0 - 10.5 K/uL   RBC 4.06 3.87 - 5.11 MIL/uL   Hemoglobin 11.9 (L) 12.0 - 15.0 g/dL   HCT 36.6 48.2 - 24.0 %   MCV 92.9 78.0 - 100.0 fL   MCH 29.3 26.0 - 34.0 pg   MCHC 31.6 30.0 - 36.0 g/dL   RDW 11.0 46.5 - 32.8 %   Platelets 219 150 - 400 K/uL  Glucose, capillary     Status: None   Collection Time: 05/17/15  6:17 AM  Result Value Ref Range   Glucose-Capillary 74 65 - 99 mg/dL  Hemoglobin H2Z     Status: Abnormal   Collection Time: 05/17/15  8:22 AM  Result Value Ref Range   Hgb A1c MFr Bld 5.8 (H) 4.8 - 5.6 %    Comment: (NOTE)         Pre-diabetes: 5.7 - 6.4         Diabetes: >6.4         Glycemic control for adults with diabetes: <7.0    Mean Plasma Glucose 120 mg/dL    Comment: (NOTE) Performed  At: [facility] [place], [place] [id number] Lindon Romp MD HU:[id number]   Lipid panel     Status: None   Collection Time: 05/17/15  8:22 AM  Result Value Ref Range   Cholesterol 128 0 - 200 mg/dL   Triglycerides 61 <150 mg/dL   HDL 65 >40 mg/dL   Total CHOL/HDL Ratio 2.0 RATIO   VLDL 12 0 - 40 mg/dL   LDL Cholesterol 51 0 - 99 mg/dL    Comment:        Total Cholesterol/HDL:CHD Risk Coronary Heart Disease Risk Table                     Men   Women  1/2 Average  Risk   3.4   3.3  Average Risk       5.0   4.4  2 X Average Risk   9.6   7.1  3 X Average Risk  23.4   11.0        Use the calculated Patient Ratio above and the CHD Risk Table to determine the patient's CHD Risk.        ATP III CLASSIFICATION (LDL):  <100     mg/dL   Optimal  100-129  mg/dL   Near or Above                    Optimal  130-159  mg/dL   Borderline  160-189  mg/dL   High  >190     mg/dL   Very High   Glucose, capillary     Status: Abnormal   Collection Time: 05/17/15 11:13 AM  Result Value Ref Range   Glucose-Capillary 111 (H) 65 - 99 mg/dL   Comment 1 Notify RN    Comment 2 Document in Chart   Glucose, capillary     Status: Abnormal   Collection Time: 05/17/15  4:06 PM  Result Value Ref Range   Glucose-Capillary 127 (H) 65 - 99 mg/dL  Glucose, capillary     Status: None   Collection Time: 05/17/15 10:15 PM  Result Value Ref Range   Glucose-Capillary 90 65 - 99 mg/dL   Comment 1 Notify RN    Comment 2 Document in Chart    Ct Angio Head W/cm &/or Wo Cm  05/18/2015  CLINICAL DATA:  Weakness for several days, old compromise walking at baseline. Gait imbalance. Possible LEFT pontine infarct. History of hypertension, hyperlipidemia. EXAM: CT ANGIOGRAPHY HEAD AND NECK TECHNIQUE: Multidetector CT imaging of the head and neck was performed using the standard protocol during bolus administration of intravenous contrast. Multiplanar CT image reconstructions and MIPs were obtained to evaluate the vascular anatomy. Carotid stenosis measurements (when applicable) are obtained utilizing NASCET criteria, using the distal internal carotid diameter as the denominator. CONTRAST:  60m OMNIPAQUE IOHEXOL 350 MG/ML SOLN 50 cc Omnipaque 350 COMPARISON:  CT head May 16, 2015 FINDINGS: CT HEAD The ventricles and sulci are normal for age. No intraparenchymal hemorrhage, mass effect nor midline shift. Confluent supratentorial white matter hypodensities. Subcentimeter LEFT pontine white  matter hypodensity persists. No acute large vascular territory infarcts. Old small LEFT basal ganglia lacunar infarcts. No abnormal intracranial enhancement. No abnormal extra-axial fluid collections. 19 mm anterior cranial fossa meningioma with local mass effect. Basal cisterns are patent. Severe calcific atherosclerosis of the carotid siphons. No skull fracture. The included ocular globes and orbital contents are non-suspicious. Status post LEFT ocular lens implant. The mastoid aircells and included  paranasal sinuses are well-aerated. Severe bilateral temporomandibular osteoarthrosis. CTA NECK Aortic arch: Normal appearance of the thoracic arch, normal branch pattern. Moderate calcific atherosclerosis. The origins of the innominate, left Common carotid artery and subclavian artery are widely patent. Right carotid system: Common carotid artery is widely patent, coursing in a straight line fashion. Normal appearance of the carotid bifurcation without hemodynamically significant stenosis by NASCET criteria. Mild eccentric calcific atherosclerosis of the carotid bulb. Normal appearance of the included internal carotid artery. Left carotid system: Common carotid artery is widely patent, coursing in a straight line fashion. Normal appearance of the carotid bifurcation without hemodynamically significant stenosis by NASCET criteria. Mild eccentric calcific atherosclerosis of the carotid bulb. Normal appearance of the included internal carotid artery. Vertebral arteries:RIGHT vertebral artery is dominant. Normal appearance of the vertebral arteries, which appear widely patent. Skeleton: No acute osseous process though bone windows have not been submitted. Other neck: Soft tissues of the neck are nonacute though, not tailored for evaluation. 18 mm nodule isodense to thyroid contiguous with the RIGHT posterior lobe. CTA HEAD Anterior circulation: Normal appearance of the cervical internal carotid arteries, petrous,  cavernous and supra clinoid internal carotid arteries. Widely patent anterior communicating artery. LEFT A1 segment is developmentally dominant. Patent anterior and middle cerebral arteries. Mild stenosis LEFT M2 segment. Posterior circulation: RIGHT vertebral artery is dominant with normal appearance of the vertebral arteries, vertebrobasilar junction and basilar artery, as well as main branch vessels. Moderate tandem stenosis bilateral posterior cerebral artery. No large vessel occlusion, hemodynamically significant stenosis, dissection, luminal irregularity, contrast extravasation or aneurysm within the anterior nor posterior circulation. IMPRESSION: CT HEAD:  No acute intracranial process. Chronic changes including moderate to severe chronic small vessel ischemic disease, and LEFT pontine infarct. Stable 19 mm anterior cranial fossa meningioma. CTA NECK: No hemodynamically significant stenosis or acute vascular process. 18 mm nodule within or contiguous with RIGHT posterior thyroid lobe, recommend thyroid sonogram on a nonemergent basis. CTA HEAD: No acute large vessel occlusion. 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 Mild stenosis LEFT M2 segment. Electronically Signed   By: Elon Alas M.D.   On: 05/18/2015 02:48   Ct Head Wo Contrast  05/16/2015  CLINICAL DATA:  81 year old diabetic hypertensive female with generalize weakness. Initial encounter. EXAM: CT HEAD WITHOUT CONTRAST TECHNIQUE: Contiguous axial images were obtained from the base of the skull through the vertex without intravenous contrast. COMPARISON:  03/05/2015. FINDINGS: No intracranial hemorrhage. New from the prior examination is linear low density superior left paracentral pontine region. Question small acute infarct versus result of streak artifact. Remote basal ganglia infarcts bilaterally. Remote left corona radiata infarct. Prominent small vessel disease type changes. Global  atrophy without hydrocephalus. Anterior frontal 1.9 cm calcified mass consistent with meningioma unchanged. No significant surrounding vasogenic edema. Mastoid air cells, middle ear cavities and visualized paranasal sinuses are clear. Post left lens replacement otherwise orbital structures unremarkable. IMPRESSION: No intracranial hemorrhage. New from the prior examination is linear low density superior left paracentral pontine region. Question small acute infarct versus result of streak artifact. Remote basal ganglia infarcts bilaterally. Remote left corona radiata infarct. Prominent small vessel disease type changes. Global atrophy without hydrocephalus. Anterior frontal 1.9 cm calcified mass consistent with meningioma unchanged. No significant surrounding vasogenic edema. Electronically Signed   By: Genia Del M.D.   On: 05/16/2015 16:28   Ct Angio Neck W/cm &/or Wo/cm  05/18/2015  CLINICAL DATA:  Weakness for several days, old compromise walking at baseline. Gait  imbalance. Possible LEFT pontine infarct. History of hypertension, hyperlipidemia. EXAM: CT ANGIOGRAPHY HEAD AND NECK TECHNIQUE: Multidetector CT imaging of the head and neck was performed using the standard protocol during bolus administration of intravenous contrast. Multiplanar CT image reconstructions and MIPs were obtained to evaluate the vascular anatomy. Carotid stenosis measurements (when applicable) are obtained utilizing NASCET criteria, using the distal internal carotid diameter as the denominator. CONTRAST:  76m OMNIPAQUE IOHEXOL 350 MG/ML SOLN 50 cc Omnipaque 350 COMPARISON:  CT head May 16, 2015 FINDINGS: CT HEAD The ventricles and sulci are normal for age. No intraparenchymal hemorrhage, mass effect nor midline shift. Confluent supratentorial white matter hypodensities. Subcentimeter LEFT pontine white matter hypodensity persists. No acute large vascular territory infarcts. Old small LEFT basal ganglia lacunar infarcts. No  abnormal intracranial enhancement. No abnormal extra-axial fluid collections. 19 mm anterior cranial fossa meningioma with local mass effect. Basal cisterns are patent. Severe calcific atherosclerosis of the carotid siphons. No skull fracture. The included ocular globes and orbital contents are non-suspicious. Status post LEFT ocular lens implant. The mastoid aircells and included paranasal sinuses are well-aerated. Severe bilateral temporomandibular osteoarthrosis. CTA NECK Aortic arch: Normal appearance of the thoracic arch, normal branch pattern. Moderate calcific atherosclerosis. The origins of the innominate, left Common carotid artery and subclavian artery are widely patent. Right carotid system: Common carotid artery is widely patent, coursing in a straight line fashion. Normal appearance of the carotid bifurcation without hemodynamically significant stenosis by NASCET criteria. Mild eccentric calcific atherosclerosis of the carotid bulb. Normal appearance of the included internal carotid artery. Left carotid system: Common carotid artery is widely patent, coursing in a straight line fashion. Normal appearance of the carotid bifurcation without hemodynamically significant stenosis by NASCET criteria. Mild eccentric calcific atherosclerosis of the carotid bulb. Normal appearance of the included internal carotid artery. Vertebral arteries:RIGHT vertebral artery is dominant. Normal appearance of the vertebral arteries, which appear widely patent. Skeleton: No acute osseous process though bone windows have not been submitted. Other neck: Soft tissues of the neck are nonacute though, not tailored for evaluation. 18 mm nodule isodense to thyroid contiguous with the RIGHT posterior lobe. CTA HEAD Anterior circulation: Normal appearance of the cervical internal carotid arteries, petrous, cavernous and supra clinoid internal carotid arteries. Widely patent anterior communicating artery. LEFT A1 segment is  developmentally dominant. Patent anterior and middle cerebral arteries. Mild stenosis LEFT M2 segment. Posterior circulation: RIGHT vertebral artery is dominant with normal appearance of the vertebral arteries, vertebrobasilar junction and basilar artery, as well as main branch vessels. Moderate tandem stenosis bilateral posterior cerebral artery. No large vessel occlusion, hemodynamically significant stenosis, dissection, luminal irregularity, contrast extravasation or aneurysm within the anterior nor posterior circulation. IMPRESSION: CT HEAD:  No acute intracranial process. Chronic changes including moderate to severe chronic small vessel ischemic disease, and LEFT pontine infarct. Stable 19 mm anterior cranial fossa meningioma. CTA NECK: No hemodynamically significant stenosis or acute vascular process. 18 mm nodule within or contiguous with RIGHT posterior thyroid lobe, recommend thyroid sonogram on a nonemergent basis. CTA HEAD: No acute large vessel occlusion. 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 Mild stenosis LEFT M2 segment. Electronically Signed   By: CElon AlasM.D.   On: 05/18/2015 02:48   Dg Chest Portable 1 View  05/16/2015  CLINICAL DATA:  81year old female with generalized weakness and decreased appetite EXAM: PORTABLE CHEST 1 VIEW COMPARISON:  Prior chest x-ray 04/26/2015 FINDINGS: Stable borderline cardiomegaly with left heart enlargement. Atherosclerotic calcifications again noted in  the transverse aorta. Unchanged position of left subclavian approach cardiac rhythm maintenance device with leads overlying the right atrium and right ventricle. Calcified granuloma in the left lower lobe remains unchanged. Dense calcification is also present surrounding the mitral valve annulus. The lungs are clear. No focal airspace consolidation, pleural effusion, pneumothorax or evidence of pulmonary edema. No suspicious pulmonary mass or nodule.  Stable mild central bronchitic changes and interstitial prominence. No acute osseous abnormality. IMPRESSION: Stable chest x-ray without evidence of acute cardiopulmonary process. Electronically Signed   By: Jacqulynn Cadet M.D.   On: 05/16/2015 15:15    Medical Problem List and Plan: 1.  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  DVT Prophylaxis/Anticoagulation: SCDs. Monitor for any signs of DVT 3. Pain Management: Tylenol as needed 4. Mood/dementia: Aricept 10 mg daily. Discuss baseline cognition with family. Bed alarm for safety 5. Neuropsych: This patient is not capable of making decisions on her own behalf. 6. Skin/Wound Care: Routine skin checks 7. Fluids/Electrolytes/Nutrition: Routine I&O with follow-up chemistry 8. Diabetes mellitus and peripheral neuropathy. Hemoglobin A1c 5.8. Sliding scale insulin. Check blood sugars before meals and at bedtime. Patient on Glucophage XR 500 mg daily prior to admission. Resume as tolerated 9. Hyperlipidemia. 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 Patient is admitted to receive collaborative, interdisciplinary care between the physiatrist, rehab nursing staff, and therapy team. 2. Patient's level of medical complexity and substantial therapy needs in context of that medical necessity cannot be provided at a lesser intensity of care such as a SNF. 3. Patient has experienced substantial functional loss from his/her baseline which was documented above under the "Functional History" and "Functional Status" headings.  Judging by the patient's diagnosis, physical exam, and functional history, the patient has potential for functional progress which will result in measurable gains while on inpatient rehab.  These gains will be of substantial and practical use upon discharge  in facilitating  mobility and self-care at the household level. 4. Physiatrist will provide 24 hour management of medical needs as well as oversight of the therapy plan/treatment and provide guidance as appropriate regarding the interaction of the two. 5. 24 hour rehab nursing will assist with bladder management, bowel management, safety, skin/wound care, disease management, medication administration, pain management and patient education and help integrate therapy concepts, techniques,education, etc. 6. PT will assess and treat for/with: Lower extremity strength, range of motion, stamina, balance, functional mobility, safety, adaptive techniques and equipment, coping skills, pain control, stroke education.   Goals are: Min A/Supervision. 7. OT will assess and treat for/with: ADL's, functional mobility, safety, upper extremity strength, adaptive techniques and equipment, ego support, and community reintegration.   Goals are: Min A/Supverision. Therapy may proceed with showering this patient. 8. SLP will assess and treat for/with: cognition, processing, and higher level cognitive functioning.  Goals are: Min A/Supervision. 9. Case Management and Social Worker will assess and treat for psychological issues and discharge planning. 10. Team conference will be held weekly to assess progress toward goals and to determine barriers to discharge. 11. Patient will receive at least 3 hours of therapy per day at least 5 days per week. 12. ELOS: 14-17 days.       13. Prognosis:  good  Delice Lesch, MD 05/18/2015

## 2015-05-18 NOTE — Progress Notes (Signed)
Echocardiogram 2D Echocardiogram has been performed.  Tresa Res 05/18/2015, 2:08 PM

## 2015-05-18 NOTE — Progress Notes (Signed)
STROKE TEAM PROGRESS NOTE    SUBJECTIVE (INTERVAL HISTORY) No family present. Pt feels she is doing better.   OBJECTIVE Temp:  [97.7 F (36.5 C)-98.5 F (36.9 C)] 97.7 F (36.5 C) (12/21 0504) Pulse Rate:  [60-70] 60 (12/21 0504) Cardiac Rhythm:  [-] A-V Sequential paced;Normal sinus rhythm;Heart block (12/21 0700) Resp:  [18-20] 18 (12/21 0504) BP: (142-188)/(46-71) 173/57 mmHg (12/21 0504) SpO2:  [97 %-100 %] 99 % (12/21 0504)  CBC:   Recent Labs Lab 05/16/15 1448 05/16/15 2210  WBC 8.0 8.5  HGB 12.1 11.9*  HCT 39.6 37.7  MCV 93.0 92.9  PLT 248 [id number]    Basic Metabolic Panel:   Recent Labs Lab 05/16/15 1448 05/16/15 2210  NA 144 144  K 4.0 3.8  CL 107 110  CO2 30 26  GLUCOSE 96 116*  BUN 23* 19  CREATININE 0.91 0.90  CALCIUM 9.2 9.1    Lipid Panel:     Component Value Date/Time   CHOL 128 05/17/2015 0822   TRIG 61 05/17/2015 0822   HDL 65 05/17/2015 0822   CHOLHDL 2.0 05/17/2015 0822   VLDL 12 05/17/2015 0822   LDLCALC 51 05/17/2015 0822   HgbA1c:  Lab Results  Component Value Date   HGBA1C 5.8* 05/17/2015   Urine Drug Screen: No results found for: LABOPIA, COCAINSCRNUR, LABBENZ, AMPHETMU, THCU, LABBARB    IMAGING  Ct Angio Head W/cm &/or Wo Cm  05/18/2015  CLINICAL DATA:  Weakness for several days, old compromise walking at baseline. Gait imbalance. Possible LEFT pontine infarct. History of hypertension, hyperlipidemia. EXAM: CT ANGIOGRAPHY HEAD AND NECK TECHNIQUE: Multidetector CT imaging of the head and neck was performed using the standard protocol during bolus administration of intravenous contrast. Multiplanar CT image reconstructions and MIPs were obtained to evaluate the vascular anatomy. Carotid stenosis measurements (when applicable) are obtained utilizing NASCET criteria, using the distal internal carotid diameter as the denominator. CONTRAST:  61mL OMNIPAQUE IOHEXOL 350 MG/ML SOLN 50 cc Omnipaque 350 COMPARISON:  CT head May 16, 2015 FINDINGS: CT HEAD The ventricles and sulci are normal for age. No intraparenchymal hemorrhage, mass effect nor midline shift. Confluent supratentorial white matter hypodensities. Subcentimeter LEFT pontine white matter hypodensity persists. No acute large vascular territory infarcts. Old small LEFT basal ganglia lacunar infarcts. No abnormal intracranial enhancement. No abnormal extra-axial fluid collections. 19 mm anterior cranial fossa meningioma with local mass effect. Basal cisterns are patent. Severe calcific atherosclerosis of the carotid siphons. No skull fracture. The included ocular globes and orbital contents are non-suspicious. Status post LEFT ocular lens implant. The mastoid aircells and included paranasal sinuses are well-aerated. Severe bilateral temporomandibular osteoarthrosis. CTA NECK Aortic arch: Normal appearance of the thoracic arch, normal branch pattern. Moderate calcific atherosclerosis. The origins of the innominate, left Common carotid artery and subclavian artery are widely patent. Right carotid system: Common carotid artery is widely patent, coursing in a straight line fashion. Normal appearance of the carotid bifurcation without hemodynamically significant stenosis by NASCET criteria. Mild eccentric calcific atherosclerosis of the carotid bulb. Normal appearance of the included internal carotid artery. Left carotid system: Common carotid artery is widely patent, coursing in a straight line fashion. Normal appearance of the carotid bifurcation without hemodynamically significant stenosis by NASCET criteria. Mild eccentric calcific atherosclerosis of the carotid bulb. Normal appearance of the included internal carotid artery. Vertebral arteries:RIGHT vertebral artery is dominant. Normal appearance of the vertebral arteries, which appear widely patent. Skeleton: No acute osseous process though bone windows have not been  submitted. Other neck: Soft tissues of the neck are nonacute  though, not tailored for evaluation. 18 mm nodule isodense to thyroid contiguous with the RIGHT posterior lobe. CTA HEAD Anterior circulation: Normal appearance of the cervical internal carotid arteries, petrous, cavernous and supra clinoid internal carotid arteries. Widely patent anterior communicating artery. LEFT A1 segment is developmentally dominant. Patent anterior and middle cerebral arteries. Mild stenosis LEFT M2 segment. Posterior circulation: RIGHT vertebral artery is dominant with normal appearance of the vertebral arteries, vertebrobasilar junction and basilar artery, as well as main branch vessels. Moderate tandem stenosis bilateral posterior cerebral artery. No large vessel occlusion, hemodynamically significant stenosis, dissection, luminal irregularity, contrast extravasation or aneurysm within the anterior nor posterior circulation. IMPRESSION: CT HEAD:  No acute intracranial process. Chronic changes including moderate to severe chronic small vessel ischemic disease, and LEFT pontine infarct. Stable 19 mm anterior cranial fossa meningioma. CTA NECK: No hemodynamically significant stenosis or acute vascular process. 18 mm nodule within or contiguous with RIGHT posterior thyroid lobe, recommend thyroid sonogram on a nonemergent basis. CTA HEAD: No acute large vessel occlusion. 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 Mild stenosis LEFT M2 segment. Electronically Signed   By: Elon Alas M.D.   On: 05/18/2015 02:48   Ct Head Wo Contrast  05/16/2015  CLINICAL DATA:  81 year old diabetic hypertensive female with generalize weakness. Initial encounter. EXAM: CT HEAD WITHOUT CONTRAST TECHNIQUE: Contiguous axial images were obtained from the base of the skull through the vertex without intravenous contrast. COMPARISON:  03/05/2015. FINDINGS: No intracranial hemorrhage. New from the prior examination is linear low density superior left  paracentral pontine region. Question small acute infarct versus result of streak artifact. Remote basal ganglia infarcts bilaterally. Remote left corona radiata infarct. Prominent small vessel disease type changes. Global atrophy without hydrocephalus. Anterior frontal 1.9 cm calcified mass consistent with meningioma unchanged. No significant surrounding vasogenic edema. Mastoid air cells, middle ear cavities and visualized paranasal sinuses are clear. Post left lens replacement otherwise orbital structures unremarkable. IMPRESSION: No intracranial hemorrhage. New from the prior examination is linear low density superior left paracentral pontine region. Question small acute infarct versus result of streak artifact. Remote basal ganglia infarcts bilaterally. Remote left corona radiata infarct. Prominent small vessel disease type changes. Global atrophy without hydrocephalus. Anterior frontal 1.9 cm calcified mass consistent with meningioma unchanged. No significant surrounding vasogenic edema. Electronically Signed   By: Genia Del M.D.   On: 05/16/2015 16:28   Ct Angio Neck W/cm &/or Wo/cm  05/18/2015  CLINICAL DATA:  Weakness for several days, old compromise walking at baseline. Gait imbalance. Possible LEFT pontine infarct. History of hypertension, hyperlipidemia. EXAM: CT ANGIOGRAPHY HEAD AND NECK TECHNIQUE: Multidetector CT imaging of the head and neck was performed using the standard protocol during bolus administration of intravenous contrast. Multiplanar CT image reconstructions and MIPs were obtained to evaluate the vascular anatomy. Carotid stenosis measurements (when applicable) are obtained utilizing NASCET criteria, using the distal internal carotid diameter as the denominator. CONTRAST:  57mL OMNIPAQUE IOHEXOL 350 MG/ML SOLN 50 cc Omnipaque 350 COMPARISON:  CT head May 16, 2015 FINDINGS: CT HEAD The ventricles and sulci are normal for age. No intraparenchymal hemorrhage, mass effect nor  midline shift. Confluent supratentorial white matter hypodensities. Subcentimeter LEFT pontine white matter hypodensity persists. No acute large vascular territory infarcts. Old small LEFT basal ganglia lacunar infarcts. No abnormal intracranial enhancement. No abnormal extra-axial fluid collections. 19 mm anterior cranial fossa meningioma with local mass effect. Basal cisterns are  patent. Severe calcific atherosclerosis of the carotid siphons. No skull fracture. The included ocular globes and orbital contents are non-suspicious. Status post LEFT ocular lens implant. The mastoid aircells and included paranasal sinuses are well-aerated. Severe bilateral temporomandibular osteoarthrosis. CTA NECK Aortic arch: Normal appearance of the thoracic arch, normal branch pattern. Moderate calcific atherosclerosis. The origins of the innominate, left Common carotid artery and subclavian artery are widely patent. Right carotid system: Common carotid artery is widely patent, coursing in a straight line fashion. Normal appearance of the carotid bifurcation without hemodynamically significant stenosis by NASCET criteria. Mild eccentric calcific atherosclerosis of the carotid bulb. Normal appearance of the included internal carotid artery. Left carotid system: Common carotid artery is widely patent, coursing in a straight line fashion. Normal appearance of the carotid bifurcation without hemodynamically significant stenosis by NASCET criteria. Mild eccentric calcific atherosclerosis of the carotid bulb. Normal appearance of the included internal carotid artery. Vertebral arteries:RIGHT vertebral artery is dominant. Normal appearance of the vertebral arteries, which appear widely patent. Skeleton: No acute osseous process though bone windows have not been submitted. Other neck: Soft tissues of the neck are nonacute though, not tailored for evaluation. 18 mm nodule isodense to thyroid contiguous with the RIGHT posterior lobe. CTA HEAD  Anterior circulation: Normal appearance of the cervical internal carotid arteries, petrous, cavernous and supra clinoid internal carotid arteries. Widely patent anterior communicating artery. LEFT A1 segment is developmentally dominant. Patent anterior and middle cerebral arteries. Mild stenosis LEFT M2 segment. Posterior circulation: RIGHT vertebral artery is dominant with normal appearance of the vertebral arteries, vertebrobasilar junction and basilar artery, as well as main branch vessels. Moderate tandem stenosis bilateral posterior cerebral artery. No large vessel occlusion, hemodynamically significant stenosis, dissection, luminal irregularity, contrast extravasation or aneurysm within the anterior nor posterior circulation. IMPRESSION: CT HEAD:  No acute intracranial process. Chronic changes including moderate to severe chronic small vessel ischemic disease, and LEFT pontine infarct. Stable 19 mm anterior cranial fossa meningioma. CTA NECK: No hemodynamically significant stenosis or acute vascular process. 18 mm nodule within or contiguous with RIGHT posterior thyroid lobe, recommend thyroid sonogram on a nonemergent basis. CTA HEAD: No acute large vessel occlusion. 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 Mild stenosis LEFT M2 segment. Electronically Signed   By: Elon Alas M.D.   On: 05/18/2015 02:48   Dg Chest Portable 1 View  05/16/2015  CLINICAL DATA:  81 year old female with generalized weakness and decreased appetite EXAM: PORTABLE CHEST 1 VIEW COMPARISON:  Prior chest x-ray 04/26/2015 FINDINGS: Stable borderline cardiomegaly with left heart enlargement. Atherosclerotic calcifications again noted in the transverse aorta. Unchanged position of left subclavian approach cardiac rhythm maintenance device with leads overlying the right atrium and right ventricle. Calcified granuloma in the left lower lobe remains unchanged. Dense calcification  is also present surrounding the mitral valve annulus. The lungs are clear. No focal airspace consolidation, pleural effusion, pneumothorax or evidence of pulmonary edema. No suspicious pulmonary mass or nodule. Stable mild central bronchitic changes and interstitial prominence. No acute osseous abnormality. IMPRESSION: Stable chest x-ray without evidence of acute cardiopulmonary process. Electronically Signed   By: Jacqulynn Cadet M.D.   On: 05/16/2015 15:15       PHYSICAL EXAM Frail cachectic malnourised elderly Caucasian lady not in distress. . Afebrile. Head is nontraumatic. Neck is supple without bruit.    Cardiac exam no murmur or gallop. Lungs are clear to auscultation. Distal pulses are well felt. Neurological Exam :  Awake alert oriented 3.  Diminished attention, registration and recall. No aphasia or apraxia dysarthria. Extraocular movements are full range except restriction of vertical upgaze. Pupils irregular but reactive. Fundi were not visualized. Vision acuity seems adequate. Face is symmetric without weakness. Tongue is midline. Motor system exam reveals no upper or lower extremity drift but slight stiffness of the right hip and hamstrings with mild weakness of the right hip and knee. Deep tendon reflexes are 2+ symmetric. Plantars are downgoing. Sensation appears intact bilaterally. Gait was not tested.   ASSESSMENT/PLAN Ms. Briana Thornton is a 81 y.o. female with history of NIDDM, HTN, CHB with pacemaker, and dementia presenting with progressive inability to walk, R hemiparesis. She did not receive IV t-PA due to delay in arrival.   TIA  Resultant  LE weakness/ataxia  MRI  / MRA  Pacer  Repeat CT no acute stroke  CTA head and neck with no large vessel, worrisome stenosis  Carotid Doppler  canceled  2D Echo  pending   LDL 57  HgbA1c 5.5 in Nov  SCDs for VTE prophylaxis Diet heart healthy/carb modified Room service appropriate?: Yes; Fluid consistency:: Thin  No  antithrombotic prior to admission, now on aspirin 325 mg daily  Patient counseled to be compliant with her antithrombotic medications  Ongoing aggressive stroke risk factor management  Therapy recommendations:  CIR. Rehab consult is a maybe. Rehab admissions coordinator is following.  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 Sundowning in the [facility] day # [place] for Pager information 05/18/2015 10:09 AM  I have personally examined this patient, reviewed notes, independently viewed imaging studies, participated in medical decision making and plan of care. I have made any additions or clarifications directly to the above note. Agree with note above.   Antony Contras, MD Medical Director [facility] Pager: [phone] 05/18/2015 8:06 PM   To contact Stroke Continuity provider, please refer to [email or web address] After hours, contact General Neurology

## 2015-05-18 NOTE — Progress Notes (Signed)
Initial Nutrition Assessment  DOCUMENTATION CODES:   Non-severe (moderate) malnutrition in context of chronic illness  INTERVENTION:  Provide Ensure Enlive once daily, provides 350 kcal and 20 grams of protein Encourage PO intake   NUTRITION DIAGNOSIS:   Malnutrition related to poor appetite as evidenced by moderate depletions of muscle mass, moderate depletion of body fat.   GOAL:   Patient will meet greater than or equal to 90% of their needs   MONITOR:   PO intake, Labs, Skin, Weight trends, Supplement acceptance  REASON FOR ASSESSMENT:   Malnutrition Screening Tool    ASSESSMENT:   81 y.o. right handed female with history of hypertension, diabetes mellitus with peripheral neuropathy,CHB with pacemaker, dementia maintained on Aricept. Recently discharged from [place] 3 weeks ago for pneumonia. Presented 05/16/2015 with right sided weakness 1 week. Cranial CT scan shows early left pontine infarct as well as remote basal ganglia infarcts bilaterally, remote left corona radiata infarct.  Pt states that she doesn't have much of an appetite which is normal for her. She usually eats 2-3 small meals per day. She states that she has been maintaining 105 lbs. She denies using nutritional supplements at home. Breakfast tray was in front of patient, untouched. She ate a few bites at time of RD visit, then lunch tray arrived. Pt has moderate muscle and fat wasting per nutrition-focused physical exam.   Labs and medications reviewed.   Diet Order:  Diet heart healthy/carb modified Room service appropriate?: Yes; Fluid consistency:: Thin  Skin:  Reviewed, no issues  Last BM:  12/18  Height:   Ht Readings from Last 1 Encounters:  05/16/15 5\' 2"  (1.575 m)    Weight:   Wt Readings from Last 1 Encounters:  05/16/15 115 lb 4.8 oz (52.3 kg)    Ideal Body Weight:  50 kg  BMI:  Body mass index is 21.08 kg/(m^2).  Estimated Nutritional Needs:    Kcal:  1300-1500  Protein:  55-65 grams  Fluid:  1.3-1.5 L/day  EDUCATION NEEDS:   No education needs identified at this time  [place], LDN Inpatient Clinical Dietitian Pager: [phone] After Hours Pager: [phone]

## 2015-05-18 NOTE — Care Management Note (Signed)
Case Management Note  Patient Details  Name: Briana Thornton MRN: [id number] Date of Birth: [birth year]  Subjective/Objective:  Patient admitted with CVA. Patient is from home with family.                Action/Plan: Plan is to discharge patient today to CIR. No further needs per CM.   Expected Discharge Date:                  Expected Discharge Plan:  [place]  In-House Referral:     Discharge planning Services     Post Acute Care Choice:    Choice offered to:     DME Arranged:    DME Agency:     HH Arranged:    [place] Agency:     Status of Service:  Completed, signed off  Medicare Important Message Given:    Date Medicare IM Given:    Medicare IM give by:    Date Additional Medicare IM Given:    Additional Medicare Important Message give by:     If discussed at [place] of Stay Meetings, dates discussed:    Additional Comments:  Pollie Friar, RN 05/18/2015, 1:41 PM

## 2015-05-18 NOTE — Discharge Instructions (Signed)
Follow with Primary MD Penni Homans, MD in 7 days   Get CBC, CMP,checked  by Primary MD next visit.    Activity: As tolerated with Full fall precautions use walker/cane & assistance as needed   Disposition CIR   Diet: Heart Healthy ** , with feeding assistance and aspiration precautions.  For Heart failure patients - Check your Weight same time everyday, if you gain over 2 pounds, or you develop in leg swelling, experience more shortness of breath or chest pain, call your Primary MD immediately. Follow Cardiac Low Salt Diet and 1.5 lit/day fluid restriction.   On your next visit with your primary care physician please Get Medicines reviewed and adjusted.   Please request your Prim.MD to go over all Hospital Tests and Procedure/Radiological results at the follow up, please get all Hospital records sent to your Prim MD by signing hospital release before you go home.   If you experience worsening of your admission symptoms, develop shortness of breath, life threatening emergency, suicidal or homicidal thoughts you must seek medical attention immediately by calling 911 or calling your MD immediately  if symptoms less severe.  You Must read complete instructions/literature along with all the possible adverse reactions/side effects for all the Medicines you take and that have been prescribed to you. Take any new Medicines after you have completely understood and accpet all the possible adverse reactions/side effects.   Do not drive, operating heavy machinery, perform activities at heights, swimming or participation in water activities or provide baby sitting services if your were admitted for syncope or siezures until you have seen by Primary MD or a Neurologist and advised to do so again.  Do not drive when taking Pain medications.    Do not take more than prescribed Pain, Sleep and Anxiety Medications  Special Instructions: If you have smoked or chewed Tobacco  in the last 2 yrs please  stop smoking, stop any regular Alcohol  and or any Recreational drug use.  Wear Seat belts while driving.   Please note  You were cared for by a hospitalist during your hospital stay. If you have any questions about your discharge medications or the care you received while you were in the hospital after you are discharged, you can call the unit and asked to speak with the hospitalist on call if the hospitalist that took care of you is not available. Once you are discharged, your primary care physician will handle any further medical issues. Please note that NO REFILLS for any discharge medications will be authorized once you are discharged, as it is imperative that you return to your primary care physician (or establish a relationship with a primary care physician if you do not have one) for your aftercare needs so that they can reassess your need for medications and monitor your lab values.

## 2015-05-18 NOTE — Progress Notes (Signed)
Spoke with cardiac ultrasound [id number]. Will change status to STAT and page technologist. Wendee Copp

## 2015-05-18 NOTE — PMR Pre-admission (Signed)
PMR Admission Coordinator Pre-Admission Assessment  Patient: Briana Thornton is an 81 y.o., female MRN: [id number] DOB: 27-Mar-1927 Height: 5\' 2"  (157.5 cm) Weight: 52.3 kg (115 lb 4.8 oz)              Insurance Information HMO:     PPO:      PCP:      IPA:      80/20:      OTHER:  PRIMARY: Medicare A and B      Policy#: [id number]      Subscriber: self CM Name:       Phone#:      Fax#:  Pre-Cert#:       Employer: retired Benefits:  Verified online with [employer] 05/18/15 Phone #:      Name:  Eff. Date: 05/28/93     Deduct: $1288      Out of Pocket Max: none      Life Max: unlimited CIR: 100%      SNF: Days 1-20 100%; Days 21-100 80%  Outpatient: 80%     Co-Pay: 20% Home Health: 100%      Co-Pay: none DME: 80%     Co-Pay: 20% Providers: patient's choice SECONDARY: BCBS      Policy#: [id number]      Subscriber: self CM Name:       Phone#:      Fax#:  Pre-Cert#:       Employer:  Benefits:  Phone #:      Name:  Eff. Date:      Deduct:       Out of Pocket Max:       Life Max:  CIR:       SNF:  Outpatient:      Co-Pay:  Home Health:       Co-Pay:  DME:      Co-Pay:   Medicaid Application Date:       Case Manager:  Disability Application Date:       Case Worker:   Emergency Contact Information Contact Information    Name Relation Home Work Schriever Daughter [phone]   Dorian Heckle [phone]     Amsi, [place]   [phone]     Current Medical History  Patient Admitting Diagnosis: Left pontine infarct with right hemiparesis History of Present Illness: Briana Thornton is a 81 y.o. right handed female with history of hypertension, diabetes mellitus with peripheral neuropathy,CHB with pacemaker, dementia maintained on Aricept. Patient lives with daughter who is a Marine scientist at [employer] and son-in-law. Patient used a walker prior to admission. Family assistance is needed. Recently discharged from [facility] 3 weeks ago for pneumonia. Presented 05/16/2015 with right sided weakness 1 week. Cranial CT scan shows early left pontine infarct as well as remote basal ganglia infarcts bilaterally, remote left corona radiata infarct.Marland Kitchen MRI not completed due to pacemaker. Patient did not receive TPA. Echocardiogram CT angiogram of head and neck with no large vessel occlusion or stenosis. Presently maintained on aspirin for CVA prophylaxis. Tolerating a regular consistency diet. Physical and occupational therapy evaluations completed 05/17/2015 with recommendations of physical medicine rehabilitation consult. Patient was admitted for a comprehensive rehabilitation program.  NIH Total: 5    Past Medical History  Past Medical History  Diagnosis Date  . Chicken pox as a child  . Measles as a child  . IBS (irritable bowel syndrome)   . Diabetes mellitus 47    type  2  . Hyperlipidemia 72  . Hypertension 72  . Vision loss of right eye   . Vertigo 2010    benign  . Calcification of cartilage     ear  . Cardiovascular system disease 07/02/2011  . Hemorrhoid 07/02/2011  . Osteoporosis 07/02/2011  . Weight loss 07/31/2011  . Anemia 05/22/2012  . Broken wrist 10-22-11    left  . Depression 05/22/2012  . Dermatitis 05/22/2012    Right neck    Family History  family history includes Cancer in her brother and maternal grandfather; Diabetes in her daughter, mother, and son; Hypertension in her mother.  Prior Rehab/Hospitalizations:  Has the patient had major surgery during 100 days prior to admission? No  Current Medications   Current facility-administered medications:  .  aspirin suppository 300 mg, 300 mg, Rectal, Daily **OR** aspirin tablet 325 mg, 325 mg, Oral, Daily, Edwin Dada, MD, 325 mg at 05/17/15 2018 .  atorvastatin (LIPITOR) tablet 10 mg, 10 mg, Oral, q1800, Edwin Dada, MD, 10 mg at 05/17/15 1744 .  donepezil (ARICEPT) tablet 10 mg, 10 mg, Oral, Daily, Edwin Dada, MD, 10 mg at 05/18/15 1146 .  feeding supplement (ENSURE ENLIVE) (ENSURE ENLIVE) liquid 237 mL, 237 mL, Oral, Q24H, Reanne J Barbato, RD .  insulin aspart (novoLOG) injection 0-9 Units, 0-9 Units, Subcutaneous, TID WC, Modena Jansky, MD, 0 Units at 05/18/15 0800 .  senna-docusate (Senokot-S) tablet 1 tablet, 1 tablet, Oral, QHS PRN, Edwin Dada, MD  Patients Current Diet: Diet heart healthy/carb modified Room service appropriate?: Yes; Fluid consistency:: Thin  Precautions / Restrictions Precautions Precautions: Fall Restrictions Weight Bearing Restrictions: No   Has the patient had 2 or more falls or a fall with injury in the past year?No, patient had a fall that resulted in a hip fracture about 2 years ago.   Prior Activity Level Household: Patient lives with her daughter, son-in-law, grown grandson and granddaughter-in-law.  They converted a garage for her bedroom and bathroom 5 years ago and provided 24/7 stand by assist prior to admission.  Patient only leaves the house 1-2 times a month for doctor and hair appointments.   Home Assistive Devices / Equipment Home Assistive Devices/Equipment: Environmental consultant (specify type), Transfer belt, Grab bars in shower, Eyeglasses, Bedside commode/3-in-1 (youth RW) Home Equipment: Environmental consultant - 2 wheels, Wheelchair - manual, Shower seat - built in  Prior Device Use: Indicate devices/aids used by the patient prior to current illness, exacerbation or injury? Manual wheelchair and Walker  Prior Functional Level Prior Function Level of Independence: Needs assistance (24/7 stand by assist per daughter) Gait / Transfers Assistance Needed: left SNF  weeks ago walking modified independent with RW; over past week progressivley weaker to point of non-ambulatory and using w/c ADL's / Homemaking Assistance Needed: assist with bathing and dressing  Self Care: Did the patient need help bathing, dressing, using the toilet or eating?  Dependent  for  bathing, toileting and recent need for assist with dressing, patient able to self-feed once set-up Indoor Mobility: Did the patient need assistance with walking from room to room (with or without device)? Needed some help, stand by assist per daughter   Stairs: Did the patient need assistance with internal or external stairs (with or without device)? Dependent  Functional Cognition: Did the patient need help planning regular tasks such as shopping or remembering to take medications? Dependent  Current Functional Level Cognition  Arousal/Alertness: Awake/alert Overall Cognitive Status: No family/caregiver present to determine baseline  cognitive functioning Orientation Level: Oriented to person, Oriented to place, Disoriented to time, Disoriented to situation Attention: Focused Focused Attention: Appears intact Memory: Impaired Memory Impairment: Storage deficit, Retrieval deficit Awareness: Impaired Awareness Impairment: Intellectual impairment Problem Solving: Impaired Problem Solving Impairment: Verbal basic Safety/Judgment: Impaired    Extremity Assessment (includes Sensation/Coordination)  Upper Extremity Assessment:  (around 2+/5 bilateral shoulder flexion)  Lower Extremity Assessment: Defer to PT evaluation RLE Deficits / Details: AROM WFL except ankle DF _40 degrees (tight heel cords); quads 3+ RLE Sensation:  (denies changes) LLE Deficits / Details: AROM WFL except ankle DF _40 degrees (tight heel cords); quads 3+ LLE Sensation:  (denies changes)    ADLs  Overall ADL's : Needs assistance/impaired Upper Body Dressing : Moderate assistance, Sitting Lower Body Dressing: Maximal assistance, Sit to/from stand Toilet Transfer: Maximal assistance, RW (sit to stand from chair) General ADL Comments: Discussed d/c recommendation.    Mobility  Overal bed mobility: Needs Assistance Bed Mobility: Rolling, Sidelying to Sit Rolling: Mod assist Sidelying to sit: Min assist General  bed mobility comments: HOB flat, no rail; incr time with bradykinesia; max cues    Transfers  Overall transfer level: Needs assistance Equipment used: Rolling walker (2 wheeled) Transfers: Sit to/from Stand, [employer] Transfers Sit to Stand: Mod assist Stand pivot transfers: Max assist General transfer comment: incr assist due to posterior lean (partially due to tight heel cords)--less lean than 12/20; difficulty shifting weight onto RLE to allow her to advance LLE; x 2    Ambulation / Gait / Stairs / Wheelchair Mobility  Ambulation/Gait Ambulation/Gait assistance: Mod assist Ambulation Distance (Feet): 10 Feet (toileted, 5) Assistive device: Rolling walker (2 wheeled) Gait Pattern/deviations: Step-to pattern, Decreased stride length, Decreased dorsiflexion - right, Decreased dorsiflexion - left, Leaning posteriorly, Shuffle, Trunk flexed, Narrow base of support General Gait Details: pivotal steps to/from [facility] only Gait velocity interpretation: <1.8 ft/sec, indicative of risk for recurrent falls    Posture / Balance Balance Overall balance assessment: Needs assistance Sitting-balance support: No upper extremity supported, Feet unsupported Sitting balance-[name]: Fair Standing balance support: Bilateral upper extremity supported Standing balance-[name]: Poor Standing balance comment: no righting reactions as she begins to fall posteriorly    Special needs/care consideration BiPAP/CPAP none CPM none Continuous Drip IV n/a Dialysis n/a Life Vest n/a Oxygen n/a Special Bed n/a Trach Size n/a Wound Vac (area) n/a  Skin daughter, who is an Therapist, sports reports right sided coccyx scrape that has been there for 1-2 weeks and is now scabbed over; right thigh irritation that dermatologist has checked; dead right big toe nail; left ankle dry area that again was checked by the dermatologist  Bowel mgmt:n 05/15/15 Bladder mgmt: wore diapers 24/7 prior to admission due to urgency but was  mostly continent during the day; incontinent at night Diabetic mgmt: HgbA1c 5.5 in Nov     Previous Home Environment Living Arrangements: Children  Lives With: Daughter, Other (Comment) (Son-in-law, grandson and granddaughter-in-law) Available Help at Discharge: Family, Available 24 hours/day Type of Home: House Home Layout: One level, Able to live on main level with bedroom/bathroom (transition/threshold to enter her living area) Home Access: Stairs to enter Entrance Stairs-Rails: Left Entrance Stairs-Number of Steps: 5-6 Bathroom Shower/Tub: Other (comment) (walk-in tub) Bathroom Toilet:  (elevated with bedside cammode) Bathroom Accessibility: Yes How Accessible: Accessible via wheelchair, Accessible via walker Home Care Services: Yes Type of Home Care Services: Home PT (with [place]) [place] (if known): [place] Additional Comments: resides at daughter's home  Discharge Living Setting Plans for Discharge Living Setting: Lives with (comment) (daughter and her family ) Type of Home at Discharge: House Discharge Home Layout: One level, Able to live on main level with bedroom/bathroom Discharge Home Access: Stairs to enter Entrance Stairs-Rails: Left Entrance Stairs-Number of Steps: 5-6 Discharge Bathroom Shower/Tub: Other (comment) (walk-in tub) Discharge Bathroom Toilet: Standard (elevated with bedside cammode ) Discharge Bathroom Accessibility: Yes How Accessible: Accessible via wheelchair, Accessible via walker Does the patient have any problems obtaining your medications?: No  Social/Family/Support Systems Patient Roles: Parent Contact Information: daughter: Butler Denmark [phone] Anticipated Caregiver: daughter and son-in-law: Jennette Banker [phone] Ability/Limitations of Caregiver: son-in-law works from home and daughter is an Therapist, sports at [employer] Availability: 24/7 Discharge Plan Discussed with Primary Caregiver: Yes Is Caregiver In Agreement with  Plan?: Yes Does Caregiver/Family have Issues with Lodging/Transportation while Pt is in Rehab?: No   Goals/Additional Needs Patient/Family Goal for Rehab: Supervision-Min assist OT/PT/SLP Expected length of stay: 13-17 days Cultural Considerations: christian-baptist  Dietary Needs: hearth healthy/carm modified diet Equipment Needs: TBD Special Service Needs: none Additional Information: patient has been sundowning since this hospiatalization due to the unfamiliar souroundings Pt/Family Agrees to Admission and willing to participate: Yes Program Orientation Provided & Reviewed with Pt/Caregiver Including Roles  & Responsibilities: Yes Additional Information Needs: none  Decrease burden of Care through IP rehab admission: not anticipated at this time  Possible need for SNF placement upon discharge: not anticipated at this time   Patient Condition: This patient's condition remains as documented in the consult dated 05/17/15, in which the Rehabilitation Physician determined and documented that the patient's condition is appropriate for intensive rehabilitative care in an inpatient rehabilitation facility. Will admit to inpatient rehab today.  Preadmission Screen Completed By:  Gunnar Fusi, 05/18/2015 2:30 PM ______________________________________________________________________   Discussed status with Dr. Posey Pronto on 05/18/15 at 1426 and received telephone approval for admission today.  Admission Coordinator:  Gunnar Fusi, time 1426/Date 05/18/15

## 2015-05-18 NOTE — H&P (Addendum)
Physical Medicine and Rehabilitation Admission H&P    Chief Complaint  Patient presents with  . numbness in legs   : HPI: Briana Thornton is a 81 y.o. right handed female with history of hypertension, diabetes mellitus with peripheral neuropathy,CHB with pacemaker, dementia maintained on Aricept. Patient lives with daughter who is a Marine scientist at [employer] and son-in-law. Patient used a walker prior to admission. Family assistance is needed. Recently discharged from [facility] 3 weeks ago for pneumonia. Presented 05/16/2015 with right sided weakness 1 week. Cranial CT scan shows early left pontine infarct as well as remote basal ganglia infarcts bilaterally, remote left corona radiata infarct.Marland Kitchen MRI not completed due to pacemaker. Patient did not receive TPA. Echocardiogram CT angiogram of head and neck with no large vessel occlusion or stenosis. Presently maintained on aspirin for CVA prophylaxis. Tolerating a regular consistency diet. Physical and occupational therapy evaluations completed 05/17/2015 with recommendations of physical medicine rehabilitation consult. Patient was admitted for a comprehensive rehabilitation program  ROS Constitutional: Negative for fever and chills.  HENT: Negative for hearing loss.  Eyes:   Decreased vision right eye  Respiratory: Negative for cough and shortness of breath.  Cardiovascular: Negative for chest pain and leg swelling.  Gastrointestinal: Positive for constipation.  Genitourinary: Negative for dysuria and flank pain.  Musculoskeletal: Positive for myalgias and joint pain.  Skin: Negative for rash.  Neurological: Positive for weakness. Negative for headaches.  Psychiatric/Behavioral: Positive for depression and memory loss.  All other systems reviewed and are negative   Past Medical History  Diagnosis Date  . Chicken pox as a child  . Measles as a child  . IBS (irritable bowel syndrome)   . Diabetes  mellitus 47    type 2  . Hyperlipidemia 72  . Hypertension 72  . Vision loss of right eye   . Vertigo 2010    benign  . Calcification of cartilage     ear  . Cardiovascular system disease 07/02/2011  . Hemorrhoid 07/02/2011  . Osteoporosis 07/02/2011  . Weight loss 07/31/2011  . Anemia 05/22/2012  . Broken wrist 10-22-11    left  . Depression 05/22/2012  . Dermatitis 05/22/2012    Right neck   Past Surgical History  Procedure Laterality Date  . Hemorroidectomy  1979  . Knee scoped  2002    right  . Rotator cuff repair  2004    right  . Abdominal hysterectomy  2008    partial still has ovaries  . Lens implant left  2006  . Pacemaker insertion  2010  . Wrist surgery  11-05-11    left wrist  . Intramedullary (im) nail intertrochanteric Right 07/24/2013    Procedure: INTRAMEDULLARY (IM) NAIL INTERTROCHANTRIC;  Surgeon: Gearlean Alf, MD;  Location: [facility];  Service: Orthopedics;  Laterality: Right;   Family History  Problem Relation Age of Onset  . Diabetes Mother     type 2  . Hypertension Mother   . Cancer Brother     lung-smoker  . Diabetes Daughter     pre diabetic  . Diabetes Son     type 2  . Cancer Maternal Grandfather     prostate   Social History:  reports that she has never smoked. She has never used smokeless tobacco. She reports that she does not drink alcohol or use illicit drugs. Allergies: No Known Allergies Medications Prior to Admission  Medication Sig Dispense Refill  . acetaminophen (TYLENOL) 325 MG  tablet Take 2 tablets (650 mg total) by mouth every 6 (six) hours as needed for mild pain (or Fever >/= 101). 60 tablet 0  . alum & mag hydroxide-simeth (MAALOX/MYLANTA) 200-200-20 MG/5ML suspension Take 30 mLs by mouth every 6 (six) hours as needed for indigestion or heartburn (dyspepsia). 355 mL 0  . atorvastatin (LIPITOR) 10 MG tablet TAKE 1 TABLET (10 MG TOTAL) BY MOUTH DAILY. 90 tablet 3  . bisacodyl (DULCOLAX) 10 MG suppository Place 1 suppository (10  mg total) rectally daily as needed for moderate constipation. 12 suppository 0  . docusate sodium (COLACE) 100 MG capsule Take 100 mg by mouth every other day.     . donepezil (ARICEPT) 10 MG tablet Take 1 tablet (10 mg total) by mouth at bedtime. (Patient taking differently: Take 10 mg by mouth daily. ) 90 tablet 3  . metFORMIN (GLUCOPHAGE-XR) 500 MG 24 hr tablet TAKE 1 TABLET (500 MG TOTAL) BY MOUTH DAILY WITH BREAKFAST. 90 tablet 3  . methylcellulose (ARTIFICIAL TEARS) 1 % ophthalmic solution Place 1 drop into both eyes as needed (dry eyes).     . Multiple Vitamin (MULTIVITAMIN WITH MINERALS) TABS tablet Take 2 tablets by mouth daily.     . polyethylene glycol (MIRALAX / GLYCOLAX) packet Take 17 g by mouth daily as needed for mild constipation. 14 each 0  . Probiotic Product (PROBIOTIC DAILY PO) Take 1 tablet by mouth daily.    . Ipratropium-Albuterol (COMBIVENT RESPIMAT) 20-100 MCG/ACT AERS respimat Inhale 1 puff into the lungs every 6 (six) hours as needed for wheezing or shortness of breath. (Patient not taking: Reported on 04/26/2015)      Home: Home Living Family/patient expects to be discharged to:: Unsure Living Arrangements: Children Available Help at Discharge: Family Type of Home: House Home Access: [place]: One level Bathroom Shower/Tub: [place]: Environmental consultant - 2 wheels, Wheelchair - manual, Civil engineer, contracting - built in Additional Comments: resides at [employer] home  Lives With: Family   Functional History: Prior Function Level of Independence: Needs assistance Gait / Transfers Assistance Needed: left SNF  weeks ago walking modified independent with RW; over past week progressivley weaker to point of non-ambulatory and using w/c ADL's / Homemaking Assistance Needed: assist with bathing and dressing  Functional Status:  Mobility: Bed Mobility General bed mobility comments: not assessed Transfers Overall transfer level: Needs  assistance Equipment used: Rolling walker (2 wheeled) Transfers: Sit to/from Stand Sit to Stand: Max assist General transfer comment: heavy assist to stand and assist given to scoot further to edge of chair. Ambulation/Gait Ambulation/Gait assistance: Mod assist Ambulation Distance (Feet): 10 Feet (toileted, 5) Assistive device: Rolling walker (2 wheeled) Gait Pattern/deviations: Step-to pattern, Decreased stride length, Decreased dorsiflexion - right, Decreased dorsiflexion - left, Leaning posteriorly, Shuffle, Trunk flexed, Narrow base of support General Gait Details: loses balance posteriorly; requires assist to maneuver RW in all turns; increased difficulty advancing LLE (especially when turning or stepping backwards) Gait velocity interpretation: <1.8 ft/sec, indicative of risk for recurrent falls    ADL: ADL Overall ADL's : Needs assistance/impaired Upper Body Dressing : Moderate assistance, Sitting Lower Body Dressing: Maximal assistance, Sit to/from stand Toilet Transfer: Maximal assistance, RW (sit to stand from chair) General ADL Comments: Discussed d/c recommendation.  Cognition: Cognition Overall Cognitive Status: No family/caregiver present to determine baseline cognitive functioning Arousal/Alertness: Awake/alert Orientation Level: Oriented to person, Oriented to place, Disoriented to time, Disoriented to situation Attention: Focused Focused Attention: Appears intact Memory: Impaired Memory Impairment:  Storage deficit, Retrieval deficit Awareness: Impaired Awareness Impairment: Intellectual impairment Problem Solving: Impaired Problem Solving Impairment: Verbal basic Safety/Judgment: Impaired Cognition Arousal/Alertness: Awake/alert Behavior During Therapy: WFL for tasks assessed/performed Overall Cognitive Status: No family/caregiver present to determine baseline cognitive functioning  Physical Exam: Blood pressure 173/57, pulse 60, temperature 97.7 F (36.5  C), temperature source Axillary, resp. rate 18, height '5\' 2"'$  (1.575 m), weight 52.3 kg (115 lb 4.8 oz), SpO2 99 %. Physical Exam Constitutional: NAD. Frail appearing. Vital signs reviewed.  HENT: Normocephalic, atraumatic Eyes: Conj and EOM normal.   Neck: Normal range of motion. Neck supple. No thyromegaly present.  Cardiovascular: Normal rate and no murmurs.  Respiratory: Effort normal and breath sounds normal. No respiratory distress.  GI: Soft. Bowel sounds are normal. She exhibits no distension.  Neurological: She is alert and oriented x2 with increased time and cues.  Makes good eye contact with examiner.  Follows commands. Limited medical historian.  Motor: RUE 3+/5 proximal to distal.  RLE 2+/5 hip flexion, 3/5 ankle dorsi/plantar flexion.  LUE: 4-/5 proximal to distal LLE 2+ hip flexion, 3+ ankle dorsi/plantar flexion Inconsistent memory and attention  Skin: Skin is warm and dry.  Psychiatric:  Pleasant, cooperative, delayed, confused   Results for orders placed or performed during the hospital encounter of 05/16/15 (from the past 48 hour(s))  CBC     Status: None   Collection Time: 05/16/15  2:48 PM  Result Value Ref Range   WBC 8.0 4.0 - 10.5 K/uL   RBC 4.26 3.87 - 5.11 MIL/uL   Hemoglobin 12.1 12.0 - 15.0 g/dL   HCT 39.6 36.0 - 46.0 %   MCV 93.0 78.0 - 100.0 fL   MCH 28.4 26.0 - 34.0 pg   MCHC 30.6 30.0 - 36.0 g/dL   RDW 14.4 11.5 - 15.5 %   Platelets 248 150 - 400 K/uL  Comprehensive metabolic panel     Status: Abnormal   Collection Time: 05/16/15  2:48 PM  Result Value Ref Range   Sodium 144 135 - 145 mmol/L   Potassium 4.0 3.5 - 5.1 mmol/L   Chloride 107 101 - 111 mmol/L   CO2 30 22 - 32 mmol/L   Glucose, Bld 96 65 - 99 mg/dL   BUN 23 (H) 6 - 20 mg/dL   Creatinine, Ser 0.91 0.44 - 1.00 mg/dL   Calcium 9.2 8.9 - 10.3 mg/dL   Total Protein 6.6 6.5 - 8.1 g/dL   Albumin 3.7 3.5 - 5.0 g/dL   AST 18 15 - 41 U/L   ALT 10 (L) 14 - 54 U/L   Alkaline  Phosphatase 74 38 - 126 U/L   Total Bilirubin 0.8 0.3 - 1.2 mg/dL   GFR calc non Af Amer 56 (L) >60 mL/min   GFR calc Af Amer >60 >60 mL/min    Comment: (NOTE) The eGFR has been calculated using the CKD EPI equation. This calculation has not been validated in all clinical situations. eGFR's persistently <60 mL/min signify possible Chronic Kidney Disease.    Anion gap 7 5 - 15  I-stat troponin, ED     Status: None   Collection Time: 05/16/15  3:28 PM  Result Value Ref Range   Troponin i, poc 0.01 0.00 - 0.08 ng/mL   Comment 3            Comment: Due to the release kinetics of cTnI, a negative result within the first hours of the onset of symptoms does not rule out myocardial  infarction with certainty. If myocardial infarction is still suspected, repeat the test at appropriate intervals.   Urinalysis, Routine w reflex microscopic (not at [facility])     Status: None   Collection Time: 05/16/15  4:06 PM  Result Value Ref Range   Color, Urine YELLOW YELLOW   APPearance CLEAR CLEAR   Specific Gravity, Urine 1.014 1.005 - 1.030   pH 7.5 5.0 - 8.0   Glucose, UA NEGATIVE NEGATIVE mg/dL   Hgb urine dipstick NEGATIVE NEGATIVE   Bilirubin Urine NEGATIVE NEGATIVE   Ketones, ur NEGATIVE NEGATIVE mg/dL   Protein, ur NEGATIVE NEGATIVE mg/dL   Nitrite NEGATIVE NEGATIVE   Leukocytes, UA NEGATIVE NEGATIVE    Comment: MICROSCOPIC NOT DONE ON URINES WITH NEGATIVE PROTEIN, BLOOD, LEUKOCYTES, NITRITE, OR GLUCOSE <1000 mg/dL.  Glucose, capillary     Status: None   Collection Time: 05/16/15  9:51 PM  Result Value Ref Range   Glucose-Capillary 90 65 - 99 mg/dL   Comment 1 Notify RN    Comment 2 Document in Chart   Comprehensive metabolic panel     Status: Abnormal   Collection Time: 05/16/15 10:10 PM  Result Value Ref Range   Sodium 144 135 - 145 mmol/L   Potassium 3.8 3.5 - 5.1 mmol/L   Chloride 110 101 - 111 mmol/L   CO2 26 22 - 32 mmol/L   Glucose, Bld 116 (H) 65 - 99 mg/dL   BUN 19 6 - 20  mg/dL   Creatinine, Ser 1.01 0.44 - 1.00 mg/dL   Calcium 9.1 8.9 - 36.5 mg/dL   Total Protein 5.9 (L) 6.5 - 8.1 g/dL   Albumin 3.2 (L) 3.5 - 5.0 g/dL   AST 17 15 - 41 U/L   ALT 10 (L) 14 - 54 U/L   Alkaline Phosphatase 71 38 - 126 U/L   Total Bilirubin 0.9 0.3 - 1.2 mg/dL   GFR calc non Af Amer 56 (L) >60 mL/min   GFR calc Af Amer >60 >60 mL/min    Comment: (NOTE) The eGFR has been calculated using the CKD EPI equation. This calculation has not been validated in all clinical situations. eGFR's persistently <60 mL/min signify possible Chronic Kidney Disease.    Anion gap 8 5 - 15  CBC     Status: Abnormal   Collection Time: 05/16/15 10:10 PM  Result Value Ref Range   WBC 8.5 4.0 - 10.5 K/uL   RBC 4.06 3.87 - 5.11 MIL/uL   Hemoglobin 11.9 (L) 12.0 - 15.0 g/dL   HCT 78.4 29.2 - 77.2 %   MCV 92.9 78.0 - 100.0 fL   MCH 29.3 26.0 - 34.0 pg   MCHC 31.6 30.0 - 36.0 g/dL   RDW 62.4 20.9 - 11.5 %   Platelets 219 150 - 400 K/uL  Glucose, capillary     Status: None   Collection Time: 05/17/15  6:17 AM  Result Value Ref Range   Glucose-Capillary 74 65 - 99 mg/dL  Hemoglobin J5R     Status: Abnormal   Collection Time: 05/17/15  8:22 AM  Result Value Ref Range   Hgb A1c MFr Bld 5.8 (H) 4.8 - 5.6 %    Comment: (NOTE)         Pre-diabetes: 5.7 - 6.4         Diabetes: >6.4         Glycemic control for adults with diabetes: <7.0    Mean Plasma Glucose 120 mg/dL    Comment: (NOTE) Performed  At: [facility] [place], [place] [id number] Lindon Romp MD PJ:[id number]   Lipid panel     Status: None   Collection Time: 05/17/15  8:22 AM  Result Value Ref Range   Cholesterol 128 0 - 200 mg/dL   Triglycerides 61 <150 mg/dL   HDL 65 >40 mg/dL   Total CHOL/HDL Ratio 2.0 RATIO   VLDL 12 0 - 40 mg/dL   LDL Cholesterol 51 0 - 99 mg/dL    Comment:        Total Cholesterol/HDL:CHD Risk Coronary Heart Disease Risk Table                     Men   Women  1/2 Average  Risk   3.4   3.3  Average Risk       5.0   4.4  2 X Average Risk   9.6   7.1  3 X Average Risk  23.4   11.0        Use the calculated Patient Ratio above and the CHD Risk Table to determine the patient's CHD Risk.        ATP III CLASSIFICATION (LDL):  <100     mg/dL   Optimal  100-129  mg/dL   Near or Above                    Optimal  130-159  mg/dL   Borderline  160-189  mg/dL   High  >190     mg/dL   Very High   Glucose, capillary     Status: Abnormal   Collection Time: 05/17/15 11:13 AM  Result Value Ref Range   Glucose-Capillary 111 (H) 65 - 99 mg/dL   Comment 1 Notify RN    Comment 2 Document in Chart   Glucose, capillary     Status: Abnormal   Collection Time: 05/17/15  4:06 PM  Result Value Ref Range   Glucose-Capillary 127 (H) 65 - 99 mg/dL  Glucose, capillary     Status: None   Collection Time: 05/17/15 10:15 PM  Result Value Ref Range   Glucose-Capillary 90 65 - 99 mg/dL   Comment 1 Notify RN    Comment 2 Document in Chart    Ct Angio Head W/cm &/or Wo Cm  05/18/2015  CLINICAL DATA:  Weakness for several days, old compromise walking at baseline. Gait imbalance. Possible LEFT pontine infarct. History of hypertension, hyperlipidemia. EXAM: CT ANGIOGRAPHY HEAD AND NECK TECHNIQUE: Multidetector CT imaging of the head and neck was performed using the standard protocol during bolus administration of intravenous contrast. Multiplanar CT image reconstructions and MIPs were obtained to evaluate the vascular anatomy. Carotid stenosis measurements (when applicable) are obtained utilizing NASCET criteria, using the distal internal carotid diameter as the denominator. CONTRAST:  30m OMNIPAQUE IOHEXOL 350 MG/ML SOLN 50 cc Omnipaque 350 COMPARISON:  CT head May 16, 2015 FINDINGS: CT HEAD The ventricles and sulci are normal for age. No intraparenchymal hemorrhage, mass effect nor midline shift. Confluent supratentorial white matter hypodensities. Subcentimeter LEFT pontine white  matter hypodensity persists. No acute large vascular territory infarcts. Old small LEFT basal ganglia lacunar infarcts. No abnormal intracranial enhancement. No abnormal extra-axial fluid collections. 19 mm anterior cranial fossa meningioma with local mass effect. Basal cisterns are patent. Severe calcific atherosclerosis of the carotid siphons. No skull fracture. The included ocular globes and orbital contents are non-suspicious. Status post LEFT ocular lens implant. The mastoid aircells and included  paranasal sinuses are well-aerated. Severe bilateral temporomandibular osteoarthrosis. CTA NECK Aortic arch: Normal appearance of the thoracic arch, normal branch pattern. Moderate calcific atherosclerosis. The origins of the innominate, left Common carotid artery and subclavian artery are widely patent. Right carotid system: Common carotid artery is widely patent, coursing in a straight line fashion. Normal appearance of the carotid bifurcation without hemodynamically significant stenosis by NASCET criteria. Mild eccentric calcific atherosclerosis of the carotid bulb. Normal appearance of the included internal carotid artery. Left carotid system: Common carotid artery is widely patent, coursing in a straight line fashion. Normal appearance of the carotid bifurcation without hemodynamically significant stenosis by NASCET criteria. Mild eccentric calcific atherosclerosis of the carotid bulb. Normal appearance of the included internal carotid artery. Vertebral arteries:RIGHT vertebral artery is dominant. Normal appearance of the vertebral arteries, which appear widely patent. Skeleton: No acute osseous process though bone windows have not been submitted. Other neck: Soft tissues of the neck are nonacute though, not tailored for evaluation. 18 mm nodule isodense to thyroid contiguous with the RIGHT posterior lobe. CTA HEAD Anterior circulation: Normal appearance of the cervical internal carotid arteries, petrous,  cavernous and supra clinoid internal carotid arteries. Widely patent anterior communicating artery. LEFT A1 segment is developmentally dominant. Patent anterior and middle cerebral arteries. Mild stenosis LEFT M2 segment. Posterior circulation: RIGHT vertebral artery is dominant with normal appearance of the vertebral arteries, vertebrobasilar junction and basilar artery, as well as main branch vessels. Moderate tandem stenosis bilateral posterior cerebral artery. No large vessel occlusion, hemodynamically significant stenosis, dissection, luminal irregularity, contrast extravasation or aneurysm within the anterior nor posterior circulation. IMPRESSION: CT HEAD:  No acute intracranial process. Chronic changes including moderate to severe chronic small vessel ischemic disease, and LEFT pontine infarct. Stable 19 mm anterior cranial fossa meningioma. CTA NECK: No hemodynamically significant stenosis or acute vascular process. 18 mm nodule within or contiguous with RIGHT posterior thyroid lobe, recommend thyroid sonogram on a nonemergent basis. CTA HEAD: No acute large vessel occlusion. 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 Mild stenosis LEFT M2 segment. Electronically Signed   By: Elon Alas M.D.   On: 05/18/2015 02:48   Ct Head Wo Contrast  05/16/2015  CLINICAL DATA:  81 year old diabetic hypertensive female with generalize weakness. Initial encounter. EXAM: CT HEAD WITHOUT CONTRAST TECHNIQUE: Contiguous axial images were obtained from the base of the skull through the vertex without intravenous contrast. COMPARISON:  03/05/2015. FINDINGS: No intracranial hemorrhage. New from the prior examination is linear low density superior left paracentral pontine region. Question small acute infarct versus result of streak artifact. Remote basal ganglia infarcts bilaterally. Remote left corona radiata infarct. Prominent small vessel disease type changes. Global  atrophy without hydrocephalus. Anterior frontal 1.9 cm calcified mass consistent with meningioma unchanged. No significant surrounding vasogenic edema. Mastoid air cells, middle ear cavities and visualized paranasal sinuses are clear. Post left lens replacement otherwise orbital structures unremarkable. IMPRESSION: No intracranial hemorrhage. New from the prior examination is linear low density superior left paracentral pontine region. Question small acute infarct versus result of streak artifact. Remote basal ganglia infarcts bilaterally. Remote left corona radiata infarct. Prominent small vessel disease type changes. Global atrophy without hydrocephalus. Anterior frontal 1.9 cm calcified mass consistent with meningioma unchanged. No significant surrounding vasogenic edema. Electronically Signed   By: Genia Del M.D.   On: 05/16/2015 16:28   Ct Angio Neck W/cm &/or Wo/cm  05/18/2015  CLINICAL DATA:  Weakness for several days, old compromise walking at baseline. Gait  imbalance. Possible LEFT pontine infarct. History of hypertension, hyperlipidemia. EXAM: CT ANGIOGRAPHY HEAD AND NECK TECHNIQUE: Multidetector CT imaging of the head and neck was performed using the standard protocol during bolus administration of intravenous contrast. Multiplanar CT image reconstructions and MIPs were obtained to evaluate the vascular anatomy. Carotid stenosis measurements (when applicable) are obtained utilizing NASCET criteria, using the distal internal carotid diameter as the denominator. CONTRAST:  30m OMNIPAQUE IOHEXOL 350 MG/ML SOLN 50 cc Omnipaque 350 COMPARISON:  CT head May 16, 2015 FINDINGS: CT HEAD The ventricles and sulci are normal for age. No intraparenchymal hemorrhage, mass effect nor midline shift. Confluent supratentorial white matter hypodensities. Subcentimeter LEFT pontine white matter hypodensity persists. No acute large vascular territory infarcts. Old small LEFT basal ganglia lacunar infarcts. No  abnormal intracranial enhancement. No abnormal extra-axial fluid collections. 19 mm anterior cranial fossa meningioma with local mass effect. Basal cisterns are patent. Severe calcific atherosclerosis of the carotid siphons. No skull fracture. The included ocular globes and orbital contents are non-suspicious. Status post LEFT ocular lens implant. The mastoid aircells and included paranasal sinuses are well-aerated. Severe bilateral temporomandibular osteoarthrosis. CTA NECK Aortic arch: Normal appearance of the thoracic arch, normal branch pattern. Moderate calcific atherosclerosis. The origins of the innominate, left Common carotid artery and subclavian artery are widely patent. Right carotid system: Common carotid artery is widely patent, coursing in a straight line fashion. Normal appearance of the carotid bifurcation without hemodynamically significant stenosis by NASCET criteria. Mild eccentric calcific atherosclerosis of the carotid bulb. Normal appearance of the included internal carotid artery. Left carotid system: Common carotid artery is widely patent, coursing in a straight line fashion. Normal appearance of the carotid bifurcation without hemodynamically significant stenosis by NASCET criteria. Mild eccentric calcific atherosclerosis of the carotid bulb. Normal appearance of the included internal carotid artery. Vertebral arteries:RIGHT vertebral artery is dominant. Normal appearance of the vertebral arteries, which appear widely patent. Skeleton: No acute osseous process though bone windows have not been submitted. Other neck: Soft tissues of the neck are nonacute though, not tailored for evaluation. 18 mm nodule isodense to thyroid contiguous with the RIGHT posterior lobe. CTA HEAD Anterior circulation: Normal appearance of the cervical internal carotid arteries, petrous, cavernous and supra clinoid internal carotid arteries. Widely patent anterior communicating artery. LEFT A1 segment is  developmentally dominant. Patent anterior and middle cerebral arteries. Mild stenosis LEFT M2 segment. Posterior circulation: RIGHT vertebral artery is dominant with normal appearance of the vertebral arteries, vertebrobasilar junction and basilar artery, as well as main branch vessels. Moderate tandem stenosis bilateral posterior cerebral artery. No large vessel occlusion, hemodynamically significant stenosis, dissection, luminal irregularity, contrast extravasation or aneurysm within the anterior nor posterior circulation. IMPRESSION: CT HEAD:  No acute intracranial process. Chronic changes including moderate to severe chronic small vessel ischemic disease, and LEFT pontine infarct. Stable 19 mm anterior cranial fossa meningioma. CTA NECK: No hemodynamically significant stenosis or acute vascular process. 18 mm nodule within or contiguous with RIGHT posterior thyroid lobe, recommend thyroid sonogram on a nonemergent basis. CTA HEAD: No acute large vessel occlusion. 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 Mild stenosis LEFT M2 segment. Electronically Signed   By: CElon AlasM.D.   On: 05/18/2015 02:48   Dg Chest Portable 1 View  05/16/2015  CLINICAL DATA:  81year old female with generalized weakness and decreased appetite EXAM: PORTABLE CHEST 1 VIEW COMPARISON:  Prior chest x-ray 04/26/2015 FINDINGS: Stable borderline cardiomegaly with left heart enlargement. Atherosclerotic calcifications again noted in  the transverse aorta. Unchanged position of left subclavian approach cardiac rhythm maintenance device with leads overlying the right atrium and right ventricle. Calcified granuloma in the left lower lobe remains unchanged. Dense calcification is also present surrounding the mitral valve annulus. The lungs are clear. No focal airspace consolidation, pleural effusion, pneumothorax or evidence of pulmonary edema. No suspicious pulmonary mass or nodule.  Stable mild central bronchitic changes and interstitial prominence. No acute osseous abnormality. IMPRESSION: Stable chest x-ray without evidence of acute cardiopulmonary process. Electronically Signed   By: Jacqulynn Cadet M.D.   On: 05/16/2015 15:15    Medical Problem List and Plan: 1.  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  DVT Prophylaxis/Anticoagulation: SCDs. Monitor for any signs of DVT 3. Pain Management: Tylenol as needed 4. Mood/dementia: Aricept 10 mg daily. Discuss baseline cognition with family. Bed alarm for safety 5. Neuropsych: This patient is not capable of making decisions on her own behalf. 6. Skin/Wound Care: Routine skin checks 7. Fluids/Electrolytes/Nutrition: Routine I&O with follow-up chemistry 8. Diabetes mellitus and peripheral neuropathy. Hemoglobin A1c 5.8. Sliding scale insulin. Check blood sugars before meals and at bedtime. Patient on Glucophage XR 500 mg daily prior to admission. Resume as tolerated 9. Hyperlipidemia. 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 Patient is admitted to receive collaborative, interdisciplinary care between the physiatrist, rehab nursing staff, and therapy team. 2. Patient's level of medical complexity and substantial therapy needs in context of that medical necessity cannot be provided at a lesser intensity of care such as a SNF. 3. Patient has experienced substantial functional loss from his/her baseline which was documented above under the "Functional History" and "Functional Status" headings.  Judging by the patient's diagnosis, physical exam, and functional history, the patient has potential for functional progress which will result in measurable gains while on inpatient rehab.  These gains will be of substantial and practical use upon discharge  in facilitating  mobility and self-care at the household level. 4. Physiatrist will provide 24 hour management of medical needs as well as oversight of the therapy plan/treatment and provide guidance as appropriate regarding the interaction of the two. 5. 24 hour rehab nursing will assist with bladder management, bowel management, safety, skin/wound care, disease management, medication administration, pain management and patient education and help integrate therapy concepts, techniques,education, etc. 6. PT will assess and treat for/with: Lower extremity strength, range of motion, stamina, balance, functional mobility, safety, adaptive techniques and equipment, coping skills, pain control, stroke education.   Goals are: Min A/Supervision. 7. OT will assess and treat for/with: ADL's, functional mobility, safety, upper extremity strength, adaptive techniques and equipment, ego support, and community reintegration.   Goals are: Min A/Supverision. Therapy may proceed with showering this patient. 8. SLP will assess and treat for/with: cognition, processing, and higher level cognitive functioning.  Goals are: Min A/Supervision. 9. Case Management and Social Worker will assess and treat for psychological issues and discharge planning. 10. Team conference will be held weekly to assess progress toward goals and to determine barriers to discharge. 11. Patient will receive at least 3 hours of therapy per day at least 5 days per week. 12. ELOS: 14-17 days.       13. Prognosis:  good  Delice Lesch, MD 05/18/2015

## 2015-05-18 NOTE — Progress Notes (Addendum)
Physical Therapy Treatment Patient Details Name: Briana Thornton MRN: [id number] DOB: 1926/12/12 Today's Date: 05/18/2015    History of Present Illness 81 y.o. female with a past medical history significant for NIDDM, HTN, CHB with pacemaker, and dementia who presents with one week progressive weakness, foot dragging, difficulty swallowing, and numbness. CT suggests possible left pontine infarct. Remote basal ganglia infarcts bilaterally. Remote left corona infarct. D/Cd from SNF and living with family last three weeks.      PT Comments    Patient moves slowly, however required less assist today. Continued difficulty weight-shifting to her RLE to allow advancement of LLE.   Follow Up Recommendations  CIR     Equipment Recommendations  None recommended by PT    Recommendations for Other Services       Precautions / Restrictions Precautions Precautions: Fall Restrictions Weight Bearing Restrictions: No    Mobility  Bed Mobility Overal bed mobility: Needs Assistance Bed Mobility: Rolling;Sidelying to Sit Rolling: Mod assist Sidelying to sit: Min assist       General bed mobility comments: HOB flat, no rail; incr time with bradykinesia; max cues  Transfers Overall transfer level: Needs assistance Equipment used: Rolling walker (2 wheeled) Transfers: Sit to/from [facility] Sit to Stand: Mod assist Stand pivot transfers: Max assist       General transfer comment: incr assist due to posterior lean (partially due to tight heel cords)--less lean than 12/20; difficulty shifting weight onto RLE to allow her to advance LLE; x 2  Ambulation/Gait             General Gait Details: pivotal steps to/from [facility] only   Stairs            Wheelchair Mobility    Modified Rankin (Stroke Patients Only) Modified Rankin (Stroke Patients Only) Pre-Morbid Rankin Score: Moderate disability Modified Rankin: Moderately severe disability     Balance      Sitting balance-[name]: Fair     Standing balance support: Bilateral upper extremity supported Standing balance-[name]: Poor                      Cognition Arousal/Alertness: Awake/alert Behavior During Therapy: WFL for tasks assessed/performed Overall Cognitive Status: No family/caregiver present to determine baseline cognitive functioning                      Exercises General Exercises - Lower Extremity Ankle Circles/Pumps: AROM;Both;10 reps (followed by heel cord stretch) Heel Slides: AROM;Both;5 reps Other Exercises Other Exercises: bridging with feet stabilized x 5    General Comments General comments (skin integrity, edema, etc.): better ability to scoot herself in sitting (forwards to EOB and back into chair)      Pertinent Vitals/Pain Pain Assessment: No/denies pain    Home Living                      Prior Function            PT Goals (current goals can now be found in the care plan section) Acute Rehab PT Goals Patient Stated Goal: return home  Time For Goal Achievement: 05/31/15 Progress towards PT goals: Progressing toward goals    Frequency  Min 3X/week    PT Plan Current plan remains appropriate    Co-evaluation             End of Session Equipment Utilized During Treatment: Gait belt Activity Tolerance: Patient tolerated treatment well Patient left:  in chair;with call bell/phone within reach;with chair alarm set     Time: [phone] PT Time Calculation (min) (ACUTE ONLY): 35 min  Charges:  $Gait Training: 8-22 mins $Therapeutic Activity: 8-22 mins                    G Codes:      Margreat Widener May 27, 2015, 10:19 AM Pager [phone]

## 2015-05-18 NOTE — Progress Notes (Signed)
Inpatient Rehabilitation  Met with patient to discuss IP Rehab admission.  Patient requested that [facility] discuss it with her daughter.  I spoke with daughter, [name] on the phone and she is agreeable to an IP Rehab admission.  I also spoke with Dr. Waldron Labs who gave medical clearance for admission today.  Plan to admit patient later today.  Notified CSW and RN CM.  Please call with questions  Carmelia Roller., CCC/SLP [facility]  Cell [phone]

## 2015-05-19 ENCOUNTER — Inpatient Hospital Stay (HOSPITAL_COMMUNITY): Payer: Medicare Other | Admitting: Occupational Therapy

## 2015-05-19 ENCOUNTER — Inpatient Hospital Stay (HOSPITAL_COMMUNITY): Payer: Medicare Other | Admitting: Physical Therapy

## 2015-05-19 ENCOUNTER — Inpatient Hospital Stay (HOSPITAL_COMMUNITY): Payer: Medicare Other | Admitting: Speech Pathology

## 2015-05-19 DIAGNOSIS — E8809 Other disorders of plasma-protein metabolism, not elsewhere classified: Secondary | ICD-10-CM | POA: Insufficient documentation

## 2015-05-19 DIAGNOSIS — E46 Unspecified protein-calorie malnutrition: Secondary | ICD-10-CM | POA: Insufficient documentation

## 2015-05-19 LAB — CBC WITH DIFFERENTIAL/PLATELET
BASOS PCT: 0 %
Basophils Absolute: 0 10*3/uL (ref 0.0–0.1)
EOS ABS: 0.3 10*3/uL (ref 0.0–0.7)
EOS PCT: 3 %
HCT: 36.2 % (ref 36.0–46.0)
Hemoglobin: 11.4 g/dL — ABNORMAL LOW (ref 12.0–15.0)
LYMPHS PCT: 19 %
Lymphs Abs: 1.8 10*3/uL (ref 0.7–4.0)
MCH: 28.9 pg (ref 26.0–34.0)
MCHC: 31.5 g/dL (ref 30.0–36.0)
MCV: 91.9 fL (ref 78.0–100.0)
MONO ABS: 0.6 10*3/uL (ref 0.1–1.0)
Monocytes Relative: 6 %
NEUTROS ABS: 6.6 10*3/uL (ref 1.7–7.7)
Neutrophils Relative %: 72 %
Platelets: 249 10*3/uL (ref 150–400)
RBC: 3.94 MIL/uL (ref 3.87–5.11)
RDW: 14.3 % (ref 11.5–15.5)
WBC: 9.2 10*3/uL (ref 4.0–10.5)

## 2015-05-19 LAB — COMPREHENSIVE METABOLIC PANEL
ALBUMIN: 2.9 g/dL — AB (ref 3.5–5.0)
ALT: 9 U/L — ABNORMAL LOW (ref 14–54)
ANION GAP: 10 (ref 5–15)
AST: 13 U/L — ABNORMAL LOW (ref 15–41)
Alkaline Phosphatase: 65 U/L (ref 38–126)
BUN: 15 mg/dL (ref 6–20)
CALCIUM: 8.9 mg/dL (ref 8.9–10.3)
CO2: 26 mmol/L (ref 22–32)
Chloride: 105 mmol/L (ref 101–111)
Creatinine, Ser: 0.84 mg/dL (ref 0.44–1.00)
GFR calc non Af Amer: >60 mL/min (ref >60)
GLUCOSE: 87 mg/dL (ref 65–99)
POTASSIUM: 3.4 mmol/L — AB (ref 3.5–5.1)
SODIUM: 141 mmol/L (ref 135–145)
TOTAL PROTEIN: 5.3 g/dL — AB (ref 6.5–8.1)
Total Bilirubin: 1 mg/dL (ref 0.3–1.2)

## 2015-05-19 LAB — GLUCOSE, CAPILLARY
GLUCOSE-CAPILLARY: 102 mg/dL — AB (ref 65–99)
GLUCOSE-CAPILLARY: 144 mg/dL — AB (ref 65–99)
Glucose-Capillary: 92 mg/dL (ref 65–99)

## 2015-05-19 MED ORDER — BENEPROTEIN PO POWD
1.0000 | Freq: Three times a day (TID) | ORAL | Status: DC
  Filled 2015-05-19: qty 227

## 2015-05-19 MED ORDER — POTASSIUM CHLORIDE 20 MEQ PO PACK
20.0000 meq | PACK | Freq: Two times a day (BID) | ORAL | Status: AC
  Administered 2015-05-19 (×2): 20 meq via ORAL
  Filled 2015-05-19 (×2): qty 1

## 2015-05-19 MED ORDER — GLUCERNA SHAKE PO LIQD
237.0000 mL | Freq: Two times a day (BID) | ORAL | Status: DC
  Administered 2015-05-19 – 2015-05-24 (×4): 237 mL via ORAL

## 2015-05-19 NOTE — Progress Notes (Signed)
Patient information reviewed and entered into eRehab system by Jamira Barfuss, RN, CRRN, PPS Coordinator.  Information including medical coding and functional independence measure will be reviewed and updated through discharge.     Per nursing patient was given "Data Collection Information Summary for Patients in Inpatient Rehabilitation Facilities with attached "Privacy Act Statement-Health Care Records" upon admission.  

## 2015-05-19 NOTE — Evaluation (Signed)
Physical Therapy Assessment and Plan  Patient Details  Name: Briana Thornton MRN: [id number] Date of Birth: 1926-12-25  PT Diagnosis: Abnormal posture, Abnormality of gait, Cognitive deficits, Coordination disorder, Difficulty walking, Hemiplegia non-dominant, Hypertonia, Impaired cognition and Muscle weakness Rehab Potential: Fair ELOS: 2 to 2.5 weeks   Today's Date: 05/19/2015 PT Individual Time: 0830-0930 PT Individual Time Calculation (min): 60 min    Problem List:  Patient Active Problem List   Diagnosis Date Noted  . Hypoalbuminemia due to protein-calorie malnutrition ([place])   . Malnutrition of moderate degree 05/18/2015  . TIA (transient ischemic attack) 05/18/2015  . Left pontine cerebrovascular accident ([place]) 05/18/2015  . DM type 2 with diabetic peripheral neuropathy ([place]) 05/18/2015  . CAP (community acquired pneumonia) 03/05/2015  . SOB (shortness of breath) 03/05/2015  . Hypoxia 03/05/2015  . Weakness 03/05/2015  . Dehydration, mild 03/05/2015  . Controlled type 2 diabetes mellitus with diabetic nephropathy, without long-term current use of insulin ([place])   . Onychomycosis 10/31/2014  . UTI symptoms 01/25/2014  . Dysphagia 01/25/2014  . Abnormal finding on urinalysis 01/25/2014  . Dementia without behavioral disturbance 08/06/2013  . Postoperative anemia due to acute blood loss 07/26/2013  . Hip fracture ([place]) 07/24/2013  . Thyroid mass of unclear etiology 07/24/2013  . Fall at home 07/24/2013  . Cerumen impaction 04/21/2013  . Cognitive decline 02/15/2013  . Anemia 05/22/2012  . Depression 05/22/2012  . Dermatitis 05/22/2012  . Broken wrist   . Pacemaker-St.Jude 09/18/2011  . URI (upper respiratory infection) 08/31/2011  . Lymph node enlargement 08/31/2011  . Weight loss 07/31/2011  . Cardiovascular system disease 07/02/2011  . Hemorrhoid 07/02/2011  . Osteoporosis 07/02/2011  . Essential hypertension   . Hyperlipidemia   . IBS (irritable bowel syndrome)    . DM (diabetes mellitus) ([place])   . Calcification of cartilage   . BPPV (benign paroxysmal positional vertigo)   . Vision loss of right eye     Past Medical History:  Past Medical History  Diagnosis Date  . Chicken pox as a child  . Measles as a child  . IBS (irritable bowel syndrome)   . Diabetes mellitus 47    type 2  . Hyperlipidemia 72  . Hypertension 72  . Vision loss of right eye   . Vertigo 2010    benign  . Calcification of cartilage     ear  . Cardiovascular system disease 07/02/2011  . Hemorrhoid 07/02/2011  . Osteoporosis 07/02/2011  . Weight loss 07/31/2011  . Anemia 05/22/2012  . Broken wrist 10-22-11    left  . Depression 05/22/2012  . Dermatitis 05/22/2012    Right neck  . DM type 2 with diabetic peripheral neuropathy ([place]) 05/18/2015   Past Surgical History:  Past Surgical History  Procedure Laterality Date  . Hemorroidectomy  1979  . Knee scoped  2002    right  . Rotator cuff repair  2004    right  . Abdominal hysterectomy  2008    partial still has ovaries  . Lens implant left  2006  . Pacemaker insertion  2010  . Wrist surgery  11-05-11    left wrist  . Intramedullary (im) nail intertrochanteric Right 07/24/2013    Procedure: INTRAMEDULLARY (IM) NAIL INTERTROCHANTRIC;  Surgeon: Gearlean Alf, MD;  Location: [facility];  Service: Orthopedics;  Laterality: Right;    Assessment & Plan Clinical Impression: Briana Thornton is a 81 y.o. right handed female with history of hypertension, diabetes mellitus with peripheral neuropathy,CHB with  pacemaker, dementia maintained on Aricept. Patient lives with daughter who is a Engineer, civil (consulting) at [employer] and son-in-law. Patient used a walker prior to admission. Family assistance is needed. Recently discharged from [facility] skilled nursing facility 3 weeks ago for pneumonia. Presented 05/16/2015 with right sided weakness 1 week. Cranial CT scan shows early left pontine infarct as well as remote basal ganglia infarcts  bilaterally, remote left corona radiata infarct.[place] Kitchen MRI not completed due to pacemaker. Patient did not receive TPA. Echocardiogram CT angiogram of head and neck with no large vessel occlusion or stenosis. Presently maintained on aspirin for CVA prophylaxis. Tolerating a regular consistency diet. Physical and occupational therapy evaluations completed 05/17/2015 with recommendations of physical medicine rehabilitation consult.  Patient transferred to CIR on 05/18/2015 .   Patient currently requires total with mobility secondary to muscle weakness and muscle joint tightness, decreased cardiorespiratoy endurance, abnormal tone and decreased coordination, decreased initiation, decreased attention, decreased awareness, decreased problem solving, decreased safety awareness, decreased memory and delayed processing and decreased sitting balance, decreased standing balance, decreased postural control, hemiplegia and decreased balance strategies.  Prior to hospitalization, patient was modified independent  with mobility and lived with Daughter, Other (Comment) (SIL, grandson, grandaughter-in-law, and their new baby) in a House home.  Home access is 5-6Stairs to enter (no ramp).  Patient will benefit from skilled PT intervention to maximize safe functional mobility, minimize fall risk and decrease caregiver burden for planned discharge home with 24 hour assist.  Anticipate patient will benefit from follow up [facility] at discharge.  PT - End of Session Activity Tolerance: Tolerates < 10 min activity, no significant change in vital signs Endurance Deficit: Yes Endurance Deficit Description: cardiorespiratory PT Assessment Rehab Potential (ACUTE/IP ONLY): Fair Barriers to Discharge: Inaccessible home environment Barriers to Discharge Comments: ramp previously in place has been taken down due to being constructed with too steep of a slope PT Patient demonstrates impairments in the following area(s): Cabin crew;Endurance;Motor;Nutrition;Pain;Safety;Sensory PT Transfers Functional Problem(s): Bed Mobility;Bed to Chair;Car;Furniture PT Locomotion Functional Problem(s): Ambulation;Wheelchair Mobility;Stairs PT Plan PT Intensity: Minimum of 1-2 x/day ,45 to 90 minutes PT Frequency: 5 out of 7 days PT Duration Estimated Length of Stay: 2 to 2.5 weeks PT Treatment/Interventions: Ambulation/gait training;Discharge planning;Functional mobility training;Psychosocial support;Therapeutic Activities;Balance/vestibular training;Disease management/prevention;Neuromuscular re-education;Skin care/wound management;Therapeutic Exercise;Wheelchair propulsion/positioning;Cognitive remediation/compensation;DME/adaptive equipment instruction;Pain management;Splinting/orthotics;UE/LE Strength taining/ROM;Patient/family education;Stair training;UE/LE Coordination activities PT Transfers Anticipated Outcome(s): min A PT Locomotion Anticipated Outcome(s): min A short distance gait; min A short distance w/c PT Recommendation Follow Up Recommendations: 24 hour supervision/assistance;Home health PT Patient destination: Home Equipment Recommended: To be determined  Skilled Therapeutic Intervention Pt received in bed and daughter entered PT eval session partially into it and was helpful in answering questions. PT Evaluation - PT notes pt to have deficits in most cognitive domains - difficult to tell if this is different from baseline as pt has a PMH significant for dementia. Pt also presents with mild-moderate R side hemiplegia, decreased motor control of R side, poor posture and spinal deformities (likely baseline), and severely impaired balance - including strong retropulsion in stand. Pt has difficulty with all aspects of functional mobility and has very limited activity tolerance. Gait Training - PT instructs pt in ambulation with RW x 10' req max A from PT and +2 for w/c follow. Therapeutic Activity - PT instructs pt in  rolling R/L in flat bed without rail req max A, L side lie to sit transfer req max A, sit to stand from eob or w/c req  max A and verbal cues for hand placement, and max A stand/squat-pivot transfer bed to w/c req max A. Pt ended up in w/c with daughter present and all needs in reach with breakfast tray in front of pt and encouragement to continue eating. Continue per PT POC.    PT Evaluation Precautions/Restrictions Precautions Precautions: Fall Precaution Comments: R side weakness Restrictions Weight Bearing Restrictions: No General Chart Reviewed: Yes Family/Caregiver Present: No  Vital SignsTherapy Vitals Pulse Rate: 61 BP: (!) 180/56 mmHg Patient Position (if appropriate): Sitting Oxygen Therapy SpO2: 98 % O2 Device: Not Delivered Pain Pain Assessment Pain Assessment: No/denies pain Home Living/Prior Functioning Home Living Living Arrangements: Children;Other relatives Available Help at Discharge: Family;Available 24 hours/day Type of Home: House Home Access: Stairs to enter (no ramp) Entrance Stairs-Number of Steps: 5-6 Entrance Stairs-Rails: Left Home Layout: One level;Able to live on main level with bedroom/bathroom Bathroom Shower/Tub: Other (comment) (walk-in tub) Bathroom Toilet: Handicapped height (elevated with BSC) Bathroom Accessibility: Yes Additional Comments: history taken from chart and patient - no family present to corroborate  Lives With: Daughter;Other (Comment) (SIL, grandson, grandaughter-in-law, and their new baby) Prior Function Level of Independence: Requires assistive device for independence Driving: No Vocation: Retired Comments: uses depends at night Vision/Perception  Retail buyer Comments: possible motor apraxia in R UE - to be further examined  Cognition Overall Cognitive Status: History of cognitive impairments - at baseline (daughter reports aricept improved pt's cognition, but she has slight sundowning) Arousal/Alertness:  Awake/alert Orientation Level: Oriented to person;Disoriented to place;Disoriented to situation Attention: Focused Focused Attention: Appears intact Memory: Impaired Memory Impairment: Decreased recall of new information Awareness: Impaired Awareness Impairment: Intellectual impairment Problem Solving: Impaired Problem Solving Impairment: Functional basic Executive Function: Initiating Initiating: Impaired Initiating Impairment: Functional basic Safety/Judgment: Impaired Sensation Sensation Light Touch: Appears Intact Stereognosis: Not tested Hot/Cold: Not tested Coordination Gross Motor Movements are Fluid and Coordinated: No Fine Motor Movements are Fluid and Coordinated: Not tested Coordination and Movement Description: poor initiation, slowed movements, decrease control in R UE Motor  Motor Motor: Hemiplegia;Abnormal tone;Abnormal postural alignment and control Motor - Skilled Clinical Observations: R side weakness, slow motor actions  Mobility Bed Mobility Bed Mobility: Rolling Right;Rolling Left;Left Sidelying to Sit Rolling Right: 2: Max assist Rolling Right Details: Manual facilitation for placement;Verbal cues for technique;Verbal cues for sequencing Rolling Left: 2: Max assist Rolling Left Details: Manual facilitation for placement;Verbal cues for technique;Verbal cues for sequencing Left Sidelying to Sit: 2: Max assist Left Sidelying to Sit Details: Manual facilitation for placement;Verbal cues for technique;Verbal cues for sequencing Transfers Transfers: Yes Sit to Stand: 2: Max assist;With upper extremity assist;From bed;From chair/3-in-1 Sit to Stand Details: Manual facilitation for placement;Verbal cues for technique;Verbal cues for sequencing Squat Pivot Transfers: 2: Max Risk manager Details: Manual facilitation for placement;Verbal cues for sequencing;Verbal cues for technique;Manual facilitation for weight shifting Locomotion   Ambulation Ambulation: Yes Ambulation/Gait Assistance: 1: +2 Total assist Ambulation Distance (Feet): 10 Feet Assistive device: Rolling walker Ambulation/Gait Assistance Details: Manual facilitation for placement;Verbal cues for safe use of DME/AE;Verbal cues for technique;Verbal cues for sequencing;Verbal cues for gait pattern;Manual facilitation for weight shifting Ambulation/Gait Assistance Details: strong retropulsive lean that decreases with initiation of stepping Gait Gait: Yes Gait Pattern: Impaired Gait Pattern: Shuffle;Step-to pattern;Decreased step length - left;Decreased step length - right;Decreased hip/knee flexion - right;Decreased hip/knee flexion - left;Decreased dorsiflexion - right;Decreased dorsiflexion - left;Trunk flexed;Poor foot clearance - left;Poor foot clearance - right Gait velocity: extremely slow Stairs / Additional Locomotion Stairs:  No Wheelchair Mobility Wheelchair Mobility: No  Trunk/Postural Assessment  Cervical Assessment Cervical Assessment: Exceptions to [facility] Cervical AROM Overall Cervical AROM: Deficits;Due to premorid status Overall Cervical AROM Comments: head postured onto forward head Thoracic Assessment Thoracic Assessment: Exceptions to [facility] Thoracic AROM Overall Thoracic AROM: Deficits;Due to premorid status Overall Thoracic AROM Comments: fixed kyphosis of spine Lumbar Assessment Lumbar Assessment: Exceptions to [facility] Lumbar AROM Overall Lumbar AROM: Deficits;Due to premorid status Overall Lumbar AROM Comments: flattened lumbar spine Postural Control Postural Control: Deficits on evaluation Trunk Control: retropulsive in sit & stand Righting Reactions: delayed - absent Postural Limitations: forward head with kyphotic posture and sacral sit  Balance Balance Balance Assessed: Yes Static Sitting Balance Static Sitting - Balance Support: Feet supported;Bilateral upper extremity supported Static Sitting - Level of Assistance: 4: Min  assist Dynamic Sitting Balance Dynamic Sitting - Balance Support: Bilateral upper extremity supported;Feet supported Dynamic Sitting - Level of Assistance: 3: Mod assist Static Standing Balance Static Standing - Balance Support: Bilateral upper extremity supported;During functional activity Static Standing - Level of Assistance: 2: Max assist Dynamic Standing Balance Dynamic Standing - Balance Support: Bilateral upper extremity supported;During functional activity Dynamic Standing - Level of Assistance: 2: Max assist Extremity Assessment  RUE Assessment RUE Assessment: Exceptions to [facility] RUE AROM (degrees) Overall AROM Right Upper Extremity: Deficits RUE Overall AROM Comments: arm flexion limited 25%, elbow and grip wfl RUE Strength RUE Overall Strength: Deficits RUE Overall Strength Comments: arm flexion 3-/5, elbow 4/5, grip 4/5 RUE Tone RUE Tone: Mild;Modified Ashworth Modified Ashworth Scale for Grading Hypertonia RUE: Slight increase in muscle tone, manifested by a catch and release or by minimal resistance at the end of the range of motion when the affected part(s) is moved in flexion or extension RUE Tone Comments: R elbow flexors LUE Assessment LUE Assessment: Within Functional Limits (grossly 4 to 4+/5 throughout - pt's baseline) RLE Assessment RLE Assessment: Exceptions to [facility] RLE AROM (degrees) Overall AROM Right Lower Extremity: Deficits;Due to decreased strength RLE Overall AROM Comments: hip flexion limited 50%; knee wfl; ankle DF limited 50% RLE Strength RLE Overall Strength: Deficits RLE Overall Strength Comments: hip flexion 3-/5, knee grossly 4/5, ankle DF 2+/5 LLE Assessment LLE Assessment: Within Functional Limits (grossly 4 to 4+/5)   See Function Navigator for Current Functional Status.   Refer to Care Plan for Long Term Goals  Recommendations for other services: None  Discharge Criteria: Patient will be discharged from PT if patient refuses treatment  3 consecutive times without medical reason, if treatment goals not met, if there is a change in medical status, if patient makes no progress towards goals or if patient is discharged from hospital.  The above assessment, treatment plan, treatment alternatives and goals were discussed and mutually agreed upon: by patient  [facility] M 05/19/2015, 12:28 PM

## 2015-05-19 NOTE — Progress Notes (Signed)
Social Work  Social Work Assessment and Plan  Patient Details  Name: Briana Thornton MRN: [id number] Date of Birth: 1926-12-19  Today's Date: 05/19/2015  Problem List:  Patient Active Problem List   Diagnosis Date Noted  . Hypoalbuminemia due to protein-calorie malnutrition ([place])   . Malnutrition of moderate degree 05/18/2015  . TIA (transient ischemic attack) 05/18/2015  . Left pontine cerebrovascular accident ([place]) 05/18/2015  . DM type 2 with diabetic peripheral neuropathy ([place]) 05/18/2015  . CAP (community acquired pneumonia) 03/05/2015  . SOB (shortness of breath) 03/05/2015  . Hypoxia 03/05/2015  . Weakness 03/05/2015  . Dehydration, mild 03/05/2015  . Controlled type 2 diabetes mellitus with diabetic nephropathy, without long-term current use of insulin ([place])   . Onychomycosis 10/31/2014  . UTI symptoms 01/25/2014  . Dysphagia 01/25/2014  . Abnormal finding on urinalysis 01/25/2014  . Dementia without behavioral disturbance 08/06/2013  . Postoperative anemia due to acute blood loss 07/26/2013  . Hip fracture ([place]) 07/24/2013  . Thyroid mass of unclear etiology 07/24/2013  . Fall at home 07/24/2013  . Cerumen impaction 04/21/2013  . Cognitive decline 02/15/2013  . Anemia 05/22/2012  . Depression 05/22/2012  . Dermatitis 05/22/2012  . Broken wrist   . Pacemaker-St.Jude 09/18/2011  . URI (upper respiratory infection) 08/31/2011  . Lymph node enlargement 08/31/2011  . Weight loss 07/31/2011  . Cardiovascular system disease 07/02/2011  . Hemorrhoid 07/02/2011  . Osteoporosis 07/02/2011  . Essential hypertension   . Hyperlipidemia   . IBS (irritable bowel syndrome)   . DM (diabetes mellitus) ([place])   . Calcification of cartilage   . BPPV (benign paroxysmal positional vertigo)   . Vision loss of right eye    Past Medical History:  Past Medical History  Diagnosis Date  . Chicken pox as a child  . Measles as a child  . IBS (irritable bowel syndrome)   . Diabetes  mellitus 47    type 2  . Hyperlipidemia 72  . Hypertension 72  . Vision loss of right eye   . Vertigo 2010    benign  . Calcification of cartilage     ear  . Cardiovascular system disease 07/02/2011  . Hemorrhoid 07/02/2011  . Osteoporosis 07/02/2011  . Weight loss 07/31/2011  . Anemia 05/22/2012  . Broken wrist 10-22-11    left  . Depression 05/22/2012  . Dermatitis 05/22/2012    Right neck  . DM type 2 with diabetic peripheral neuropathy ([place]) 05/18/2015   Past Surgical History:  Past Surgical History  Procedure Laterality Date  . Hemorroidectomy  1979  . Knee scoped  2002    right  . Rotator cuff repair  2004    right  . Abdominal hysterectomy  2008    partial still has ovaries  . Lens implant left  2006  . Pacemaker insertion  2010  . Wrist surgery  11-05-11    left wrist  . Intramedullary (im) nail intertrochanteric Right 07/24/2013    Procedure: INTRAMEDULLARY (IM) NAIL INTERTROCHANTRIC;  Surgeon: Gearlean Alf, MD;  Location: [facility];  Service: Orthopedics;  Laterality: Right;   Social History:  reports that she has never smoked. She has never used smokeless tobacco. She reports that she does not drink alcohol or use illicit drugs.  Family / Support Systems Marital Status: Widow/Widower Patient Roles: Parent Children: Briana Thornton-daughter [id number] [facility] Other Supports: Roger-son in-law [phone]-cell Anticipated Caregiver: daughter and son in-law, her grandson and grandaughter in-law may help some due to staying with parents  for short time. 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  They converted their garage into a bedroom for pt. Pt was recently at [facility] to recover from pnuemonia and was ambulating with a cane at home before this hospitalization  Social History Preferred  language: English Religion: Baptist Cultural Background: No issues Education: [employer] Read: Yes Write: Yes Employment Status: Retired Freight forwarder Issues: none Guardian/Conservator: None-according to MD pt is not capable of making her own decisions while here, will look toward her daughter since next of kin.    Abuse/Neglect Physical Abuse: Denies Verbal Abuse: Denies Sexual Abuse: Denies Exploitation of patient/patient's resources: Denies Self-Neglect: Denies  Emotional Status Pt's affect, behavior adn adjustment status: Pt is motivated to improve and move better while here, she doesn;t want to hurt those little therapists. She is willing to try and participate in therapies. Daughter would like for pt to be able to ambulate if it is a realistic goal. She is here to observe pt in therapies today. Recent Psychosocial Issues: other health issues-dementia and recent pnuemonia Pyschiatric History: history of depression but not on any medicines for at this time. Deferred depression screen due to pt appears to be coping appropriately, participating in therapies and do well. Substance Abuse History: no issues  Patient / Family Perceptions, Expectations & Goals Pt/Family understanding of illness & functional limitations: Pt has been told she had a stroke, her daughter is a Therapist, sports at [employer] and has a good understanding of her stroke and deficits. She is hopeful her Mom will make progress here and be able to return home. She does talk with the MD regarding questions or concerns. Premorbid pt/family roles/activities: Mom, Geophysicist/field seismologist, retiree, church member, etc Anticipated changes in roles/activities/participation: resume Pt/family expectations/goals: Pt states: " I will do what they ask, I'll try."  Daughter states: " I hope she will be moving better so we can manage her at home."  [employer]: Other (Comment) ([place] [facility] month ago) Premorbid Home  Care/DME Agencies: Other (Comment) (had hh in the past) Transportation available at discharge: family Resource referrals recommended: Support group (specify)  Discharge Planning Living Arrangements: Children, Other relatives Support Systems: Children, Other relatives, Friends/neighbors, Church/faith community Type of Residence: Private residence Insurance Resources: [employer], Multimedia programmer (specify) Nurse, mental health) Financial Resources: Fish farm manager, Family Support Financial Screen Referred: No Living Expenses: Lives with family Money Management: Family Does the patient have any problems obtaining your medications?: No Home Management: Daughter and son in-law  Patient/Family Preliminary Plans: Return home with daughter and son in-law who were providing 24 hr care prior to admission, just need her to be able to move better.  Son in-law works from home so there is always someone available. Await team's evaluations Social Work Anticipated Follow Up Needs: HH/OP, SNF, Support Group  Clinical Impression Pleasantly confused patient who is willing to work hard in therapies and give it her all. She does get scared about falling with the therapists, but daugther working on this with her. Very supportive family who have been providing Care to pt at home they adapted for her. Will await team's evaluations and see if pt reaches a level where she can be managed at home by daughter and son in-law. daughter plans to be here often to support Mom.  Elease Hashimoto 05/19/2015, 11:19 AM

## 2015-05-19 NOTE — Care Management Note (Signed)
[facility] Individual Statement of Services  Patient Name:  Briana Thornton  Date:  05/19/2015  Welcome to the [facility].  Our goal is to provide you with an individualized program based on your diagnosis and situation, designed to meet your specific needs.  With this comprehensive rehabilitation program, you will be expected to participate in at least 3 hours of rehabilitation therapies Monday-Friday, with modified therapy programming on the weekends.  Your rehabilitation program will include the following services:  Physical Therapy (PT), Occupational Therapy (OT), Speech Therapy (ST), 24 hour per day rehabilitation nursing, Therapeutic Recreaction (TR), Case Management (Social Worker), Rehabilitation Medicine, Nutrition Services and Pharmacy Services  Weekly team conferences will be held on Wednesday to discuss your progress.  Your Social Worker will talk with you frequently to get your input and to update you on team discussions.  Team conferences with you and your family in attendance may also be held.  Expected length of stay: 2-2.5 weeks  Overall anticipated outcome: min assist level  Depending on your progress and recovery, your program may change. Your Social Worker will coordinate services and will keep you informed of any changes. Your Social Worker's name and contact numbers are listed  below.  The following services may also be recommended but are not provided by the [facility]:   [place] will be made to provide these services after discharge if needed.  Arrangements include referral to agencies that provide these services.  Your insurance has been verified to be:  Medicare & [place] Your primary doctor is:  Penni Homans  Pertinent information will be shared with your doctor and your insurance company.  Social Worker:  Ovidio Kin, [place] or ([phone]  Information discussed with and copy given to patient by: Elease Hashimoto, 05/19/2015, 10:09 AM

## 2015-05-19 NOTE — Progress Notes (Signed)
Initial Nutrition Assessment  DOCUMENTATION CODES:   Non-severe (moderate) malnutrition in context of chronic illness  INTERVENTION:  Provide Glucerna Shake po BID, each supplement provides 220 kcal and 10 grams of protein.  Discontinue Beneprotein.  Encourage adequate PO intake.   NUTRITION DIAGNOSIS:   Malnutrition related to chronic illness as evidenced by moderate depletions of muscle mass, moderate depletion of body fat.  GOAL:   Patient will meet greater than or equal to 90% of their needs  MONITOR:   PO intake, Supplement acceptance, Weight trends, Labs, I & O's  REASON FOR ASSESSMENT:   Malnutrition Screening Tool    ASSESSMENT:   81 y.o. right handed female with history of hypertension, diabetes mellitus with peripheral neuropathy,CHB with pacemaker, dementia  Presented 05/16/2015 with right sided weakness 1 week. Cranial CT scan shows early left pontine infarct as well as remote basal ganglia infarcts bilaterally, remote left corona radiata infarct  Meal completion was ~50% this AM. Daughter at bedside reports pt does not consume a lot at meals, thus is agreeable to nutritional supplements for the patient to aid in caloric and protein needs. Daughter does report pt consumes Ensure at home at times. RD to order nutritional supplements. Per Epic weight records, weight has been stable. Pt was encouraged to eat her food at meals.   Noted, pt has Beneprotein ordered. RD to discontinue as pt reports she would like Glucerna Shake.   Nutrition-Focused physical exam completed. Findings are moderate fat depletion, moderate muscle depletion, and no edema.   Labs and medications reviewed.   Diet Order:  Diet regular Room service appropriate?: Yes; Fluid consistency:: Thin  Skin:  Reviewed, no issues  Last BM:  Unknown  Height:   Ht Readings from Last 1 Encounters:  05/18/15 5\' 2"  (1.575 m)    Weight:   Wt Readings from Last 1 Encounters:  05/18/15 110 lb 3.7 oz  (50 kg)    Ideal Body Weight:  50 kg  BMI:  Body mass index is 20.16 kg/(m^2).  Estimated Nutritional Needs:   Kcal:  1350-1550  Protein:  55-65 grams   Fluid:  >/= 1.5 L/day  EDUCATION NEEDS:   No education needs identified at this time  Corrin [facility], MS, RD, LDN Pager # [phone] After hours/ weekend pager # [phone]

## 2015-05-19 NOTE — Evaluation (Signed)
Speech Language Pathology Assessment and Plan  Patient Details  Name: Briana Thornton MRN: [id number] Date of Birth: July 02, 1926  SLP Diagnosis: N/A Rehab Potential: N/a ELOS: N/A    Today's Date: 05/19/2015 SLP Individual Time: 1430-1510 SLP Individual Time Calculation (min): 40 min   Problem List:  Patient Active Problem List   Diagnosis Date Noted  . Hypoalbuminemia due to protein-calorie malnutrition ([place])   . Malnutrition of moderate degree 05/18/2015  . TIA (transient ischemic attack) 05/18/2015  . Left pontine cerebrovascular accident ([place]) 05/18/2015  . DM type 2 with diabetic peripheral neuropathy ([place]) 05/18/2015  . CAP (community acquired pneumonia) 03/05/2015  . SOB (shortness of breath) 03/05/2015  . Hypoxia 03/05/2015  . Weakness 03/05/2015  . Dehydration, mild 03/05/2015  . Controlled type 2 diabetes mellitus with diabetic nephropathy, without long-term current use of insulin ([place])   . Onychomycosis 10/31/2014  . UTI symptoms 01/25/2014  . Dysphagia 01/25/2014  . Abnormal finding on urinalysis 01/25/2014  . Dementia without behavioral disturbance 08/06/2013  . Postoperative anemia due to acute blood loss 07/26/2013  . Hip fracture ([place]) 07/24/2013  . Thyroid mass of unclear etiology 07/24/2013  . Fall at home 07/24/2013  . Cerumen impaction 04/21/2013  . Cognitive decline 02/15/2013  . Anemia 05/22/2012  . Depression 05/22/2012  . Dermatitis 05/22/2012  . Broken wrist   . Pacemaker-[employer]ude 09/18/2011  . URI (upper respiratory infection) 08/31/2011  . Lymph node enlargement 08/31/2011  . Weight loss 07/31/2011  . Cardiovascular system disease 07/02/2011  . Hemorrhoid 07/02/2011  . Osteoporosis 07/02/2011  . Essential hypertension   . Hyperlipidemia   . IBS (irritable bowel syndrome)   . DM (diabetes mellitus) ([place])   . Calcification of cartilage   . BPPV (benign paroxysmal positional vertigo)   . Vision loss of right eye    Past Medical History:   Past Medical History  Diagnosis Date  . Chicken pox as a child  . Measles as a child  . IBS (irritable bowel syndrome)   . Diabetes mellitus 47    type 2  . Hyperlipidemia 72  . Hypertension 72  . Vision loss of right eye   . Vertigo 2010    benign  . Calcification of cartilage     ear  . Cardiovascular system disease 07/02/2011  . Hemorrhoid 07/02/2011  . Osteoporosis 07/02/2011  . Weight loss 07/31/2011  . Anemia 05/22/2012  . Broken wrist 10-22-11    left  . Depression 05/22/2012  . Dermatitis 05/22/2012    Right neck  . DM type 2 with diabetic peripheral neuropathy ([place]) 05/18/2015   Past Surgical History:  Past Surgical History  Procedure Laterality Date  . Hemorroidectomy  1979  . Knee scoped  2002    right  . Rotator cuff repair  2004    right  . Abdominal hysterectomy  2008    partial still has ovaries  . Lens implant left  2006  . Pacemaker insertion  2010  . Wrist surgery  11-05-11    left wrist  . Intramedullary (im) nail intertrochanteric Right 07/24/2013    Procedure: INTRAMEDULLARY (IM) NAIL INTERTROCHANTRIC;  Surgeon: Gearlean Alf, MD;  Location: [facility];  Service: Orthopedics;  Laterality: Right;    Assessment / Plan / Recommendation Clinical Impression Briana Thornton is a 81 y.o. right handed female with history of hypertension, diabetes mellitus with peripheral neuropathy,CHB with pacemaker, dementia maintained on Aricept. Patient lives with daughter who is a Marine scientist at [employer] and son-in-law.  Patient used a walker prior to admission. Family assistance is needed. Recently discharged from [facility] 3 weeks ago for pneumonia. Presented 05/16/2015 with right sided weakness 1 week. Cranial CT scan shows early left pontine infarct as well as remote basal ganglia infarcts bilaterally, remote left corona radiata infarct. MRI not completed due to pacemaker. Patient did not receive TPA. Echocardiogram CT angiogram of head and neck  with no large vessel occlusion or stenosis. Presently maintained on aspirin for CVA prophylaxis. Tolerating a regular consistency diet. Physical and occupational therapy evaluations completed 05/17/2015 with recommendations of physical medicine rehabilitation consult.  Patient transferred to CIR on 05/18/2015. Patient administered a cognitive-linguistic evaluation and demonstrates moderate cognitive deficits characterized by impaired orientation, recall and difficulty relaying specific information about current issues. However, suspect patient is at her cognitive baseline. Patient's daughter contacted via phone and agreed, therefore, skilled SLP intervention is not warranted at this time.   Skilled Therapeutic Interventions          Administered a cognitive-linguistic evaluation. Please see above for details.   SLP Assessment  Patient does not need any further Speech Lanaguage Pathology Services    Recommendations  Oral Care Recommendations: Oral care BID Patient destination: Home Follow up Recommendations: 24 hour supervision/assistance Equipment Recommended: None recommended by SLP    SLP Frequency N/A  SLP Treatment/Interventions N/A  Pain Pain Assessment Pain Assessment: No/denies pain  Function:   Cognition Comprehension Comprehension assist level: Understands basic 75 - 89% of the time/ requires cueing 10 - 24% of the time  Expression   Expression assist level: Expresses basic 75 - 89% of the time/requires cueing 10 - 24% of the time. Needs helper to occlude trach/needs to repeat words.  Social Interaction Social Interaction assist level: Interacts appropriately 75 - 89% of the time - Needs redirection for appropriate language or to initiate interaction.  Problem Solving Problem solving assist level: Solves basic 50 - 74% of the time/requires cueing 25 - 49% of the time  Memory Memory assist level: Recognizes or recalls 50 - 74% of the time/requires cueing 25 - 49% of the time    Short Term Goals: N/A   Refer to Care Plan for Long Term Goals  Recommendations for other services: None  Discharge Criteria: Patient will be discharged from SLP if patient refuses treatment 3 consecutive times without medical reason, if treatment goals not met, if there is a change in medical status, if patient makes no progress towards goals or if patient is discharged from hospital.  The above assessment, treatment plan, treatment alternatives and goals were discussed and mutually agreed upon: by patient and by family  Michelene Keniston 05/19/2015, 3:43 PM

## 2015-05-19 NOTE — Progress Notes (Signed)
[facility] PHYSICAL MEDICINE & REHABILITATION     PROGRESS NOTE    Subjective/Complaints: Pt resting comfortably in bed this AM.  She states she had a good night and is ready to begin therapies.    ROS: Denies CP, SOB, n/v/d.   Objective: Vital Signs: Blood pressure 180/56, pulse 61, temperature 97.5 F (36.4 C), temperature source Oral, resp. rate 16, height 5\' 2"  (1.575 m), weight 50 kg (110 lb 3.7 oz), SpO2 98 %. Ct Angio Head W/cm &/or Wo Cm  05/18/2015  CLINICAL DATA:  Weakness for several days, old compromise walking at baseline. Gait imbalance. Possible LEFT pontine infarct. History of hypertension, hyperlipidemia. EXAM: CT ANGIOGRAPHY HEAD AND NECK TECHNIQUE: Multidetector CT imaging of the head and neck was performed using the standard protocol during bolus administration of intravenous contrast. Multiplanar CT image reconstructions and MIPs were obtained to evaluate the vascular anatomy. Carotid stenosis measurements (when applicable) are obtained utilizing NASCET criteria, using the distal internal carotid diameter as the denominator. CONTRAST:  104mL OMNIPAQUE IOHEXOL 350 MG/ML SOLN 50 cc Omnipaque 350 COMPARISON:  CT head May 16, 2015 FINDINGS: CT HEAD The ventricles and sulci are normal for age. No intraparenchymal hemorrhage, mass effect nor midline shift. Confluent supratentorial white matter hypodensities. Subcentimeter LEFT pontine white matter hypodensity persists. No acute large vascular territory infarcts. Old small LEFT basal ganglia lacunar infarcts. No abnormal intracranial enhancement. No abnormal extra-axial fluid collections. 19 mm anterior cranial fossa meningioma with local mass effect. Basal cisterns are patent. Severe calcific atherosclerosis of the carotid siphons. No skull fracture. The included ocular globes and orbital contents are non-suspicious. Status post LEFT ocular lens implant. The mastoid aircells and included paranasal sinuses are well-aerated. Severe  bilateral temporomandibular osteoarthrosis. CTA NECK Aortic arch: Normal appearance of the thoracic arch, normal branch pattern. Moderate calcific atherosclerosis. The origins of the innominate, left Common carotid artery and subclavian artery are widely patent. Right carotid system: Common carotid artery is widely patent, coursing in a straight line fashion. Normal appearance of the carotid bifurcation without hemodynamically significant stenosis by NASCET criteria. Mild eccentric calcific atherosclerosis of the carotid bulb. Normal appearance of the included internal carotid artery. Left carotid system: Common carotid artery is widely patent, coursing in a straight line fashion. Normal appearance of the carotid bifurcation without hemodynamically significant stenosis by NASCET criteria. Mild eccentric calcific atherosclerosis of the carotid bulb. Normal appearance of the included internal carotid artery. Vertebral arteries:RIGHT vertebral artery is dominant. Normal appearance of the vertebral arteries, which appear widely patent. Skeleton: No acute osseous process though bone windows have not been submitted. Other neck: Soft tissues of the neck are nonacute though, not tailored for evaluation. 18 mm nodule isodense to thyroid contiguous with the RIGHT posterior lobe. CTA HEAD Anterior circulation: Normal appearance of the cervical internal carotid arteries, petrous, cavernous and supra clinoid internal carotid arteries. Widely patent anterior communicating artery. LEFT A1 segment is developmentally dominant. Patent anterior and middle cerebral arteries. Mild stenosis LEFT M2 segment. Posterior circulation: RIGHT vertebral artery is dominant with normal appearance of the vertebral arteries, vertebrobasilar junction and basilar artery, as well as main branch vessels. Moderate tandem stenosis bilateral posterior cerebral artery. No large vessel occlusion, hemodynamically significant stenosis, dissection, luminal  irregularity, contrast extravasation or aneurysm within the anterior nor posterior circulation. IMPRESSION: CT HEAD:  No acute intracranial process. Chronic changes including moderate to severe chronic small vessel ischemic disease, and LEFT pontine infarct. Stable 19 mm anterior cranial fossa meningioma. CTA NECK: No hemodynamically significant  stenosis or acute vascular process. 18 mm nodule within or contiguous with RIGHT posterior thyroid lobe, recommend thyroid sonogram on a nonemergent basis. CTA HEAD: No acute large vessel occlusion. 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 Mild stenosis LEFT M2 segment. Electronically Signed   By: Elon Alas M.D.   On: 05/18/2015 02:48   Ct Angio Neck W/cm &/or Wo/cm  05/18/2015  CLINICAL DATA:  Weakness for several days, old compromise walking at baseline. Gait imbalance. Possible LEFT pontine infarct. History of hypertension, hyperlipidemia. EXAM: CT ANGIOGRAPHY HEAD AND NECK TECHNIQUE: Multidetector CT imaging of the head and neck was performed using the standard protocol during bolus administration of intravenous contrast. Multiplanar CT image reconstructions and MIPs were obtained to evaluate the vascular anatomy. Carotid stenosis measurements (when applicable) are obtained utilizing NASCET criteria, using the distal internal carotid diameter as the denominator. CONTRAST:  64mL OMNIPAQUE IOHEXOL 350 MG/ML SOLN 50 cc Omnipaque 350 COMPARISON:  CT head May 16, 2015 FINDINGS: CT HEAD The ventricles and sulci are normal for age. No intraparenchymal hemorrhage, mass effect nor midline shift. Confluent supratentorial white matter hypodensities. Subcentimeter LEFT pontine white matter hypodensity persists. No acute large vascular territory infarcts. Old small LEFT basal ganglia lacunar infarcts. No abnormal intracranial enhancement. No abnormal extra-axial fluid collections. 19 mm anterior cranial fossa  meningioma with local mass effect. Basal cisterns are patent. Severe calcific atherosclerosis of the carotid siphons. No skull fracture. The included ocular globes and orbital contents are non-suspicious. Status post LEFT ocular lens implant. The mastoid aircells and included paranasal sinuses are well-aerated. Severe bilateral temporomandibular osteoarthrosis. CTA NECK Aortic arch: Normal appearance of the thoracic arch, normal branch pattern. Moderate calcific atherosclerosis. The origins of the innominate, left Common carotid artery and subclavian artery are widely patent. Right carotid system: Common carotid artery is widely patent, coursing in a straight line fashion. Normal appearance of the carotid bifurcation without hemodynamically significant stenosis by NASCET criteria. Mild eccentric calcific atherosclerosis of the carotid bulb. Normal appearance of the included internal carotid artery. Left carotid system: Common carotid artery is widely patent, coursing in a straight line fashion. Normal appearance of the carotid bifurcation without hemodynamically significant stenosis by NASCET criteria. Mild eccentric calcific atherosclerosis of the carotid bulb. Normal appearance of the included internal carotid artery. Vertebral arteries:RIGHT vertebral artery is dominant. Normal appearance of the vertebral arteries, which appear widely patent. Skeleton: No acute osseous process though bone windows have not been submitted. Other neck: Soft tissues of the neck are nonacute though, not tailored for evaluation. 18 mm nodule isodense to thyroid contiguous with the RIGHT posterior lobe. CTA HEAD Anterior circulation: Normal appearance of the cervical internal carotid arteries, petrous, cavernous and supra clinoid internal carotid arteries. Widely patent anterior communicating artery. LEFT A1 segment is developmentally dominant. Patent anterior and middle cerebral arteries. Mild stenosis LEFT M2 segment. Posterior  circulation: RIGHT vertebral artery is dominant with normal appearance of the vertebral arteries, vertebrobasilar junction and basilar artery, as well as main branch vessels. Moderate tandem stenosis bilateral posterior cerebral artery. No large vessel occlusion, hemodynamically significant stenosis, dissection, luminal irregularity, contrast extravasation or aneurysm within the anterior nor posterior circulation. IMPRESSION: CT HEAD:  No acute intracranial process. Chronic changes including moderate to severe chronic small vessel ischemic disease, and LEFT pontine infarct. Stable 19 mm anterior cranial fossa meningioma. CTA NECK: No hemodynamically significant stenosis or acute vascular process. 18 mm nodule within or contiguous with RIGHT posterior thyroid lobe, recommend thyroid sonogram on a  nonemergent basis. CTA HEAD: No acute large vessel occlusion. 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 Mild stenosis LEFT M2 segment. Electronically Signed   By: Elon Alas M.D.   On: 05/18/2015 02:48    Recent Labs  05/16/15 2210 05/19/15 0530  WBC 8.5 9.2  HGB 11.9* 11.4*  HCT 37.7 36.2  PLT 219 249    Recent Labs  05/16/15 2210 05/19/15 0530  NA 144 141  K 3.8 3.4*  CL 110 105  GLUCOSE 116* 87  BUN 19 15  CREATININE 0.90 0.84  CALCIUM 9.1 8.9   CBG (last 3)   Recent Labs  05/18/15 1133 05/18/15 1659 05/19/15 0648  GLUCAP 109* 128* 92    Wt Readings from Last 3 Encounters:  05/18/15 50 kg (110 lb 3.7 oz)  05/16/15 52.3 kg (115 lb 4.8 oz)  04/26/15 47.741 kg (105 lb 4 oz)    Physical Exam:  BP 180/56 mmHg  Pulse 61  Temp(Src) 97.5 F (36.4 C) (Oral)  Resp 16  Ht 5\' 2"  (1.575 m)  Wt 50 kg (110 lb 3.7 oz)  BMI 20.16 kg/m2  SpO2 98% Constitutional: NAD. Frail appearing. Vital signs reviewed.  HENT: Normocephalic, atraumatic Eyes: Conj and EOM normal.  Neck: Normal range of motion. No thyromegaly present.   Cardiovascular: Normal rate and no murmurs.  Respiratory: Effort normal and breath sounds normal. No respiratory distress.  GI: Soft. Bowel sounds are normal. She exhibits no distension.  Neurological: She is alert and oriented x2 with increased time and cues.  Makes good eye contact with examiner.  Follows commands. Motor: RUE 3+/5 proximal to distal.  RLE 2+/5 hip flexion, 3/5 ankle dorsi/plantar flexion.  LUE: 4-/5 proximal to distal LLE 2+ hip flexion, 3+ ankle dorsi/plantar flexion Skin: Skin is warm and dry.  Psychiatric:  Pleasant, cooperative, delayed, confused  Assessment/Plan: 1. Functional deficits secondary to left pontine infarct secondary to small vessel disease which require 3+ hours per day of interdisciplinary therapy in a comprehensive inpatient rehab setting. Physiatrist is providing close team supervision and 24 hour management of active medical problems listed below. Physiatrist and rehab team continue to assess barriers to discharge/monitor patient progress toward functional and medical goals.  Function:  Bathing Bathing position      Bathing parts      Bathing assist        Upper Body Dressing/Undressing Upper body dressing                    Upper body assist        Lower Body Dressing/Undressing Lower body dressing                                  Lower body assist        Toileting Toileting          Toileting assist     Transfers Chair/bed transfer             Locomotion Ambulation           Wheelchair          Cognition Comprehension Comprehension assist level: Understands basic 25 - 49% of the time/ requires cueing 50 - 75% of the time  Expression Expression assist level: Expresses basic 50 - 74% of the time/requires cueing 25 - 49% of the time. Needs to repeat parts of sentences.  Social Interaction Social Interaction assist  level: Interacts appropriately 75 - 89% of the time - Needs  redirection for appropriate language or to initiate interaction.  Problem Solving Problem solving assist level: Solves basic 50 - 74% of the time/requires cueing 25 - 49% of the time  Memory Memory assist level: Recognizes or recalls 25 - 49% of the time/requires cueing 50 - 75% of the time    Medical Problem List and Plan: 1. Right hemiparesis, gait instability secondary to left pontine infarct secondary to small vessel disease  Begin CIR 2. DVT Prophylaxis/Anticoagulation: SCDs. Monitor for any signs of DVT 3. Pain Management: Tylenol as needed 4. Mood/dementia: Aricept 10 mg daily. Discuss baseline cognition with family. Bed alarm for safety 5. Neuropsych: This patient is not capable of making decisions on her own behalf. 6. Skin/Wound Care: Routine skin checks 7. Fluids/Electrolytes/Nutrition: Routine I&O   Hypokalemia: 3.4 on 12/22. Will replace 8. Diabetes mellitus and peripheral neuropathy. Hemoglobin A1c 5.8. Sliding scale insulin. Check blood sugars before meals and at bedtime. Patient on Glucophage XR 500 mg daily prior to admission. Resume as tolerated  Fasting CBG 92 this AM 9. Hyperlipidemia. Lipitor 10. Anemia  Hb 11.2 on 12/22  Cont to monitor 11. HTN  Elevated BPs this AM, however, in light of recent stroke, will cont to monitor and consider medication initiation 12/26 12. Hypoalbuminemia:  Will start protein supplementation  LOS (Days) 1 A FACE TO FACE EVALUATION WAS PERFORMED  Chauntay Paszkiewicz Lorie Phenix 05/19/2015 9:38 AM

## 2015-05-19 NOTE — Evaluation (Signed)
Occupational Therapy Assessment and Plan  Patient Details  Name: Briana Thornton MRN: [id number] Date of Birth: 1927/01/02  OT Diagnosis: abnormal posture, cognitive deficits, disturbance of vision, hemiplegia affecting non-dominant side, muscular wasting and disuse atrophy and muscle weakness (generalized) Rehab Potential: Rehab Potential (ACUTE ONLY): Good ELOS: 12-14 days   Today's Date: 05/19/2015 OT Individual Time: 4098-1191 OT Individual Time Calculation (min): 86 min     Problem List:  Patient Active Problem List   Diagnosis Date Noted  . Hypoalbuminemia due to protein-calorie malnutrition ([place])   . Malnutrition of moderate degree 05/18/2015  . TIA (transient ischemic attack) 05/18/2015  . Left pontine cerebrovascular accident ([place]) 05/18/2015  . DM type 2 with diabetic peripheral neuropathy ([place]) 05/18/2015  . CAP (community acquired pneumonia) 03/05/2015  . SOB (shortness of breath) 03/05/2015  . Hypoxia 03/05/2015  . Weakness 03/05/2015  . Dehydration, mild 03/05/2015  . Controlled type 2 diabetes mellitus with diabetic nephropathy, without long-term current use of insulin ([place])   . Onychomycosis 10/31/2014  . UTI symptoms 01/25/2014  . Dysphagia 01/25/2014  . Abnormal finding on urinalysis 01/25/2014  . Dementia without behavioral disturbance 08/06/2013  . Postoperative anemia due to acute blood loss 07/26/2013  . Hip fracture ([place]) 07/24/2013  . Thyroid mass of unclear etiology 07/24/2013  . Fall at home 07/24/2013  . Cerumen impaction 04/21/2013  . Cognitive decline 02/15/2013  . Anemia 05/22/2012  . Depression 05/22/2012  . Dermatitis 05/22/2012  . Broken wrist   . Pacemaker-St.Jude 09/18/2011  . URI (upper respiratory infection) 08/31/2011  . Lymph node enlargement 08/31/2011  . Weight loss 07/31/2011  . Cardiovascular system disease 07/02/2011  . Hemorrhoid 07/02/2011  . Osteoporosis 07/02/2011  . Essential hypertension   . Hyperlipidemia   . IBS  (irritable bowel syndrome)   . DM (diabetes mellitus) ([place])   . Calcification of cartilage   . BPPV (benign paroxysmal positional vertigo)   . Vision loss of right eye     Past Medical History:  Past Medical History  Diagnosis Date  . Chicken pox as a child  . Measles as a child  . IBS (irritable bowel syndrome)   . Diabetes mellitus 47    type 2  . Hyperlipidemia 72  . Hypertension 72  . Vision loss of right eye   . Vertigo 2010    benign  . Calcification of cartilage     ear  . Cardiovascular system disease 07/02/2011  . Hemorrhoid 07/02/2011  . Osteoporosis 07/02/2011  . Weight loss 07/31/2011  . Anemia 05/22/2012  . Broken wrist 10-22-11    left  . Depression 05/22/2012  . Dermatitis 05/22/2012    Right neck  . DM type 2 with diabetic peripheral neuropathy ([place]) 05/18/2015   Past Surgical History:  Past Surgical History  Procedure Laterality Date  . Hemorroidectomy  1979  . Knee scoped  2002    right  . Rotator cuff repair  2004    right  . Abdominal hysterectomy  2008    partial still has ovaries  . Lens implant left  2006  . Pacemaker insertion  2010  . Wrist surgery  11-05-11    left wrist  . Intramedullary (im) nail intertrochanteric Right 07/24/2013    Procedure: INTRAMEDULLARY (IM) NAIL INTERTROCHANTRIC;  Surgeon: Gearlean Alf, MD;  Location: [facility];  Service: Orthopedics;  Laterality: Right;    Assessment & Plan Clinical Impression: Briana Thornton is a 81 y.o. right handed female with history of hypertension, diabetes mellitus  with peripheral neuropathy,CHB with pacemaker, dementia maintained on Aricept. Patient lives with daughter who is a Marine scientist at [employer] and son-in-law. Patient used a walker prior to admission. Family assistance is needed. Recently discharged from [facility] 3 weeks ago for pneumonia. Presented 05/16/2015 with right sided weakness 1 week. Cranial CT scan shows early left pontine infarct as well as  remote basal ganglia infarcts bilaterally, remote left corona radiata infarct.Marland Kitchen MRI not completed due to pacemaker. Patient did not receive TPA. Echocardiogram CT angiogram of head and neck with no large vessel occlusion or stenosis. Presently maintained on aspirin for CVA prophylaxis. Tolerating a regular consistency diet. Physical and occupational therapy evaluations completed 05/17/2015 with recommendations of physical medicine rehabilitation consult. Patient was admitted for a comprehensive rehabilitation program.  Patient transferred to CIR on 05/18/2015 .    Patient currently requires max with basic self-care skills secondary to muscle weakness, decreased cardiorespiratoy endurance, unbalanced muscle activation and decreased sitting balance, decreased standing balance, decreased postural control and decreased balance strategies.  Prior to hospitalization, patient could complete basic transfers with RW, assist needed for self care.  Patient will benefit from skilled intervention to increase independence with basic self-care skills prior to discharge home with care partner.  Anticipate patient will require minimal physical assistance and follow up home health.  OT - End of Session Endurance Deficit: Yes Endurance Deficit Description: cardiorespiratory OT Assessment Rehab Potential (ACUTE ONLY): Good OT Patient demonstrates impairments in the following area(s): Balance;Cognition;Endurance;Motor;Safety OT Basic ADL's Functional Problem(s): Eating;Grooming;Bathing;Dressing;Toileting OT Transfers Functional Problem(s): Toilet;Tub/Shower OT Additional Impairment(s): Fuctional Use of Upper Extremity OT Plan OT Intensity: Minimum of 1-2 x/day, 45 to 90 minutes OT Frequency: 5 out of 7 days OT Duration/Estimated Length of Stay: 12-14 days OT Treatment/Interventions: Balance/vestibular training;Cognitive remediation/compensation;Discharge planning;DME/adaptive equipment instruction;Functional mobility  training;Neuromuscular re-education;Patient/family education;Psychosocial support;Self Care/advanced ADL retraining;Therapeutic Activities;Therapeutic Exercise;UE/LE Strength taining/ROM;UE/LE Coordination activities OT Self Feeding Anticipated Outcome(s): set up OT Basic Self-Care Anticipated Outcome(s): min A OT Toileting Anticipated Outcome(s): min A OT Bathroom Transfers Anticipated Outcome(s): min A OT Recommendation Patient destination: Home Follow Up Recommendations: Home health OT Equipment Recommended: To be determined   Skilled Therapeutic Intervention Pt seen for initial evaluation and ADL retraining with a focus on activity tolerance and sit to stand. Pt has a severe kyphosis and the first several times of standing, she could not lift her chest and stood in a forward flexed position requiring max A.  Pt showered and dressed and then at end of session for the last stand to pull pants up she was able to push up from wc with mod A and looking in the mirror was able to stand more upright. This position allowed her to stand with only mod A.  Pt's daughter present and discussed with pt and dtr role of OT, pt's goals and OT POC. Pt resting in w/c at end of session with all needs met.  OT Evaluation Precautions/Restrictions  Precautions Precautions: Fall Precaution Comments: R side weakness Restrictions Weight Bearing Restrictions: No    Vital Signs Therapy Vitals Pulse Rate: 61 BP: (!) 180/56 mmHg Patient Position (if appropriate): Sitting Oxygen Therapy SpO2: 98 % O2 Device: Not Delivered Pain Pain Assessment Pain Assessment: No/denies pain Home Living/Prior Functioning Home Living Family/patient expects to be discharged to:: Unsure Living Arrangements: Children, Other relatives Available Help at Discharge: Family, Available 24 hours/day Type of Home: House Home Access: Stairs to enter (no ramp) Entrance Stairs-Number of Steps: 5-6 Entrance Stairs-Rails: Left Home  Layout: One level, Able to live on main level with bedroom/bathroom Bathroom Shower/Tub: Other (comment) (walk-in tub) Bathroom Toilet: Handicapped height (elevated with BSC) Bathroom Accessibility: Yes Additional Comments: history taken from chart and patient - no family present to corroborate  Lives With: Daughter, Other (Comment) (SIL, grandson, grandaughter-in-law, and their new baby) Prior Function Level of Independence: Needs assistance with ADLs, Requires assistive device for independence Driving: No Vocation: Retired Comments: uses depends at night ADL ADL ADL Comments: refer to functional navigator Vision/Perception  Vision- History Baseline Vision/History:  (Pt states she is blind in her R eye) Wears Glasses: Reading only Patient Visual Report: No change from baseline Vision- Assessment Vision Assessment?: Yes Eye Alignment: Within Functional Limits Ocular Range of Motion: Within Functional Limits Alignment/Gaze Preference: Within Defined Limits Tracking/Visual Pursuits: Decreased smoothness of horizontal tracking;Decreased smoothness of vertical tracking Saccades:  (not tested) Convergence:  (not tested) Visual Fields: No apparent deficits Praxis Praxis-Other Comments: WFL during basic self care of UB bathing and dressing  Cognition Overall Cognitive Status: History of cognitive impairments - at baseline Arousal/Alertness: Awake/alert Orientation Level: Person;Place;Situation Person: Oriented Place: Oriented Situation: Oriented Year:  (unable to recall, knew Obama was president) Month: December Day of Week: Correct Memory: Impaired Memory Impairment: Decreased recall of new information Immediate Memory Recall: Sock;Blue;Bed Memory Recall: Blue;Bed (unable to recall sock even with 2 cues) Memory Recall Blue: Without Cue Memory Recall Bed: Without Cue Attention: Focused Focused Attention: Appears intact Awareness: Impaired Awareness Impairment: Intellectual  impairment Problem Solving: Impaired Problem Solving Impairment: Functional basic Executive Function: Initiating Initiating: Impaired Initiating Impairment: Functional basic Safety/Judgment: Impaired Sensation Sensation Light Touch: Appears Intact Stereognosis: Appears Intact Hot/Cold: Appears Intact Proprioception: Impaired by gross assessment Additional Comments: impaired LE proprioception  Coordination Gross Motor Movements are Fluid and Coordinated: No Fine Motor Movements are Fluid and Coordinated: No Coordination and Movement Description: poor initiation, slowed movements, decrease control in R UE Motor  Motor Motor: Hemiplegia;Abnormal tone;Abnormal postural alignment and control Motor - Skilled Clinical Observations: R side weakness, slow motor actions Mobility  Bed Mobility Bed Mobility: Rolling Right;Rolling Left;Left Sidelying to Sit Rolling Right: 2: Max assist Rolling Right Details: Manual facilitation for placement;Verbal cues for technique;Verbal cues for sequencing Rolling Left: 2: Max assist Rolling Left Details: Manual facilitation for placement;Verbal cues for technique;Verbal cues for sequencing Left Sidelying to Sit: 2: Max assist Left Sidelying to Sit Details: Manual facilitation for placement;Verbal cues for technique;Verbal cues for sequencing Transfers Sit to Stand: 2: Max assist;With upper extremity assist;From bed;From chair/3-in-1 Sit to Stand Details: Manual facilitation for placement;Verbal cues for technique;Verbal cues for sequencing  Trunk/Postural Assessment  Cervical Assessment Cervical Assessment: Exceptions to [facility] Cervical AROM Overall Cervical AROM: Deficits;Due to premorid status Overall Cervical AROM Comments: head postured onto forward head Thoracic Assessment Thoracic Assessment: Exceptions to [facility] Thoracic AROM Overall Thoracic AROM: Deficits;Due to premorid status Overall Thoracic AROM Comments: fixed kyphosis of spine Lumbar  Assessment Lumbar Assessment: Exceptions to [facility] Lumbar AROM Overall Lumbar AROM: Deficits;Due to premorid status Overall Lumbar AROM Comments: flattened lumbar spine Postural Control Postural Control: Deficits on evaluation Trunk Control: retropulsive in sit & stand Righting Reactions: delayed - absent Postural Limitations: forward head with kyphotic posture and sacral sit  Balance Balance Balance Assessed: Yes Static Sitting Balance Static Sitting - Balance Support: Feet supported;Bilateral upper extremity supported Static Sitting - Level of Assistance: 4: Min assist Dynamic Sitting Balance Dynamic Sitting - Balance Support: Bilateral upper extremity supported;Feet supported Dynamic Sitting - Level of Assistance: 3: Mod  assist Static Standing Balance Static Standing - Balance Support: Bilateral upper extremity supported;During functional activity Static Standing - Level of Assistance: 2: Max assist Dynamic Standing Balance Dynamic Standing - Balance Support: Bilateral upper extremity supported;During functional activity Dynamic Standing - Level of Assistance: 2: Max assist Extremity/Trunk Assessment RUE Assessment RUE Assessment: Exceptions to [facility] RUE AROM (degrees) Overall AROM Right Upper Extremity: Deficits RUE Overall AROM Comments: arm flexion limited 25%, elbow and grip wfl RUE Strength RUE Overall Strength: Deficits RUE Overall Strength Comments: arm flexion 3-/5, elbow 4/5, grip 4/5 RUE Tone RUE Tone: Mild;Modified Ashworth Modified Ashworth Scale for Grading Hypertonia RUE: Slight increase in muscle tone, manifested by a catch and release or by minimal resistance at the end of the range of motion when the affected part(s) is moved in flexion or extension RUE Tone Comments: R elbow flexors LUE Assessment LUE Assessment:  (4-/5 in elbow/ hand, shoulder ROM limited to 80 degrees)   See Function Navigator for Current Functional Status.   Refer to Care Plan for Long  Term Goals  Recommendations for other services: None  Discharge Criteria: Patient will be discharged from OT if patient refuses treatment 3 consecutive times without medical reason, if treatment goals not met, if there is a change in medical status, if patient makes no progress towards goals or if patient is discharged from hospital.  The above assessment, treatment plan, treatment alternatives and goals were discussed and mutually agreed upon: by patient and by family  SAGUIER,JULIA 05/19/2015, 12:39 PM

## 2015-05-19 NOTE — Progress Notes (Signed)
Patient admitted by day shift nurse [phone].  This RN unable to fully complete admission due to patient's orientation. Will continue to monitor patient.

## 2015-05-19 NOTE — IPOC Note (Signed)
Overall Plan of Care [facility]) Patient Details Name: Briana Thornton MRN: [id number] DOB: 10-26-1926  Admitting Diagnosis: Left Pontine CVA   Hospital Problems: Active Problems:   Essential hypertension   Hyperlipidemia   Pacemaker-St.Jude   Dementia without behavioral disturbance   Controlled type 2 diabetes mellitus with diabetic nephropathy, without long-term current use of insulin (HCC)   Malnutrition of moderate degree   Left pontine cerebrovascular accident ([place])   Hypoalbuminemia due to protein-calorie malnutrition ([place])     Functional Problem List: Nursing Behavior, Bladder, Bowel, Endurance, Medication Management, Motor, Nutrition, Safety  PT Balance, Skin Integrity, Endurance, Motor, Nutrition, Pain, Safety, Sensory  OT Balance, Cognition, Endurance, Motor, Safety  SLP    TR         Basic ADL's: OT Eating, Grooming, Bathing, Dressing, Toileting     Advanced  ADL's: OT       Transfers: PT Bed Mobility, Bed to Chair, Car, Manufacturing systems engineer, Metallurgist: PT Ambulation, Emergency planning/management officer, Stairs     Additional Impairments: OT Fuctional Use of Upper Extremity  SLP        TR      Anticipated Outcomes Item Anticipated Outcome  Self Feeding set up  Swallowing      Basic self-care  min A  Toileting  min A   Bathroom Transfers min A  Bowel/Bladder  Patient will be able to call for assistance with bowel/bladder.  Transfers  min A  Locomotion  min A short distance gait; min A short distance w/c  Communication     Cognition     Pain  < 3 on 0-10 pain scale.   Safety/Judgment  Patient will remain free from falls while on Rehab.    Therapy Plan: PT Intensity: Minimum of 1-2 x/day ,45 to 90 minutes PT Frequency: 5 out of 7 days PT Duration Estimated Length of Stay: 2 to 2.5 weeks OT Intensity: Minimum of 1-2 x/day, 45 to 90 minutes OT Frequency: 5 out of 7 days OT Duration/Estimated Length of Stay: 12-14 days         Team  Interventions: Nursing Interventions Patient/Family Education, Bladder Management, Bowel Management, Pain Management, Medication Management, Skin Care/Wound Management, Discharge Planning, Cognitive Remediation/Compensation  PT interventions Ambulation/gait training, Discharge planning, Functional mobility training, Psychosocial support, Therapeutic Activities, Balance/vestibular training, Disease management/prevention, Neuromuscular re-education, Skin care/wound management, Therapeutic Exercise, Wheelchair propulsion/positioning, Cognitive remediation/compensation, DME/adaptive equipment instruction, Pain management, Splinting/orthotics, UE/LE Strength taining/ROM, Patient/family education, Stair training, UE/LE Coordination activities  OT Interventions Balance/vestibular training, Cognitive remediation/compensation, Discharge planning, DME/adaptive equipment instruction, Functional mobility training, Neuromuscular re-education, Patient/family education, Psychosocial support, Self Care/advanced ADL retraining, Therapeutic Activities, Therapeutic Exercise, UE/LE Strength taining/ROM, UE/LE Coordination activities  SLP Interventions    TR Interventions    SW/CM Interventions Discharge Planning, Psychosocial Support, Patient/Family Education    Team Discharge Planning: Destination: PT-Home ,OT- Home , SLP-Home Projected Follow-up: PT-24 hour supervision/assistance, Home health PT, OT-  Home health OT, SLP-24 hour supervision/assistance Projected Equipment Needs: PT-To be determined, OT- To be determined, SLP-None recommended by SLP Equipment Details: PT- , OT-  Patient/family involved in discharge planning: PT- Patient, Family member/caregiver,  OT-Patient, Family member/caregiver, SLP-Patient, Family member/caregiver  MD ELOS: 14-17 days Medical Rehab Prognosis:  Good Assessment: 81 y.o. right handed female with history of hypertension, diabetes mellitus with peripheral neuropathy,CHB with  pacemaker, dementia maintained on Aricept. Patient lives with daughter who is a Marine scientist at [employer] and son-in-law. Patient used a walker prior to admission. Presented  05/16/2015 with right sided weakness 1 week. Cranial CT scan shows early left pontine infarct as well as remote basal ganglia infarcts bilaterally, remote left corona radiata infarct..  Maintained on aspirin for CVA prophylaxis. Tolerating a regular consistency diet.   See Team Conference Notes for weekly updates to the plan of care

## 2015-05-20 ENCOUNTER — Inpatient Hospital Stay (HOSPITAL_COMMUNITY): Payer: Medicare Other | Admitting: Physical Therapy

## 2015-05-20 ENCOUNTER — Inpatient Hospital Stay (HOSPITAL_COMMUNITY): Payer: Medicare Other | Admitting: Occupational Therapy

## 2015-05-20 ENCOUNTER — Inpatient Hospital Stay (HOSPITAL_COMMUNITY): Payer: Medicare Other | Admitting: Speech Pathology

## 2015-05-20 ENCOUNTER — Inpatient Hospital Stay (HOSPITAL_COMMUNITY): Payer: Medicare Other

## 2015-05-20 LAB — GLUCOSE, CAPILLARY
GLUCOSE-CAPILLARY: 119 mg/dL — AB (ref 65–99)
GLUCOSE-CAPILLARY: 82 mg/dL (ref 65–99)
GLUCOSE-CAPILLARY: 120 mg/dL — AB (ref 65–99)
Glucose-Capillary: 142 mg/dL — ABNORMAL HIGH (ref 65–99)
Glucose-Capillary: 87 mg/dL (ref 65–99)

## 2015-05-20 NOTE — Progress Notes (Signed)
Physical Therapy Session Note  Patient Details  Name: Briana Thornton MRN: [id number] Date of Birth: Jul 19, 1926  Today's Date: 05/20/2015 PT Individual Time: 1583-0940 PT Individual Time Calculation (min): 39 min   Short Term Goals: Week 1:  PT Short Term Goal 1 (Week 1): Pt will initiate w/c propulsion during pt session.  PT Short Term Goal 2 (Week 1): Pt will demonstrate sit to stand req mod A.  PT Short Term Goal 3 (Week 1): Pt will transfer bed to/from w/c req mod A.  PT Short Term Goal 4 (Week 1): Pt will ambulate x 15' with PT.  PT Short Term Goal 5 (Week 1): Pt will initiate stair training during PT session.   Skilled Therapeutic Interventions/Progress Updates:    Pt received sleeping in recliner, easily awakes to voice, and agreeable to therapy session.  Session focus on safe transfers from a variety of surfaces, standing balance, bed mobility, and patient education.  Sit>stand from recliner with mod assist to rise and pt amb to bathroom with steady assist for safety.  Verbal cues provided for negotiation of incline into bathroom from bedroom and for turning around and backing up to toilet before attempting clothing management.  Dynamic standing balance with no UE support for clothing management and steady assist for stand>sit using RW and grab bar for support.  Pt able to complete hygiene without assist from therapist.  Sit>stand using grab bar and RW with steady assist and dynamic standing balance with forward bend for clothing management with overall supervision and intermittent steady assist from PT.  Pt amb to sink and able to wash hands with set-up and verbal cues for walker placement.  Pt amb back to bed with steady assist and verbal cues for coming to the [facility] to sit.  Mod assist for sit>supine and pt able to scoot to middle of bed with supervision via bridging.  Pt positioned to comfort and left with call bell in reach and needs met.   Therapy Documentation Precautions:   Precautions Precautions: Fall Precaution Comments: R side weakness Restrictions Weight Bearing Restrictions: No Pain: Pain Assessment Pain Assessment: No/denies pain   See Function Navigator for Current Functional Status.   Therapy/Group: Individual Therapy  Earnest Conroy Penven-Crew 05/20/2015, 4:59 PM

## 2015-05-20 NOTE — Progress Notes (Signed)
Physical Therapy Session Note  Patient Details  Name: Briana Thornton MRN: [id number] Date of Birth: 1926-09-05  Today's Date: 05/20/2015 PT Individual Time: [id number] PT Individual Time Calculation (min): 45 min   Short Term Goals: Week 1:  PT Short Term Goal 1 (Week 1): Pt will initiate w/c propulsion during pt session.  PT Short Term Goal 2 (Week 1): Pt will demonstrate sit to stand req mod A.  PT Short Term Goal 3 (Week 1): Pt will transfer bed to/from w/c req mod A.  PT Short Term Goal 4 (Week 1): Pt will ambulate x 15' with PT.  PT Short Term Goal 5 (Week 1): Pt will initiate stair training during PT session.   Skilled Therapeutic Interventions/Progress Updates:    Session focused on stair negotiation, toilet transfers and standing balance, and w/c mobility. Pt requires significant amount of extra time for all mobility tasks but able to do with overall minimal to light moderate assistance if set up correctly and given time. Pt able to go up/down 8 (short) steps using bilateral rails during session. Cues throughout session for hand placement. Pt with significant kyphosis on L lateral flexion (not LOB but positioned). End of session set-up in recliner with pillows for support on L. All needs in reach.   Therapy Documentation Precautions:  Precautions Precautions: Fall Precaution Comments: R side weakness Restrictions Weight Bearing Restrictions: No   Pain:  Denies pain. Just reports fatigue    See Function Navigator for Current Functional Status.   Therapy/Group: Individual Therapy  Canary Brim Ivory Broad, PT, DPT  05/20/2015, 3:27 PM

## 2015-05-20 NOTE — Progress Notes (Signed)
Occupational Therapy Session Note  Patient Details  Name: Briana Thornton MRN: [id number] Date of Birth: 1926-09-27  Today's Date: 05/20/2015 OT Individual Time: 1000-1100 OT Individual Time Calculation (min): 60 min    Short Term Goals: Week 1:  OT Short Term Goal 1 (Week 1): Pt will be able to sit to stand to RW with mod A. OT Short Term Goal 2 (Week 1): Pt will complete stand pivot to toilet with mod A. OT Short Term Goal 3 (Week 1): Pt will bathe in shower with min A.  Skilled Therapeutic Interventions/Progress Updates:    1:1 self care retraining at shower level. Focus on bed mobility to come to EOB. Pt required max A to come to EOB due amt of time needed. Pt transferred stand pivot bed to w/c with mod A with more than reasonable amt of time. Pt able to transfer to toilet with steadying A with pulling up on the grab bar and stepping over to sit on the toilet. Pt able to void. Pt ambulated short distance with RW from toilet to shower with min A with A to steer RW. Pt demonstrated muscle impersistance with short ambulation. Pt able to bathe with mod A with more than reasonable amt of time. Transferred back into w/c stand pivot with grab bar with min A. Pt dressed sitting in w/c. Pt with a shortened trunk on the left with sitting balance but able to correct. Pt continues to require more than reasonable amt of time to complete all tasks. Pt ambulated with RW (sit to stand with mod A with right hand on w/c and left on RW) with min A to recliner with extra time.  Left to rest in recliner.    Therapy Documentation Precautions:  Precautions Precautions: Fall Precaution Comments: R side weakness Restrictions Weight Bearing Restrictions: No Pain:  no c/o pain  But did complain she was not feeling great.  Pt with strong urine odor today. Rn made aware.  ADL: ADL ADL Comments: refer to functional navigator  See Function Navigator for Current Functional Status.   Therapy/Group: Individual  Therapy  Willeen Cass [facility] 05/20/2015, 11:37 AM

## 2015-05-20 NOTE — Progress Notes (Signed)
[place] PHYSICAL MEDICINE & REHABILITATION     PROGRESS NOTE    Subjective/Complaints:  Pt seen this AM laying in bed.  She is alert but affect is flat.     ROS: Denies CP, SOB, n/v/d, however ?reliability  Objective: Vital Signs: Blood pressure 143/42, pulse 60, temperature 97.8 F (36.6 C), temperature source Axillary, resp. rate 16, height 5\' 2"  (1.575 m), weight 50 kg (110 lb 3.7 oz), SpO2 94 %. No results found.  Recent Labs  05/19/15 0530  WBC 9.2  HGB 11.4*  HCT 36.2  PLT 249    Recent Labs  05/19/15 0530  NA 141  K 3.4*  CL 105  GLUCOSE 87  BUN 15  CREATININE 0.84  CALCIUM 8.9   CBG (last 3)   Recent Labs  05/19/15 2208 05/20/15 0711 05/20/15 0747  GLUCAP 119* 82 87    Wt Readings from Last 3 Encounters:  05/18/15 50 kg (110 lb 3.7 oz)  05/16/15 52.3 kg (115 lb 4.8 oz)  04/26/15 47.741 kg (105 lb 4 oz)    Physical Exam:  BP 143/42 mmHg  Pulse 60  Temp(Src) 97.8 F (36.6 C) (Axillary)  Resp 16  Ht 5\' 2"  (1.575 m)  Wt 50 kg (110 lb 3.7 oz)  BMI 20.16 kg/m2  SpO2 94% Constitutional: NAD. Frail appearing. Vital signs reviewed.  HENT: Normocephalic, atraumatic Eyes: Conj and EOM normal.  Neck: Normal range of motion. No thyromegaly present.  Cardiovascular: Normal rate and no murmurs.  Respiratory: Effort normal and breath sounds normal. No respiratory distress.  GI: Soft. Bowel sounds are normal. She exhibits no distension.  Neurological: She is alert and oriented x1.  Makes good eye contact with examiner.  Follows commands. Motor: RUE 3/5 proximal to distal (poor initiation)  RLE 2/5 proximal to distal (poor initiation) LUE: 3/5 proximal to distal (poor initiation) LLE: 2/5 proximal to distal (poor initiation) Skin: Skin is warm and dry.  Psychiatric:  Pleasant, cooperative, delayed, confused  Assessment/Plan: 1. Functional deficits secondary to left pontine infarct secondary to small vessel disease which require 3+  hours per day of interdisciplinary therapy in a comprehensive inpatient rehab setting. Physiatrist is providing close team supervision and 24 hour management of active medical problems listed below. Physiatrist and rehab team continue to assess barriers to discharge/monitor patient progress toward functional and medical goals.  Function:  Bathing Bathing position   Position: Shower  Bathing parts Body parts bathed by patient: Right arm, Left arm, Chest, Front perineal area, Abdomen, Right upper leg, Left upper leg Body parts bathed by helper: Right lower leg, Left lower leg, Back, Buttocks  Bathing assist Assist Level: Touching or steadying assistance(Pt > 75%)      Upper Body Dressing/Undressing Upper body dressing   What is the patient wearing?: Pull over shirt/dress     Pull over shirt/dress - Perfomed by patient: Thread/unthread right sleeve, Thread/unthread left sleeve Pull over shirt/dress - Perfomed by helper: Put head through opening, Pull shirt over trunk        Upper body assist Assist Level: Touching or steadying assistance(Pt > 75%)      Lower Body Dressing/Undressing Lower body dressing   What is the patient wearing?: Pants, Socks, Shoes (brief- total A)     Pants- Performed by patient: Thread/unthread right pants leg, Thread/unthread left pants leg Pants- Performed by helper: Pull pants up/down     Socks - Performed by patient: Don/doff right sock, Don/doff left sock   Shoes - Performed by  patient: Don/doff right shoe, Don/doff left shoe, Fasten right, Fasten left Shoes - Performed by helper: Don/doff right shoe, Don/doff left shoe, Fasten right, Fasten left          Lower body assist Assist for lower body dressing: Touching or steadying assistance (Pt > 75%)      Toileting Toileting   Toileting steps completed by patient: Performs perineal hygiene (2/2 steps doffed all clothing) Toileting steps completed by helper: Adjust clothing prior to  toileting Toileting Assistive Devices: Grab bar or rail  Toileting assist Assist level: Touching or steadying assistance (Pt.75%)   Transfers Chair/bed transfer   Chair/bed transfer method: Stand pivot Chair/bed transfer assist level: Moderate assist (Pt 50 - 74%/lift or lower) Chair/bed transfer assistive device: Armrests     Locomotion Ambulation     Max distance: 10' Assist level: 2 helpers   Wheelchair   Type: Manual      Cognition Comprehension Comprehension assist level: Understands basic 75 - 89% of the time/ requires cueing 10 - 24% of the time  Expression Expression assist level: Expresses basic 75 - 89% of the time/requires cueing 10 - 24% of the time. Needs helper to occlude trach/needs to repeat words.  Social Interaction Social Interaction assist level: Interacts appropriately 75 - 89% of the time - Needs redirection for appropriate language or to initiate interaction.  Problem Solving Problem solving assist level: Solves basic 50 - 74% of the time/requires cueing 25 - 49% of the time  Memory Memory assist level: Recognizes or recalls 50 - 74% of the time/requires cueing 25 - 49% of the time    Medical Problem List and Plan: 1. Right hemiparesis, gait instability secondary to left pontine infarct secondary to small vessel disease  Cont CIR 2. DVT Prophylaxis/Anticoagulation: SCDs. Monitor for any signs of DVT 3. Pain Management: Tylenol as needed 4. Mood/dementia: Aricept 10 mg daily. Discuss baseline cognition with family. Bed alarm for safety 5. Neuropsych: This patient is not capable of making decisions on her own behalf. 6. Skin/Wound Care: Routine skin checks 7. Fluids/Electrolytes/Nutrition: Routine I&O   Hypokalemia: 3.4 on 12/22. Replaced 8. Diabetes mellitus and peripheral neuropathy. Hemoglobin A1c 5.8. Sliding scale insulin. Check blood sugars before meals and at bedtime. Patient on Glucophage XR 500 mg daily prior to admission. Resume as  tolerated  Fasting CBG 87 this AM 9. Hyperlipidemia. Lipitor 10. Anemia  Hb 11.2 on 12/22  Cont to monitor 11. HTN  Slightly elevated at 143/42 this AM, in light of recent stroke, will cont to monitor and consider medication initiation 12/26 12. Hypoalbuminemia:  Protein supplementation initiated   LOS (Days) 2 A FACE TO FACE EVALUATION WAS PERFORMED  Ankit Lorie Phenix 05/20/2015 11:48 AM

## 2015-05-21 ENCOUNTER — Inpatient Hospital Stay (HOSPITAL_COMMUNITY): Payer: Medicare Other | Admitting: Occupational Therapy

## 2015-05-21 ENCOUNTER — Inpatient Hospital Stay (HOSPITAL_COMMUNITY): Payer: Medicare Other | Admitting: Physical Therapy

## 2015-05-21 NOTE — Progress Notes (Signed)
Physical Therapy Session Note  Patient Details  Name: Briana Thornton MRN: [id number] Date of Birth: [birth year]  Today's Date: 05/21/2015 PT Individual Time: 1430-1530 PT Individual Time Calculation (min): 60 min   Short Term Goals: Week 1:  PT Short Term Goal 1 (Week 1): Pt will initiate w/c propulsion during pt session.  PT Short Term Goal 2 (Week 1): Pt will demonstrate sit to stand req mod A.  PT Short Term Goal 3 (Week 1): Pt will transfer bed to/from w/c req mod A.  PT Short Term Goal 4 (Week 1): Pt will ambulate x 15' with PT.  PT Short Term Goal 5 (Week 1): Pt will initiate stair training during PT session.   Skilled Therapeutic Interventions/Progress Updates:    Pt received resting in recliner and agreeable to therapy session.  Session focus on ambulation with RW, dynamic balance, activity tolerance, NMR, and transfers.  Sit>stand from recliner with mod assist and verbal cues for forward weight shift and hand placement.  Pt amb to bathroom with RW and steady assist with occasional verbal cues for taking a step with LLE.  Pt performed toilet transfer and tasks with overall steady assist using grab bar and RW.  Dynamic standing balance for clothing management and hand washing with steady assist.  PT transported pt to therapy gym in w/c total assist for energy conservation and time management.  PT instructed pt in Nustep x5 mins and x10 reps R/L at 90 cm/s for NMR, alternating step pattern, and overall endurance with 2 rest breaks.  PT instructed pt in 5x sit<>stand with verbal cues for forward weight shift, hand placement and posture.  Pt required mod assist to rise for each stand.  Pt propelled w/c x15' with UEs and LEs demos poor efficiency and reporting increased fatigue so PT propelled remaining distance to room.  Sit>stand from w/c and step transfer to recliner with RW and steady assist with verbal cues for sequencing.  Pt requires more than a reasonable amount of time to complete all  tasks during therapy session.  Pt left sitting in recliner with LEs elevated, call bell in reach, and needs met.   Therapy Documentation Precautions:  Precautions Precautions: Fall Precaution Comments: R side weakness Restrictions Weight Bearing Restrictions: No  Pain: Pain Assessment Pain Assessment: No/denies pain   See Function Navigator for Current Functional Status.   Therapy/Group: Individual Therapy  Earnest Conroy Penven-Crew 05/21/2015, 3:39 PM

## 2015-05-21 NOTE — Progress Notes (Signed)
Occupational Therapy Session Note  Patient Details  Name: Briana Thornton MRN: [id number] Date of Birth: 12/18/1926  Today's Date: 05/21/2015 OT Individual Time: 0900-1000 OT Individual Time Calculation (min): 60 min    Short Term Goals: Week 1:  OT Short Term Goal 1 (Week 1): Pt will be able to sit to stand to RW with mod A. OT Short Term Goal 2 (Week 1): Pt will complete stand pivot to toilet with mod A. OT Short Term Goal 3 (Week 1): Pt will bathe in shower with min A.  Skilled Therapeutic Interventions/Progress Updates:    Pt seen for OT ADL session focusing on functional transfers and ADL re-training. Pt in supine upon arrival, agreeable to tx session. NT present to assist pt with changing of brief due to incontinence. Pt voiced needing to cont to go to bathroom. She transferred to EOB and Max A stand pivot completed to [facility]. Sitting on BSC, pt dressed with significantly increased time and min A for positioning of R LE when threading into pants and socks. Educated regarding hemi dressing techniques. She stood from [facility] with max A and able to complete clothing management within steadying assist- close supervision. She stood at [facility] and turned semi circle to sit in recliner with increased time and VCs for RW management and step position. Pt left sitting in recliner at end of session, NT made aware and bringing pt breakfast.  Pt requires increased time and encouragement to complete all tasks, however, demonstrates increased independence with basic ADL tasks.   Therapy Documentation Precautions:  Precautions Precautions: Fall Precaution Comments: R side weakness Restrictions Weight Bearing Restrictions: No Pain:   No/ denies pain ADL: ADL ADL Comments: refer to functional navigator  See Function Navigator for Current Functional Status.   Therapy/Group: Individual Therapy  Thornton, Briana Applegate C 05/21/2015, 6:41 AM

## 2015-05-21 NOTE — Progress Notes (Signed)
Physical Therapy Session Note  Patient Details  Name: Briana Thornton MRN: [id number] Date of Birth: 13-Mar-1927  Today's Date: 05/21/2015 PT Individual Time: 1045-1200 PT Individual Time Calculation (min): 75 min   Short Term Goals: Week 1:  PT Short Term Goal 1 (Week 1): Pt will initiate w/c propulsion during pt session.  PT Short Term Goal 2 (Week 1): Pt will demonstrate sit to stand req mod A.  PT Short Term Goal 3 (Week 1): Pt will transfer bed to/from w/c req mod A.  PT Short Term Goal 4 (Week 1): Pt will ambulate x 15' with PT.  PT Short Term Goal 5 (Week 1): Pt will initiate stair training during PT session.   Skilled Therapeutic Interventions/Progress Updates:    Pt received up in w/c with daughter present, but she left soon after PT session began. Therapeutic Activity - Pt brushes teeth in recliner with set up assist, demonstrates mod A for sit to stand with verbal cues for sequencing and hand placement with RW, then min A stand-step transfer recliner to bed. Pt req min A for L leg sit to supine, mod A to roll L in flat bed without rail - very slow movement, then mod A L side lie to sit transfer. Gait Training - PT instructs pt in ambulation with RW x 65' req min A - pt demonstrates a Parkinsonian gait with crouched posture, narrow/tandem BOS, difficulty initiating L Leg swing, then speeding up with gait speed, and then getting partially frozen, later unfreezing, and ambulating more. W/C Management - PT instructs pt in w/c propulsion with B UEs x 65' taking significant amount of time - small excursion of arm stroke despite PT cueing, and pt req min A for propulsion. Neuromuscular Reeducation - PT instructs pt in standing balance and posture reeducation activity - standing with RW support, reaching up to match playing cards placed high on vertical wall (mirror) - req up to min A for balance when pt becomes retropulsive - pt req mod - max cues for matching appropriate number/suit of cards  during this activity: x 2 sets.  Pt ended up in recliner in room with quick release belt in place, lunch tray in front of her. Pt is slowly making progress with activity tolerance, but has long standing habits of poor posture and poor technique (pulling up on objects to achieve standing) that will be difficult to remedy and compensation therapeutic approach may be taken in some situations in order to improve function and reduce burden of care. Continue per PT POC.   Therapy Documentation Precautions:  Precautions Precautions: Fall Precaution Comments: R side weakness Restrictions Weight Bearing Restrictions: No  Pain: Pain Assessment Pain Assessment: 0-10 Pain Score: 3  Pain Type: Acute pain Pain Location: Hip Pain Orientation: Left Pain Descriptors / Indicators: Sore Pain Intervention(s): Repositioned Multiple Pain Sites: No   See Function Navigator for Current Functional Status.   Therapy/Group: Individual Therapy  Sevon Rotert M 05/21/2015, 11:19 AM

## 2015-05-22 LAB — GLUCOSE, CAPILLARY
Glucose-Capillary: 81 mg/dL (ref 65–99)
Glucose-Capillary: 148 mg/dL — ABNORMAL HIGH (ref 65–99)
Glucose-Capillary: 149 mg/dL — ABNORMAL HIGH (ref 65–99)
Glucose-Capillary: 97 mg/dL (ref 65–99)

## 2015-05-22 NOTE — Progress Notes (Signed)
[place] PHYSICAL MEDICINE & REHABILITATION     PROGRESS NOTE    Subjective/Complaints:  No complaints. She is lying in bed. States therapy is going well.  Objective: Vital Signs: Blood pressure 167/62, pulse 68, temperature 97.8 F (36.6 C), temperature source Oral, resp. rate 16, height 5\' 2"  (1.575 m), weight 110 lb 3.7 oz (50 kg), SpO2 97 %.   Physical Exam:  BP 167/62 mmHg  Pulse 68  Temp(Src) 97.8 F (36.6 C) (Oral)  Resp 16  Ht 5\' 2"  (1.575 m)  Wt 110 lb 3.7 oz (50 kg)  BMI 20.16 kg/m2  SpO2 97%  Elderly female in no acute distress. She thinks tomorrow is Christmas. When she was told Christmas today she quickly responded that she was confused. HEENT exam atraumatic, normocephalic Neck supple Chest clear to auscultation Cardiac exam regular rate. She has a 2/6 systolic murmur.   abdominal exam: Thin, active bowel sounds, soft. Extremities no edema   Assessment/Plan: 1. Functional deficits secondary to left pontine infarct secondary to small vessel disease    Medical Problem List and Plan: 1. Right hemiparesis, gait instability secondary to left pontine infarct secondary to small vessel disease  Cont CIR 2. DVT Prophylaxis/Anticoagulation: SCDs. Monitor for any signs of DVT 3. Pain Management: Tylenol as needed 4. Mood/dementia: Aricept 10 mg daily.  5. Neuropsych: This patient is not capable of making decisions on her own behalf. 6. Skin/Wound Care: Routine skin checks 7. Fluids/Electrolytes/Nutrition: Routine I&O    Basic Metabolic Panel:    Component Value Date/Time   NA 141 05/19/2015 0530   K 3.4* 05/19/2015 0530   CL 105 05/19/2015 0530   CO2 26 05/19/2015 0530   BUN 15 05/19/2015 0530   CREATININE 0.84 05/19/2015 0530   CREATININE 1.07 01/06/2014 1100   GLUCOSE 87 05/19/2015 0530   CALCIUM 8.9 05/19/2015 0530    8. Diabetes mellitus and peripheral neuropathy. Hemoglobin A1c 5.8. Sliding scale insulin. Check blood sugars before meals and at  bedtime. Patient on Glucophage XR 500 mg daily prior to admission.  CBG (last 3)   Recent Labs  05/20/15 0747 05/20/15 1144 05/22/15 0657  GLUCAP 87 120* 81     9. Hyperlipidemia. Lipitor 10. Anemia   Lab Results  Component Value Date   HGB 11.4* 05/19/2015    11. HTN108/57-167/62 Blood pressure is variable. We'll continue to monitor.  12. Hypoalbuminemia:  Protein supplementation initiated   LOS (Days) 4 A FACE TO FACE EVALUATION WAS PERFORMED  SWORDS,BRUCE HENRY 05/22/2015 8:40 AM

## 2015-05-22 NOTE — Progress Notes (Signed)
Late entry When reviewing the chart on 1225 I realized I had not written note for December 24. This is a late entry.  Subjective: No complaints. She denies pain.  Objective: Blood pressure 143-182/42-61. Temperature 97.8, respiratory rate 16-17 pulse 60.  Exam: Elderly female in no acute distress. Neck supple without lymphadenopathy Chest clear to auscultation Cardiac exam S1 and S2 are regular Abdominal exam thin, active bowel sounds, soft Extremities no edema  Assessment and plan: Medical Problem List and Plan: 1. Right hemiparesis, gait instability secondary to left pontine infarct secondary to small vessel disease  2. DVT Prophylaxis/Anticoagulation: SCDs. Monitor for any signs of DVT 3. Pain Management: Tylenol as needed 4. Mood/dementia: Aricept 10 mg daily. y 5. Neuropsych: This patient is not capable of making decisions on her own behalf. 6. Skin/Wound Care: Routine skin checks 7. Fluids/Electrolytes/Nutrition: Routine I&O  Hypokalemia: 3.4 on 12/22. Replaced 8. Diabetes mellitus and peripheral neuropathy. Hemoglobin A1c 5.8.  Blood sugars have been well controlled. 9. Hyperlipidemia. Lipitor 10. Anemia Hb 11.2 on 12/22 Cont to monitor 11. HTN We'll continue to monitor. 12. Hypoalbuminemia: Protein supplementation initiated

## 2015-05-23 ENCOUNTER — Inpatient Hospital Stay (HOSPITAL_COMMUNITY): Payer: Medicare Other

## 2015-05-23 ENCOUNTER — Inpatient Hospital Stay (HOSPITAL_COMMUNITY): Payer: Medicare Other | Admitting: Occupational Therapy

## 2015-05-23 ENCOUNTER — Inpatient Hospital Stay (HOSPITAL_COMMUNITY): Payer: Medicare Other | Admitting: *Deleted

## 2015-05-23 DIAGNOSIS — R0989 Other specified symptoms and signs involving the circulatory and respiratory systems: Secondary | ICD-10-CM | POA: Insufficient documentation

## 2015-05-23 DIAGNOSIS — R030 Elevated blood-pressure reading, without diagnosis of hypertension: Secondary | ICD-10-CM

## 2015-05-23 LAB — GLUCOSE, CAPILLARY
GLUCOSE-CAPILLARY: 104 mg/dL — AB (ref 65–99)
GLUCOSE-CAPILLARY: 89 mg/dL (ref 65–99)
GLUCOSE-CAPILLARY: 154 mg/dL — AB (ref 65–99)
Glucose-Capillary: 102 mg/dL — ABNORMAL HIGH (ref 65–99)
Glucose-Capillary: 111 mg/dL — ABNORMAL HIGH (ref 65–99)
Glucose-Capillary: 113 mg/dL — ABNORMAL HIGH (ref 65–99)
Glucose-Capillary: 131 mg/dL — ABNORMAL HIGH (ref 65–99)
Glucose-Capillary: 83 mg/dL (ref 65–99)
Glucose-Capillary: 78 mg/dL (ref 65–99)
Glucose-Capillary: 201 mg/dL — ABNORMAL HIGH (ref 65–99)

## 2015-05-23 NOTE — Progress Notes (Signed)
Occupational Therapy Note  Patient Details  Name: Briana Thornton MRN: [id number] Date of Birth: 1927/04/07  Today's Date: 05/23/2015 OT Individual Time: 1300-1330 OT Individual Time Calculation (min): 30 min   Pt denied pain Individual Therapy  Pt engaged in sit<>stand, standing balance activities, and functional amb in room for simple tasks at sink.  Pt required steady A for sit<>stand and all standing tasks.  Pt required more than a reasonable amount of time to complete tasks with multiple rest breaks between segments.  Pt remained in w/c with all needs within reach.    Leotis Shames [facility] 05/23/2015, 3:05 PM

## 2015-05-23 NOTE — Progress Notes (Signed)
[place] PHYSICAL MEDICINE & REHABILITATION     PROGRESS NOTE    Subjective/Complaints:  Resting comfortably in bed. When asked how we can was, she states that she had a busy week because she was painting.  ROS: Denies CP, SOB, n/v/d, however ?reliability  Objective: Vital Signs: Blood pressure 159/56, pulse 60, temperature 98 F (36.7 C), temperature source Oral, resp. rate 18, height 5\' 2"  (1.575 m), weight 50 kg (110 lb 3.7 oz), SpO2 96 %. No results found. No results for input(s): WBC, HGB, HCT, PLT in the last 72 hours. No results for input(s): NA, K, CL, GLUCOSE, BUN, CREATININE, CALCIUM in the last 72 hours.  Invalid input(s): CO CBG (last 3)   Recent Labs  05/22/15 1626 05/22/15 2328 05/23/15 0646  GLUCAP 149* 97 83    Wt Readings from Last 3 Encounters:  05/18/15 50 kg (110 lb 3.7 oz)  05/16/15 52.3 kg (115 lb 4.8 oz)  04/26/15 47.741 kg (105 lb 4 oz)    Physical Exam:  BP 159/56 mmHg  Pulse 60  Temp(Src) 98 F (36.7 C) (Oral)  Resp 18  Ht 5\' 2"  (1.575 m)  Wt 50 kg (110 lb 3.7 oz)  BMI 20.16 kg/m2  SpO2 96% Constitutional: NAD. Frail appearing. Vital signs reviewed.  HENT: Normocephalic, atraumatic Eyes: Conj and EOM normal.  Neck: Normal range of motion. No thyromegaly present.  Cardiovascular: Normal rate and no murmurs.  Respiratory: Effort normal and breath sounds normal. No respiratory distress.  GI: Soft. Bowel sounds are normal. She exhibits no distension.  Neurological: She is alert and oriented x1.  Makes good eye contact with examiner.  Follows commands. Motor: A&Ox2 with increased time and cues RUE 3+/5 proximal to distal (poor initiation)  RLE 2/5 proximal to distal (poor initiation) LUE: 4/5 proximal to distal (poor initiation) LLE: 2/5 proximal to distal (poor initiation) Skin: Skin is warm and dry.  Psychiatric:  Pleasant, cooperative, delayed, confused  Assessment/Plan: 1. 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 Physiatrist is providing close team supervision and 24 hour management of active medical problems listed below. Physiatrist and rehab team continue to assess barriers to discharge/monitor patient progress toward functional and medical goals.  Function:  Bathing Bathing position   Position: Shower  Bathing parts Body parts bathed by patient: Right arm, Left arm, Chest, Front perineal area, Abdomen, Right upper leg, Left upper leg Body parts bathed by helper: Right lower leg, Left lower leg, Back, Buttocks  Bathing assist Assist Level: Touching or steadying assistance(Pt > 75%)      Upper Body Dressing/Undressing Upper body dressing   What is the patient wearing?: Pull over shirt/dress, Button up shirt     Pull over shirt/dress - Perfomed by patient: Thread/unthread right sleeve, Thread/unthread left sleeve, Put head through opening, Pull shirt over trunk Pull over shirt/dress - Perfomed by helper: Put head through opening, Pull shirt over trunk Button up shirt - Perfomed by patient: Thread/unthread left sleeve Button up shirt - Perfomed by helper: Thread/unthread right sleeve, Pull shirt around back    Upper body assist Assist Level: Set up   Set up : To obtain clothing/put away  Lower Body Dressing/Undressing Lower body dressing   What is the patient wearing?: Underwear, Pants, Socks, Shoes Underwear - Performed by patient: Thread/unthread left underwear leg, Pull underwear up/down Underwear - Performed by helper: Thread/unthread right underwear  leg Pants- Performed by patient: Thread/unthread right pants leg, Thread/unthread left pants leg, Pull pants up/down Pants- Performed by helper: Pull pants up/down     Socks - Performed by patient: Don/doff right sock, Don/doff left sock   Shoes - Performed by patient: Don/doff right  shoe, Don/doff left shoe, Fasten right, Fasten left Shoes - Performed by helper: Don/doff right shoe, Don/doff left shoe, Fasten right, Fasten left          Lower body assist Assist for lower body dressing: Touching or steadying assistance (Pt > 75%)      Toileting Toileting   Toileting steps completed by patient: Adjust clothing prior to toileting, Performs perineal hygiene, Adjust clothing after toileting Toileting steps completed by helper: Adjust clothing after toileting Toileting Assistive Devices: Grab bar or rail  Toileting assist Assist level: Touching or steadying assistance (Pt.75%)   Transfers Chair/bed transfer   Chair/bed transfer method: Stand pivot, Ambulatory Chair/bed transfer assist level: Touching or steadying assistance (Pt > 75%) Chair/bed transfer assistive device: Armrests, Medical sales representative     Max distance: 41' Assist level: Touching or steadying assistance (Pt > 75%)   Wheelchair   Type: Manual Max wheelchair distance: 87' Assist Level: Touching or steadying assistance (Pt > 75%)  Cognition Comprehension Comprehension assist level: Understands basic 25 - 49% of the time/ requires cueing 50 - 75% of the time  Expression Expression assist level: Expresses basic 50 - 74% of the time/requires cueing 25 - 49% of the time. Needs to repeat parts of sentences.  Social Interaction Social Interaction assist level: Interacts appropriately 75 - 89% of the time - Needs redirection for appropriate language or to initiate interaction.  Problem Solving Problem solving assist level: Solves basic 50 - 74% of the time/requires cueing 25 - 49% of the time  Memory Memory assist level: Recognizes or recalls 25 - 49% of the time/requires cueing 50 - 75% of the time    Medical Problem List and Plan: 1. Right hemiparesis, gait instability secondary to left pontine infarct secondary to small vessel disease  Cont CIR 2. DVT Prophylaxis/Anticoagulation:  SCDs. Monitor for any signs of DVT 3. Pain Management: Tylenol as needed 4. Mood/dementia: Aricept 10 mg daily. Discuss baseline cognition with family. Bed alarm for safety 5. Neuropsych: This patient is not capable of making decisions on her own behalf. 6. Skin/Wound Care: Routine skin checks 7. Fluids/Electrolytes/Nutrition: Routine I&O   Hypokalemia: 3.4 on 12/22. Replaced   Will recheck labs tomorrow  8. Diabetes mellitus and peripheral neuropathy. Hemoglobin A1c 5.8. Sliding scale insulin. Check blood sugars before meals and at bedtime. Patient on Glucophage XR 500 mg daily prior to admission. Resume as tolerated  Fasting CBG 83 this AM 9. Hyperlipidemia. Lipitor 10. Anemia  Hb 11.2 on 12/22  Cont to monitor, will check labs tomorrow  11. HTN  Labile at present. Will not initiate medications due to recent stroke and avoidance of hypotension  12. Hypoalbuminemia:  Protein supplementation initiated   LOS (Days) 5 A FACE TO FACE EVALUATION WAS PERFORMED  Daquavion Catala Lorie Phenix 05/23/2015 11:20 AM

## 2015-05-23 NOTE — Progress Notes (Signed)
Occupational Therapy Session Note  Patient Details  Name: Briana Thornton MRN: [id number] Date of Birth: [birth year]  Today's Date: 05/23/2015 OT Individual Time: 0930-1030 OT Individual Time Calculation (min): 60 min    Short Term Goals: Week 1:  OT Short Term Goal 1 (Week 1): Pt will be able to sit to stand to RW with mod A. OT Short Term Goal 2 (Week 1): Pt will complete stand pivot to toilet with mod A. OT Short Term Goal 3 (Week 1): Pt will bathe in shower with min A.  Skilled Therapeutic Interventions/Progress Updates:    Pt seen for skilled OT to facilitate functional mobility and postural control with self care skills. Pt participated very well today demonstrating improved initiation and motor control. Pt was able to push up from the armrests to stand at RW with min to mod a and stood with improved stance with RW in which she only needed min A to support her balance. Completed stand pivot transfer into shower with grab bar.  Pt took good initiative with all self care and only needed steadying A to stand to manage clothing over hips and slight A with button up shirt.  Pt left in w/c with quick release belt on with all needs met.    Therapy Documentation Precautions:  Precautions Precautions: Fall Precaution Comments: R side weakness Restrictions Weight Bearing Restrictions: No `   Pain: Pain Assessment Pain Assessment: No/denies pain ADL: ADL ADL Comments: refer to functional navigator  See Function Navigator for Current Functional Status.   Therapy/Group: Individual Therapy  Susa Bones 05/23/2015, 12:44 PM

## 2015-05-23 NOTE — Progress Notes (Signed)
Physical Therapy Session Note  Patient Details  Name: Briana Thornton MRN: [id number] Date of Birth: 06-05-1926  Today's Date: 05/23/2015 PT Individual Time: 1112-1212 PT Individual Time Calculation (min): 60 min   Short Term Goals: Week 1:  PT Short Term Goal 1 (Week 1): Pt will initiate w/c propulsion during pt session.  PT Short Term Goal 2 (Week 1): Pt will demonstrate sit to stand req mod A.  PT Short Term Goal 3 (Week 1): Pt will transfer bed to/from w/c req mod A.  PT Short Term Goal 4 (Week 1): Pt will ambulate x 15' with PT.  PT Short Term Goal 5 (Week 1): Pt will initiate stair training during PT session.   Skilled Therapeutic Interventions/Progress Updates:    Tx focused on functional mobility training, gait with RW, and NMR via forced use, manual facilitation, and multi-modal cues. Pt up in [place], reacdy for PT. All movements marked by decreased excursion and velocity. Posture marked by forward flexion, NMR for uprigth facilitation.   Pt propelled WC with bil UE/LE for increased NMR and activity tolerance x65' with increased time required. Pt needed visual cues for increasing stroke length and excursion.   Sit<>stand transfers at RW x5 throughout with Mod>>Min A overall and cues for technique with hands and anterior translation over BOS for decreased posterior fall risk.  Pt performed standing balance and [place] [facility] instruction in sitting and standing for balance training with mirror for visual feedback. Cues for technique and manual facilitation throughout for posture. Pt able to stand at [facility] with reaching tasks 2x3 min for increased balance challenge during functional tasks.   Gait x53' with RW and Min A overall with manual facilitation for posture and multi-modal cues for increasing step length, width, and foot clearance. Pt continues to demonstrate shuifling steps and freezing episodes, especially during turns and direction changes.   Toilet transfer from [facility] with Min A and grab  bars, safety cues. Pt left up in [facility] for lunch, all needs in reach.      Therapy Documentation Precautions:  Precautions Precautions: Fall Precaution Comments: R side weakness Restrictions Weight Bearing Restrictions: No   Pain: Pain Assessment Pain Assessment: No/denies pain    Other Treatments:     See Function Navigator for Current Functional Status.   Therapy/Group: Individual Therapy  Margrett Kalb, Corinna Lines, PT, DPT  05/23/2015, 11:43 AM

## 2015-05-23 NOTE — Progress Notes (Signed)
Physical Therapy Session Note  Patient Details  Name: Briana Thornton MRN: [id number] Date of Birth: 24-Dec-1926  Today's Date: 05/23/2015 PT Individual Time: 1415-1500 PT Individual Time Calculation (min): 45 min   Short Term Goals: Week 1:  PT Short Term Goal 1 (Week 1): Pt will initiate w/c propulsion during pt session.  PT Short Term Goal 2 (Week 1): Pt will demonstrate sit to stand req mod A.  PT Short Term Goal 3 (Week 1): Pt will transfer bed to/from w/c req mod A.  PT Short Term Goal 4 (Week 1): Pt will ambulate x 15' with PT.  PT Short Term Goal 5 (Week 1): Pt will initiate stair training during PT session.   Skilled Therapeutic Interventions/Progress Updates:    Session focused on functional gait training with RW with focus on step length and gait pattern, functional transfers and standing balance during toileting tasks including clothing management and hygiene, and bed mobility and repositioning in the bed. Pt overall min assist level during session with extra time for all tasks but improving. Verbal cues throughout for hand placement and technique during transfers/mobility.  Pt pleased with progress. PT suggested use of heat for low back pain (pt reports it has muscle tightness) but pt declined this today stating it "might be too much."  Positioned in supine at end of session with all needs in reach.   Therapy Documentation Precautions:  Precautions Precautions: Fall Precaution Comments: R side weakness Restrictions Weight Bearing Restrictions: No  Pain:  Reports low back pain. Repositioned at end of session.    See Function Navigator for Current Functional Status.   Therapy/Group: Individual Therapy  Canary Brim Merrilee Jansky  Lars Masson, PT, DPT 05/23/2015, 3:02 PM

## 2015-05-23 NOTE — Progress Notes (Signed)
Occupational Therapy Note  Patient Details  Name: Briana Thornton MRN: [id number] Date of Birth: August 26, 1926   Today's Date: 05/23/2015 Late Entry for Therapy Session on 05/20/2015 OT Individual Time: 1300-1400  Pt denied pain Individual Therapy  Pt resting in recliner upon arrival eating lunch.  Pt agreeable to therapy.  Pt amb with RW approx 10' to w/c and initially propelled out of room into hallway.  Pt transitioned to gym and initially practiced car transfers.  Pt requires more than a reasonable amount of time to complete tasks with multiple rest breaks.  Pt engaged in dynamic standing tasks.  Pt required mod A for sit to stand and steady A for amb with RW. Pt remained in w/c with all needs within reach. Focus on activity tolerance, sit<>stand, standing balance, functional amb with RW, and safety awareness. Leotis Shames [facility] 05/23/2015, 11:51 AM

## 2015-05-24 ENCOUNTER — Inpatient Hospital Stay (HOSPITAL_COMMUNITY): Payer: Medicare Other

## 2015-05-24 ENCOUNTER — Inpatient Hospital Stay (HOSPITAL_COMMUNITY): Payer: Medicare Other | Admitting: Physical Therapy

## 2015-05-24 DIAGNOSIS — D649 Anemia, unspecified: Secondary | ICD-10-CM | POA: Insufficient documentation

## 2015-05-24 LAB — ALBUMIN: Albumin: 3 g/dL — ABNORMAL LOW (ref 3.5–5.0)

## 2015-05-24 LAB — CBC WITH DIFFERENTIAL/PLATELET
BASOS ABS: 0 10*3/uL (ref 0.0–0.1)
Basophils Relative: 1 %
Eosinophils Absolute: 0.3 10*3/uL (ref 0.0–0.7)
Eosinophils Relative: 5 %
HEMATOCRIT: 30.8 % — AB (ref 36.0–46.0)
HEMOGLOBIN: 9.9 g/dL — AB (ref 12.0–15.0)
LYMPHS ABS: 2 10*3/uL (ref 0.7–4.0)
LYMPHS PCT: 36 %
MCH: 29.4 pg (ref 26.0–34.0)
MCHC: 32.1 g/dL (ref 30.0–36.0)
MCV: 91.4 fL (ref 78.0–100.0)
Monocytes Absolute: 0.5 10*3/uL (ref 0.1–1.0)
Monocytes Relative: 8 %
NEUTROS ABS: 2.8 10*3/uL (ref 1.7–7.7)
Neutrophils Relative %: 50 %
Platelets: 252 10*3/uL (ref 150–400)
RBC: 3.37 MIL/uL — AB (ref 3.87–5.11)
RDW: 14.5 % (ref 11.5–15.5)
WBC: 5.6 10*3/uL (ref 4.0–10.5)

## 2015-05-24 LAB — BASIC METABOLIC PANEL
ANION GAP: 6 (ref 5–15)
BUN: 24 mg/dL — ABNORMAL HIGH (ref 6–20)
CALCIUM: 8.9 mg/dL (ref 8.9–10.3)
CHLORIDE: 111 mmol/L (ref 101–111)
CO2: 28 mmol/L (ref 22–32)
Creatinine, Ser: 0.91 mg/dL (ref 0.44–1.00)
GFR calc non Af Amer: 56 mL/min — ABNORMAL LOW (ref >60)
GLUCOSE: 90 mg/dL (ref 65–99)
POTASSIUM: 3.8 mmol/L (ref 3.5–5.1)
Sodium: 145 mmol/L (ref 135–145)

## 2015-05-24 LAB — GLUCOSE, CAPILLARY
GLUCOSE-CAPILLARY: 87 mg/dL (ref 65–99)
GLUCOSE-CAPILLARY: 107 mg/dL — AB (ref 65–99)
GLUCOSE-CAPILLARY: 172 mg/dL — AB (ref 65–99)

## 2015-05-24 MED ORDER — FERROUS SULFATE 325 (65 FE) MG PO TABS
325.0000 mg | ORAL_TABLET | Freq: Every day | ORAL | Status: DC
  Administered 2015-05-25 – 2015-05-28 (×4): 325 mg via ORAL
  Filled 2015-05-24 (×4): qty 1

## 2015-05-24 NOTE — Progress Notes (Signed)
PHYSICAL MEDICINE & REHABILITATION     PROGRESS NOTE   Subjective/Complaints:  Pt seen working with OT.  States believes she had a good night last night, but cannot recall.    ROS: Denies CP, SOB, n/v/d, however ?reliability  Objective: Vital Signs: Blood pressure 163/62, pulse 67, temperature 98.5 F (36.9 C), temperature source Oral, resp. rate 19, height 5\' 2"  (1.575 m), weight 50 kg (110 lb 3.7 oz), SpO2 98 %. No results found.  Recent Labs  05/24/15 0530  WBC 5.6  HGB 9.9*  HCT 30.8*  PLT 252    Recent Labs  05/24/15 0530  NA 145  K 3.8  CL 111  GLUCOSE 90  BUN 24*  CREATININE 0.91  CALCIUM 8.9   CBG (last 3)   Recent Labs  05/23/15 1630 05/23/15 2106 05/24/15 0743  GLUCAP 78 201* 87    Wt Readings from Last 3 Encounters:  05/18/15 50 kg (110 lb 3.7 oz)  05/16/15 52.3 kg (115 lb 4.8 oz)  04/26/15 47.741 kg (105 lb 4 oz)    Physical Exam:  BP 163/62 mmHg  Pulse 67  Temp(Src) 98.5 F (36.9 C) (Oral)  Resp 19  Ht 5\' 2"  (1.575 m)  Wt 50 kg (110 lb 3.7 oz)  BMI 20.16 kg/m2  SpO2 98% Constitutional: NAD. Frail appearing. Vital signs reviewed.  HENT: Normocephalic, atraumatic Eyes: Conj and EOM normal.  Neck: Normal range of motion. No thyromegaly present.  Cardiovascular: Normal rate and no murmurs.  Respiratory: Effort normal and breath sounds normal. No respiratory distress.  GI: Soft. Bowel sounds are normal. She exhibits no distension.  Neurological: She is alert and oriented x1.  Makes good eye contact with examiner.  Follows commands. Motor: A&Ox2 with increased time and cues RUE >/3/5 proximal to distal (poor initiation)  RLE 2/5 proximal to distal (poor initiation) LUE: >/3/5 proximal to distal (poor initiation) LLE: 2/5 proximal to distal (poor initiation) Skin: Skin is warm and dry.  Psychiatric:  Pleasant, cooperative, delayed, confused  Assessment/Plan: 1. Functional deficits secondary to left pontine  infarct secondary to small vessel disease which require 3+ hours per day of interdisciplinary therapy in a comprehensive inpatient rehab setting. Physiatrist is providing close team supervision and 24 hour management of active medical problems listed below. Physiatrist and rehab team continue to assess barriers to discharge/monitor patient progress toward functional and medical goals.  Function:  Bathing Bathing position   Position: Shower  Bathing parts Body parts bathed by patient: Right arm, Left arm, Chest, Front perineal area, Abdomen, Right upper leg, Left upper leg Body parts bathed by helper: Right lower leg, Left lower leg, Back, Buttocks  Bathing assist Assist Level: Touching or steadying assistance(Pt > 75%)      Upper Body Dressing/Undressing Upper body dressing   What is the patient wearing?: Pull over shirt/dress, Button up shirt     Pull over shirt/dress - Perfomed by patient: Thread/unthread right sleeve, Thread/unthread left sleeve, Put head through opening, Pull shirt over trunk Pull over shirt/dress - Perfomed by helper: Put head through opening, Pull shirt over trunk Button up shirt - Perfomed by patient: Thread/unthread left sleeve Button up shirt - Perfomed by helper: Thread/unthread right sleeve, Pull shirt around back    Upper body assist Assist Level: Set up   Set up : To obtain clothing/put away  Lower Body Dressing/Undressing Lower body dressing   What is the patient wearing?: Underwear, Pants, Socks, Shoes Underwear - Performed by patient: Thread/unthread left  underwear leg, Thread/unthread right underwear leg Underwear - Performed by helper: Pull underwear up/down Pants- Performed by patient: Thread/unthread right pants leg, Thread/unthread left pants leg, Pull pants up/down Pants- Performed by helper: Pull pants up/down     Socks - Performed by patient: Don/doff right sock, Don/doff left sock   Shoes - Performed by patient: Don/doff right shoe,  Don/doff left shoe, Fasten right, Fasten left Shoes - Performed by helper: Don/doff right shoe, Don/doff left shoe, Fasten right, Fasten left          Lower body assist Assist for lower body dressing: Touching or steadying assistance (Pt > 75%)      Toileting Toileting   Toileting steps completed by patient: Adjust clothing prior to toileting, Performs perineal hygiene Toileting steps completed by helper: Adjust clothing after toileting Toileting Assistive Devices: Grab bar or rail  Toileting assist Assist level: Touching or steadying assistance (Pt.75%)   Transfers Chair/bed transfer   Chair/bed transfer method: Ambulatory Chair/bed transfer assist level: Touching or steadying assistance (Pt > 75%) Chair/bed transfer assistive device: Armrests, Medical sales representative     Max distance: 90 Assist level: Touching or steadying assistance (Pt > 75%)   Wheelchair   Type: Manual Max wheelchair distance: 41' Assist Level: Touching or steadying assistance (Pt > 75%)  Cognition Comprehension Comprehension assist level: Understands basic 50 - 74% of the time/ requires cueing 25 - 49% of the time  Expression Expression assist level: Expresses basic 75 - 89% of the time/requires cueing 10 - 24% of the time. Needs helper to occlude trach/needs to repeat words.  Social Interaction Social Interaction assist level: Interacts appropriately 90% of the time - Needs monitoring or encouragement for participation or interaction.  Problem Solving Problem solving assist level: Solves basic 50 - 74% of the time/requires cueing 25 - 49% of the time  Memory Memory assist level: Recognizes or recalls 25 - 49% of the time/requires cueing 50 - 75% of the time    Medical Problem List and Plan: 1. Right hemiparesis, gait instability secondary to left pontine infarct secondary to small vessel disease  Cont CIR 2. DVT Prophylaxis/Anticoagulation: SCDs. Monitor for any signs of DVT 3. Pain  Management: Tylenol as needed 4. Mood/dementia: Aricept 10 mg daily. Discuss baseline cognition with family. Bed alarm for safety 5. Neuropsych: This patient is not capable of making decisions on her own behalf. 6. Skin/Wound Care: Routine skin checks 7. Fluids/Electrolytes/Nutrition: Routine I&O   Hypokalemia: Improved. 3.8 on 12/27.   8. Diabetes mellitus and peripheral neuropathy. Hemoglobin A1c 5.8. Sliding scale insulin. Check blood sugars before meals and at bedtime. Patient on Glucophage XR 500 mg daily prior to admission. Resume as tolerated  Fasting CBG 87 this AM 9. Hyperlipidemia. Lipitor 10. Anemia  Hb 9.9 on 12/27  Will start supplementation  Cont to monitor  11. HTN  Elevated over the last 24 hours, however, labile previously. Will not initiate medications due to recent stroke and avoidance of hypotension  12. Hypoalbuminemia:  Protein supplementation initiated   LOS (Days) 6 A FACE TO FACE EVALUATION WAS PERFORMED  Ankit Lorie Phenix 05/24/2015 9:59 AM

## 2015-05-24 NOTE — Progress Notes (Signed)
Physical Therapy Note  Patient Details  Name: Briana Thornton MRN: [id number] Date of Birth: [birth year] Today's Date: 05/24/2015    Time: 1345-1416 31 minutes  1:1 No c/o pain. Pt states she feels "funny" and "tired" but that she would like to use the restroom.  Pt requires max A for supine to sit, supervision for scooting edge of bed and sit to stand with increased time.  Gait 15' x 2 with RW with close supervision. Toilet transfer with min A for 1 LOB with turning, steadying assist with pants/underwear down. Pt requires assist to pull up pants/underwear.  Pt min A to wash hands at sink in standing.  Pt positioned comfortably in w/c, still complaining of not feeling well. RN informed and blood glucose was elevated. Pt missed 14 minutes skilled PT.   Chyenne Sobczak 05/24/2015, 2:18 PM

## 2015-05-24 NOTE — Progress Notes (Signed)
Occupational Therapy Session Note  Patient Details  Name: Briana Thornton MRN: [id number] Date of Birth: 1926-07-11  Today's Date: 05/24/2015 OT Individual Time:0830-0930 60 min  Short Term Goals: Week 1:  OT Short Term Goal 1 (Week 1): Pt will be able to sit to stand to RW with mod A. OT Short Term Goal 2 (Week 1): Pt will complete stand pivot to toilet with mod A. OT Short Term Goal 3 (Week 1): Pt will bathe in shower with min A.  Skilled Therapeutic Interventions/Progress Updates: ADL-retraining with focus on functional mobility, transfer (troilet), toileting, improved activity tolerance, and endurance.  Pt received seated in w/c and reporting satisfaction with self-feeding d/t preferred meal provided.   Pt required min re-ed on role of therapy and planned activity however declined bathing/dressing.   Pt requested assist to toilet and required extra time and steadying assist to walk to toilet with RW.   With extra time pt doffed pants while standing with steadying assist, sat and voided urine.   Pt required mod assist to rise from toilet and min assist to pull up pants over left hip after completing toilet hygeine independently.   Pt rested briefly and ambulated from bathroom to water fountain in hallway (approx 75') with min guard assist before requested to sit in w/c and return to room.   Pt left in w/c with reading material and call light placed within reach.  Pt was able to define a new goal of improving her handwriting skills.   Pt educated on methods to improve [facility].     Therapy Documentation Precautions:  Precautions Precautions: Fall Precaution Comments: R side weakness Restrictions Weight Bearing Restrictions: No  Vital Signs: Therapy Vitals Temp: 98.5 F (36.9 C) Temp Source: Oral Pulse Rate: 67 Resp: 19 BP: (!) 163/62 mmHg Patient Position (if appropriate): Lying Oxygen Therapy SpO2: 98 % O2 Device: Not Delivered   ADL: ADL ADL Comments: refer to functional  navigator  See Function Navigator for Current Functional Status.   Therapy/Group: Individual Therapy   Second session: Time: 1130-1200 Time Calculation (min):  30 min  Pain Assessment: No/denies pain  Skilled Therapeutic Interventions: Therapeutic activity with focus on improved supporting sitting posture, transfer (to bed) and writing skills (left hand [place] activity).   Pt received with RN tech attending.  Pt demo'd poor postural control in w/c d/t fatigue and requested return to bed.   Pt completed transfer from w/c with mod assist and bed mobility with mod assist to lift legs.   After setup and extra time to provide re-ed on purpose of writing skills task (pt had forgotten her request), pt completed initial instruction on tracing triangles (2 times).   Pt persevered through task and was able to identify need for improved posture and positioning to continue practice.   OT advised revision of task context to supported sitting in chair or w/c.   Pt left in bed at end of session with all needs within reach.   See FIM for current functional status  Therapy/Group: Individual Therapy  Akaash Vandewater 05/24/2015, 7:03 AM

## 2015-05-24 NOTE — Progress Notes (Signed)
Occupational Therapy Session Note  Patient Details  Name: Briana Thornton MRN: [id number] Date of Birth: 11-04-1926  Today's Date: 05/24/2015 OT Individual Time: 0700-0758 OT Individual Time Calculation (min): 58 min    Short Term Goals: Week 1:  OT Short Term Goal 1 (Week 1): Pt will be able to sit to stand to RW with mod A. OT Short Term Goal 2 (Week 1): Pt will complete stand pivot to toilet with mod A. OT Short Term Goal 3 (Week 1): Pt will bathe in shower with min A.  Skilled Therapeutic Interventions/Progress Updates:    Pt asleep in bed upon arrival and required min verbal cues to arouse.  Pt declined bathing this morning and wanted to don the pants she wore the previous day.  Pt donned pants sitting EOB with close supervision.  Pt requested use of toilet and performed w/c<>toilet transfer with steady A.  Pt required assistance pulling up pants.  Pt completed grooming tasks seated in w/c at sink.  Pt requires more than a reasonable amount of time to complete all tasks. Pt performed sit<>stand from w/c with close supervision and extra time.  Pt remained in w/c with breakfast on bedside table. Focus on activity tolerance, sit<>stand, standing balance, functional transfers, and safety awareness to increase independence with BADLs.  Therapy Documentation Precautions:  Precautions Precautions: Fall Precaution Comments: R side weakness Restrictions Weight Bearing Restrictions: No Pain:  Pt denied pain ADL: ADL ADL Comments: refer to functional navigator  See Function Navigator for Current Functional Status.   Therapy/Group: Individual Therapy  Leroy Libman 05/24/2015, 7:58 AM

## 2015-05-25 ENCOUNTER — Inpatient Hospital Stay (HOSPITAL_COMMUNITY): Payer: Medicare Other

## 2015-05-25 ENCOUNTER — Inpatient Hospital Stay (HOSPITAL_COMMUNITY): Payer: Medicare Other | Admitting: Occupational Therapy

## 2015-05-25 ENCOUNTER — Inpatient Hospital Stay (HOSPITAL_COMMUNITY): Payer: Medicare Other | Admitting: Physical Therapy

## 2015-05-25 MED ORDER — SENNOSIDES-DOCUSATE SODIUM 8.6-50 MG PO TABS
2.0000 | ORAL_TABLET | Freq: Every evening | ORAL | Status: DC | PRN
  Administered 2015-05-26: 2 via ORAL
  Filled 2015-05-25: qty 2

## 2015-05-25 MED ORDER — LISINOPRIL 5 MG PO TABS
5.0000 mg | ORAL_TABLET | Freq: Every day | ORAL | Status: DC
  Administered 2015-05-25 – 2015-05-26 (×2): 5 mg via ORAL
  Filled 2015-05-25 (×2): qty 1

## 2015-05-25 NOTE — Progress Notes (Signed)
Occupational Therapy Session Note  Patient Details  Name: Briana Thornton MRN: [id number] Date of Birth: 05/02/1927  Today's Date: 05/25/2015 OT Individual Time: [id number] OT Individual Time Calculation (min): 30 min    Short Term Goals: Week 1:  OT Short Term Goal 1 (Week 1): Pt will be able to sit to stand to RW with mod A. OT Short Term Goal 2 (Week 1): Pt will complete stand pivot to toilet with mod A. OT Short Term Goal 3 (Week 1): Pt will bathe in shower with min A.  Skilled Therapeutic Interventions/Progress Updates:    Treatment session with focus on therapeutic activity, sit > stand, and standing tolerance.  Pt required increased time for sit > stand, however able to come up to standing with light min assist at hip to promote anterior weight shift to assist into standing.  Engaged in table top task in standing with focus on standing balance and activity tolerance.  Pt able to maintain standing for 3 mins prior to requiring seated rest break.  Able to complete 3 rounds of standing for approx 3 mins each with extended rest break each time.  Therapy Documentation Precautions:  Precautions Precautions: Fall Precaution Comments: R side weakness Restrictions Weight Bearing Restrictions: No General:   Vital Signs: Therapy Vitals Temp: 98.6 F (37 C) Temp Source: Oral Pulse Rate: 64 Resp: 17 BP: (!) 121/46 mmHg Patient Position (if appropriate): Sitting Oxygen Therapy SpO2: 98 % O2 Device: Not Delivered Pain:  Pt with no c/o pain  See Function Navigator for Current Functional Status.   Therapy/Group: Individual Therapy  Simonne Come 05/25/2015, 4:14 PM

## 2015-05-25 NOTE — Progress Notes (Signed)
Social Work Patient ID: Briana Thornton, female   DOB: May 25, 1927, 81 y.o.   MRN: [id number] Message left for daughter-[name] to inform team conference goals-supervision/min level and discharge 12/31. Will await return call from daughter.

## 2015-05-25 NOTE — Progress Notes (Signed)
Occupational Therapy Session Note  Patient Details  Name: Briana Thornton MRN: [id number] Date of Birth: 11-29-1926  Today's Date: 05/25/2015 OT Individual Time: 0730-0800 OT Individual Time Calculation (min): 30 min    Short Term Goals: Week 1:  OT Short Term Goal 1 (Week 1): Pt will be able to sit to stand to RW with mod A. OT Short Term Goal 2 (Week 1): Pt will complete stand pivot to toilet with mod A. OT Short Term Goal 3 (Week 1): Pt will bathe in shower with min A.  Skilled Therapeutic Interventions/Progress Updates:    Pt seen for OT tx session focusing on ADL re-training and functional mobility. Pt asleep in supine upon arrival requiring urging to participate in therapy. While in supine pt voiced that she had soiled brief. She came to EOB with verbal and tactile cuing for technique of bed mobility with significantly increased time. She stood at RW from EOB to pull down brief and complete pericare/ hygiene. She sat on EOB to thread clean brief and pants, standing with supervision- min steadying assist to pull up pants. She ambulated ~69ft to w/c. Pt left set-up with breakfast tray sitting in w/c. Quick release belt donned and all needs in reach.   Therapy Documentation Precautions:  Precautions Precautions: Fall Precaution Comments: R side weakness Restrictions Weight Bearing Restrictions: No Pain: Pain Assessment Pain Assessment: 0-10 Pain Score: no/ denies pain ADL: ADL ADL Comments: refer to functional navigator  See Function Navigator for Current Functional Status.   Therapy/Group: Individual Therapy  Lewis, Shirel Mallis C 05/25/2015, 7:10 AM

## 2015-05-25 NOTE — Progress Notes (Signed)
[facility]     PROGRESS NOTE   Subjective/Complaints:   Pt seen this AM working with therapies.  She believes she had a good night and is happy that she was able to get her pants on this morning.   ROS: Denies CP, SOB, n/v/d, however ?reliability  Objective: Vital Signs: Blood pressure 178/70, pulse 74, temperature 98.2 F (36.8 C), temperature source Oral, resp. rate 17, height 5\' 2"  (1.575 m), weight 50 kg (110 lb 3.7 oz), SpO2 97 %. No results found.  Recent Labs  05/24/15 0530  WBC 5.6  HGB 9.9*  HCT 30.8*  PLT 252    Recent Labs  05/24/15 0530  NA 145  K 3.8  CL 111  GLUCOSE 90  BUN 24*  CREATININE 0.91  CALCIUM 8.9   CBG (last 3)   Recent Labs  05/24/15 0743 05/24/15 1111 05/24/15 1413  GLUCAP 87 107* 172*    Wt Readings from Last 3 Encounters:  05/18/15 50 kg (110 lb 3.7 oz)  05/16/15 52.3 kg (115 lb 4.8 oz)  04/26/15 47.741 kg (105 lb 4 oz)    Physical Exam:  BP 178/70 mmHg  Pulse 74  Temp(Src) 98.2 F (36.8 C) (Oral)  Resp 17  Ht 5\' 2"  (1.575 m)  Wt 50 kg (110 lb 3.7 oz)  BMI 20.16 kg/m2  SpO2 97% Constitutional: NAD. Frail appearing. Vital signs reviewed.  HENT: Normocephalic, atraumatic Eyes: Conj and EOM normal.  Neck: Normal range of motion. No thyromegaly present.  Cardiovascular: Normal rate and no murmurs.  Respiratory: Effort normal and breath sounds normal. No respiratory distress.  GI: Soft. Bowel sounds are normal. She exhibits no distension.  Neurological: She is alert and oriented x1.  Makes good eye contact with examiner.  Follows commands. Motor: A&Ox2 with increased time and cues RUE 4-/5 proximal to distal  RLE 4-/5 proximal to distal  LUE: 4-/5 proximal to distal  LLE: 4-/5 proximal to distal  Skin: Skin is warm and dry.  Psychiatric:  Pleasant, cooperative, delayed, confused  Assessment/Plan: 1. 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 Physiatrist is providing close team supervision and 24 hour management of active medical problems listed below. Physiatrist and rehab team continue to assess barriers to discharge/monitor patient progress toward functional and medical goals.  Function:  Bathing Bathing position   Position: Shower  Bathing parts Body parts bathed by patient: Right arm, Left arm, Chest, Front perineal area, Abdomen, Right upper leg, Left upper leg Body parts bathed by helper: Right lower leg, Left lower leg, Back, Buttocks  Bathing assist Assist Level: Touching or steadying assistance(Pt > 75%)      Upper Body Dressing/Undressing Upper body dressing   What is the patient wearing?: Pull over shirt/dress, Button up shirt     Pull over shirt/dress - Perfomed by patient: Thread/unthread right sleeve, Thread/unthread left sleeve, Put head through opening, Pull shirt over trunk Pull over shirt/dress - Perfomed by helper: Put head through opening, Pull shirt over trunk Button up shirt - Perfomed by patient: Thread/unthread left sleeve Button up shirt - Perfomed by helper: Thread/unthread right sleeve, Pull shirt around back    Upper body assist Assist Level: Set up   Set up : To obtain clothing/put away  Lower Body Dressing/Undressing Lower body dressing   What is the patient wearing?: Underwear, Pants, Socks, Shoes Underwear -  Performed by patient: Thread/unthread left underwear leg, Thread/unthread right underwear leg Underwear - Performed by helper: Pull underwear up/down Pants- Performed by patient: Thread/unthread right pants leg, Thread/unthread left pants leg, Pull pants up/down Pants- Performed by helper: Pull pants up/down     Socks - Performed by patient: Don/doff right sock, Don/doff left sock   Shoes - Performed by patient: Don/doff right shoe, Don/doff left shoe,  Fasten right, Fasten left Shoes - Performed by helper: Don/doff right shoe, Don/doff left shoe, Fasten right, Fasten left          Lower body assist Assist for lower body dressing: Touching or steadying assistance (Pt > 75%)      Toileting Toileting   Toileting steps completed by patient: Adjust clothing prior to toileting, Performs perineal hygiene Toileting steps completed by helper: Adjust clothing after toileting Toileting Assistive Devices: Grab bar or rail  Toileting assist Assist level: Touching or steadying assistance (Pt.75%)   Transfers Chair/bed transfer   Chair/bed transfer method: Ambulatory Chair/bed transfer assist level: Touching or steadying assistance (Pt > 75%) Chair/bed transfer assistive device: Armrests, Medical sales representative     Max distance: 90 Assist level: Touching or steadying assistance (Pt > 75%)   Wheelchair   Type: Manual Max wheelchair distance: 65' Assist Level: Touching or steadying assistance (Pt > 75%)  Cognition Comprehension Comprehension assist level: Understands basic 50 - 74% of the time/ requires cueing 25 - 49% of the time  Expression Expression assist level: Expresses basic 75 - 89% of the time/requires cueing 10 - 24% of the time. Needs helper to occlude trach/needs to repeat words.  Social Interaction Social Interaction assist level: Interacts appropriately 90% of the time - Needs monitoring or encouragement for participation or interaction.  Problem Solving Problem solving assist level: Solves basic 50 - 74% of the time/requires cueing 25 - 49% of the time  Memory Memory assist level: Recognizes or recalls 25 - 49% of the time/requires cueing 50 - 75% of the time    Medical Problem List and Plan: 1. Right hemiparesis, gait instability secondary to left pontine infarct secondary to small vessel disease  Cont CIR 2. DVT Prophylaxis/Anticoagulation: SCDs. Monitor for any signs of DVT 3. Pain Management: Tylenol as  needed 4. Mood/dementia: Aricept 10 mg daily. Discuss baseline cognition with family. Bed alarm for safety 5. Neuropsych: This patient is not capable of making decisions on her own behalf. 6. Skin/Wound Care: Routine skin checks 7. Fluids/Electrolytes/Nutrition: Routine I&O   Hypokalemia: Improved. 3.8 on 12/27.   8. Diabetes mellitus and peripheral neuropathy. Hemoglobin A1c 5.8. Sliding scale insulin. Check blood sugars before meals and at bedtime. Patient on Glucophage XR 500 mg daily prior to admission. Resume as tolerated   No Fasting CBG  this AM, will  Follow up and consider reintroducing home meds 9. Hyperlipidemia. Lipitor 10. Anemia  Hb 9.9 on 12/27  Supplementation initiated  Cont to monitor  11. HTN  Will start pt on lisinopril 5 12. Hypoalbuminemia:  Protein supplementation initiated   LOS (Days) 7 A FACE TO FACE EVALUATION WAS PERFORMED  Briana Thornton 05/25/2015 8:45 AM

## 2015-05-25 NOTE — Progress Notes (Signed)
Occupational Therapy Session Note  Patient Details  Name: Briana Thornton MRN: [id number] Date of Birth: 01/28/1927  Today's Date: 05/25/2015 OT Individual Time: 1000-1130 OT Individual Time Calculation (min): 90 min    Short Term Goals: Week 1:  OT Short Term Goal 1 (Week 1): Pt will be able to sit to stand to RW with mod A. OT Short Term Goal 2 (Week 1): Pt will complete stand pivot to toilet with mod A. OT Short Term Goal 3 (Week 1): Pt will bathe in shower with min A.  Skilled Therapeutic Interventions/Progress Updates:    Pt engaged in dynamic standing tasks and functional amb with RW in room and hallway.  Pt performs sit<>stand with close supervision from various heights, requiring more than a reasonable amount of time to complete tasks.  Pt stood at sink to perform grooming tasks.  Pt noted with difficulty maintaining upright posture when standing secondary to significant kyphotic posture.  Pt requires more than a reasonable amount of time to complete all tasks with multiple rest breaks.  Focus on activity tolerance, sit<>stand, standing balance, functional amb with RW, and safety awareness to increase independence with BADLs.  Therapy Documentation Precautions:  Precautions Precautions: Fall Precaution Comments: R side weakness Restrictions Weight Bearing Restrictions: No Pain: Pain Assessment Pain Assessment: 0-10 Pain Score: 4  Pain Type: Acute pain Pain Location: Back Pain Orientation: Left;Lower Pain Onset: With Activity Pain Intervention(s): Rest Multiple Pain Sites: No  Repositioned ADL: ADL ADL Comments: refer to functional navigator  See Function Navigator for Current Functional Status.   Therapy/Group: Individual Therapy  Leroy Libman 05/25/2015, 11:32 AM

## 2015-05-25 NOTE — Progress Notes (Signed)
Physical Therapy Session Note  Patient Details  Name: Briana Thornton MRN: [id number] Date of Birth: February 07, 1927  Today's Date: 05/25/2015 PT Individual Time: 0830-0930  PT Individual Time Calculation (min): 60 min   Short Term Goals: Week 1:  PT Short Term Goal 1 (Week 1): Pt will initiate w/c propulsion during pt session.  PT Short Term Goal 2 (Week 1): Pt will demonstrate sit to stand req mod A.  PT Short Term Goal 3 (Week 1): Pt will transfer bed to/from w/c req mod A.  PT Short Term Goal 4 (Week 1): Pt will ambulate x 15' with PT.  PT Short Term Goal 5 (Week 1): Pt will initiate stair training during PT session.   Skilled Therapeutic Interventions/Progress Updates:    Treatment Session 1: Pt received up in w/c, finishing up breakfast, quick release belt in place. Therapeutic Activity - Pt agreeable to toileting prior to leaving room, then brushing teeth and brushing hair. Pt ambulates 10' into bathroom with RW and close SBA, doffs & dons pants with cues for safety and completes own anterior hygiene after urinating. Pt brushes teeth in stand req SBA for safety, but managing toothpaste and toothbrush herself. For energy conservation, pt brushes hair in sit with set up assist for PT to bring pt comb. PT instructs pt in bed mobility training - req SBA and cues for hand placement during sit to stand from w/c with armrests and RW, SBA for stand-step/ambulatory transfer w/c to/from bed. Supervision and cues to get R leg as far on the bed as possible prior to lifting L leg, req increased time and SBA. Pt completes supine to sit eob transfer through long sit req SBA and increased time. Gait Training - PT instructs pt in ambulation with RW x 115' - req encouragement for this distance and initially SBA, but as shuffling and festination begins with turns, progressively req CGA for safety. Pt ended sitting eob with nurse tech present, about to go into the bathroom. Pt is continuing to make slow, but steady  progress. Continue per PT POC.     Therapy Documentation Precautions:  Precautions Precautions: Fall Precaution Comments: R side weakness Restrictions Weight Bearing Restrictions: No Pain: Pain Assessment Pain Assessment: 0-10 Pain Score: 4  Pain Type: Acute pain Pain Location: Back Pain Orientation: Left;Lower Pain Onset: With Activity Pain Intervention(s): Rest Multiple Pain Sites: No     See Function Navigator for Current Functional Status.   Therapy/Group: Individual Therapy  Kahealani Yankovich M 05/25/2015, 8:56 AM

## 2015-05-25 NOTE — Progress Notes (Signed)
Nutrition Follow-up  DOCUMENTATION CODES:   Non-severe (moderate) malnutrition in context of chronic illness  INTERVENTION:  Continue Glucerna Shake po BID, each supplement provides 220 kcal and 10 grams of protein.  Encourage adequate PO intake.  NUTRITION DIAGNOSIS:   Malnutrition related to chronic illness as evidenced by moderate depletions of muscle mass, moderate depletion of body fat; ongoing  GOAL:   Patient will meet greater than or equal to 90% of their needs; met  MONITOR:   PO intake, Supplement acceptance, Weight trends, Labs, I & O's  REASON FOR ASSESSMENT:   Malnutrition Screening Tool    ASSESSMENT:   81 y.o. right handed female with history of hypertension, diabetes mellitus with peripheral neuropathy,CHB with pacemaker, dementia  Presented 05/16/2015 with right sided weakness 1 week. Cranial CT scan shows early left pontine infarct as well as remote basal ganglia infarcts bilaterally, remote left corona radiata infarct  Meal completion has been varied from 10-100% with po intake of 100% at breakfast and lunch today. Pt currently has Glucerna Shake ordered with varied intake. RD to continue with current orders to aid in caloric and protein needs if/when po intake is poor.   Diet Order:  Diet regular Room service appropriate?: Yes; Fluid consistency:: Thin  Skin:  Reviewed, no issues  Last BM:  12/25  Height:   Ht Readings from Last 1 Encounters:  05/18/15 '5\' 2"'$  (1.575 m)    Weight:   Wt Readings from Last 1 Encounters:  05/18/15 110 lb 3.7 oz (50 kg)    Ideal Body Weight:  50 kg  BMI:  Body mass index is 20.16 kg/(m^2).  Estimated Nutritional Needs:   Kcal:  1350-1550  Protein:  55-65 grams   Fluid:  >/= 1.5 L/day  EDUCATION NEEDS:   No education needs identified at this time  Corrin Parker, MS, RD, LDN Pager # [phone] After hours/ weekend pager # [phone]

## 2015-05-25 NOTE — Patient Care Conference (Signed)
Inpatient RehabilitationTeam Conference and Plan of Care Update Date: 05/25/2015   Time: 2:15 PM    Patient Name: Briana Thornton      Medical Record Number: [id number]  Date of Birth: [birth year] Sex: Female         Room/Bed: 4W18C/4W18C-01 Payor Info: Payor: MEDICARE / Plan: MEDICARE PART A AND B / Product Type: *No Product type* /    Admitting Diagnosis: Left Pontine CVA   Admit Date/Time:  05/18/2015  6:18 PM Admission Comments: No comment available   Primary Diagnosis:  <principal problem not specified> Principal Problem: <principal problem not specified>  Patient Active Problem List   Diagnosis Date Noted  . Absolute anemia   . Labile blood pressure   . Hypoalbuminemia due to protein-calorie malnutrition ([place])   . Malnutrition of moderate degree 05/18/2015  . TIA (transient ischemic attack) 05/18/2015  . Left pontine cerebrovascular accident ([place]) 05/18/2015  . DM type 2 with diabetic peripheral neuropathy ([place]) 05/18/2015  . CAP (community acquired pneumonia) 03/05/2015  . SOB (shortness of breath) 03/05/2015  . Hypoxia 03/05/2015  . Weakness 03/05/2015  . Dehydration, mild 03/05/2015  . Controlled type 2 diabetes mellitus with diabetic nephropathy, without long-term current use of insulin ([place])   . Onychomycosis 10/31/2014  . UTI symptoms 01/25/2014  . Dysphagia 01/25/2014  . Abnormal finding on urinalysis 01/25/2014  . Dementia without behavioral disturbance 08/06/2013  . Postoperative anemia due to acute blood loss 07/26/2013  . Hip fracture ([place]) 07/24/2013  . Thyroid mass of unclear etiology 07/24/2013  . Fall at home 07/24/2013  . Cerumen impaction 04/21/2013  . Cognitive decline 02/15/2013  . Anemia 05/22/2012  . Depression 05/22/2012  . Dermatitis 05/22/2012  . Broken wrist   . Pacemaker-St.Jude 09/18/2011  . URI (upper respiratory infection) 08/31/2011  . Lymph node enlargement 08/31/2011  . Weight loss 07/31/2011  . Cardiovascular system disease  07/02/2011  . Hemorrhoid 07/02/2011  . Osteoporosis 07/02/2011  . Essential hypertension   . Hyperlipidemia   . IBS (irritable bowel syndrome)   . DM (diabetes mellitus) ([place])   . Calcification of cartilage   . BPPV (benign paroxysmal positional vertigo)   . Vision loss of right eye     Expected Discharge Date: Expected Discharge Date: 05/28/15  Team Members Present: Physician leading conference: Dr. Delice Lesch Social Worker Present: Ovidio Kin, LCSW Nurse Present: Dorien Chihuahua, RN PT Present: Guilford Shi, PT OT Present: Simonne Come, OT SLP Present: Windell Moulding, SLP PPS Coordinator present : Daiva Nakayama, RN, CRRN     Current Status/Progress Goal Weekly Team Focus  Medical   Right hemiparesis, gait instability secondary to left pontine infarct secondary to small vessel disease with HTN and anemia  Improve HTN, follow Hb  See above   Bowel/Bladder     Cont B & B-increase senna to two tabs instead of one   cont B & B   bowel regulation  Swallow/Nutrition/ Hydration     na        ADL's   min bathing and UB dressing, mod A LB dressing and toileting, min to mod A stand pivot transfers  min A overall  ADL retraining, functional mobilty with RW for ADL transfers, transfer training, postural control, balance, pt/family education   Mobility   grossly min A - supervision  min A transfers and gait; supervision w/c propulsion  family training   Communication     na        Safety/Cognition/ Behavioral Observations    no  unsafe behaviors        Pain     no pain issues        Skin     monitor skin-currently no issues           *See Care Plan and progress notes for long and short-term goals.  Barriers to Discharge: Elevated BPs, anemia    Possible Resolutions to Barriers:  Adjust HTN meds, started FeSO4    Discharge Planning/Teaching Needs:  HOme with daughter and son in-law who hope to be able to provide the care she requires at home      Team Discussion:  Goals  supervision/min assist level making good progress this week. Stopping BS checks per daughter's request. Increasing senna to two tabs. Decreased activity tolerance. SP evaluated and back at baseline. Ready for discharge 12/31.   Revisions to Treatment Plan:  None   Continued Need for Acute Rehabilitation Level of Care: The patient requires daily medical management by a physician with specialized training in physical medicine and rehabilitation for the following conditions: Daily direction of a multidisciplinary physical rehabilitation program to ensure safe treatment while eliciting the highest outcome that is of practical value to the patient.: Yes Daily medical management of patient stability for increased activity during participation in an intensive rehabilitation regime.: Yes Daily analysis of laboratory values and/or radiology reports with any subsequent need for medication adjustment of medical intervention for : Neurological problems  Briana Thornton, Gardiner Rhyme 05/26/2015, 12:25 PM

## 2015-05-26 ENCOUNTER — Inpatient Hospital Stay (HOSPITAL_COMMUNITY): Payer: Medicare Other

## 2015-05-26 ENCOUNTER — Inpatient Hospital Stay (HOSPITAL_COMMUNITY): Payer: Medicare Other | Admitting: Physical Therapy

## 2015-05-26 MED ORDER — KETOCONAZOLE 2 % EX CREA
TOPICAL_CREAM | Freq: Two times a day (BID) | CUTANEOUS | Status: DC
  Administered 2015-05-26: 18:00:00 via TOPICAL
  Filled 2015-05-26: qty 15

## 2015-05-26 MED ORDER — ASPIRIN 325 MG PO TABS: 325.0000 mg | ORAL_TABLET | Freq: Every day | ORAL | Status: DC

## 2015-05-26 MED ORDER — LISINOPRIL 10 MG PO TABS
10.0000 mg | ORAL_TABLET | Freq: Every day | ORAL | Status: DC
  Administered 2015-05-27: 10 mg via ORAL
  Filled 2015-05-26: qty 1

## 2015-05-26 NOTE — Progress Notes (Signed)
Social Work Patient ID: Briana Thornton, female   DOB: [birth year], 81 y.o.   MRN: [id number] Have not received return call form daughter. Glennon Hamilton reports daughter contacted her last night and got this worker's message regarding discharge and therapy goals. She is working and not able to call, but aware of the discharge date and was wondering if can move up to Friday after therapies. Have made pam-PA aware and will await call from daughter.

## 2015-05-26 NOTE — Progress Notes (Signed)
Physical Therapy Session Note  Patient Details  Name: Briana Thornton MRN: [id number] Date of Birth: [birth year]  Today's Date: 05/26/2015 PT Individual Time: 0830-0930 Treatment Session 2: 1530-1600 PT Individual Time Calculation (min): 60 min Treatment Session 2: 30 min  Short Term Goals: Week 1:  PT Short Term Goal 1 (Week 1): Pt will initiate w/c propulsion during pt session.  PT Short Term Goal 2 (Week 1): Pt will demonstrate sit to stand req mod A.  PT Short Term Goal 3 (Week 1): Pt will transfer bed to/from w/c req mod A.  PT Short Term Goal 4 (Week 1): Pt will ambulate x 15' with PT.  PT Short Term Goal 5 (Week 1): Pt will initiate stair training during PT session.   Skilled Therapeutic Interventions/Progress Updates:    Treatment Session 1: Pt received up in recliner after breakfast with quick release belt in place but no pants on, requesting to toilet. Therapeutic Activity - Pt completes all toileting steps with supervision, RW and grab bar. Pt requests to brush teeth after toileting and does so in stand at sink with supervision. PT recommends pt put on "dirty" (clean, but worn previous day) pants since she will be showering with OT after this PT session and should save clean clothes for after shower; pt agrees. Pt doffs shoes, dons pants sitting eob, and re-dons shoes all with increased time and supervision. Gait Training - PT instructs pt in ambulation with RW x 200' x 2 reps req SBA for safety - no LOB req assist from PT, but slouched posture, narrow BOS, and slight limp off of R hemiplegic LE noted. PT instructs pt in ascending/descending 8 (3" height ) steps with B hands on one rail req steadying assist  - pt demonstrates a step over step pattern on ascent, but step to pattern on descent with slight increase in instability noted. Pt ended up in recliner with all needs in reach, awaiting OT session in 15 minutes. Pt is continuing to make steady progress on IPR unit.   Treatment  Session 2: Pt received up in recliner, about to toilet with nurse tech, but PT agrees to take pt to the bathroom to work on toileting. Therapeutic Activity: Pt ambulates to bathroom with RW and CGA due to increased fatigue and unsteadiness, pt req steadying assist to doff & don pants in stand and is able to do her own toileting hygiene. An RN enters bathroom to examine pt's skin demarcation on mid-thigh and reports it is not ringworm. Pt ambulates out of toilet with RW and steading assist to eob, where she agrees to participate in standing strengthening and balance exercises. Therapeutic Exercise - PT instructs pt in standing strengthening and balance exercises with RW for UE support: high knee marches, hip extension, hip abduction, and heel raises: x 10 reps each. Pt ended resting in bed with all needs in reach and bed alarm on. Continue per PT POC.    Therapy Documentation Precautions:  Precautions Precautions: Fall Precaution Comments: R side weakness Restrictions Weight Bearing Restrictions: No Pain: Pain Assessment Pain Assessment: No/denies pain Treatment Session 2: Pt c/o 2/10 pain in sacrum area under allevyn patch. PT uses repositioning to decrease pt's pain.    See Function Navigator for Current Functional Status.   Therapy/Group: Individual Therapy  Briana Thornton M 05/26/2015, 8:50 AM

## 2015-05-26 NOTE — Progress Notes (Signed)
Occupational Therapy Session Note  Patient Details  Name: Briana Thornton MRN: [id number] Date of Birth: Aug 09, 1926  Today's Date: 05/26/2015 OT Individual Time: [id number] Individual Time Calculation (min): 63 min   Short Term Goals: Week 1:  OT Short Term Goal 1 (Week 1): Pt will be able to sit to stand to RW with mod A. OT Short Term Goal 2 (Week 1): Pt will complete stand pivot to toilet with mod A. OT Short Term Goal 3 (Week 1): Pt will bathe in shower with min A.  Skilled Therapeutic Interventions/Progress Updates:  ADL-retraining at shower level with focus on improved functional mobility, transfers, and adapted bathing/dressing skills.   Pt received seated in recliner and requesting shower and change of clothing.   Pt requires extra time, setup to place RW, min assist to stand, and min guard assist during mobility using RW.   Pt transfers to toilet, voids urine and accepts assist to undress while seated on toilet to expedite BADL.   Pt completes hygiene unassisted but requires steadying assist to stand to pull down pants and brief prior to sitting.   Pt advances to tub bench and bathes with only steadying assist while standing to wash periarea, min assist to wash buttocks (thoroughly).   Pt was able to cross her legs to wash her feet but requires extra time and cues to progress d/t time limitation.   Pt required mod assist to don new brief and pants while she stood d/t her preference to step into clothing in shower, conserving time and heat d/t poor thermoregulation.   Pt donned socks and shoes independently while seated in recliner at end of session with all needs placed within reach.     Therapy Documentation Precautions:  Precautions Precautions: Fall Precaution Comments: R side weakness Restrictions Weight Bearing Restrictions: No  Vital Signs: Therapy Vitals BP: (!) 166/60 mmHg   Pain: Pain Assessment Pain Assessment: No/denies pain  ADL: ADL ADL Comments: refer to  functional navigator  See Function Navigator for Current Functional Status.   Therapy/Group: Individual Therapy   Second session: Time: [id number] Time Calculation (min):  45 min  Pain Assessment: No/denies pain  Skilled Therapeutic Interventions: Therapeutic activity with focus on discharge planning, functional mobility, transfer, bed mobility (simulating "craftmatic" bed configurationi) and improved FMC of left hand (writing skills activity).  Pt received seated in recliner and reporting awareness of planned discharge (12/31) back home.   OT reviewed concerns relating to great grandchild present in home however pt able to describe return to prior routine with her private room and bath away from fall hazards.   Pt also reports use of audio monitor that her daughter maintains to alert to need for assist with transfers and toileting.   Pt then rose to RW and performed bed transfer with OT providing only standby assist.   Pt able to bring both feet in bed w/o use of bed rails, HOB only slightly elevated as similar to her craftmatic bed.   Pt returned to w/c and then continued handwriting exercises with 1:1 supervision to advance through selected tasks.   Pt demo'd improved control of pen with larger grip area.   OT educated pt on use of Dr. Susy Frizzle pens available locally as means to continue HEP at home.   Pt ed materials placed in pt handbook at end of session.   See FIM for current functional status  Therapy/Group: Individual Therapy  Dorell Gatlin 05/26/2015, 10:55 AM

## 2015-05-26 NOTE — Progress Notes (Signed)
[facility] PHYSICAL MEDICINE & REHABILITATION     PROGRESS NOTE   Subjective/Complaints:  Pt seen this AM sitting up in her chair.  She is smiling and states she slept well.  ROS: Denies CP, SOB, n/v/d, however ?reliability  Objective: Vital Signs: Blood pressure 166/60, pulse 60, temperature 97.7 F (36.5 C), temperature source Oral, resp. rate 17, height 5\' 2"  (1.575 m), weight 50 kg (110 lb 3.7 oz), SpO2 98 %. No results found.  Recent Labs  05/24/15 0530  WBC 5.6  HGB 9.9*  HCT 30.8*  PLT 252    Recent Labs  05/24/15 0530  NA 145  K 3.8  CL 111  GLUCOSE 90  BUN 24*  CREATININE 0.91  CALCIUM 8.9   CBG (last 3)   Recent Labs  05/24/15 0743 05/24/15 1111 05/24/15 1413  GLUCAP 87 107* 172*    Wt Readings from Last 3 Encounters:  05/18/15 50 kg (110 lb 3.7 oz)  05/16/15 52.3 kg (115 lb 4.8 oz)  04/26/15 47.741 kg (105 lb 4 oz)    Physical Exam:  BP 166/60 mmHg  Pulse 60  Temp(Src) 97.7 F (36.5 C) (Oral)  Resp 17  Ht 5\' 2"  (1.575 m)  Wt 50 kg (110 lb 3.7 oz)  BMI 20.16 kg/m2  SpO2 98% Constitutional: NAD. Frail appearing. Vital signs reviewed.  HENT: Normocephalic, atraumatic Eyes: Conj and EOM normal.  Neck: Normal range of motion. No thyromegaly present.  Cardiovascular: Normal rate and no murmurs.  Respiratory: Effort normal and breath sounds normal. No respiratory distress.  GI: Soft. Bowel sounds are normal. She exhibits no distension.  Neurological: She is alert and oriented x1.  Makes good eye contact with examiner.  Follows commands. Motor: A&Ox2 with increased time and cues RUE 4-/5 proximal to distal  RLE 4-/5 proximal to distal  LUE: 4-/5 proximal to distal  LLE: 4-/5 proximal to distal  Skin: Skin is warm and dry.  Psychiatric:  Pleasant, cooperative, delayed, confused  Assessment/Plan: 1. Functional deficits secondary to left pontine infarct secondary to small vessel disease which require 3+ hours per day of  interdisciplinary therapy in a comprehensive inpatient rehab setting. Physiatrist is providing close team supervision and 24 hour management of active medical problems listed below. Physiatrist and rehab team continue to assess barriers to discharge/monitor patient progress toward functional and medical goals.  Function:  Bathing Bathing position   Position: Shower  Bathing parts Body parts bathed by patient: Right arm, Left arm, Chest, Front perineal area, Abdomen, Right upper leg, Left upper leg Body parts bathed by helper: Right lower leg, Left lower leg, Back, Buttocks  Bathing assist Assist Level: Touching or steadying assistance(Pt > 75%)      Upper Body Dressing/Undressing Upper body dressing   What is the patient wearing?: Pull over shirt/dress, Button up shirt     Pull over shirt/dress - Perfomed by patient: Thread/unthread right sleeve, Thread/unthread left sleeve, Put head through opening, Pull shirt over trunk Pull over shirt/dress - Perfomed by helper: Put head through opening, Pull shirt over trunk Button up shirt - Perfomed by patient: Thread/unthread left sleeve Button up shirt - Perfomed by helper: Thread/unthread right sleeve, Pull shirt around back    Upper body assist Assist Level: Set up   Set up : To obtain clothing/put away  Lower Body Dressing/Undressing Lower body dressing   What is the patient wearing?: Pants, Socks, Shoes Underwear - Performed by patient: Thread/unthread left underwear leg, Thread/unthread right underwear leg, Pull underwear  up/down Underwear - Performed by helper: Pull underwear up/down Pants- Performed by patient: Thread/unthread right pants leg, Thread/unthread left pants leg, Pull pants up/down Pants- Performed by helper: Pull pants up/down     Socks - Performed by patient: Don/doff right sock, Don/doff left sock   Shoes - Performed by patient: Don/doff right shoe, Don/doff left shoe Shoes - Performed by helper: Fasten right,  Fasten left          Lower body assist Assist for lower body dressing:  (mod A)      Toileting Toileting   Toileting steps completed by patient: Adjust clothing prior to toileting, Performs perineal hygiene, Adjust clothing after toileting Toileting steps completed by helper: Adjust clothing after toileting Toileting Assistive Devices: Grab bar or rail  Toileting assist Assist level: Supervision or verbal cues   Transfers Chair/bed transfer   Chair/bed transfer method: Ambulatory Chair/bed transfer assist level: Supervision or verbal cues Chair/bed transfer assistive device: Walker, Air cabin crew     Max distance: 200' Assist level: Supervision or verbal cues   Wheelchair   Type: Manual Max wheelchair distance: 62' Assist Level: Touching or steadying assistance (Pt > 75%)  Cognition Comprehension Comprehension assist level: Understands basic 90% of the time/cues < 10% of the time  Expression Expression assist level: Expresses basic 90% of the time/requires cueing < 10% of the time.  Social Interaction Social Interaction assist level: Interacts appropriately with others with medication or extra time (anti-anxiety, antidepressant).  Problem Solving Problem solving assist level: Solves basic 50 - 74% of the time/requires cueing 25 - 49% of the time  Memory Memory assist level: Recognizes or recalls 25 - 49% of the time/requires cueing 50 - 75% of the time    Medical Problem List and Plan: 1. Right hemiparesis, gait instability secondary to left pontine infarct secondary to small vessel disease  Cont CIR 2. DVT Prophylaxis/Anticoagulation: SCDs. Monitor for any signs of DVT 3. Pain Management: Tylenol as needed 4. Mood/dementia: Aricept 10 mg daily. Discuss baseline cognition with family. Bed alarm for safety 5. Neuropsych: This patient is not capable of making decisions on her own behalf. 6. Skin/Wound Care: Routine skin checks 7.  Fluids/Electrolytes/Nutrition: Routine I&O   Hypokalemia: Improved. 3.8 on 12/27.   8. Diabetes mellitus and peripheral neuropathy. Hemoglobin A1c 5.8. Sliding scale insulin. Check blood sugars before meals and at bedtime. Patient on Glucophage XR 500 mg daily prior to admission.   Pt's daughter refusing finger stick and pt has had low readings, as such, will not resume home medications to ensure avoiding hypoglycemic episodes, especially in light of increased activity.  9. Hyperlipidemia. Lipitor 10. Anemia  Hb 9.9 on 12/27  Supplementation initiated  Cont to monitor  11. HTN  Will increase lisinopril to 10 12. Hypoalbuminemia:  Protein supplementation initiated   LOS (Days) 8 A FACE TO FACE EVALUATION WAS PERFORMED  Ankit Lorie Phenix 05/26/2015 10:33 AM

## 2015-05-26 NOTE — Progress Notes (Signed)
Social Work Elease Hashimoto, [place] Social Worker Signed  Patient Care Conference 05/25/2015  2:45 PM    Expand All Collapse All   Inpatient RehabilitationTeam Conference and Plan of Care Update Date: 05/25/2015   Time: 2:15 PM     Patient Name: Briana Thornton      Medical Record Number: [id number]  Date of Birth: 1926-06-18 Sex: Female         Room/Bed: 4W18C/4W18C-01 Payor Info: Payor: MEDICARE / Plan: MEDICARE PART A AND B / Product Type: *No Product type* /    Admitting Diagnosis: Left Pontine CVA   Admit Date/Time:  05/18/2015  6:18 PM Admission Comments: No comment available   Primary Diagnosis:  <principal problem not specified> Principal Problem: <principal problem not specified>    Patient Active Problem List     Diagnosis  Date Noted   .  Absolute anemia     .  Labile blood pressure     .  Hypoalbuminemia due to protein-calorie malnutrition ([place])     .  Malnutrition of moderate degree  05/18/2015   .  TIA (transient ischemic attack)  05/18/2015   .  Left pontine cerebrovascular accident ([place])  05/18/2015   .  DM type 2 with diabetic peripheral neuropathy ([place])  05/18/2015   .  CAP (community acquired pneumonia)  03/05/2015   .  SOB (shortness of breath)  03/05/2015   .  Hypoxia  03/05/2015   .  Weakness  03/05/2015   .  Dehydration, mild  03/05/2015   .  Controlled type 2 diabetes mellitus with diabetic nephropathy, without long-term current use of insulin ([place])     .  Onychomycosis  10/31/2014   .  UTI symptoms  01/25/2014   .  Dysphagia  01/25/2014   .  Abnormal finding on urinalysis  01/25/2014   .  Dementia without behavioral disturbance  08/06/2013   .  Postoperative anemia due to acute blood loss  07/26/2013   .  Hip fracture ([place])  07/24/2013   .  Thyroid mass of unclear etiology  07/24/2013   .  Fall at home  07/24/2013   .  Cerumen impaction  04/21/2013   .  Cognitive decline  02/15/2013   .  Anemia  05/22/2012   .  Depression  05/22/2012   .   Dermatitis  05/22/2012   .  Broken wrist     .  Pacemaker-St.Jude  09/18/2011   .  URI (upper respiratory infection)  08/31/2011   .  Lymph node enlargement  08/31/2011   .  Weight loss  07/31/2011   .  Cardiovascular system disease  07/02/2011   .  Hemorrhoid  07/02/2011   .  Osteoporosis  07/02/2011   .  Essential hypertension     .  Hyperlipidemia     .  IBS (irritable bowel syndrome)     .  DM (diabetes mellitus) ([place])     .  Calcification of cartilage     .  BPPV (benign paroxysmal positional vertigo)     .  Vision loss of right eye       Expected Discharge Date: Expected Discharge Date: 05/28/15  Team Members Present: Physician leading conference: Dr. Delice Lesch Social Worker Present: Ovidio Kin, LCSW Nurse Present: Dorien Chihuahua, RN PT Present: Guilford Shi, PT OT Present: Simonne Come, OT SLP Present: Windell Moulding, SLP PPS Coordinator present : Daiva Nakayama, RN, CRRN        Current Status/Progress  Goal  Weekly Team Focus   Medical     Right hemiparesis, gait instability secondary to left pontine infarct secondary to small vessel disease with HTN and anemia   Improve HTN, follow Hb  See above   Bowel/Bladder       Cont B & B-increase senna to two tabs instead of one    cont B & B    bowel regulation   Swallow/Nutrition/ Hydration       na         ADL's     min bathing and UB dressing, mod A LB dressing and toileting, min to mod A stand pivot transfers  min A overall  ADL retraining, functional mobilty with RW for ADL transfers, transfer training, postural control, balance, pt/family education    Mobility     grossly min A - supervision  min A transfers and gait; supervision w/c propulsion   family training   Communication       na         Safety/Cognition/ Behavioral Observations      no unsafe behaviors         Pain       no pain issues         Skin       monitor skin-currently no issues            *See Care Plan and progress notes for long and  short-term goals.    Barriers to Discharge:  Elevated BPs, anemia     Possible Resolutions to Barriers:   Adjust HTN meds, started FeSO4     Discharge Planning/Teaching Needs:   HOme with daughter and son in-law who hope to be able to provide the care she requires at home       Team Discussion:    Goals supervision/min assist level making good progress this week. Stopping BS checks per daughter's request. Increasing senna to two tabs. Decreased activity tolerance. SP evaluated and back at baseline. Ready for discharge 12/31.    Revisions to Treatment Plan:    None    Continued Need for Acute Rehabilitation Level of Care: The patient requires daily medical management by a physician with specialized training in physical medicine and rehabilitation for the following conditions: Daily direction of a multidisciplinary physical rehabilitation program to ensure safe treatment while eliciting the highest outcome that is of practical value to the patient.: Yes Daily medical management of patient stability for increased activity during participation in an intensive rehabilitation regime.: Yes Daily analysis of laboratory values and/or radiology reports with any subsequent need for medication adjustment of medical intervention for : Neurological problems  Elease Hashimoto 05/26/2015, 12:25 PM                  Patient ID: Briana Thornton, female   DOB: [birth year], 81 y.o.   MRN: [id number]

## 2015-05-26 NOTE — Progress Notes (Signed)
Social Work  Discharge Note  The overall goal for the admission was met for:   Discharge location: [place]  Length of Stay: Yes-10 DAYS  Discharge activity level: Yes-SUPERVISION/MIN LEVEL  Home/community participation: Yes  Services provided included: MD, RD, PT, OT, SLP, RN, CM, Pharmacy and SW  Financial Services: Medicare and Private Insurance: [place]  Follow-up services arranged: Home Health: [facility] and Patient/Family request agency HH: [facility], DME: NO NEEDS  Comments (or additional information):DAUGHTER AND SON [place] HAVE BEEN PROVIDING 24 HR CARE TO PT PRIOR TO ADMISSION.   Patient/Family verbalized understanding of follow-up arrangements: Yes  Individual responsible for coordination of the follow-up plan: [name]-DAUGHTER.   Confirmed correct DME delivered: Elease Hashimoto 05/26/2015    Elease Hashimoto

## 2015-05-26 NOTE — Consult Note (Addendum)
WOC wound consult note Reason for Consult: Consult requested for right thigh, suspected ringworm. Wound type: Right thigh with semicircular pattern of red dots, dry and scabbed, no open wound or drainage.  Affected area is approx 2 cm.  Pt states it has been there "over 10 years" and does not recall the etiology of how it occurred. She says her physician has seen the area before this hospital stay and he was not concerned.  Pt does any creams, lotions, or apply dressings prior to hospital stay.   Dressing procedure/placement/frequency: I do not believe ringworm would continue to be present for this amount of time. Pt declines offer of foam dressing to protect site from rubbing against clothes or linens. She denies pain at the site. No topical treatment indicated at this time. Please re-consult if further assistance is needed.  Thank-you,  Julien Girt MSN, [place], [place], [place], [place]

## 2015-05-27 ENCOUNTER — Inpatient Hospital Stay (HOSPITAL_COMMUNITY): Payer: Medicare Other | Admitting: Occupational Therapy

## 2015-05-27 ENCOUNTER — Inpatient Hospital Stay (HOSPITAL_COMMUNITY): Payer: Medicare Other

## 2015-05-27 MED ORDER — FERROUS SULFATE 325 (65 FE) MG PO TABS: 325.0000 mg | ORAL_TABLET | Freq: Every day | ORAL | Status: DC

## 2015-05-27 MED ORDER — LISINOPRIL 10 MG PO TABS: 10.0000 mg | ORAL_TABLET | Freq: Every day | ORAL | Status: DC

## 2015-05-27 NOTE — Progress Notes (Signed)
Occupational Therapy Session Note  Patient Details  Name: Briana Thornton MRN: [id number] Date of Birth: [birth year]  Today's Date: 05/27/2015 OT Individual Time: 1445-1530 OT Individual Time Calculation (min): 45 min    Short Term Goals: Week 1:  OT Short Term Goal 1 (Week 1): Pt will be able to sit to stand to RW with mod A. OT Short Term Goal 2 (Week 1): Pt will complete stand pivot to toilet with mod A. OT Short Term Goal 3 (Week 1): Pt will bathe in shower with min A.  Skilled Therapeutic Interventions/Progress Updates:  Upon entering the room, pt seated in wheelchair awaiting therapist. Pt with c/o fatigue but agreeable to OT intervention. Pt reports 3/10 soreness in B UEs. Pt ambulated 40' to dayroom with RW and increased time with supervision. Pt requiring min verbal cues for safety and for speed. Pt seated on mat with 3 reps of sit <>stand with controlled sit and  close supervision. Pt requiring rest break secondary to fatigue. Pt ambulating back to room in same manner as stated above. Pt performing toilet transfer, clothing management , and hygiene as well with supervision. Pt returning to sit in recliner chair with B LEs elevated and call bell within reach upon exiting the room.   Therapy Documentation Precautions:  Precautions Precautions: Fall Precaution Comments: R side weakness Restrictions Weight Bearing Restrictions: No ADL: ADL ADL Comments: refer to functional navigator Exercises:   Other Treatments:    See Function Navigator for Current Functional Status.   Therapy/Group: Individual Therapy  Phineas Semen 05/27/2015, 3:32 PM

## 2015-05-27 NOTE — Progress Notes (Signed)
[facility] PHYSICAL MEDICINE & REHABILITATION     PROGRESS NOTE   Subjective/Complaints:  Pt seen sitting up, eating breakfast this AM.  She states she had a good night and knows the plan is for her to go home today.    ROS: Denies CP, SOB, n/v/d, however ?reliability  Objective: Vital Signs: Blood pressure 150/58, pulse 59, temperature 97.6 F (36.4 C), temperature source Oral, resp. rate 16, height 5\' 2"  (1.575 m), weight 50 kg (110 lb 3.7 oz), SpO2 97 %. No results found. No results for input(s): WBC, HGB, HCT, PLT in the last 72 hours. No results for input(s): NA, K, CL, GLUCOSE, BUN, CREATININE, CALCIUM in the last 72 hours.  Invalid input(s): CO CBG (last 3)   Recent Labs  05/24/15 1111 05/24/15 1413  GLUCAP 107* 172*    Wt Readings from Last 3 Encounters:  05/18/15 50 kg (110 lb 3.7 oz)  05/16/15 52.3 kg (115 lb 4.8 oz)  04/26/15 47.741 kg (105 lb 4 oz)    Physical Exam:  BP 150/58 mmHg  Pulse 59  Temp(Src) 97.6 F (36.4 C) (Oral)  Resp 16  Ht 5\' 2"  (1.575 m)  Wt 50 kg (110 lb 3.7 oz)  BMI 20.16 kg/m2  SpO2 97% Constitutional: NAD. Frail appearing. Vital signs reviewed.  HENT: Normocephalic, atraumatic Eyes: Conj and EOM normal.  Neck: Normal range of motion. No thyromegaly present.  Cardiovascular: Normal rate and no murmurs.  Respiratory: Effort normal and breath sounds normal. No respiratory distress.  GI: Soft. Bowel sounds are normal. She exhibits no distension.  Neurological: She is alert and oriented x1.  Makes good eye contact with examiner.  Follows commands. Motor: A&Ox2 with increased time and cues RUE 4-/5 proximal to distal  RLE 4-/5 proximal to distal  LUE: 4-/5 proximal to distal  LLE: 4-/5 proximal to distal  Skin: Skin is warm and dry.  Psychiatric:  Pleasant, cooperative, delayed, confused  Assessment/Plan: 1. Functional deficits secondary to left pontine infarct secondary to small vessel disease which require 3+ hours  per day of interdisciplinary therapy in a comprehensive inpatient rehab setting. Physiatrist is providing close team supervision and 24 hour management of active medical problems listed below. Physiatrist and rehab team continue to assess barriers to discharge/monitor patient progress toward functional and medical goals.  Function:  Bathing Bathing position   Position: Shower  Bathing parts Body parts bathed by patient: Right arm, Left arm, Chest, Abdomen, Front perineal area, Right upper leg, Left upper leg, Right lower leg, Left lower leg, Buttocks Body parts bathed by helper: Back  Bathing assist Assist Level: Touching or steadying assistance(Pt > 75%)      Upper Body Dressing/Undressing Upper body dressing   What is the patient wearing?: Pull over shirt/dress     Pull over shirt/dress - Perfomed by patient: Thread/unthread right sleeve, Thread/unthread left sleeve, Put head through opening, Pull shirt over trunk Pull over shirt/dress - Perfomed by helper: Put head through opening, Pull shirt over trunk Button up shirt - Perfomed by patient: Thread/unthread left sleeve Button up shirt - Perfomed by helper: Thread/unthread right sleeve, Pull shirt around back    Upper body assist Assist Level: More than reasonable time, Set up   Set up : To obtain clothing/put away  Lower Body Dressing/Undressing Lower body dressing   What is the patient wearing?: Socks, Shoes, Underwear, Pants Underwear - Performed by patient: Pull underwear up/down Underwear - Performed by helper: Thread/unthread right underwear leg, Thread/unthread left underwear leg  Pants- Performed by patient: Thread/unthread right pants leg, Thread/unthread left pants leg, Pull pants up/down Pants- Performed by helper: Thread/unthread right pants leg, Thread/unthread left pants leg     Socks - Performed by patient: Don/doff right sock, Don/doff left sock   Shoes - Performed by patient: Don/doff right shoe, Don/doff left  shoe, Fasten right, Fasten left Shoes - Performed by helper: Fasten right, Fasten left          Lower body assist Assist for lower body dressing: Touching or steadying assistance (Pt > 75%)      Toileting Toileting   Toileting steps completed by patient: Adjust clothing prior to toileting, Performs perineal hygiene, Adjust clothing after toileting Toileting steps completed by helper: Adjust clothing after toileting Toileting Assistive Devices: Grab bar or rail  Toileting assist Assist level: Set up/obtain supplies, Supervision or verbal cues, Touching or steadying assistance (Pt.75%)   Transfers Chair/bed transfer   Chair/bed transfer method: Ambulatory Chair/bed transfer assist level: Supervision or verbal cues Chair/bed transfer assistive device: Walker, Air cabin crew     Max distance: 200' Assist level: Supervision or verbal cues   Wheelchair   Type: Manual Max wheelchair distance: 47' Assist Level: Touching or steadying assistance (Pt > 75%)  Cognition Comprehension Comprehension assist level: Understands complex 90% of the time/cues 10% of the time  Expression Expression assist level: Expresses complex ideas: With extra time/assistive device  Social Interaction Social Interaction assist level: Interacts appropriately with others with medication or extra time (anti-anxiety, antidepressant).  Problem Solving Problem solving assist level: Solves complex problems: With extra time  Memory Memory assist level: More than reasonable amount of time    Medical Problem List and Plan: 1. 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 DVT Prophylaxis/Anticoagulation: SCDs. Monitor for any signs of DVT 3. Pain Management: Tylenol as needed 4. Mood/dementia: Aricept 10 mg daily. Discuss baseline cognition with family. Bed alarm for safety 5. Neuropsych: This patient is not capable of  making decisions on her own behalf. 6. Skin/Wound Care: Routine skin checks 7. Fluids/Electrolytes/Nutrition: Routine I&O   Hypokalemia: Improved. 3.8 on 12/27.   8. Diabetes mellitus and peripheral neuropathy. Hemoglobin A1c 5.8. Sliding scale insulin. Check blood sugars before meals and at bedtime. Patient on Glucophage XR 500 mg daily prior to admission.   Pt's daughter refusing finger stick and pt has had low readings, as such, will not resume home medications to ensure avoiding hypoglycemic episodes, especially in light of increased activity.  Will need to follow up as outpt 9. Hyperlipidemia. Lipitor 10. Anemia  Hb 9.9 on 12/27  Supplementation initiated  Cont to monitor  11. HTN  lisinopril increased to 10 12. Hypoalbuminemia:  Protein supplementation initiated   LOS (Days) 9 A FACE TO FACE EVALUATION WAS PERFORMED  Ankit Lorie Phenix 05/27/2015 8:49 AM

## 2015-05-27 NOTE — Progress Notes (Signed)
Occupational Therapy Discharge Summary  Patient Details  Name: Briana Thornton MRN: [id number] Date of Birth: [birth year]   Patient has met 10 of 10 long term goals due to improved activity tolerance, improved balance, ability to compensate for deficits and improved attention.  Patient to discharge at overall Minimum Assist level.  Patient's care partner was not available for family training but per pt is independent to provide the necessary physical assistance at discharge as was provided prior to admission.    Reasons goals not met: n/a  Recommendation:  Patient will benefit from ongoing skilled OT services in home health setting to continue to advance functional skills in the area of BADL.  Equipment: No equipment provided  Reasons for discharge: treatment goals met  Patient/family agrees with progress made and goals achieved: Yes  OT Discharge Precautions/Restrictions  Precautions Precautions: Fall Precaution Comments: R side weakness Restrictions Weight Bearing Restrictions: No   Pain Pain Assessment Pain Assessment: No/denies pain  ADL ADL ADL Comments: refer to functional navigator  Vision/Perception  Vision- History Baseline Vision/History: Wears glasses Wears Glasses: Reading only Patient Visual Report: No change from baseline Vision- Assessment Vision Assessment?: No apparent visual deficits Eye Alignment: Within Functional Limits   Cognition Overall Cognitive Status: History of cognitive impairments - at baseline Arousal/Alertness: Awake/alert Orientation Level: Oriented to person;Oriented to place Attention: Sustained Focused Attention: Appears intact Sustained Attention: Appears intact Memory: Appears intact Awareness: Appears intact Problem Solving: Impaired Problem Solving Impairment: Functional complex Initiating: Appears intact Safety/Judgment: Appears intact  Sensation Sensation Light Touch: Appears Intact Proprioception: Appears  Intact Coordination Heel Shin Test: decreased excursion and speed RLE  Motor  Motor Motor: Hemiplegia;Abnormal tone;Abnormal postural alignment and control Motor - Skilled Clinical Observations: R side weakness, slow motor actions  Mobility  Bed Mobility Bed Mobility: Rolling Right;Rolling Left;Left Sidelying to Sit;Sit to Sidelying Left Rolling Right: 5: Supervision Rolling Right Details: Tactile cues for initiation Rolling Left: 4: Min assist Rolling Left Details: Manual facilitation for placement;Verbal cues for technique;Verbal cues for sequencing Left Sidelying to Sit: 5: Supervision Left Sidelying to Sit Details: Tactile cues for sequencing Sit to Sidelying Left: 5: Supervision Sit to Sidelying Left Details: Tactile cues for initiation;Verbal cues for sequencing Transfers Sit to Stand: 5: Supervision;With armrests   Trunk/Postural Assessment  Cervical Assessment Cervical Assessment: Exceptions to [facility] Cervical AROM Overall Cervical AROM: Deficits;Due to premorid status Overall Cervical AROM Comments: head postured onto forward head Thoracic Assessment Thoracic Assessment: Exceptions to [facility] Thoracic AROM Overall Thoracic AROM: Deficits;Due to premorid status Overall Thoracic AROM Comments: fixed kyphosis of spine Lumbar Assessment Lumbar Assessment: Exceptions to [facility] Lumbar AROM Overall Lumbar AROM: Deficits;Due to premorid status Overall Lumbar AROM Comments: flattened lumbar spine Postural Control Postural Control: Deficits on evaluation Trunk Control: retropulsion almost resolved unless very fatigued Righting Reactions: delayed - absent Postural Limitations: forward head with kyphotic posture and sacral sit   Balance Balance Balance Assessed: Yes Static Sitting Balance Static Sitting - Balance Support: Feet supported;Bilateral upper extremity supported Static Sitting - Level of Assistance: 5: Stand by assistance Dynamic Sitting Balance Dynamic Sitting -  Balance Support: Bilateral upper extremity supported;Feet supported Dynamic Sitting - Level of Assistance: 5: Stand by assistance Static Standing Balance Static Standing - Balance Support: Bilateral upper extremity supported;During functional activity Static Standing - Level of Assistance: 4: Min assist Dynamic Standing Balance Dynamic Standing - Balance Support: Bilateral upper extremity supported;During functional activity Dynamic Standing - Level of Assistance: 4: Min assist Dynamic Standing - Balance Activities:  (gait wiht  Rw)  Extremity/Trunk Assessment RUE Assessment RUE Assessment: Exceptions to [facility] RUE Strength RUE Overall Strength: Deficits Right Shoulder Flexion: 2+/5 RUE Tone RUE Tone: Within Functional Limits LUE Assessment LUE Assessment: Within Functional Limits   See Function Navigator for Current Functional Status.  Briana Thornton 05/27/2015, 2:28 PM

## 2015-05-27 NOTE — Progress Notes (Signed)
Occupational Therapy Session Note  Patient Details  Name: Briana Thornton MRN: [id number] Date of Birth: [birth year]  Today's Date: 05/27/2015 OT Individual Time: 1000-1130 OT Individual Time Calculation (min): 90 min    Short Term Goals: Week 1:  OT Short Term Goal 1 (Week 1): Pt will be able to sit to stand to RW with mod A. OT Short Term Goal 2 (Week 1): Pt will complete stand pivot to toilet with mod A. OT Short Term Goal 3 (Week 1): Pt will bathe in shower with min A.  Skilled Therapeutic Interventions/Progress Updates: ADL-retraining at shower level with focus on improved problem-solving, safety awareness, discharge planning and dynamic standing balance.   With extra time and frequent rest breaks, pt completes functional mobility to bathroom, tranfers to toilet, completes hygiene and clothing management and progresses to shower level bathing with overall supervision assist.  Pt requires intermittent assist to manage personal clothing items and DME present only to facilitate progression in session.   Pt able to maintain dynamic standing balance using grab bars effectively but required min instructional cues to problem-solve when confronted by complication of using available institutional fixtures that do not operate similar to residential fixtures.  Pt dressed in w/c at bedside, standing to pull up her pants unassisted, but required min assist to don buttoned pajama top due to fatigue.   Pt left in w/c at sink brushing her teeth with call light within reach from w/c level.     Therapy Documentation Precautions:  Precautions Precautions: Fall Precaution Comments: R side weakness Restrictions Weight Bearing Restrictions: No  Pain: Pain Assessment Pain Assessment: No/denies pain Pain Score: 0-No pain  ADL: ADL ADL Comments: refer to functional navigator   See Function Navigator for Current Functional Status.   Therapy/Group: Individual Therapy  [place] 05/27/2015, 12:25  PM

## 2015-05-27 NOTE — Progress Notes (Signed)
Physical Therapy Discharge Summary  Patient Details  Name: Briana Thornton MRN: [id number] Date of Birth: 04/12/1927  Today's Date: 05/27/2015 PT Individual Time: 0905-1000 PT Individual Time Calculation (min): 55 min   Patient has met 7 of 9 long term goals due to improved activity tolerance, improved balance, improved postural control, increased strength, ability to compensate for deficits, functional use of  right upper extremity and right lower extremity and improved awareness.  Patient to discharge at an ambulatory level Supervision.   Patient's care partner unavailable to provide the necessary physical assistance at discharge, however this PT expects that pt is currently at a similar functional level compared to PTA..  Reasons goals not met: w/c goals not met due to activity tolerance, limited focus of tx  Recommendation:  Patient will benefit from ongoing skilled PT services in home health setting to continue to advance safe functional mobility, address ongoing impairments in initiation, motor control, strength, balance , and minimize fall risk.  Equipment: No equipment provided  Reasons for discharge: treatment goals met and discharge from hospital  Patient agrees with progress made and goals achieved: Yes   tx today: neuromuscular re-education via demo, manual cues, VCs for R/L hip flexion, long arc quads, calf raises, ankle circles x 10 each with pt counting aloud to prevent holding breath.   PT Discharge Precautions/Restrictions Precautions Precautions: Fall Precaution Comments: R side weakness Restrictions Weight Bearing Restrictions: No Pain Pain Assessment Pain Assessment: No/denies pain Pain Score: 0-No pain Vision/Perception - no change from baseline; see OT report    Cognition Overall Cognitive Status: History of cognitive impairments - at baseline Arousal/Alertness: Awake/alert Orientation Level: Oriented to person;Oriented to  place Sensation Sensation Light Touch: Appears Intact Proprioception: Appears Intact Coordination Heel Shin Test: decreased excursion and speed RLE Motor  Motor Motor: Hemiplegia;Abnormal tone;Abnormal postural alignment and control Motor - Skilled Clinical Observations: R side weakness, slow motor actions  Mobility Bed Mobility Bed Mobility: Rolling Right;Rolling Left;Left Sidelying to Sit;Sit to Sidelying Left Rolling Right: 5: Supervision Rolling Right Details: Tactile cues for initiation Rolling Left: 4: Min assist Rolling Left Details: Manual facilitation for placement;Verbal cues for technique;Verbal cues for sequencing Left Sidelying to Sit: 5: Supervision Left Sidelying to Sit Details: Tactile cues for sequencing Sit to Sidelying Left: 5: Supervision Sit to Sidelying Left Details: Tactile cues for initiation;Verbal cues for sequencing Transfers Transfers: Yes Sit to Stand: 5: Supervision;With armrests Squat Pivot Transfers: 5: Supervision Squat Pivot Transfer Details: Verbal cues for technique Locomotion  Ambulation Ambulation: Yes Ambulation/Gait Assistance: 5: Supervision Ambulation Distance (Feet): 50 Feet Assistive device: Rolling walker Ambulation/Gait Assistance Details: Verbal cues for safe use of DME/AE;Verbal cues for technique Gait Gait: Yes Gait Pattern: Impaired Gait Pattern: Shuffle;Decreased step length - left;Decreased step length - right;Decreased hip/knee flexion - right;Decreased hip/knee flexion - left;Decreased dorsiflexion - right;Decreased dorsiflexion - left;Trunk flexed;Poor foot clearance - left;Poor foot clearance - right Gait velocity: extremely slow Stairs / Additional Locomotion Stairs: Yes Stairs Assistance: 4: Min guard Stair Management Technique: Two rails;Alternating pattern;Step to pattern (alternating ascending; non alternating descending) Number of Stairs: 8 Height of Stairs: 3 Wheelchair Mobility Wheelchair Mobility:  Yes Wheelchair Assistance: 4: Energy manager: Both upper extremities Wheelchair Parts Management: Needs assistance Distance: 75  Trunk/Postural Assessment  Cervical Assessment Cervical Assessment: Exceptions to [facility] Cervical AROM Overall Cervical AROM: Deficits;Due to premorid status Overall Cervical AROM Comments: head postured onto forward head Thoracic Assessment Thoracic Assessment: Exceptions to [facility] Thoracic AROM Overall Thoracic AROM: Deficits;Due to premorid status  Overall Thoracic AROM Comments: fixed kyphosis of spine Lumbar Assessment Lumbar Assessment: Exceptions to [facility] Lumbar AROM Overall Lumbar AROM: Deficits;Due to premorid status Overall Lumbar AROM Comments: flattened lumbar spine Postural Control Postural Control: Deficits on evaluation Trunk Control: retropulsion controlled with VCS, and almost resolved unless very fatigued Righting Reactions: delayed - absent Postural Limitations: forward head with kyphotic posture and sacral sit  Balance Balance Balance Assessed: Yes Static Sitting Balance Static Sitting - Balance Support: Feet supported;Bilateral upper extremity supported Static Sitting - Level of Assistance: 5: Stand by assistance Dynamic Sitting Balance Dynamic Sitting - Balance Support: Bilateral upper extremity supported;Feet supported Dynamic Sitting - Level of Assistance: 5: Stand by assistance Static Standing Balance Static Standing - Balance Support: Bilateral upper extremity supported;During functional activity Static Standing - Level of Assistance: 4: Min assist Dynamic Standing Balance Dynamic Standing - Balance Support: Bilateral upper extremity supported;During functional activity Dynamic Standing - Level of Assistance: 4: Min assist Dynamic Standing - Balance Activities:  (gait wiht Rw) Extremity Assessment      RLE Assessment RLE Assessment: Exceptions to [facility] RLE AROM (degrees) Overall AROM Right Lower Extremity:  Within functional limits for tasks assessed RLE Overall AROM Comments: WFLs with urging RLE Strength RLE Overall Strength: Deficits RLE Overall Strength Comments: hip flexion 345, knee grossly 4/+5, ankle DF 4+/5 LLE Assessment LLE Assessment: Within Functional Limits (grossly  4+/5)   See Function Navigator for Current Functional Status.  Briana Thornton 05/27/2015, 12:35 PM

## 2015-05-27 NOTE — Discharge Instructions (Signed)
Inpatient Rehab Discharge Instructions  Briana Thornton Discharge date and time: 05/27/15   Activities/Precautions/ Functional Status: Activity: activity as tolerated Diet: low fat, low cholesterol diet Wound Care: none needed   Functional status:  ___ No restrictions     ___ Walk up steps independently _X__ 24/7 supervision/assistance   ___ Walk up steps with assistance ___ Intermittent supervision/assistance  ___ Bathe/dress independently _X__ Walk with walker    __X_ Bathe/dress with assistance ___ Walk Independently    ___ Shower independently _X __ Walk with supervision    ___ Shower with assistance _X__ No alcohol     ___ Return to work/school ________        STROKE/TIA DISCHARGE INSTRUCTIONS SMOKING Cigarette smoking nearly doubles your risk of having a stroke & is the single most alterable risk factor  If you smoke or have smoked in the last 12 months, you are advised to quit smoking for your health.  Most of the excess cardiovascular risk related to smoking disappears within a year of stopping.  Ask you doctor about anti-smoking medications  [place] Quit Line: 1-800-QUIT NOW  Free Smoking Cessation Classes [phone]  CHOLESTEROL Know your levels; limit fat & cholesterol in your diet  Lipid Panel     Component Value Date/Time   CHOL 128 05/17/2015 0822   TRIG 61 05/17/2015 0822   HDL 65 05/17/2015 0822   CHOLHDL 2.0 05/17/2015 0822   VLDL 12 05/17/2015 0822   LDLCALC 51 05/17/2015 0822      Many patients benefit from treatment even if their cholesterol is at goal.  Goal: Total Cholesterol (CHOL) less than 160  Goal:  Triglycerides (TRIG) less than 150  Goal:  HDL greater than 40  Goal:  LDL (LDLCALC) less than 100   BLOOD PRESSURE American Stroke Association blood pressure target is less that 120/80 mm/Hg  Your discharge blood pressure is:  BP: (!) 150/58 mmHg  Monitor your blood pressure  Limit your salt and alcohol intake  Many individuals will  require more than one medication for high blood pressure  DIABETES (A1c is a blood sugar average for last 3 months) Goal HGBA1c is under 7% (HBGA1c is blood sugar average for last 3 months)  Diabetes:    Lab Results  Component Value Date   HGBA1C 5.8* 05/17/2015     Your HGBA1c can be lowered with medications, healthy diet, and exercise.  Check your blood sugar as directed by your physician  Call your physician if you experience unexplained or low blood sugars.  PHYSICAL ACTIVITY/REHABILITATION Goal is 30 minutes at least 4 days per week  Activity: Increase activity slowly, Therapies: see below Return to work: N/A  Activity decreases your risk of heart attack and stroke and makes your heart stronger.  It helps control your weight and blood pressure; helps you relax and can improve your mood.  Participate in a regular exercise program.  Talk with your doctor about the best form of exercise for you (dancing, walking, swimming, cycling).  DIET/WEIGHT Goal is to maintain a healthy weight  Your discharge diet is: Diet regular Room service appropriate?: Yes; Fluid consistency:: Thin  liquids Your height is:  Height: 5\' 2"  (157.5 cm) Your current weight is: Weight: 50 kg (110 lb 3.7 oz) Your Body Mass Index (BMI) is:  BMI (Calculated): 20.2  Following the type of diet specifically designed for you will help prevent another stroke.  You are at goal weight     Your goal Body Mass Index (BMI)  is 19-24.  Healthy food habits can help reduce 3 risk factors for stroke:  High cholesterol, hypertension, and excess weight.  RESOURCES Stroke/Support Group:  Call [phone]   STROKE EDUCATION PROVIDED/REVIEWED AND GIVEN TO PATIENT Stroke warning signs and symptoms How to activate emergency medical system (call 911). Medications prescribed at discharge. Need for follow-up after discharge. Personal risk factors for stroke. Pneumonia vaccine given:  Flu vaccine given:  My questions have been  answered, the writing is legible, and I understand these instructions.  I will adhere to these goals & educational materials that have been provided to me after my discharge from the hospital.    Special Instructions:   COMMUNITY REFERRALS UPON DISCHARGE:    Home Health:   [facility]   Date of last service:05/27/2015  Medical Equipment/Items Ordered:HAS FROM PREVIOUS ADMITS     GENERAL COMMUNITY RESOURCES FOR PATIENT/FAMILY: Support Groups:[facility]PPORT [facility] & [facility] SUPPORT [facility]   My questions have been answered and I understand these instructions. I will adhere to these goals and the provided educational materials after my discharge from the hospital.  Patient/Caregiver Signature _______________________________ Date __________  Clinician Signature _______________________________________ Date __________  Please bring this form and your medication list with you to all your follow-up doctor's appointments.

## 2015-05-28 MED ORDER — LISINOPRIL 5 MG PO TABS: 5.0000 mg | ORAL_TABLET | Freq: Every day | ORAL | Status: DC

## 2015-05-28 MED ORDER — LISINOPRIL 10 MG PO TABS: 10.0000 mg | ORAL_TABLET | Freq: Every day | ORAL | Status: DC

## 2015-05-28 NOTE — Progress Notes (Signed)
Pt discharge paperwork complete. Daughter verbalized understanding of meds, follow up appointments. Belongings packed. Escorted by NT to door.

## 2015-05-28 NOTE — Progress Notes (Signed)
[facility] PHYSICAL MEDICINE & REHABILITATION     PROGRESS NOTE   Subjective/Complaints:  Pt laying in bed this AM.  She is not sure why she did not go home yesterday, but is ready to go home today.    ROS: Denies CP, SOB, n/v/d, however ?reliability  Objective: Vital Signs: Blood pressure 160/56, pulse 59, temperature 98 F (36.7 C), temperature source Oral, resp. rate 16, height 5\' 2"  (1.575 m), weight 50 kg (110 lb 3.7 oz), SpO2 97 %. No results found. No results for input(s): WBC, HGB, HCT, PLT in the last 72 hours. No results for input(s): NA, K, CL, GLUCOSE, BUN, CREATININE, CALCIUM in the last 72 hours.  Invalid input(s): CO CBG (last 3)  No results for input(s): GLUCAP in the last 72 hours.  Wt Readings from Last 3 Encounters:  05/18/15 50 kg (110 lb 3.7 oz)  05/16/15 52.3 kg (115 lb 4.8 oz)  04/26/15 47.741 kg (105 lb 4 oz)    Physical Exam:  BP 160/56 mmHg  Pulse 59  Temp(Src) 98 F (36.7 C) (Oral)  Resp 16  Ht 5\' 2"  (1.575 m)  Wt 50 kg (110 lb 3.7 oz)  BMI 20.16 kg/m2  SpO2 97% Constitutional: NAD. Frail appearing. Vital signs reviewed.  HENT: Normocephalic, atraumatic Eyes: Conj and EOM normal.  Neck: Normal range of motion. No thyromegaly present.  Cardiovascular: Normal rate and no murmurs.  Respiratory: Effort normal and breath sounds normal. No respiratory distress.  GI: Soft. Bowel sounds are normal. She exhibits no distension.  Neurological: She is alert and oriented x1.  Makes good eye contact with examiner.  Follows commands. Motor: A&Ox2 with increased time and cues RUE 4-/5 proximal to distal  RLE 4-/5 proximal to distal  LUE: 4-/5 proximal to distal  LLE: 4-/5 proximal to distal  Skin: Skin is warm and dry.  Psychiatric:  Pleasant, cooperative, delayed, confused  Assessment/Plan: 1. 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 Physiatrist is providing close team supervision and 24 hour management of active medical problems listed below. Physiatrist and rehab team continue to assess barriers to discharge/monitor patient progress toward functional and medical goals.  Function:  Bathing Bathing position   Position: Shower  Bathing parts Body parts bathed by patient: Right arm, Left arm, Chest, Abdomen, Front perineal area, Buttocks, Right upper leg, Left upper leg, Right lower leg, Left lower leg Body parts bathed by helper: Back  Bathing assist Assist Level: Touching or steadying assistance(Pt > 75%)      Upper Body Dressing/Undressing Upper body dressing   What is the patient wearing?: Pull over shirt/dress, Button up shirt     Pull over shirt/dress - Perfomed by patient: Thread/unthread right sleeve, Thread/unthread left sleeve, Pull shirt over trunk Pull over shirt/dress - Perfomed by helper: Put head through opening Button up shirt - Perfomed by patient: Thread/unthread right sleeve, Thread/unthread left sleeve Button up shirt - Perfomed by helper: Pull shirt around back    Upper body assist Assist Level: Touching or steadying assistance(Pt > 75%)   Set up : To obtain clothing/put away  Lower Body Dressing/Undressing Lower body dressing   What is the patient wearing?: Pants, Socks, Shoes, Underwear Underwear - Performed by patient: Thread/unthread right underwear leg, Thread/unthread left underwear leg, Pull underwear up/down Underwear - Performed by helper: Thread/unthread right underwear leg, Thread/unthread left underwear leg Pants- Performed by  patient: Thread/unthread right pants leg, Thread/unthread left pants leg, Pull pants up/down Pants- Performed by helper: Thread/unthread right pants leg, Thread/unthread left pants leg     Socks - Performed by patient: Don/doff right sock, Don/doff left sock   Shoes - Performed by patient: Don/doff right shoe,  Don/doff left shoe Shoes - Performed by helper: Fasten right, Fasten left          Lower body assist Assist for lower body dressing: Supervision or verbal cues      Toileting Toileting   Toileting steps completed by patient: Adjust clothing prior to toileting, Performs perineal hygiene, Adjust clothing after toileting Toileting steps completed by helper: Adjust clothing after toileting Toileting Assistive Devices: Grab bar or rail  Toileting assist Assist level: Supervision or verbal cues   Transfers Chair/bed transfer   Chair/bed transfer method: Ambulatory Chair/bed transfer assist level: Supervision or verbal cues Chair/bed transfer assistive device: Walker, Air cabin crew     Max distance: 150 Assist level: Supervision or verbal cues   Wheelchair   Type: Manual Max wheelchair distance: 75 Assist Level: Touching or steadying assistance (Pt > 75%)  Cognition Comprehension Comprehension assist level: Understands basic 90% of the time/cues < 10% of the time  Expression Expression assist level: Expresses basic 90% of the time/requires cueing < 10% of the time.  Social Interaction Social Interaction assist level: Interacts appropriately with others with medication or extra time (anti-anxiety, antidepressant).  Problem Solving Problem solving assist level: Solves basic 50 - 74% of the time/requires cueing 25 - 49% of the time  Memory Memory assist level: Recognizes or recalls 25 - 49% of the time/requires cueing 50 - 75% of the time    Medical Problem List and Plan: 1. 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 DVT Prophylaxis/Anticoagulation: SCDs. Monitor for any signs of DVT 3. Pain Management: Tylenol as needed 4. Mood/dementia: Aricept 10 mg daily. Discuss baseline cognition with family. Bed alarm for safety 5. Neuropsych: This patient is not capable of making decisions on  her own behalf. 6. Skin/Wound Care: Routine skin checks 7. Fluids/Electrolytes/Nutrition: Routine I&O   Hypokalemia: Improved. 3.8 on 12/27.   8. Diabetes mellitus and peripheral neuropathy. Hemoglobin A1c 5.8. Sliding scale insulin. Check blood sugars before meals and at bedtime. Patient on Glucophage XR 500 mg daily prior to admission.   Pt's daughter refusing finger stick and pt has had low readings, as such, will not resume home medications to ensure avoiding hypoglycemic episodes, especially in light of increased activity.  Will need to follow up as outpt 9. Hyperlipidemia. Lipitor 10. Anemia  Hb 9.9 on 12/27  Supplementation initiated  Cont to monitor  11. HTN  Discharged with lisinopril 5, will follow up with PCP 12. Hypoalbuminemia:  Protein supplementation initiated   LOS (Days) 10 A FACE TO FACE EVALUATION WAS PERFORMED  Ankit Lorie Phenix 05/28/2015 8:03 AM

## 2015-05-29 NOTE — Discharge Summary (Signed)
Discharge summary job # [id number]

## 2015-05-30 ENCOUNTER — Emergency Department (HOSPITAL_COMMUNITY): Payer: Medicare Other

## 2015-05-30 ENCOUNTER — Inpatient Hospital Stay (HOSPITAL_COMMUNITY)
Admission: EM | Admit: 2015-05-30 | Discharge: 2015-06-03 | DRG: 481 | Disposition: A | Discharge status: Skilled Nursing Facility | Payer: Medicare Other | Attending: Internal Medicine | Admitting: Internal Medicine

## 2015-05-30 ENCOUNTER — Encounter (HOSPITAL_COMMUNITY): Payer: Self-pay | Admitting: *Deleted

## 2015-05-30 DIAGNOSIS — D649 Anemia, unspecified: Secondary | ICD-10-CM | POA: Diagnosis present

## 2015-05-30 DIAGNOSIS — N390 Urinary tract infection, site not specified: Secondary | ICD-10-CM | POA: Diagnosis present

## 2015-05-30 DIAGNOSIS — Z7982 Long term (current) use of aspirin: Secondary | ICD-10-CM

## 2015-05-30 DIAGNOSIS — H5461 Unqualified visual loss, right eye, normal vision left eye: Secondary | ICD-10-CM | POA: Diagnosis present

## 2015-05-30 DIAGNOSIS — I10 Essential (primary) hypertension: Secondary | ICD-10-CM | POA: Diagnosis present

## 2015-05-30 DIAGNOSIS — B952 Enterococcus as the cause of diseases classified elsewhere: Secondary | ICD-10-CM | POA: Diagnosis present

## 2015-05-30 DIAGNOSIS — M810 Age-related osteoporosis without current pathological fracture: Secondary | ICD-10-CM | POA: Diagnosis present

## 2015-05-30 DIAGNOSIS — S72142A Displaced intertrochanteric fracture of left femur, initial encounter for closed fracture: Secondary | ICD-10-CM | POA: Diagnosis not present

## 2015-05-30 DIAGNOSIS — L89152 Pressure ulcer of sacral region, stage 2: Secondary | ICD-10-CM | POA: Diagnosis present

## 2015-05-30 DIAGNOSIS — F329 Major depressive disorder, single episode, unspecified: Secondary | ICD-10-CM | POA: Diagnosis present

## 2015-05-30 DIAGNOSIS — Z801 Family history of malignant neoplasm of trachea, bronchus and lung: Secondary | ICD-10-CM

## 2015-05-30 DIAGNOSIS — W1839XA Other fall on same level, initial encounter: Secondary | ICD-10-CM | POA: Diagnosis present

## 2015-05-30 DIAGNOSIS — Z90710 Acquired absence of both cervix and uterus: Secondary | ICD-10-CM

## 2015-05-30 DIAGNOSIS — R1310 Dysphagia, unspecified: Secondary | ICD-10-CM | POA: Diagnosis not present

## 2015-05-30 DIAGNOSIS — D62 Acute posthemorrhagic anemia: Secondary | ICD-10-CM | POA: Diagnosis not present

## 2015-05-30 DIAGNOSIS — R4702 Dysphasia: Secondary | ICD-10-CM | POA: Diagnosis present

## 2015-05-30 DIAGNOSIS — M25552 Pain in left hip: Secondary | ICD-10-CM | POA: Diagnosis not present

## 2015-05-30 DIAGNOSIS — E1142 Type 2 diabetes mellitus with diabetic polyneuropathy: Secondary | ICD-10-CM | POA: Diagnosis present

## 2015-05-30 DIAGNOSIS — Z8619 Personal history of other infectious and parasitic diseases: Secondary | ICD-10-CM

## 2015-05-30 DIAGNOSIS — S72009A Fracture of unspecified part of neck of unspecified femur, initial encounter for closed fracture: Secondary | ICD-10-CM

## 2015-05-30 DIAGNOSIS — Z8673 Personal history of transient ischemic attack (TIA), and cerebral infarction without residual deficits: Secondary | ICD-10-CM

## 2015-05-30 DIAGNOSIS — Z7984 Long term (current) use of oral hypoglycemic drugs: Secondary | ICD-10-CM

## 2015-05-30 DIAGNOSIS — Z833 Family history of diabetes mellitus: Secondary | ICD-10-CM

## 2015-05-30 DIAGNOSIS — E119 Type 2 diabetes mellitus without complications: Secondary | ICD-10-CM

## 2015-05-30 DIAGNOSIS — Z79899 Other long term (current) drug therapy: Secondary | ICD-10-CM

## 2015-05-30 DIAGNOSIS — Z66 Do not resuscitate: Secondary | ICD-10-CM | POA: Diagnosis present

## 2015-05-30 DIAGNOSIS — Z950 Presence of cardiac pacemaker: Secondary | ICD-10-CM

## 2015-05-30 DIAGNOSIS — K589 Irritable bowel syndrome without diarrhea: Secondary | ICD-10-CM | POA: Diagnosis present

## 2015-05-30 DIAGNOSIS — Z419 Encounter for procedure for purposes other than remedying health state, unspecified: Secondary | ICD-10-CM

## 2015-05-30 DIAGNOSIS — T148 Other injury of unspecified body region: Secondary | ICD-10-CM | POA: Diagnosis not present

## 2015-05-30 DIAGNOSIS — Y92009 Unspecified place in unspecified non-institutional (private) residence as the place of occurrence of the external cause: Secondary | ICD-10-CM

## 2015-05-30 DIAGNOSIS — Z8249 Family history of ischemic heart disease and other diseases of the circulatory system: Secondary | ICD-10-CM

## 2015-05-30 DIAGNOSIS — F0390 Unspecified dementia without behavioral disturbance: Secondary | ICD-10-CM | POA: Diagnosis present

## 2015-05-30 DIAGNOSIS — E785 Hyperlipidemia, unspecified: Secondary | ICD-10-CM | POA: Diagnosis present

## 2015-05-30 LAB — CBC WITH DIFFERENTIAL/PLATELET
Basophils Absolute: 0 10*3/uL (ref 0.0–0.1)
Basophils Relative: 0 %
Eosinophils Absolute: 0.2 10*3/uL (ref 0.0–0.7)
Eosinophils Relative: 3 %
HEMATOCRIT: 38 % (ref 36.0–46.0)
HEMOGLOBIN: 11.6 g/dL — AB (ref 12.0–15.0)
LYMPHS PCT: 17 %
Lymphs Abs: 1.6 10*3/uL (ref 0.7–4.0)
MCH: 28.6 pg (ref 26.0–34.0)
MCHC: 30.5 g/dL (ref 30.0–36.0)
MCV: 93.8 fL (ref 78.0–100.0)
MONO ABS: 0.3 10*3/uL (ref 0.1–1.0)
MONOS PCT: 3 %
NEUTROS ABS: 7.3 10*3/uL (ref 1.7–7.7)
NEUTROS PCT: 77 %
Platelets: 283 10*3/uL (ref 150–400)
RBC: 4.05 MIL/uL (ref 3.87–5.11)
RDW: 14.5 % (ref 11.5–15.5)
WBC: 9.4 10*3/uL (ref 4.0–10.5)

## 2015-05-30 MED ORDER — ACETAMINOPHEN 500 MG PO TABS
1000.0000 mg | ORAL_TABLET | Freq: Once | ORAL | Status: AC
  Administered 2015-05-31: 1000 mg via ORAL
  Filled 2015-05-30: qty 2

## 2015-05-30 MED ORDER — SODIUM CHLORIDE 0.9 % IV BOLUS (SEPSIS)
500.0000 mL | Freq: Once | INTRAVENOUS | Status: AC
  Administered 2015-05-30: 500 mL via INTRAVENOUS

## 2015-05-30 NOTE — Discharge Summary (Signed)
NAMERUTH, SLUSHER NO.:  [id number]  MEDICAL RECORD NO.:  [id number]  LOCATION:  [id number]                        FACILITY:  [facility]  PHYSICIAN:  Lauraine Rinne, P.A.  DATE OF BIRTH:  1926/11/06  DATE OF ADMISSION:  05/18/2015 DATE OF DISCHARGE:  05/28/2015                              DISCHARGE SUMMARY   DISCHARGE DIAGNOSES: 1. Right hemiparesis and gait instability, secondary to left pontine     infarct, secondary to small vessel disease. 2. SCDs for DVT prophylaxis. 3. Dementia. 4. Hypokalemia, resolved. 5. Diabetes mellitus, peripheral neuropathy. 6. Hyperlipidemia. 7. Acute on chronic anemia. 8. Hypertension.  HISTORY OF PRESENT ILLNESS:  This is an 82 year old right-handed female, with history of dementia, diabetes mellitus, complete heart block with a pacemaker.  She lives with her daughter.  Used a walker prior to admission.  Recent discharge from [facility] 3 weeks ago for pneumonia.  Presented on May 16, 2015, with right- sided weakness x1 week.  Cranial CT scan showed early left pontine infarct as well as remote basal ganglia infarcts bilaterally, remote left corona radiata infarct.  MRI not completed due to pacemaker.  She did not receive tPA.  Echocardiogram, CT angiogram of the head and neck of enlarged vessel occlusion or stenosis.  Maintained on aspirin for CVA prophylaxis.  Tolerating a regular diet.  Physical and occupational therapy ongoing.  The patient was admitted for comprehensive rehab program.  PAST MEDICAL HISTORY:  See discharge diagnoses.  SOCIAL HISTORY:  Lives with daughter and son-in-law, one assistance as needed.  She used a walker prior to admission.  Functional status upon admission to rehab services was moderate assist to ambulate 10 feet rolling walker, max assist sit to stand, min to mod assist activities of daily living.  PHYSICAL EXAMINATION:  VITAL SIGNS:  Blood pressure 173/57,  pulse 60, temperature 97, and respirations 18. GENERAL:  This was an alert, elderly female, in no acute distress.  Made good eye contact with examiner.  Followed commands, limited medical historian. LUNGS:  Clear to auscultation without wheeze. CARDIAC:  Regular rate and rhythm. ABDOMEN:  Soft, nontender.  Good bowel sounds.  REHABILITATION HOSPITAL COURSE:  The patient was admitted to inpatient rehab services with therapies initiated on a 3-hour daily basis, consisting of physical therapy, occupational therapy, speech therapy, and rehabilitation nursing.  The following issues were addressed during the patient's rehabilitation stay.  Pertaining to Mrs. Choi's left pontine infarct remained stable, maintained on aspirin therapy.  She would follow up Neurology Services.  SCDs for DVT prophylaxis.  No signs of DVT.  She continued on Aricept for history of dementia.  She was at her baseline per family, a bed alarm for safety.  Bouts of hypokalemia, resolved, with potassium supplement.  She did have a history of diabetes mellitus, peripheral neuropathy.  Hemoglobin A1c of 5.8.  She was on Glucophage prior to admission.  Her daughter had been refusing any further fingersticks, would not resume home medications at this time to avoid any hypoglycemic attacks and follow up with her primary MD.  Blood pressures again controlled, discharged on lisinopril.  The patient received weekly collaborative interdisciplinary team conferences to discuss estimated  length of stay, family teaching, any barriers at discharge.  The patient was using an assistive device rolling walker to ambulate 115, required some encouragements at times to participate. Standby assist for stand to step ambulatory transfers.  Supervision and cues to get her right leg as far as upon the bed as possible.  She could gather her belongings for simple activities of daily living and homemaking, needed some assistance for lower body  dressing.  Full family teaching was completed and plan was discharge to home.  DISCHARGE MEDICATIONS:  Included; 1. Aspirin 325 mg p.o. daily. 2. Lipitor 10 mg p.o. daily. 3. Colace 100 mg every other day. 4. Aricept 10 mg at bedtime. 5. Ferrous sulfate 325 mg daily. 6. Albuterol inhaler as needed. 7. Lisinopril 5 mg p.o. daily. 8. Artificial Tears as needed. 9. Multivitamin daily. 10.Probiotic 1 tablet daily.  DIET:  Regular consistency.  FOLLOWUP:  She would follow up with her primary MD, Dr. Charlett Blake; medical management doctor, Dr. Posey Pronto office to call for appointment.  Ongoing therapies had been arranged as per rehab services.     Lauraine Rinne, P.A.     DA/MEDQ  D:  05/29/2015  T:  05/30/2015  Job:  [id number]  cc:   Delice Lesch, MD Penni Homans, MD

## 2015-05-30 NOTE — ED Notes (Addendum)
Nurse starting IV 

## 2015-05-30 NOTE — ED Provider Notes (Signed)
CSN: [id number]     Arrival date & time 05/30/15  2230 History  By signing my name below, I, Hansel Feinstein, attest that this documentation has been prepared under the direction and in the presence of Deno Etienne, DO. Electronically Signed: Hansel Feinstein, ED Scribe. 05/30/2015. 11:38 PM.    Chief Complaint  Patient presents with  . Fall  . Hip Pain   Patient is a 82 y.o. female presenting with fall and hip pain. The history is provided by the patient and a relative. No language interpreter was used.  Fall This is a new problem. The current episode started 1 to 2 hours ago. The problem has not changed since onset.Pertinent negatives include no chest pain, no abdominal pain, no headaches and no shortness of breath. The symptoms are aggravated by walking and standing. Nothing relieves the symptoms. She has tried nothing for the symptoms. The treatment provided no relief.  Hip Pain This is a new problem. Pertinent negatives include no chest pain, no abdominal pain, no headaches and no shortness of breath. The symptoms are aggravated by walking and standing. Nothing relieves the symptoms. She has tried nothing for the symptoms. The treatment provided no relief.    HPI Comments: Briana Thornton is a 82 y.o. female with h/o type II DM, HLD, vertigo who presents to the Emergency Department after an unwitnessed fall that occurred this evening. Pt states she was walking from her bedroom to her bathroom when she fell without warning onto the carpeted floor. No known LOC or head injur, but pt reports she does not fully remember falling. She denies CP, SOB, HA prior to falling. Pt now complains of left hip and leg pain. She also complains of a persistent cough for months and has been treated for Pneumonia 3 months ago. Pt lives with her family. Family states no h/o frequent falls. She denies additional injuries, CP, abdominal pain, neck pain, arm pain.   Past Medical History  Diagnosis Date  . Chicken pox as a child  .  Measles as a child  . IBS (irritable bowel syndrome)   . Diabetes mellitus 47    type 2  . Hyperlipidemia 72  . Hypertension 72  . Vision loss of right eye   . Vertigo 2010    benign  . Calcification of cartilage     ear  . Cardiovascular system disease 07/02/2011  . Hemorrhoid 07/02/2011  . Osteoporosis 07/02/2011  . Weight loss 07/31/2011  . Anemia 05/22/2012  . Broken wrist 10-22-11    left  . Depression 05/22/2012  . Dermatitis 05/22/2012    Right neck  . DM type 2 with diabetic peripheral neuropathy ([place]) 05/18/2015   Past Surgical History  Procedure Laterality Date  . Hemorroidectomy  1979  . Knee scoped  2002    right  . Rotator cuff repair  2004    right  . Abdominal hysterectomy  2008    partial still has ovaries  . Lens implant left  2006  . Pacemaker insertion  2010  . Wrist surgery  11-05-11    left wrist  . Intramedullary (im) nail intertrochanteric Right 07/24/2013    Procedure: INTRAMEDULLARY (IM) NAIL INTERTROCHANTRIC;  Surgeon: Gearlean Alf, MD;  Location: [facility];  Service: Orthopedics;  Laterality: Right;   Family History  Problem Relation Age of Onset  . Diabetes Mother     type 2  . Hypertension Mother   . Cancer Brother     lung-smoker  . Diabetes  Daughter     pre diabetic  . Diabetes Son     type 2  . Cancer Maternal Grandfather     prostate   Social History  Substance Use Topics  . Smoking status: Never Smoker   . Smokeless tobacco: Never Used  . Alcohol Use: No   OB History    No data available     Review of Systems  Constitutional: Negative for fever and chills.  HENT: Negative for congestion and rhinorrhea.   Eyes: Negative for redness and visual disturbance.  Respiratory: Positive for cough. Negative for shortness of breath and wheezing.   Cardiovascular: Negative for chest pain and palpitations.  Gastrointestinal: Negative for nausea, vomiting and abdominal pain.  Genitourinary: Negative for dysuria and urgency.   Musculoskeletal: Positive for myalgias and arthralgias. Negative for neck pain.  Skin: Negative for pallor and wound.  Neurological: Negative for dizziness and headaches.  All other systems reviewed and are negative.  Allergies  Review of patient's allergies indicates no known allergies.  Home Medications   Prior to Admission medications   Medication Sig Start Date End Date Taking? Authorizing Provider  aspirin 325 MG tablet Take 1 tablet (325 mg total) by mouth daily. 05/26/15  Yes Ivan Anchors Love, PA-C  atorvastatin (LIPITOR) 10 MG tablet TAKE 1 TABLET (10 MG TOTAL) BY MOUTH DAILY. 04/26/15  Yes Mosie Lukes, MD  docusate sodium (COLACE) 100 MG capsule Take 100 mg by mouth every other day.    Yes Historical Provider, MD  donepezil (ARICEPT) 10 MG tablet Take 1 tablet (10 mg total) by mouth at bedtime. Patient taking differently: Take 10 mg by mouth daily.  04/26/15  Yes Mosie Lukes, MD  feeding supplement, ENSURE ENLIVE, (ENSURE ENLIVE) LIQD Take 237 mLs by mouth daily. Patient taking differently: Take 237 mLs by mouth daily as needed (nutrition).  05/18/15  Yes Albertine Patricia, MD  ferrous sulfate 325 (65 FE) MG tablet Take 1 tablet (325 mg total) by mouth daily with breakfast. 05/27/15  Yes Ivan Anchors Love, PA-C  lisinopril (PRINIVIL,ZESTRIL) 5 MG tablet Take 1 tablet (5 mg total) by mouth daily. 05/28/15  Yes Ankit Lorie Phenix, MD  metFORMIN (GLUCOPHAGE-XR) 500 MG 24 hr tablet TAKE 1 TABLET (500 MG TOTAL) BY MOUTH DAILY WITH BREAKFAST. 04/26/15  Yes Historical Provider, MD  Probiotic Product (PROBIOTIC DAILY PO) Take 1 tablet by mouth daily.   Yes Historical Provider, MD  alum & mag hydroxide-simeth (MAALOX/MYLANTA) 200-200-20 MG/5ML suspension Take 30 mLs by mouth every 6 (six) hours as needed for indigestion or heartburn (dyspepsia). 07/27/13   Kelvin Cellar, MD  Ipratropium-Albuterol (COMBIVENT RESPIMAT) 20-100 MCG/ACT AERS respimat Inhale 1 puff into the lungs every 6 (six)  hours as needed for wheezing or shortness of breath. 03/08/15   Barton Dubois, MD  methylcellulose (ARTIFICIAL TEARS) 1 % ophthalmic solution Place 1 drop into both eyes as needed (dry eyes).     Historical Provider, MD  polyethylene glycol (MIRALAX / GLYCOLAX) packet Take 17 g by mouth daily as needed for mild constipation. 07/27/13   Kelvin Cellar, MD   BP 161/56 mmHg  Pulse 61  Temp(Src) 98.2 F (36.8 C) (Axillary)  Resp 15  Ht 5\' 2"  (1.575 m)  Wt 106 lb (48.081 kg)  BMI 19.38 kg/m2  SpO2 97% Physical Exam  Constitutional: She is oriented to person, place, and time. She appears well-developed and well-nourished.  HENT:  Head: Normocephalic and atraumatic.  Eyes: Conjunctivae and EOM are normal. Pupils  are equal, round, and reactive to light.  Neck: Normal range of motion. Neck supple.  Cardiovascular: Normal rate.   Pulmonary/Chest: Effort normal and breath sounds normal. No respiratory distress. She has no wheezes. She has no rales. She exhibits no tenderness.  Lungs CTA bilaterally.   Abdominal: She exhibits no distension.  Musculoskeletal: Normal range of motion. She exhibits tenderness.  Pain with internal and external rotation of the left hip. She has no crepitus, no c-spine tenderness, no chest wall pain.   Neurological: She is alert and oriented to person, place, and time.  Skin: Skin is warm and dry.  Psychiatric: She has a normal mood and affect. Her behavior is normal.  Nursing note and vitals reviewed.   ED Course  Procedures (including critical care time) DIAGNOSTIC STUDIES: Oxygen Saturation is 100% on RA, normal by my interpretation.    COORDINATION OF CARE: 11:18 PM Discussed treatment plan with pt at bedside and pt agreed to plan. Discussed XR results with pt and family.    Labs Review Labs Reviewed  CBC WITH DIFFERENTIAL/PLATELET - Abnormal; Notable for the following:    Hemoglobin 11.6 (*)    All other components within normal limits  BASIC METABOLIC  PANEL - Abnormal; Notable for the following:    Sodium 147 (*)    Glucose, Bld 122 (*)    BUN 29 (*)    All other components within normal limits  URINALYSIS W MICROSCOPIC (NOT AT [facility]) - Abnormal; Notable for the following:    APPearance CLOUDY (*)    Hgb urine dipstick SMALL (*)    Bacteria, UA RARE (*)    Squamous Epithelial / LPF 0-5 (*)    All other components within normal limits  I-STAT CHEM 8, ED - Abnormal; Notable for the following:    Sodium 146 (*)    BUN 32 (*)    Glucose, Bld 113 (*)    All other components within normal limits  PROTIME-INR  APTT    Imaging Review Dg Chest 1 View  05/31/2015  CLINICAL DATA:  Preoperative chest radiograph for left femoral fracture. Initial encounter. EXAM: CHEST 1 VIEW COMPARISON:  Chest radiograph performed 05/16/2015 FINDINGS: The lungs are mildly hypoexpanded. No focal consolidation, pleural effusion or pneumothorax is seen. The cardiomediastinal silhouette is borderline enlarged. A pacemaker is noted overlying the left chest wall, with leads ending overlying the right atrium and right ventricle. Dense calcification is suggested at the mitral valve. No acute osseous abnormalities are identified. IMPRESSION: 1. Lungs mildly hypoexpanded but grossly clear. 2. Borderline cardiomegaly. Dense calcification suggested at the mitral valve. Electronically Signed   By: Garald Balding M.D.   On: 05/31/2015 01:18   Ct Head Wo Contrast  05/31/2015  CLINICAL DATA:  Status post fall while going from bedroom to bathroom. Concern for head injury. Initial encounter. EXAM: CT HEAD WITHOUT CONTRAST TECHNIQUE: Contiguous axial images were obtained from the base of the skull through the vertex without intravenous contrast. COMPARISON:  CT of the head performed 05/18/2015 FINDINGS: There is no evidence of acute infarction, or intra- or extra-axial hemorrhage on CT. Moderate cortical volume loss is noted, with prominence of the ventricles and sulci. Cerebellar atrophy  is seen. Relatively diffuse periventricular and subcortical white matter change likely reflects small vessel ischemic microangiopathy. Chronic lacunar infarcts are seen at the basal ganglia bilaterally. A chronic lacunar infarct is also noted at the left side of the pons. A 2.0 cm densely calcified meningioma is again noted along the inferior  aspect of the anterior falx cerebri, grossly stable in appearance. The brainstem and fourth ventricle are within normal limits. The basal ganglia are unremarkable in appearance. The cerebral hemispheres demonstrate grossly normal gray-white differentiation. No mass effect or midline shift is seen. There is no evidence of fracture; visualized osseous structures are unremarkable in appearance. The orbits are within normal limits. The paranasal sinuses and mastoid air cells are well-aerated. No significant soft tissue abnormalities are seen. IMPRESSION: 1. No acute intracranial pathology seen on CT. 2. Moderate cortical volume loss and diffuse small vessel ischemic microangiopathy. 3. Chronic lacunar infarcts at the basal ganglia bilaterally, and chronic lacunar infarct at the left side of the pons. 4. 2.0 cm densely calcified meningioma again noted along the inferior aspect of the anterior falx cerebri. Electronically Signed   By: Garald Balding M.D.   On: 05/31/2015 01:09   Dg Hip Unilat With Pelvis 2-3 Views Left  05/30/2015  CLINICAL DATA:  Status post fall, landing on left hip, with left hip shortening and pain. Initial encounter. EXAM: DG HIP (WITH OR WITHOUT PELVIS) 2-3V LEFT COMPARISON:  None. FINDINGS: There is a minimally displaced slightly comminuted left femoral intertrochanteric fracture, with mild lateral displacement of the distal femur. The left femoral head remains seated at the acetabulum. The right hip joint is unremarkable in appearance. Right femoral hardware is grossly unremarkable, though incompletely imaged. Mild degenerative change is noted at the  sacroiliac joints. Diffuse vascular calcifications are seen. The visualized bowel gas pattern is grossly unremarkable. A prominent sclerotic focus is noted at the right hemipelvis, benign in appearance. IMPRESSION: 1. Minimally displaced slightly comminuted left femoral intertrochanteric fracture, with mild lateral displacement of the distal femur. 2. Diffuse vascular calcifications seen. Electronically Signed   By: Garald Balding M.D.   On: 05/30/2015 23:33   Dg Femur Min 2 Views Left  05/31/2015  CLINICAL DATA:  Left hip pain after fall.  Initial encounter. EXAM: LEFT FEMUR 2 VIEWS COMPARISON:  None. FINDINGS: There is a mildly displaced relatively proximal left femoral intertrochanteric fracture, with sparing of the lesser femoral trochanter and mild lateral displacement. The left femoral head remains seated at the acetabulum. Diffuse vascular calcifications are seen. The knee joint is grossly unremarkable. No knee joint effusion is identified. IMPRESSION: 1. Mildly displaced relatively proximal left femoral intertrochanteric fracture, with sparing of the lesser femoral trochanter and mild lateral displacement. 2. Diffuse vascular calcifications seen. Electronically Signed   By: Garald Balding M.D.   On: 05/31/2015 01:20   I have personally reviewed and evaluated these images and lab results as part of my medical decision-making.   EKG Interpretation None      MDM   Final diagnoses:  Fracture, intertrochanteric, left femur, closed, initial encounter ([place])  Broken hip ([place])    82 yo F with cc of left hip pain post fall.  Unwitnessed, unsure of exact events due to dementia, will obtain basic labs. Xray concerning for intertrochanteric fx, discussed with Dr. Lillia Corporal, will admit to hospitalist.  I personally performed the services described in this documentation, which was scribed in my presence. The recorded information has been reviewed and is accurate.  The patients results and plan were  reviewed and discussed.   Any x-rays performed were independently reviewed by myself.   Differential diagnosis were considered with the presenting HPI.  Medications  aspirin tablet 325 mg (not administered)  ferrous sulfate tablet 325 mg (not administered)  lisinopril (PRINIVIL,ZESTRIL) tablet 5 mg (not administered)  feeding supplement (  ENSURE ENLIVE) (ENSURE ENLIVE) liquid 237 mL (not administered)  atorvastatin (LIPITOR) tablet 10 mg (not administered)  donepezil (ARICEPT) tablet 10 mg (not administered)  docusate sodium (COLACE) capsule 100 mg (not administered)  ipratropium-albuterol (DUONEB) 0.5-2.5 (3) MG/3ML nebulizer solution 3 mL (not administered)  alum & mag hydroxide-simeth (MAALOX/MYLANTA) 200-200-20 MG/5ML suspension 30 mL (not administered)  polyvinyl alcohol (LIQUIFILM TEARS) 1.4 % ophthalmic solution 1 drop (not administered)  polyethylene glycol (MIRALAX / GLYCOLAX) packet 17 g (not administered)  acidophilus (RISAQUAD) capsule 1 capsule (not administered)  heparin injection 5,000 Units (5,000 Units Subcutaneous Given 05/31/15 0552)  0.9 %  sodium chloride infusion ( Intravenous New Bag/Given 05/31/15 0323)  ondansetron (ZOFRAN) tablet 4 mg (not administered)    Or  ondansetron (ZOFRAN) injection 4 mg (not administered)  traMADol (ULTRAM) tablet 50 mg (not administered)  morphine 2 MG/ML injection 1 mg (not administered)  sodium chloride 0.9 % bolus 500 mL (0 mLs Intravenous Stopped 05/31/15 0035)  acetaminophen (TYLENOL) tablet 1,000 mg (1,000 mg Oral Given 05/31/15 0035)  morphine 2 MG/ML injection 1 mg (1 mg Intravenous Given 05/31/15 0126)    Filed Vitals:   05/31/15 0003 05/31/15 0131 05/31/15 0300 05/31/15 0549  BP: 177/70 184/55  161/56  Pulse: 59 60  61  Temp:    98.2 F (36.8 C)  TempSrc:    Axillary  Resp: 16 15  15   Height:   5\' 2"  (1.575 m)   Weight:   106 lb (48.081 kg)   SpO2: 99% 95%  97%    Final diagnoses:  Fracture, intertrochanteric, left femur,  closed, initial encounter ([place])  Broken hip ([place])    Admission/ observation were discussed with the admitting physician, patient and/or family and they are comfortable with the plan.       Deno Etienne, DO 05/31/15 [phone]

## 2015-05-30 NOTE — ED Notes (Signed)
Patient transported to X-ray 

## 2015-05-30 NOTE — ED Notes (Signed)
Bed: WA05 Expected date:  Expected time:  Means of arrival:  Comments: Fall hip pain 

## 2015-05-30 NOTE — ED Notes (Signed)
Per EMS, pt slipped and fell while walking with walker, landing on left hip. Left leg shorter and rotated outward. Pt complains of pain with movement. Pt denies loss of consciousness, head injury.

## 2015-05-31 ENCOUNTER — Encounter (HOSPITAL_COMMUNITY)
Admission: EM | Disposition: A | Discharge status: Skilled Nursing Facility | Payer: Self-pay | Source: Home / Self Care | Attending: Internal Medicine

## 2015-05-31 ENCOUNTER — Encounter (HOSPITAL_COMMUNITY): Payer: Self-pay | Admitting: *Deleted

## 2015-05-31 ENCOUNTER — Inpatient Hospital Stay (HOSPITAL_COMMUNITY): Payer: Medicare Other

## 2015-05-31 ENCOUNTER — Inpatient Hospital Stay (HOSPITAL_COMMUNITY): Payer: Medicare Other | Admitting: Anesthesiology

## 2015-05-31 ENCOUNTER — Emergency Department (HOSPITAL_COMMUNITY): Payer: Medicare Other

## 2015-05-31 DIAGNOSIS — Z66 Do not resuscitate: Secondary | ICD-10-CM | POA: Diagnosis present

## 2015-05-31 DIAGNOSIS — Z8619 Personal history of other infectious and parasitic diseases: Secondary | ICD-10-CM | POA: Diagnosis not present

## 2015-05-31 DIAGNOSIS — I10 Essential (primary) hypertension: Secondary | ICD-10-CM | POA: Diagnosis not present

## 2015-05-31 DIAGNOSIS — S72002A Fracture of unspecified part of neck of left femur, initial encounter for closed fracture: Secondary | ICD-10-CM

## 2015-05-31 DIAGNOSIS — Z8249 Family history of ischemic heart disease and other diseases of the circulatory system: Secondary | ICD-10-CM | POA: Diagnosis not present

## 2015-05-31 DIAGNOSIS — Z90710 Acquired absence of both cervix and uterus: Secondary | ICD-10-CM | POA: Diagnosis not present

## 2015-05-31 DIAGNOSIS — S72142A Displaced intertrochanteric fracture of left femur, initial encounter for closed fracture: Secondary | ICD-10-CM | POA: Diagnosis present

## 2015-05-31 DIAGNOSIS — E785 Hyperlipidemia, unspecified: Secondary | ICD-10-CM

## 2015-05-31 DIAGNOSIS — Z7984 Long term (current) use of oral hypoglycemic drugs: Secondary | ICD-10-CM | POA: Diagnosis not present

## 2015-05-31 DIAGNOSIS — R1314 Dysphagia, pharyngoesophageal phase: Secondary | ICD-10-CM | POA: Diagnosis not present

## 2015-05-31 DIAGNOSIS — R1310 Dysphagia, unspecified: Secondary | ICD-10-CM | POA: Diagnosis not present

## 2015-05-31 DIAGNOSIS — F0390 Unspecified dementia without behavioral disturbance: Secondary | ICD-10-CM | POA: Diagnosis present

## 2015-05-31 DIAGNOSIS — S72142D Displaced intertrochanteric fracture of left femur, subsequent encounter for closed fracture with routine healing: Secondary | ICD-10-CM | POA: Diagnosis not present

## 2015-05-31 DIAGNOSIS — R4702 Dysphasia: Secondary | ICD-10-CM | POA: Diagnosis present

## 2015-05-31 DIAGNOSIS — K589 Irritable bowel syndrome without diarrhea: Secondary | ICD-10-CM | POA: Diagnosis present

## 2015-05-31 DIAGNOSIS — Y92009 Unspecified place in unspecified non-institutional (private) residence as the place of occurrence of the external cause: Secondary | ICD-10-CM | POA: Diagnosis not present

## 2015-05-31 DIAGNOSIS — H5461 Unqualified visual loss, right eye, normal vision left eye: Secondary | ICD-10-CM | POA: Diagnosis present

## 2015-05-31 DIAGNOSIS — Z833 Family history of diabetes mellitus: Secondary | ICD-10-CM | POA: Diagnosis not present

## 2015-05-31 DIAGNOSIS — D649 Anemia, unspecified: Secondary | ICD-10-CM | POA: Diagnosis not present

## 2015-05-31 DIAGNOSIS — M25552 Pain in left hip: Secondary | ICD-10-CM | POA: Diagnosis present

## 2015-05-31 DIAGNOSIS — Z79899 Other long term (current) drug therapy: Secondary | ICD-10-CM | POA: Diagnosis not present

## 2015-05-31 DIAGNOSIS — Z8673 Personal history of transient ischemic attack (TIA), and cerebral infarction without residual deficits: Secondary | ICD-10-CM | POA: Diagnosis not present

## 2015-05-31 DIAGNOSIS — B952 Enterococcus as the cause of diseases classified elsewhere: Secondary | ICD-10-CM | POA: Diagnosis present

## 2015-05-31 DIAGNOSIS — E119 Type 2 diabetes mellitus without complications: Secondary | ICD-10-CM

## 2015-05-31 DIAGNOSIS — F329 Major depressive disorder, single episode, unspecified: Secondary | ICD-10-CM | POA: Diagnosis present

## 2015-05-31 DIAGNOSIS — M810 Age-related osteoporosis without current pathological fracture: Secondary | ICD-10-CM | POA: Diagnosis present

## 2015-05-31 DIAGNOSIS — S7292XA Unspecified fracture of left femur, initial encounter for closed fracture: Secondary | ICD-10-CM | POA: Insufficient documentation

## 2015-05-31 DIAGNOSIS — D62 Acute posthemorrhagic anemia: Secondary | ICD-10-CM | POA: Diagnosis not present

## 2015-05-31 DIAGNOSIS — Z950 Presence of cardiac pacemaker: Secondary | ICD-10-CM

## 2015-05-31 DIAGNOSIS — W1839XA Other fall on same level, initial encounter: Secondary | ICD-10-CM | POA: Diagnosis present

## 2015-05-31 DIAGNOSIS — N390 Urinary tract infection, site not specified: Secondary | ICD-10-CM | POA: Diagnosis not present

## 2015-05-31 DIAGNOSIS — E1142 Type 2 diabetes mellitus with diabetic polyneuropathy: Secondary | ICD-10-CM | POA: Diagnosis present

## 2015-05-31 DIAGNOSIS — Z7982 Long term (current) use of aspirin: Secondary | ICD-10-CM | POA: Diagnosis not present

## 2015-05-31 DIAGNOSIS — L89152 Pressure ulcer of sacral region, stage 2: Secondary | ICD-10-CM | POA: Diagnosis present

## 2015-05-31 DIAGNOSIS — Z801 Family history of malignant neoplasm of trachea, bronchus and lung: Secondary | ICD-10-CM | POA: Diagnosis not present

## 2015-05-31 HISTORY — PX: INTRAMEDULLARY (IM) NAIL INTERTROCHANTERIC: SHX5875

## 2015-05-31 LAB — I-STAT CHEM 8, ED
BUN: 32 mg/dL — AB (ref 6–20)
CALCIUM ION: 1.26 mmol/L (ref 1.13–1.30)
CHLORIDE: 108 mmol/L (ref 101–111)
Creatinine, Ser: 0.9 mg/dL (ref 0.44–1.00)
GLUCOSE: 113 mg/dL — AB (ref 65–99)
HCT: 39 % (ref 36.0–46.0)
Hemoglobin: 13.3 g/dL (ref 12.0–15.0)
Potassium: 3.9 mmol/L (ref 3.5–5.1)
Sodium: 146 mmol/L — ABNORMAL HIGH (ref 135–145)
TCO2: 29 mmol/L (ref 0–100)

## 2015-05-31 LAB — BASIC METABOLIC PANEL
Anion gap: 10 (ref 5–15)
BUN: 29 mg/dL — ABNORMAL HIGH (ref 6–20)
CALCIUM: 9.5 mg/dL (ref 8.9–10.3)
CO2: 29 mmol/L (ref 22–32)
CREATININE: 0.84 mg/dL (ref 0.44–1.00)
Chloride: 108 mmol/L (ref 101–111)
GFR calc Af Amer: >60 mL/min (ref >60)
GFR calc non Af Amer: >60 mL/min (ref >60)
GLUCOSE: 122 mg/dL — AB (ref 65–99)
Potassium: 4 mmol/L (ref 3.5–5.1)
Sodium: 147 mmol/L — ABNORMAL HIGH (ref 135–145)

## 2015-05-31 LAB — APTT: aPTT: 31 seconds (ref 24–37)

## 2015-05-31 LAB — URINALYSIS W MICROSCOPIC (NOT AT ARMC)
Bilirubin Urine: NEGATIVE
Glucose, UA: NEGATIVE mg/dL
Ketones, ur: NEGATIVE mg/dL
Leukocytes, UA: NEGATIVE
Nitrite: NEGATIVE
Protein, ur: NEGATIVE mg/dL
SPECIFIC GRAVITY, URINE: 1.014 (ref 1.005–1.030)
pH: 7.5 (ref 5.0–8.0)

## 2015-05-31 LAB — GLUCOSE, CAPILLARY: Glucose-Capillary: 74 mg/dL (ref 65–99)

## 2015-05-31 LAB — MRSA PCR SCREENING: MRSA BY PCR: NEGATIVE

## 2015-05-31 LAB — PROTIME-INR
INR: 1.13 (ref 0.00–1.49)
PROTHROMBIN TIME: 14.7 s (ref 11.6–15.2)

## 2015-05-31 SURGERY — FIXATION, FRACTURE, INTERTROCHANTERIC, WITH INTRAMEDULLARY ROD
Anesthesia: Monitor Anesthesia Care | Laterality: Left

## 2015-05-31 MED ORDER — PROPOFOL 10 MG/ML IV BOLUS
INTRAVENOUS | Status: AC
  Filled 2015-05-31: qty 20

## 2015-05-31 MED ORDER — ACETAMINOPHEN 500 MG PO TABS
1000.0000 mg | ORAL_TABLET | Freq: Four times a day (QID) | ORAL | Status: DC
  Administered 2015-05-31 – 2015-06-01 (×2): 1000 mg via ORAL
  Filled 2015-05-31 (×4): qty 2

## 2015-05-31 MED ORDER — CEFAZOLIN SODIUM-DEXTROSE 2-3 GM-% IV SOLR
2.0000 g | INTRAVENOUS | Status: AC
  Administered 2015-05-31: 2 g via INTRAVENOUS

## 2015-05-31 MED ORDER — ONDANSETRON HCL 4 MG PO TABS: 4.0000 mg | ORAL_TABLET | Freq: Four times a day (QID) | ORAL | Status: DC | PRN

## 2015-05-31 MED ORDER — ONDANSETRON HCL 4 MG/2ML IJ SOLN: 4.0000 mg | Freq: Four times a day (QID) | INTRAMUSCULAR | Status: DC | PRN

## 2015-05-31 MED ORDER — PROMETHAZINE HCL 25 MG/ML IJ SOLN: 6.2500 mg | INTRAMUSCULAR | Status: DC | PRN

## 2015-05-31 MED ORDER — EPHEDRINE SULFATE 50 MG/ML IJ SOLN
INTRAMUSCULAR | Status: AC
  Filled 2015-05-31: qty 1

## 2015-05-31 MED ORDER — SODIUM CHLORIDE 0.9 % IJ SOLN
INTRAMUSCULAR | Status: AC
  Filled 2015-05-31: qty 10

## 2015-05-31 MED ORDER — LIDOCAINE HCL (CARDIAC) 20 MG/ML IV SOLN
INTRAVENOUS | Status: AC
Start: 2015-05-31 — End: 2015-05-31
  Filled 2015-05-31: qty 5

## 2015-05-31 MED ORDER — ACETAMINOPHEN 325 MG PO TABS
650.0000 mg | ORAL_TABLET | Freq: Four times a day (QID) | ORAL | Status: DC | PRN
Start: 2015-05-31 — End: 2015-06-03

## 2015-05-31 MED ORDER — ASPIRIN 325 MG PO TABS
325.0000 mg | ORAL_TABLET | Freq: Every day | ORAL | Status: DC
  Filled 2015-05-31: qty 1

## 2015-05-31 MED ORDER — ENSURE ENLIVE PO LIQD
237.0000 mL | Freq: Every day | ORAL | Status: DC
  Administered 2015-06-01 – 2015-06-03 (×3): 237 mL via ORAL

## 2015-05-31 MED ORDER — 0.9 % SODIUM CHLORIDE (POUR BTL) OPTIME
TOPICAL | Status: DC | PRN
  Administered 2015-05-31: 1000 mL

## 2015-05-31 MED ORDER — LIDOCAINE HCL (CARDIAC) 20 MG/ML IV SOLN
INTRAVENOUS | Status: DC | PRN
  Administered 2015-05-31: 50 mg via INTRAVENOUS

## 2015-05-31 MED ORDER — LACTATED RINGERS IV SOLN
INTRAVENOUS | Status: DC | PRN
  Administered 2015-05-31: 15:00:00 via INTRAVENOUS

## 2015-05-31 MED ORDER — RISAQUAD PO CAPS
1.0000 | ORAL_CAPSULE | Freq: Every day | ORAL | Status: DC
  Administered 2015-06-01 – 2015-06-03 (×3): 1 via ORAL
  Filled 2015-05-31 (×4): qty 1

## 2015-05-31 MED ORDER — PROPOFOL 10 MG/ML IV BOLUS
INTRAVENOUS | Status: DC | PRN
  Administered 2015-05-31: 30 mg via INTRAVENOUS

## 2015-05-31 MED ORDER — TRAMADOL HCL 50 MG PO TABS
50.0000 mg | ORAL_TABLET | Freq: Four times a day (QID) | ORAL | Status: DC | PRN
  Administered 2015-06-01 – 2015-06-03 (×4): 50 mg via ORAL
  Filled 2015-05-31 (×4): qty 1

## 2015-05-31 MED ORDER — SODIUM CHLORIDE 0.9 % IV SOLN
INTRAVENOUS | Status: DC
  Administered 2015-05-31: 03:00:00 via INTRAVENOUS

## 2015-05-31 MED ORDER — ASPIRIN EC 325 MG PO TBEC
325.0000 mg | DELAYED_RELEASE_TABLET | Freq: Two times a day (BID) | ORAL | Status: DC
  Administered 2015-05-31 – 2015-06-03 (×7): 325 mg via ORAL
  Filled 2015-05-31 (×8): qty 1

## 2015-05-31 MED ORDER — CEFAZOLIN SODIUM-DEXTROSE 2-3 GM-% IV SOLR
2.0000 g | Freq: Four times a day (QID) | INTRAVENOUS | Status: AC
  Administered 2015-05-31 – 2015-06-01 (×2): 2 g via INTRAVENOUS
  Filled 2015-05-31 (×2): qty 50

## 2015-05-31 MED ORDER — POLYETHYLENE GLYCOL 3350 17 G PO PACK: 17.0000 g | PACK | Freq: Every day | ORAL | Status: DC | PRN

## 2015-05-31 MED ORDER — METOCLOPRAMIDE HCL 5 MG/ML IJ SOLN: 5.0000 mg | Freq: Three times a day (TID) | INTRAMUSCULAR | Status: DC | PRN

## 2015-05-31 MED ORDER — IPRATROPIUM-ALBUTEROL 0.5-2.5 (3) MG/3ML IN SOLN: 3.0000 mL | Freq: Four times a day (QID) | RESPIRATORY_TRACT | Status: DC | PRN

## 2015-05-31 MED ORDER — DONEPEZIL HCL 10 MG PO TABS
10.0000 mg | ORAL_TABLET | Freq: Every day | ORAL | Status: DC
Start: 2015-05-31 — End: 2015-06-03
  Administered 2015-05-31 – 2015-06-02 (×3): 10 mg via ORAL
  Filled 2015-05-31 (×5): qty 1

## 2015-05-31 MED ORDER — CEFAZOLIN SODIUM-DEXTROSE 2-3 GM-% IV SOLR
INTRAVENOUS | Status: AC
  Filled 2015-05-31: qty 50

## 2015-05-31 MED ORDER — ACETAMINOPHEN 650 MG RE SUPP: 650.0000 mg | Freq: Four times a day (QID) | RECTAL | Status: DC | PRN

## 2015-05-31 MED ORDER — DOCUSATE SODIUM 100 MG PO CAPS: 100.0000 mg | ORAL_CAPSULE | ORAL | Status: DC

## 2015-05-31 MED ORDER — FENTANYL CITRATE (PF) 100 MCG/2ML IJ SOLN
INTRAMUSCULAR | Status: AC
  Filled 2015-05-31: qty 2

## 2015-05-31 MED ORDER — DEXTROSE 5 % IV SOLN
1.0000 g | INTRAVENOUS | Status: DC
  Administered 2015-05-31 – 2015-06-01 (×2): 1 g via INTRAVENOUS
  Filled 2015-05-31 (×2): qty 10

## 2015-05-31 MED ORDER — ATORVASTATIN CALCIUM 10 MG PO TABS
10.0000 mg | ORAL_TABLET | Freq: Every day | ORAL | Status: DC
  Administered 2015-06-01 – 2015-06-03 (×3): 10 mg via ORAL
  Filled 2015-05-31 (×4): qty 1

## 2015-05-31 MED ORDER — ALUM & MAG HYDROXIDE-SIMETH 200-200-20 MG/5ML PO SUSP: 30.0000 mL | Freq: Four times a day (QID) | ORAL | Status: DC | PRN

## 2015-05-31 MED ORDER — PHENOL 1.4 % MT LIQD
1.0000 | OROMUCOSAL | Status: DC | PRN
  Filled 2015-05-31: qty 177

## 2015-05-31 MED ORDER — SODIUM CHLORIDE 0.9 % IV SOLN: INTRAVENOUS | Status: DC | PRN

## 2015-05-31 MED ORDER — MORPHINE SULFATE (PF) 2 MG/ML IV SOLN
0.5000 mg | INTRAVENOUS | Status: DC | PRN
  Administered 2015-05-31: 0.5 mg via INTRAVENOUS
  Filled 2015-05-31: qty 1

## 2015-05-31 MED ORDER — BUPIVACAINE IN DEXTROSE 0.75-8.25 % IT SOLN
INTRATHECAL | Status: DC | PRN
  Administered 2015-05-31: 1.8 mL via INTRATHECAL

## 2015-05-31 MED ORDER — LISINOPRIL 5 MG PO TABS
5.0000 mg | ORAL_TABLET | Freq: Every day | ORAL | Status: DC
  Administered 2015-06-01 – 2015-06-03 (×3): 5 mg via ORAL
  Filled 2015-05-31 (×4): qty 1

## 2015-05-31 MED ORDER — MENTHOL 3 MG MT LOZG: 1.0000 | LOZENGE | OROMUCOSAL | Status: DC | PRN

## 2015-05-31 MED ORDER — FENTANYL CITRATE (PF) 100 MCG/2ML IJ SOLN: 25.0000 ug | INTRAMUSCULAR | Status: DC | PRN

## 2015-05-31 MED ORDER — MORPHINE SULFATE (PF) 2 MG/ML IV SOLN
1.0000 mg | INTRAVENOUS | Status: AC
  Administered 2015-05-31: 1 mg via INTRAVENOUS
  Filled 2015-05-31: qty 1

## 2015-05-31 MED ORDER — ONDANSETRON HCL 4 MG/2ML IJ SOLN
INTRAMUSCULAR | Status: AC
  Filled 2015-05-31: qty 2

## 2015-05-31 MED ORDER — MORPHINE SULFATE (PF) 2 MG/ML IV SOLN
1.0000 mg | INTRAVENOUS | Status: DC | PRN
Start: 2015-05-31 — End: 2015-05-31
  Administered 2015-05-31: 1 mg via INTRAVENOUS
  Filled 2015-05-31: qty 1

## 2015-05-31 MED ORDER — FERROUS SULFATE 325 (65 FE) MG PO TABS
325.0000 mg | ORAL_TABLET | Freq: Every day | ORAL | Status: DC
  Filled 2015-05-31 (×4): qty 1

## 2015-05-31 MED ORDER — HEPARIN SODIUM (PORCINE) 5000 UNIT/ML IJ SOLN
5000.0000 [IU] | Freq: Three times a day (TID) | INTRAMUSCULAR | Status: DC
  Administered 2015-05-31: 5000 [IU] via SUBCUTANEOUS
  Filled 2015-05-31 (×4): qty 1

## 2015-05-31 MED ORDER — FENTANYL CITRATE (PF) 100 MCG/2ML IJ SOLN
INTRAMUSCULAR | Status: DC | PRN
  Administered 2015-05-31: 25 ug via INTRAVENOUS

## 2015-05-31 MED ORDER — ISOPROPYL ALCOHOL 70 % SOLN
Status: AC
  Filled 2015-05-31: qty 480

## 2015-05-31 MED ORDER — PROPOFOL 500 MG/50ML IV EMUL
INTRAVENOUS | Status: DC | PRN
  Administered 2015-05-31: 25 ug/kg/min via INTRAVENOUS

## 2015-05-31 MED ORDER — METOCLOPRAMIDE HCL 10 MG PO TABS: 5.0000 mg | ORAL_TABLET | Freq: Three times a day (TID) | ORAL | Status: DC | PRN

## 2015-05-31 MED ORDER — CHLORHEXIDINE GLUCONATE CLOTH 2 % EX PADS: 6.0000 | MEDICATED_PAD | Freq: Every day | CUTANEOUS | Status: DC

## 2015-05-31 MED ORDER — POLYVINYL ALCOHOL 1.4 % OP SOLN
1.0000 [drp] | OPHTHALMIC | Status: DC | PRN
  Filled 2015-05-31: qty 15

## 2015-05-31 MED ORDER — ONDANSETRON HCL 4 MG/2ML IJ SOLN
INTRAMUSCULAR | Status: DC | PRN
Start: 2015-05-31 — End: 2015-05-31
  Administered 2015-05-31: 4 mg via INTRAVENOUS

## 2015-05-31 SURGICAL SUPPLY — 43 items
BAG ZIPLOCK 12X15 (MISCELLANEOUS) IMPLANT
BIT DRILL CALIBRATED 4.2 (BIT) ×1 IMPLANT
BLADE HELICAL TFNA 90 HIP (Anchor) ×2 IMPLANT
BLADE HELICAL TFNA 90MM HIP (Anchor) ×1 IMPLANT
BLADE HELICAL TFNA 95 STRL (Anchor) ×2 IMPLANT
BLADE HELICAL TFNA 95MM STRL (Anchor) ×1 IMPLANT
CHLORAPREP W/TINT 26ML (MISCELLANEOUS) ×3 IMPLANT
COVER PERINEAL POST (MISCELLANEOUS) ×3 IMPLANT
DRAPE C-ARM 42X120 X-RAY (DRAPES) ×3 IMPLANT
DRAPE C-ARMOR (DRAPES) ×3 IMPLANT
DRAPE ORTHO 2.5IN SPLIT 77X108 (DRAPES) ×2 IMPLANT
DRAPE ORTHO SPLIT 77X108 STRL (DRAPES) ×4
DRAPE SHEET LG 3/4 BI-LAMINATE (DRAPES) ×6 IMPLANT
DRAPE STERI IOBAN 125X83 (DRAPES) ×3 IMPLANT
DRAPE U-SHAPE 47X51 STRL (DRAPES) ×6 IMPLANT
DRILL BIT CALIBRATED 4.2 (BIT) ×3
DRSG MEPILEX BORDER 4X4 (GAUZE/BANDAGES/DRESSINGS) ×3 IMPLANT
DRSG MEPILEX BORDER 4X8 (GAUZE/BANDAGES/DRESSINGS) ×3 IMPLANT
FACESHIELD WRAPAROUND (MASK) ×6 IMPLANT
GAUZE SPONGE 4X4 12PLY STRL (GAUZE/BANDAGES/DRESSINGS) ×3 IMPLANT
GLOVE BIO SURGEON STRL SZ7.5 (GLOVE) ×3 IMPLANT
GLOVE BIO SURGEON STRL SZ8.5 (GLOVE) ×6 IMPLANT
GLOVE BIOGEL PI IND STRL 6.5 (GLOVE) ×1 IMPLANT
GLOVE BIOGEL PI IND STRL 8.5 (GLOVE) ×1 IMPLANT
GLOVE BIOGEL PI INDICATOR 6.5 (GLOVE) ×2
GLOVE BIOGEL PI INDICATOR 8.5 (GLOVE) ×2
GLOVE ECLIPSE 6.5 STRL STRAW (GLOVE) ×3 IMPLANT
GOWN SPEC L3 XXLG W/TWL (GOWN DISPOSABLE) ×3 IMPLANT
GOWN STRL REUS W/ TWL XL LVL3 (GOWN DISPOSABLE) ×1 IMPLANT
GOWN STRL REUS W/TWL XL LVL3 (GOWN DISPOSABLE) ×2
GUIDEWIRE 3.2X400 (WIRE) ×6 IMPLANT
KIT BASIN OR (CUSTOM PROCEDURE TRAY) ×3 IMPLANT
LIQUID BAND (GAUZE/BANDAGES/DRESSINGS) ×3 IMPLANT
MANIFOLD NEPTUNE II (INSTRUMENTS) ×3 IMPLANT
MARKER SKIN DUAL TIP RULER LAB (MISCELLANEOUS) ×3 IMPLANT
NAIL CANN TFNA DEG 11X170-125 (Nail) ×3 IMPLANT
PACK GENERAL/GYN (CUSTOM PROCEDURE TRAY) ×3 IMPLANT
SCREW LOCKING 5.0X34MM (Screw) ×3 IMPLANT
SUT MNCRL AB 3-0 PS2 18 (SUTURE) ×3 IMPLANT
SUT MNCRL AB 4-0 PS2 18 (SUTURE) ×3 IMPLANT
SUT MON AB 2-0 CT1 27 (SUTURE) ×3 IMPLANT
SUT VIC AB 1 CT1 36 (SUTURE) ×3 IMPLANT
YANKAUER SUCT BULB TIP NO VENT (SUCTIONS) ×3 IMPLANT

## 2015-05-31 NOTE — Interval H&P Note (Signed)
History and Physical Interval Note:  05/31/2015 3:17 PM  Briana Thornton  has presented today for surgery, with the diagnosis of LEFT INTERTROCHANTERIC FRACTURE  The various methods of treatment have been discussed with the patient and family. After consideration of risks, benefits and other options for treatment, the patient has consented to  Procedure(s): INTRAMEDULLARY (IM) NAIL INTERTROCHANTRIC (Left) as a surgical intervention .  The patient's history has been reviewed, patient examined, no change in status, stable for surgery.  I have reviewed the patient's chart and labs.  Questions were answered to the patient's satisfaction.     Yasmeen Manka, Horald Pollen

## 2015-05-31 NOTE — NC FL2 (Deleted)
[place] LEVEL OF CARE SCREENING TOOL     IDENTIFICATION  Patient Name: Briana Thornton Birthdate: 10/17/1926 Sex: female Admission Date (Current Location): 05/30/2015  [facility] and [place] Number:  Herbalist and Address:  [facility],  [place] [address] [place]      Provider Number: [id number]  Attending Physician Name and Address:  Oswald Hillock, MD  Relative Name and Phone Number:       Current Level of Care: Hospital Recommended Level of Care: [place] Prior Approval Number:    Date Approved/Denied:   PASRR Number: [id number]  Discharge Plan: SNF    Current Diagnoses: Patient Active Problem List   Diagnosis Date Noted  . Femur fracture, left ([place]) 05/31/2015  . Absolute anemia   . Labile blood pressure   . Hypoalbuminemia due to protein-calorie malnutrition ([place])   . Malnutrition of moderate degree 05/18/2015  . TIA (transient ischemic attack) 05/18/2015  . Left pontine cerebrovascular accident ([place]) 05/18/2015  . DM type 2 with diabetic peripheral neuropathy ([place]) 05/18/2015  . CAP (community acquired pneumonia) 03/05/2015  . SOB (shortness of breath) 03/05/2015  . Hypoxia 03/05/2015  . Weakness 03/05/2015  . Dehydration, mild 03/05/2015  . Controlled type 2 diabetes mellitus with diabetic nephropathy, without long-term current use of insulin ([place])   . Onychomycosis 10/31/2014  . UTI symptoms 01/25/2014  . Dysphagia 01/25/2014  . Abnormal finding on urinalysis 01/25/2014  . Dementia without behavioral disturbance 08/06/2013  . Postoperative anemia due to acute blood loss 07/26/2013  . Hip fracture ([place]) 07/24/2013  . Thyroid mass of unclear etiology 07/24/2013  . Fall at home 07/24/2013  . Cerumen impaction 04/21/2013  . Cognitive decline 02/15/2013  . Anemia 05/22/2012  . Depression 05/22/2012  . Dermatitis 05/22/2012  . Broken wrist   . Pacemaker-[employer] 09/18/2011  . URI (upper  respiratory infection) 08/31/2011  . Lymph node enlargement 08/31/2011  . Weight loss 07/31/2011  . Cardiovascular system disease 07/02/2011  . Hemorrhoid 07/02/2011  . Osteoporosis 07/02/2011  . Essential hypertension   . Hyperlipidemia   . IBS (irritable bowel syndrome)   . DM (diabetes mellitus) ([place])   . Calcification of cartilage   . BPPV (benign paroxysmal positional vertigo)   . Vision loss of right eye     Orientation RESPIRATION BLADDER Height & Weight    Place, Self  Normal Continent 5\' 2"  (157.5 cm) 106 lbs.  BEHAVIORAL SYMPTOMS/MOOD NEUROLOGICAL BOWEL NUTRITION STATUS  Other (Comment) (no behaviors)   Continent Diet  AMBULATORY STATUS COMMUNICATION OF NEEDS Skin   Extensive Assist Verbally Surgical wounds, Other (Comment) (stage 2 ulcer on sacrum)                       Personal Care Assistance Level of Assistance  Bathing, Feeding, Dressing Bathing Assistance: Limited assistance Feeding assistance: Independent Dressing Assistance: Limited assistance     Functional Limitations Info  Sight, Hearing, Speech Sight Info: Adequate Hearing Info: Adequate Speech Info: Adequate    SPECIAL CARE FACTORS FREQUENCY  PT (By licensed PT), OT (By licensed OT)     PT Frequency: 5 x wk OT Frequency: 5 x wk            Contractures Contractures Info: Not present    Additional Factors Info  Code Status Code Status Info: Full Code             Current Medications (05/31/2015):  This is the current hospital  active medication list Current Facility-Administered Medications  Medication Dose Route Frequency Provider Last Rate Last Dose  . 0.9 %  sodium chloride infusion   Intravenous Continuous Norval Morton, MD 75 mL/hr at 05/31/15 0323    . acidophilus (RISAQUAD) capsule 1 capsule  1 capsule Oral Daily Norval Morton, MD   1 capsule at 05/31/15 1000  . alum & mag hydroxide-simeth (MAALOX/MYLANTA) 200-200-20 MG/5ML suspension 30 mL  30 mL Oral Q6H PRN Norval Morton, MD      . aspirin tablet 325 mg  325 mg Oral Daily Norval Morton, MD   325 mg at 05/31/15 1000  . atorvastatin (LIPITOR) tablet 10 mg  10 mg Oral q1800 Rondell A Tamala Julian, MD      . ceFAZolin (ANCEF) IVPB 2 g/50 mL premix  2 g Intravenous 30 min Pre-Op Rod Can, MD      . cefTRIAXone (ROCEPHIN) 1 g in dextrose 5 % 50 mL IVPB  1 g Intravenous Q24H Oswald Hillock, MD   1 g at 05/31/15 0848  . docusate sodium (COLACE) capsule 100 mg  100 mg Oral QODAY Rondell A Tamala Julian, MD   100 mg at 05/31/15 1000  . donepezil (ARICEPT) tablet 10 mg  10 mg Oral QHS Rondell A Smith, MD      . feeding supplement (ENSURE ENLIVE) (ENSURE ENLIVE) liquid 237 mL  237 mL Oral Daily Norval Morton, MD   237 mL at 05/31/15 1000  . ferrous sulfate tablet 325 mg  325 mg Oral Q breakfast Norval Morton, MD   325 mg at 05/31/15 1000  . heparin injection 5,000 Units  5,000 Units Subcutaneous 3 times per day Norval Morton, MD   5,000 Units at 05/31/15 0552  . ipratropium-albuterol (DUONEB) 0.5-2.5 (3) MG/3ML nebulizer solution 3 mL  3 mL Inhalation Q6H PRN Norval Morton, MD      . lisinopril (PRINIVIL,ZESTRIL) tablet 5 mg  5 mg Oral Daily Rondell Charmayne Sheer, MD   5 mg at 05/31/15 1000  . morphine 2 MG/ML injection 1 mg  1 mg Intravenous Q2H PRN Norval Morton, MD      . ondansetron (ZOFRAN) tablet 4 mg  4 mg Oral Q6H PRN Norval Morton, MD       Or  . ondansetron (ZOFRAN) injection 4 mg  4 mg Intravenous Q6H PRN Rondell A Tamala Julian, MD      . polyethylene glycol (MIRALAX / GLYCOLAX) packet 17 g  17 g Oral Daily PRN Norval Morton, MD      . polyvinyl alcohol (LIQUIFILM TEARS) 1.4 % ophthalmic solution 1 drop  1 drop Both Eyes PRN Rondell A Tamala Julian, MD      . traMADol (ULTRAM) tablet 50 mg  50 mg Oral Q6H PRN Norval Morton, MD         Discharge Medications: Please see discharge summary for a list of discharge medications.  Relevant Imaging Results:  Relevant Lab Results:   Additional Information  SS #  [id number] Earnie Rockhold, Randall An, LCSW

## 2015-05-31 NOTE — Consult Note (Signed)
WOC wound consult note Reason for Consult: Stage 2 PrI on sacrum (POA), also dry, flaking skin on right heel Wound type:Pressure, friction Pressure Ulcer POA: Yes Measurement: 2cm x 1cm x 0.2cm (sacrum).  Right heel with friction injury measuring 1cm x 2cm x 0.2cm on the medial aspect Wound [id number], moist (Partial thickness) Drainage (amount, consistency, odor) scant serous Periwound:clear, intact Dressing procedure/placement/frequency: I will place a soft silicone foam dressing to the sacrum and float the bilateral heels.  Guidance is provided for nursing to use house moisturizer twice daily. Turning and repositioning is in place. [place] nursing team will not follow, but will remain available to this patient, the nursing and medical teams.  Please re-consult if needed. Thanks, Maudie Flakes, MSN, RN, [place], Arther Abbott  Pager# [phone]

## 2015-05-31 NOTE — Anesthesia Procedure Notes (Addendum)
Spinal Patient location during procedure: OR Staffing Anesthesiologist: Suzette Battiest Performed by: anesthesiologist  Preanesthetic Checklist Completed: patient identified, site marked, surgical consent, pre-op evaluation, timeout performed, IV checked, risks and benefits discussed and monitors and equipment checked Spinal Block Patient position: sitting Prep: Betadine Patient monitoring: heart rate, continuous pulse ox and blood pressure Approach: right paramedian Location: L4-5 Injection technique: single-shot Needle Needle type: Quincke  Needle gauge: 22 G Needle length: 9 cm Additional Notes Expiration date of kit checked and confirmed. Patient tolerated procedure well, without complications.    Procedure Name: MAC Date/Time: 05/31/2015 3:30 PM Performed by: Carleene Cooper A Pre-anesthesia Checklist: Patient identified, Timeout performed, Emergency Drugs available, Suction available and Patient being monitored Patient Re-evaluated:Patient Re-evaluated prior to inductionOxygen Delivery Method: Simple face mask Dental Injury: Teeth and Oropharynx as per pre-operative assessment

## 2015-05-31 NOTE — Discharge Instructions (Signed)
 Dr. Salsabeel Gorelick Adult Hip & Knee Specialist [facility] [address] [place], [place] [address] [phone]   POSTOPERATIVE DIRECTIONS    Hip Rehabilitation, Guidelines Following Surgery   WEIGHT BEARING Weight bearing as tolerated with assist device (walker, cane, etc) as directed, use it as long as suggested by your surgeon or therapist, typically at least 4-6 weeks.   HOME CARE INSTRUCTIONS  Remove items at home which could result in a fall. This includes throw rugs or furniture in walking pathways.  Continue medications as instructed at time of discharge.  You may have some home medications which will be placed on hold until you complete the course of blood thinner medication.  4 days after discharge, you may start showering. No tub baths or soaking your incisions. Do not put on socks or shoes without following the instructions of your caregivers.   Sit on chairs with arms. Use the chair arms to help push yourself up when arising.  Arrange for the use of a toilet seat elevator so you are not sitting low.   Walk with walker as instructed.  You may resume a sexual relationship in one month or when given the OK by your caregiver.  Use walker as long as suggested by your caregivers.  Avoid periods of inactivity such as sitting longer than an hour when not asleep. This helps prevent blood clots.  You may return to work once you are cleared by your surgeon.  Do not drive a car for 6 weeks or until released by your surgeon.  Do not drive while taking narcotics.  Wear elastic stockings for two weeks following surgery during the day but you may remove then at night.  Make sure you keep all of your appointments after your operation with all of your doctors and caregivers. You should call the office at the above phone number and make an appointment for approximately two weeks after the date of your surgery. Please pick up a stool softener and laxative  for home use as long as you are requiring pain medications.  ICE to the affected hip every three hours for 30 minutes at a time and then as needed for pain and swelling. Continue to use ice on the hip for pain and swelling from surgery. You may notice swelling that will progress down to the foot and ankle.  This is normal after surgery.  Elevate the leg when you are not up walking on it.   It is important for you to complete the blood thinner medication as prescribed by your doctor.  Continue to use the breathing machine which will help keep your temperature down.  It is common for your temperature to cycle up and down following surgery, especially at night when you are not up moving around and exerting yourself.  The breathing machine keeps your lungs expanded and your temperature down.  RANGE OF MOTION AND STRENGTHENING EXERCISES  These exercises are designed to help you keep full movement of your hip joint. Follow your caregiver's or physical therapist's instructions. Perform all exercises about fifteen times, three times per day or as directed. Exercise both hips, even if you have had only one joint replacement. These exercises can be done on a training (exercise) mat, on the floor, on a table or on a bed. Use whatever works the best and is most comfortable for you. Use music or television while you are exercising so that the exercises are a pleasant break in your day. This   will make your life better with the exercises acting as a break in routine you can look forward to.  Lying on your back, slowly slide your foot toward your buttocks, raising your knee up off the floor. Then slowly slide your foot back down until your leg is straight again.  Lying on your back spread your legs as far apart as you can without causing discomfort.  Lying on your side, raise your upper leg and foot straight up from the floor as far as is comfortable. Slowly lower the leg and repeat.  Lying on your back, tighten up the  muscle in the front of your thigh (quadriceps muscles). You can do this by keeping your leg straight and trying to raise your heel off the floor. This helps strengthen the largest muscle supporting your knee.  Lying on your back, tighten up the muscles of your buttocks both with the legs straight and with the knee bent at a comfortable angle while keeping your heel on the floor.   SKILLED REHAB INSTRUCTIONS: If the patient is transferred to a skilled rehab facility following release from the hospital, a list of the current medications will be sent to the facility for the patient to continue.  When discharged from the skilled rehab facility, please have the facility set up the patient's Home Health Physical Therapy prior to being released. Also, the skilled facility will be responsible for providing the patient with their medications at time of release from the facility to include their pain medication and their blood thinner medication. If the patient is still at the rehab facility at time of the two week follow up appointment, the skilled rehab facility will also need to assist the patient in arranging follow up appointment in our office and any transportation needs.  MAKE SURE YOU:  Understand these instructions.  Will watch your condition.  Will get help right away if you are not doing well or get worse.  Pick up stool softner and laxative for home use following surgery while on pain medications. Daily dry dressing changes as needed. In 4 days, you may remove your dressings and begin taking showers - no tub baths or soaking the incisions. Continue to use ice for pain and swelling after surgery. Do not use any lotions or creams on the incision until instructed by your surgeon.   

## 2015-05-31 NOTE — Clinical Social Work Note (Signed)
Clinical Social Work Assessment  Patient Details  Name: Briana Thornton MRN: [id number] Date of Birth: 12/24/1926  Date of referral:  05/31/15               Reason for consult:  Facility Placement, Discharge Planning                Permission sought to share information with:  Chartered certified accountant granted to share information::  Yes, Verbal Permission Granted  Name::        Agency::     Relationship::     Contact Information:     Housing/Transportation Living arrangements for the past 2 months:  Single Family Home Source of Information:  Adult Children Patient Interpreter Needed:  None Criminal Activity/Legal Involvement Pertinent to Current Situation/Hospitalization:  No - Comment as needed Significant Relationships:  Adult Children, Other Family Members Lives with:  Adult Children Do you feel safe going back to the place where you live?   (SNF placement most likely needed.) Need for family participation in patient care:  Yes (Comment)  Care giving concerns:  Pt's care cannot be managed at ome following hospital d/c.   Social Worker assessment / plan:  Pt hospitalized on 05/31/15 with a left hip fx which will require surgery. Pt recently was hospitalized at [facility] and had rehab at [facility] from 12/21-12/31 for a [place] Infact and Right Hemiparesis with gait instability. Pt as dementia and is oriented x2. CSW met with pt's daughter, [name], [phone], to assist with d/c planning. Pt was sleeping during our visit. Pt will most likely need ST Rehab following surgery. Daughter  reports that pt had been to [facility] in November 2016 and would be interested in placement there at d/c. CSW will intiate SNF search and contact [facility] to check availability. Pt's surgery is scheduled for today. CSW will continue to follow to assist with d/c planning needs.  Employment status:  Retired Nurse, adult PT Recommendations:  Not assessed at this  time Information / Referral to community resources:  [place]  Patient/Family's Response to care:  Daughter agrees with plan for [employer] if recommended.  Patient/Family's Understanding of and Emotional Response to Diagnosis, Current Treatment, and Prognosis:  Daughter is aware of pt's medical status and pending surgery. Daughter is discussing the possible need for 24/7 supervision  ( either at home or at [facility] ) following rehab with other family members. On going support will be provided.  Emotional Assessment Appearance:  Appears stated age Attitude/Demeanor/Rapport:  Unable to Assess Affect (typically observed):  Unable to Assess Orientation:  Oriented to Self, Oriented to Place Alcohol / Substance use:  Not Applicable Psych involvement (Current and /or in the community):  No (Comment)  Discharge Needs  Concerns to be addressed:  Discharge Planning Concerns Readmission within the last 30 days:  No Current discharge risk:  None Barriers to Discharge:  No Barriers Identified   Luretha Rued, [place] 05/31/2015, 11:51 AM

## 2015-05-31 NOTE — Transfer of Care (Signed)
Immediate Anesthesia Transfer of Care Note  Patient: Briana Thornton  Procedure(s) Performed: Procedure(s): INTRAMEDULLARY (IM) NAIL INTERTROCHANTRIC (Left)  Patient Location: PACU  Anesthesia Type:MAC and Spinal  Level of Consciousness: awake, alert , oriented and patient cooperative  Airway & Oxygen Therapy: Patient Spontanous Breathing and Patient connected to face mask oxygen  Post-op Assessment: Report given to RN and Post -op Vital signs reviewed and stable  Post vital signs: Reviewed and stable  Last Vitals:  Filed Vitals:   05/31/15 0131 05/31/15 0549  BP: 184/55 161/56  Pulse: 60 61  Temp:  36.8 C  Resp: 15 15    Complications: No apparent anesthesia complications

## 2015-05-31 NOTE — Anesthesia Postprocedure Evaluation (Signed)
Anesthesia Post Note  Patient: Briana Thornton  Procedure(s) Performed: Procedure(s) (LRB): INTRAMEDULLARY (IM) NAIL INTERTROCHANTRIC (Left)  Patient location during evaluation: PACU Anesthesia Type: Spinal and MAC Level of consciousness: awake and alert Pain management: pain level controlled Vital Signs Assessment: post-procedure vital signs reviewed and stable Respiratory status: spontaneous breathing Cardiovascular status: blood pressure returned to baseline Anesthetic complications: no    Last Vitals:  Filed Vitals:   05/31/15 1943 05/31/15 2118  BP: 129/41 104/63  Pulse:    Temp: 36.3 C 36.8 C  Resp: 14 16    Last Pain:  Filed Vitals:   05/31/15 2123  PainSc: Tyler Deis

## 2015-05-31 NOTE — ED Notes (Signed)
Patient transported to CT 

## 2015-05-31 NOTE — H&P (View-Only) (Signed)
ORTHOPAEDIC CONSULTATION  REQUESTING PHYSICIAN: Oswald Hillock, MD  PCP:  Penni Homans, MD  Chief Complaint: left hip pain  HPI: Briana Thornton is a 82 y.o. female who complains of left hip pain and inability to [facility] after a fall yesterday while ambulating to bathroom. Normally a household ambulator with a walker since sustaining right hip fracture 2 years ago. Had a recent stroke.  Past Medical History  Diagnosis Date  . Chicken pox as a child  . Measles as a child  . IBS (irritable bowel syndrome)   . Diabetes mellitus 47    type 2  . Hyperlipidemia 72  . Hypertension 72  . Vision loss of right eye   . Vertigo 2010    benign  . Calcification of cartilage     ear  . Cardiovascular system disease 07/02/2011  . Hemorrhoid 07/02/2011  . Osteoporosis 07/02/2011  . Weight loss 07/31/2011  . Anemia 05/22/2012  . Broken wrist 10-22-11    left  . Depression 05/22/2012  . Dermatitis 05/22/2012    Right neck  . DM type 2 with diabetic peripheral neuropathy ([place]) 05/18/2015   Past Surgical History  Procedure Laterality Date  . Hemorroidectomy  1979  . Knee scoped  2002    right  . Rotator cuff repair  2004    right  . Abdominal hysterectomy  2008    partial still has ovaries  . Lens implant left  2006  . Pacemaker insertion  2010  . Wrist surgery  11-05-11    left wrist  . Intramedullary (im) nail intertrochanteric Right 07/24/2013    Procedure: INTRAMEDULLARY (IM) NAIL INTERTROCHANTRIC;  Surgeon: Gearlean Alf, MD;  Location: [facility] ORS;  Service: Orthopedics;  Laterality: Right;   Social History   Social History  . Marital Status: Widowed    Spouse Name: N/A  . Number of Children: N/A  . Years of Education: N/A   Social History Main Topics  . Smoking status: Never Smoker   . Smokeless tobacco: Never Used  . Alcohol Use: No  . Drug Use: No  . Sexual Activity: No   Other Topics Concern  . None   Social History Narrative   Family History  Problem Relation Age of  Onset  . Diabetes Mother     type 2  . Hypertension Mother   . Cancer Brother     lung-smoker  . Diabetes Daughter     pre diabetic  . Diabetes Son     type 2  . Cancer Maternal Grandfather     prostate   No Known Allergies Prior to Admission medications   Medication Sig Start Date End Date Taking? Authorizing Provider  aspirin 325 MG tablet Take 1 tablet (325 mg total) by mouth daily. 05/26/15  Yes Ivan Anchors Love, PA-C  atorvastatin (LIPITOR) 10 MG tablet TAKE 1 TABLET (10 MG TOTAL) BY MOUTH DAILY. 04/26/15  Yes Mosie Lukes, MD  docusate sodium (COLACE) 100 MG capsule Take 100 mg by mouth every other day.    Yes Historical Provider, MD  donepezil (ARICEPT) 10 MG tablet Take 1 tablet (10 mg total) by mouth at bedtime. Patient taking differently: Take 10 mg by mouth daily.  04/26/15  Yes Mosie Lukes, MD  feeding supplement, ENSURE ENLIVE, (ENSURE ENLIVE) LIQD Take 237 mLs by mouth daily. Patient taking differently: Take 237 mLs by mouth daily as needed (nutrition).  05/18/15  Yes Albertine Patricia, MD  ferrous sulfate 325 (65 FE)  MG tablet Take 1 tablet (325 mg total) by mouth daily with breakfast. 05/27/15  Yes Ivan Anchors Love, PA-C  lisinopril (PRINIVIL,ZESTRIL) 5 MG tablet Take 1 tablet (5 mg total) by mouth daily. 05/28/15  Yes Ankit Lorie Phenix, MD  metFORMIN (GLUCOPHAGE-XR) 500 MG 24 hr tablet TAKE 1 TABLET (500 MG TOTAL) BY MOUTH DAILY WITH BREAKFAST. 04/26/15  Yes Historical Provider, MD  Probiotic Product (PROBIOTIC DAILY PO) Take 1 tablet by mouth daily.   Yes Historical Provider, MD  alum & mag hydroxide-simeth (MAALOX/MYLANTA) 200-200-20 MG/5ML suspension Take 30 mLs by mouth every 6 (six) hours as needed for indigestion or heartburn (dyspepsia). 07/27/13   Kelvin Cellar, MD  Ipratropium-Albuterol (COMBIVENT RESPIMAT) 20-100 MCG/ACT AERS respimat Inhale 1 puff into the lungs every 6 (six) hours as needed for wheezing or shortness of breath. 03/08/15   Barton Dubois, MD    methylcellulose (ARTIFICIAL TEARS) 1 % ophthalmic solution Place 1 drop into both eyes as needed (dry eyes).     Historical Provider, MD  polyethylene glycol (MIRALAX / GLYCOLAX) packet Take 17 g by mouth daily as needed for mild constipation. 07/27/13   Kelvin Cellar, MD   Dg Chest 1 View  05/31/2015  CLINICAL DATA:  Preoperative chest radiograph for left femoral fracture. Initial encounter. EXAM: CHEST 1 VIEW COMPARISON:  Chest radiograph performed 05/16/2015 FINDINGS: The lungs are mildly hypoexpanded. No focal consolidation, pleural effusion or pneumothorax is seen. The cardiomediastinal silhouette is borderline enlarged. A pacemaker is noted overlying the left chest wall, with leads ending overlying the right atrium and right ventricle. Dense calcification is suggested at the mitral valve. No acute osseous abnormalities are identified. IMPRESSION: 1. Lungs mildly hypoexpanded but grossly clear. 2. Borderline cardiomegaly. Dense calcification suggested at the mitral valve. Electronically Signed   By: Garald Balding M.D.   On: 05/31/2015 01:18   Ct Head Wo Contrast  05/31/2015  CLINICAL DATA:  Status post fall while going from bedroom to bathroom. Concern for head injury. Initial encounter. EXAM: CT HEAD WITHOUT CONTRAST TECHNIQUE: Contiguous axial images were obtained from the base of the skull through the vertex without intravenous contrast. COMPARISON:  CT of the head performed 05/18/2015 FINDINGS: There is no evidence of acute infarction, or intra- or extra-axial hemorrhage on CT. Moderate cortical volume loss is noted, with prominence of the ventricles and sulci. Cerebellar atrophy is seen. Relatively diffuse periventricular and subcortical white matter change likely reflects small vessel ischemic microangiopathy. Chronic lacunar infarcts are seen at the basal ganglia bilaterally. A chronic lacunar infarct is also noted at the left side of the pons. A 2.0 cm densely calcified meningioma is again noted  along the inferior aspect of the anterior falx cerebri, grossly stable in appearance. The brainstem and fourth ventricle are within normal limits. The basal ganglia are unremarkable in appearance. The cerebral hemispheres demonstrate grossly normal gray-white differentiation. No mass effect or midline shift is seen. There is no evidence of fracture; visualized osseous structures are unremarkable in appearance. The orbits are within normal limits. The paranasal sinuses and mastoid air cells are well-aerated. No significant soft tissue abnormalities are seen. IMPRESSION: 1. No acute intracranial pathology seen on CT. 2. Moderate cortical volume loss and diffuse small vessel ischemic microangiopathy. 3. Chronic lacunar infarcts at the basal ganglia bilaterally, and chronic lacunar infarct at the left side of the pons. 4. 2.0 cm densely calcified meningioma again noted along the inferior aspect of the anterior falx cerebri. Electronically Signed   By: Francoise Schaumann.D.  On: 05/31/2015 01:09   Dg Hip Unilat With Pelvis 2-3 Views Left  05/30/2015  CLINICAL DATA:  Status post fall, landing on left hip, with left hip shortening and pain. Initial encounter. EXAM: DG HIP (WITH OR WITHOUT PELVIS) 2-3V LEFT COMPARISON:  None. FINDINGS: There is a minimally displaced slightly comminuted left femoral intertrochanteric fracture, with mild lateral displacement of the distal femur. The left femoral head remains seated at the acetabulum. The right hip joint is unremarkable in appearance. Right femoral hardware is grossly unremarkable, though incompletely imaged. Mild degenerative change is noted at the sacroiliac joints. Diffuse vascular calcifications are seen. The visualized bowel gas pattern is grossly unremarkable. A prominent sclerotic focus is noted at the right hemipelvis, benign in appearance. IMPRESSION: 1. Minimally displaced slightly comminuted left femoral intertrochanteric fracture, with mild lateral displacement of  the distal femur. 2. Diffuse vascular calcifications seen. Electronically Signed   By: Garald Balding M.D.   On: 05/30/2015 23:33   Dg Femur Min 2 Views Left  05/31/2015  CLINICAL DATA:  Left hip pain after fall.  Initial encounter. EXAM: LEFT FEMUR 2 VIEWS COMPARISON:  None. FINDINGS: There is a mildly displaced relatively proximal left femoral intertrochanteric fracture, with sparing of the lesser femoral trochanter and mild lateral displacement. The left femoral head remains seated at the acetabulum. Diffuse vascular calcifications are seen. The knee joint is grossly unremarkable. No knee joint effusion is identified. IMPRESSION: 1. Mildly displaced relatively proximal left femoral intertrochanteric fracture, with sparing of the lesser femoral trochanter and mild lateral displacement. 2. Diffuse vascular calcifications seen. Electronically Signed   By: Garald Balding M.D.   On: 05/31/2015 01:20    Positive ROS: All other systems have been reviewed and were otherwise negative with the exception of those mentioned in the HPI and as above.  Physical Exam: General: Alert, no acute distress Cardiovascular: No pedal edema Respiratory: No cyanosis, no use of accessory musculature GI: No organomegaly, abdomen is soft and non-tender Skin: No lesions in the area of chief complaint Neurologic: Sensation intact distally Psychiatric: Patient is competent for consent with normal mood and affect Lymphatic: No axillary or cervical lymphadenopathy  MUSCULOSKELETAL: LLE is shortened and externally rotated. Pain with logroll. (+) TA/GS/EHL. Subjective sensory change due to neuropathy. 2+ DP.  Assessment: L IT femur fx DM2  Plan: Fracture requires surgical treatment Plan for OR this afternoon NPO    Kaitelyn Jamison, Horald Pollen, MD Cell [phone]    05/31/2015 7:11 AM

## 2015-05-31 NOTE — Clinical Social Work Placement (Signed)
   CLINICAL SOCIAL WORK PLACEMENT  NOTE  Date:  05/31/2015  Patient Details  Name: [name] MRN: [id number] Date of Birth: 25-Mar-1927  Clinical Social Work is seeking post-discharge placement for this patient at the [place] level of care (*CSW will initial, date and re-position this form in  chart as items are completed):  No   Patient/family provided with [place] Work Department's list of facilities offering this level of care within the geographic area requested by the patient (or if unable, by the patient's family).  Yes   Patient/family informed of their freedom to choose among providers that offer the needed level of care, that participate in Medicare, Medicaid or managed care program needed by the patient, have an available bed and are willing to accept the patient.  Yes   Patient/family informed of [facility]'s ownership interest in [facility] and [facility], as well as of the fact that they are under no obligation to receive care at these facilities.  PASRR submitted to EDS on       PASRR number received on       Existing PASRR number confirmed on 05/31/15     FL2 transmitted to all facilities in geographic area requested by pt/family on 05/31/15     FL2 transmitted to all facilities within larger geographic area on       Patient informed that his/her managed care company has contracts with or will negotiate with certain facilities, including the following:            Patient/family informed of bed offers received.  Patient chooses bed at       Physician recommends and patient chooses bed at      Patient to be transferred to   on  .  Patient to be transferred to facility by       Patient family notified on   of transfer.  Name of family member notified:        PHYSICIAN       Additional Comment:    _______________________________________________ Loraine Maple  [phone] 05/31/2015, 12:47 PM

## 2015-05-31 NOTE — NC FL2 (Signed)
[place] LEVEL OF CARE SCREENING TOOL     IDENTIFICATION  Patient Name: Briana Thornton Birthdate: 05-25-1927 Sex: female Admission Date (Current Location): 05/30/2015  [facility] and [place] Number:  Herbalist and Address:  [facility],  [place] [address] [place]      Provider Number: [id number]  Attending Physician Name and Address:  Oswald Hillock, MD  Relative Name and Phone Number:       Current Level of Care: Hospital Recommended Level of Care: [place] Prior Approval Number:    Date Approved/Denied:   PASRR Number: [id number]  Discharge Plan: SNF    Current Diagnoses: Patient Active Problem List   Diagnosis Date Noted  . Femur fracture, left ([place]) 05/31/2015  . Absolute anemia   . Labile blood pressure   . Hypoalbuminemia due to protein-calorie malnutrition ([place])   . Malnutrition of moderate degree 05/18/2015  . TIA (transient ischemic attack) 05/18/2015  . Left pontine cerebrovascular accident ([place]) 05/18/2015  . DM type 2 with diabetic peripheral neuropathy ([place]) 05/18/2015  . CAP (community acquired pneumonia) 03/05/2015  . SOB (shortness of breath) 03/05/2015  . Hypoxia 03/05/2015  . Weakness 03/05/2015  . Dehydration, mild 03/05/2015  . Controlled type 2 diabetes mellitus with diabetic nephropathy, without long-term current use of insulin ([place])   . Onychomycosis 10/31/2014  . UTI symptoms 01/25/2014  . Dysphagia 01/25/2014  . Abnormal finding on urinalysis 01/25/2014  . Dementia without behavioral disturbance 08/06/2013  . Postoperative anemia due to acute blood loss 07/26/2013  . Hip fracture ([place]) 07/24/2013  . Thyroid mass of unclear etiology 07/24/2013  . Fall at home 07/24/2013  . Cerumen impaction 04/21/2013  . Cognitive decline 02/15/2013  . Anemia 05/22/2012  . Depression 05/22/2012  . Dermatitis 05/22/2012  . Broken wrist   . Pacemaker-St.Jude 09/18/2011  . URI (upper  respiratory infection) 08/31/2011  . Lymph node enlargement 08/31/2011  . Weight loss 07/31/2011  . Cardiovascular system disease 07/02/2011  . Hemorrhoid 07/02/2011  . Osteoporosis 07/02/2011  . Essential hypertension   . Hyperlipidemia   . IBS (irritable bowel syndrome)   . DM (diabetes mellitus) ([place])   . Calcification of cartilage   . BPPV (benign paroxysmal positional vertigo)   . Vision loss of right eye     Orientation RESPIRATION BLADDER Height & Weight    Place, Self  Normal Continent 5\' 2"  (157.5 cm) 106 lbs.  BEHAVIORAL SYMPTOMS/MOOD NEUROLOGICAL BOWEL NUTRITION STATUS  Other (Comment) (no behaviors)   Continent Diet  AMBULATORY STATUS COMMUNICATION OF NEEDS Skin   Extensive Assist Verbally  surgical wounds                     Personal Care Assistance Level of Assistance  Bathing, Feeding, Dressing Bathing Assistance: Limited assistance Feeding assistance: Independent Dressing Assistance: Limited assistance     Functional Limitations Info  Sight, Hearing, Speech Sight Info: Adequate Hearing Info: Adequate Speech Info: Adequate    SPECIAL CARE FACTORS FREQUENCY  PT (By licensed PT), OT (By licensed OT)     PT Frequency: 5 x wk OT Frequency: 5 x wk            Contractures Contractures Info: Not present    Additional Factors Info  Code Status Code Status Info: Full Code             Current Medications (05/31/2015):  This is the current hospital active medication list Current Facility-Administered Medications  Medication  Dose Route Frequency Provider Last Rate Last Dose  . 0.9 %  sodium chloride infusion   Intravenous Continuous Norval Morton, MD 75 mL/hr at 05/31/15 0323    . acidophilus (RISAQUAD) capsule 1 capsule  1 capsule Oral Daily Norval Morton, MD   1 capsule at 05/31/15 1000  . alum & mag hydroxide-simeth (MAALOX/MYLANTA) 200-200-20 MG/5ML suspension 30 mL  30 mL Oral Q6H PRN Norval Morton, MD      . aspirin tablet 325 mg   325 mg Oral Daily Norval Morton, MD   325 mg at 05/31/15 1000  . atorvastatin (LIPITOR) tablet 10 mg  10 mg Oral q1800 Rondell A Tamala Julian, MD      . ceFAZolin (ANCEF) IVPB 2 g/50 mL premix  2 g Intravenous 30 min Pre-Op Rod Can, MD      . cefTRIAXone (ROCEPHIN) 1 g in dextrose 5 % 50 mL IVPB  1 g Intravenous Q24H Oswald Hillock, MD   1 g at 05/31/15 0848  . docusate sodium (COLACE) capsule 100 mg  100 mg Oral QODAY Rondell A Tamala Julian, MD   100 mg at 05/31/15 1000  . donepezil (ARICEPT) tablet 10 mg  10 mg Oral QHS Rondell A Smith, MD      . feeding supplement (ENSURE ENLIVE) (ENSURE ENLIVE) liquid 237 mL  237 mL Oral Daily Norval Morton, MD   237 mL at 05/31/15 1000  . ferrous sulfate tablet 325 mg  325 mg Oral Q breakfast Norval Morton, MD   325 mg at 05/31/15 1000  . heparin injection 5,000 Units  5,000 Units Subcutaneous 3 times per day Norval Morton, MD   5,000 Units at 05/31/15 0552  . ipratropium-albuterol (DUONEB) 0.5-2.5 (3) MG/3ML nebulizer solution 3 mL  3 mL Inhalation Q6H PRN Norval Morton, MD      . lisinopril (PRINIVIL,ZESTRIL) tablet 5 mg  5 mg Oral Daily Rondell Charmayne Sheer, MD   5 mg at 05/31/15 1000  . morphine 2 MG/ML injection 1 mg  1 mg Intravenous Q2H PRN Norval Morton, MD      . ondansetron (ZOFRAN) tablet 4 mg  4 mg Oral Q6H PRN Norval Morton, MD       Or  . ondansetron (ZOFRAN) injection 4 mg  4 mg Intravenous Q6H PRN Rondell A Tamala Julian, MD      . polyethylene glycol (MIRALAX / GLYCOLAX) packet 17 g  17 g Oral Daily PRN Norval Morton, MD      . polyvinyl alcohol (LIQUIFILM TEARS) 1.4 % ophthalmic solution 1 drop  1 drop Both Eyes PRN Rondell A Tamala Julian, MD      . traMADol (ULTRAM) tablet 50 mg  50 mg Oral Q6H PRN Norval Morton, MD         Discharge Medications: Please see discharge summary for a list of discharge medications.  Relevant Imaging Results:  Relevant Lab Results:   Additional Information SS # [id number]. Surgical pcr+ MRSA   05/31/15.  Azia Toutant, Randall An, LCSW

## 2015-05-31 NOTE — Consult Note (Signed)
ORTHOPAEDIC CONSULTATION  REQUESTING PHYSICIAN: Oswald Hillock, MD  PCP:  Penni Homans, MD  Chief Complaint: left hip pain  HPI: Briana Thornton is a 82 y.o. female who complains of left hip pain and inability to [facility] after a fall yesterday while ambulating to bathroom. Normally a household ambulator with a walker since sustaining right hip fracture 2 years ago. Had a recent stroke.  Past Medical History  Diagnosis Date  . Chicken pox as a child  . Measles as a child  . IBS (irritable bowel syndrome)   . Diabetes mellitus 47    type 2  . Hyperlipidemia 72  . Hypertension 72  . Vision loss of right eye   . Vertigo 2010    benign  . Calcification of cartilage     ear  . Cardiovascular system disease 07/02/2011  . Hemorrhoid 07/02/2011  . Osteoporosis 07/02/2011  . Weight loss 07/31/2011  . Anemia 05/22/2012  . Broken wrist 10-22-11    left  . Depression 05/22/2012  . Dermatitis 05/22/2012    Right neck  . DM type 2 with diabetic peripheral neuropathy ([place]) 05/18/2015   Past Surgical History  Procedure Laterality Date  . Hemorroidectomy  1979  . Knee scoped  2002    right  . Rotator cuff repair  2004    right  . Abdominal hysterectomy  2008    partial still has ovaries  . Lens implant left  2006  . Pacemaker insertion  2010  . Wrist surgery  11-05-11    left wrist  . Intramedullary (im) nail intertrochanteric Right 07/24/2013    Procedure: INTRAMEDULLARY (IM) NAIL INTERTROCHANTRIC;  Surgeon: Gearlean Alf, MD;  Location: [facility] ORS;  Service: Orthopedics;  Laterality: Right;   Social History   Social History  . Marital Status: Widowed    Spouse Name: N/A  . Number of Children: N/A  . Years of Education: N/A   Social History Main Topics  . Smoking status: Never Smoker   . Smokeless tobacco: Never Used  . Alcohol Use: No  . Drug Use: No  . Sexual Activity: No   Other Topics Concern  . None   Social History Narrative   Family History  Problem Relation Age of  Onset  . Diabetes Mother     type 2  . Hypertension Mother   . Cancer Brother     lung-smoker  . Diabetes Daughter     pre diabetic  . Diabetes Son     type 2  . Cancer Maternal Grandfather     prostate   No Known Allergies Prior to Admission medications   Medication Sig Start Date End Date Taking? Authorizing Provider  aspirin 325 MG tablet Take 1 tablet (325 mg total) by mouth daily. 05/26/15  Yes Ivan Anchors Love, PA-C  atorvastatin (LIPITOR) 10 MG tablet TAKE 1 TABLET (10 MG TOTAL) BY MOUTH DAILY. 04/26/15  Yes Mosie Lukes, MD  docusate sodium (COLACE) 100 MG capsule Take 100 mg by mouth every other day.    Yes Historical Provider, MD  donepezil (ARICEPT) 10 MG tablet Take 1 tablet (10 mg total) by mouth at bedtime. Patient taking differently: Take 10 mg by mouth daily.  04/26/15  Yes Mosie Lukes, MD  feeding supplement, ENSURE ENLIVE, (ENSURE ENLIVE) LIQD Take 237 mLs by mouth daily. Patient taking differently: Take 237 mLs by mouth daily as needed (nutrition).  05/18/15  Yes Albertine Patricia, MD  ferrous sulfate 325 (65 FE)  MG tablet Take 1 tablet (325 mg total) by mouth daily with breakfast. 05/27/15  Yes Ivan Anchors Love, PA-C  lisinopril (PRINIVIL,ZESTRIL) 5 MG tablet Take 1 tablet (5 mg total) by mouth daily. 05/28/15  Yes Ankit Lorie Phenix, MD  metFORMIN (GLUCOPHAGE-XR) 500 MG 24 hr tablet TAKE 1 TABLET (500 MG TOTAL) BY MOUTH DAILY WITH BREAKFAST. 04/26/15  Yes Historical Provider, MD  Probiotic Product (PROBIOTIC DAILY PO) Take 1 tablet by mouth daily.   Yes Historical Provider, MD  alum & mag hydroxide-simeth (MAALOX/MYLANTA) 200-200-20 MG/5ML suspension Take 30 mLs by mouth every 6 (six) hours as needed for indigestion or heartburn (dyspepsia). 07/27/13   Kelvin Cellar, MD  Ipratropium-Albuterol (COMBIVENT RESPIMAT) 20-100 MCG/ACT AERS respimat Inhale 1 puff into the lungs every 6 (six) hours as needed for wheezing or shortness of breath. 03/08/15   Barton Dubois, MD    methylcellulose (ARTIFICIAL TEARS) 1 % ophthalmic solution Place 1 drop into both eyes as needed (dry eyes).     Historical Provider, MD  polyethylene glycol (MIRALAX / GLYCOLAX) packet Take 17 g by mouth daily as needed for mild constipation. 07/27/13   Kelvin Cellar, MD   Dg Chest 1 View  05/31/2015  CLINICAL DATA:  Preoperative chest radiograph for left femoral fracture. Initial encounter. EXAM: CHEST 1 VIEW COMPARISON:  Chest radiograph performed 05/16/2015 FINDINGS: The lungs are mildly hypoexpanded. No focal consolidation, pleural effusion or pneumothorax is seen. The cardiomediastinal silhouette is borderline enlarged. A pacemaker is noted overlying the left chest wall, with leads ending overlying the right atrium and right ventricle. Dense calcification is suggested at the mitral valve. No acute osseous abnormalities are identified. IMPRESSION: 1. Lungs mildly hypoexpanded but grossly clear. 2. Borderline cardiomegaly. Dense calcification suggested at the mitral valve. Electronically Signed   By: Garald Balding M.D.   On: 05/31/2015 01:18   Ct Head Wo Contrast  05/31/2015  CLINICAL DATA:  Status post fall while going from bedroom to bathroom. Concern for head injury. Initial encounter. EXAM: CT HEAD WITHOUT CONTRAST TECHNIQUE: Contiguous axial images were obtained from the base of the skull through the vertex without intravenous contrast. COMPARISON:  CT of the head performed 05/18/2015 FINDINGS: There is no evidence of acute infarction, or intra- or extra-axial hemorrhage on CT. Moderate cortical volume loss is noted, with prominence of the ventricles and sulci. Cerebellar atrophy is seen. Relatively diffuse periventricular and subcortical white matter change likely reflects small vessel ischemic microangiopathy. Chronic lacunar infarcts are seen at the basal ganglia bilaterally. A chronic lacunar infarct is also noted at the left side of the pons. A 2.0 cm densely calcified meningioma is again noted  along the inferior aspect of the anterior falx cerebri, grossly stable in appearance. The brainstem and fourth ventricle are within normal limits. The basal ganglia are unremarkable in appearance. The cerebral hemispheres demonstrate grossly normal gray-white differentiation. No mass effect or midline shift is seen. There is no evidence of fracture; visualized osseous structures are unremarkable in appearance. The orbits are within normal limits. The paranasal sinuses and mastoid air cells are well-aerated. No significant soft tissue abnormalities are seen. IMPRESSION: 1. No acute intracranial pathology seen on CT. 2. Moderate cortical volume loss and diffuse small vessel ischemic microangiopathy. 3. Chronic lacunar infarcts at the basal ganglia bilaterally, and chronic lacunar infarct at the left side of the pons. 4. 2.0 cm densely calcified meningioma again noted along the inferior aspect of the anterior falx cerebri. Electronically Signed   By: Francoise Schaumann.D.  On: 05/31/2015 01:09   Dg Hip Unilat With Pelvis 2-3 Views Left  05/30/2015  CLINICAL DATA:  Status post fall, landing on left hip, with left hip shortening and pain. Initial encounter. EXAM: DG HIP (WITH OR WITHOUT PELVIS) 2-3V LEFT COMPARISON:  None. FINDINGS: There is a minimally displaced slightly comminuted left femoral intertrochanteric fracture, with mild lateral displacement of the distal femur. The left femoral head remains seated at the acetabulum. The right hip joint is unremarkable in appearance. Right femoral hardware is grossly unremarkable, though incompletely imaged. Mild degenerative change is noted at the sacroiliac joints. Diffuse vascular calcifications are seen. The visualized bowel gas pattern is grossly unremarkable. A prominent sclerotic focus is noted at the right hemipelvis, benign in appearance. IMPRESSION: 1. Minimally displaced slightly comminuted left femoral intertrochanteric fracture, with mild lateral displacement of  the distal femur. 2. Diffuse vascular calcifications seen. Electronically Signed   By: Garald Balding M.D.   On: 05/30/2015 23:33   Dg Femur Min 2 Views Left  05/31/2015  CLINICAL DATA:  Left hip pain after fall.  Initial encounter. EXAM: LEFT FEMUR 2 VIEWS COMPARISON:  None. FINDINGS: There is a mildly displaced relatively proximal left femoral intertrochanteric fracture, with sparing of the lesser femoral trochanter and mild lateral displacement. The left femoral head remains seated at the acetabulum. Diffuse vascular calcifications are seen. The knee joint is grossly unremarkable. No knee joint effusion is identified. IMPRESSION: 1. Mildly displaced relatively proximal left femoral intertrochanteric fracture, with sparing of the lesser femoral trochanter and mild lateral displacement. 2. Diffuse vascular calcifications seen. Electronically Signed   By: Garald Balding M.D.   On: 05/31/2015 01:20    Positive ROS: All other systems have been reviewed and were otherwise negative with the exception of those mentioned in the HPI and as above.  Physical Exam: General: Alert, no acute distress Cardiovascular: No pedal edema Respiratory: No cyanosis, no use of accessory musculature GI: No organomegaly, abdomen is soft and non-tender Skin: No lesions in the area of chief complaint Neurologic: Sensation intact distally Psychiatric: Patient is competent for consent with normal mood and affect Lymphatic: No axillary or cervical lymphadenopathy  MUSCULOSKELETAL: LLE is shortened and externally rotated. Pain with logroll. (+) TA/GS/EHL. Subjective sensory change due to neuropathy. 2+ DP.  Assessment: L IT femur fx DM2  Plan: Fracture requires surgical treatment Plan for OR this afternoon NPO    Farooq Petrovich, Horald Pollen, MD Cell [phone]    05/31/2015 7:11 AM

## 2015-05-31 NOTE — Progress Notes (Signed)
Subjective: Patient admitted this morning, see detailed H&P by Dr. Tamala Julian 82 year old female with past medical history of CVA, DM type 2, HTN, HLD, s/p PM; who presents after having a fall at home with complaints of left hip pain. Family notes that around 9:30 PM tonight it appears the patient was going from her bedroom and to the bathroom and apparently fell backwards onto her left hip. The patient denies any loss of consciousness. They tried to get her to the bathroom commode seat but she was not able to bear any weight on her left side. Patient complains of a throbbing pain. They have tried Tylenol without relief of symptoms. Family notes that approximately 2 years ago she had broken her right hip and since that time has walked with a walker. There is a transition from carpet to tile as the patient goes from her bedroom into the bathroom that she may have had difficulty with. Note she just was recently released from the rehabilitation facility after having a stroke.  Patient is pleasantly confused this morning. UA was mildly abnormal. Filed Vitals:   05/31/15 0131 05/31/15 0549  BP: 184/55 161/56  Pulse: 60 61  Temp:  98.2 F (36.8 C)  Resp: 15 15    Chest: Clear Bilaterally Heart : S1S2 RRR Abdomen: Soft, nontender Ext : No edema Neuro: Alert, oriented x 3  A/P Left femoral intertrochanteric fracture Diabetes mellitus Hypertension Hyperlipidemia Dementia UTI  Patient to go for hip repair today. We'll start Rocephin empirically for UTI. Follow urine culture results.    [place] [facility] [phone]

## 2015-05-31 NOTE — Progress Notes (Signed)
Pt arrived to floor with daughter at the bedside. Orders to note M.D. Once patient arrives to floor. Foley inserted per Doctor orders 16 french. Oriented to room environment.  High fall risk, bed exit alarm placed, call light and phone placed in reach. Awaiting for Doctor to arrive to the floor.

## 2015-05-31 NOTE — Brief Op Note (Signed)
05/30/2015 - 05/31/2015  5:05 PM  PATIENT:  [name]  82 y.o. female  PRE-OPERATIVE DIAGNOSIS:  LEFT INTERTROCHANTERIC FRACTURE  POST-OPERATIVE DIAGNOSIS:  LEFT INTERTROCHANTERIC FRACTURE  PROCEDURE:  Procedure(s): INTRAMEDULLARY (IM) NAIL INTERTROCHANTRIC (Left)  SURGEON:  Surgeon(s) and Role:    * Rod Can, MD - Primary  PHYSICIAN ASSISTANT: none  ASSISTANTS: staff   ANESTHESIA:   spinal  EBL:  Total I/O In: 496.3 [I.V.:496.3] Out: 525 [Urine:450; Blood:75]  BLOOD ADMINISTERED:none  DRAINS: none   LOCAL MEDICATIONS USED:  NONE  SPECIMEN:  No Specimen  DISPOSITION OF SPECIMEN:  N/A  COUNTS:  YES  TOURNIQUET:  * No tourniquets in log *  DICTATION: .Other Dictation: Dictation Number [id number]  PLAN OF CARE: Admit to inpatient   PATIENT DISPOSITION:  PACU - hemodynamically stable.   Delay start of Pharmacological VTE agent (>24hrs) due to surgical blood loss or risk of bleeding: no

## 2015-05-31 NOTE — Progress Notes (Signed)
PT Cancellation Note  Patient Details Name: Breda Dhein MRN: [id number] DOB: 11/27/1926   Cancelled Treatment:    Reason Eval/Treat Not Completed: Patient not medically ready. Pt to have surgery later today. Will sign off. Please reorder once medically ready. Thanks.    Weston Anna, MPT Pager: [phone]

## 2015-05-31 NOTE — Anesthesia Preprocedure Evaluation (Addendum)
Anesthesia Evaluation  Patient identified by MRN, date of birth, ID band Patient awake    Reviewed: Allergy & Precautions, NPO status , Patient's Chart, lab work & pertinent test results  Airway Mallampati: II  TM Distance: >3 FB Neck ROM: Full    Dental   Pulmonary    breath sounds clear to auscultation       Cardiovascular hypertension, Pt. on medications + pacemaker  Rhythm:Regular Rate:Normal     Neuro/Psych Depression TIACVA    GI/Hepatic negative GI ROS, Neg liver ROS,   Endo/Other  diabetes, Type 2, Oral Hypoglycemic Agents  Renal/GU negative Renal ROS     Musculoskeletal   Abdominal   Peds  Hematology negative hematology ROS (+)   Anesthesia Other Findings   Reproductive/Obstetrics                           Lab Results  Component Value Date   WBC 9.4 05/30/2015   HGB 13.3 05/31/2015   HCT 39.0 05/31/2015   MCV 93.8 05/30/2015   PLT 283 05/30/2015   Lab Results  Component Value Date   CREATININE 0.90 05/31/2015   BUN 32* 05/31/2015   NA 146* 05/31/2015   K 3.9 05/31/2015   CL 108 05/31/2015   CO2 29 05/30/2015   Lab Results  Component Value Date   INR 1.13 05/31/2015   INR 1.02 07/24/2013    Anesthesia Physical Anesthesia Plan  ASA: III  Anesthesia Plan: MAC and Spinal   Post-op Pain Management:    Induction: Intravenous  Airway Management Planned: Simple Face Mask and Natural Airway  Additional Equipment:   Intra-op Plan:   Post-operative Plan:   Informed Consent: I have reviewed the patients History and Physical, chart, labs and discussed the procedure including the risks, benefits and alternatives for the proposed anesthesia with the patient or authorized representative who has indicated his/her understanding and acceptance.   Dental advisory given  Plan Discussed with: CRNA and Surgeon  Anesthesia Plan Comments:       Anesthesia Quick  Evaluation

## 2015-05-31 NOTE — H&P (Signed)
[facility] History and Physical  Briana Thornton [id number] DOB: 09/06/1926 DOA: 05/30/2015  Referring physician: ED PCP: Penni Homans, MD   Chief Complaint: Fall  HPI:  Briana Thornton is a 82 year old female with past medical history of CVA, DM type 2, HTN, HLD, s/p PM; who presents after having a fall at home with complaints of left hip pain. Family notes that around 9:30 PM tonight it appears the patient was going from her bedroom and to the bathroom and apparently fell backwards onto her left hip. The patient denies any loss of consciousness. They tried to get her to the bathroom commode seat but she was not able to bear any weight on her left side. Patient complains of a throbbing pain. They have tried Tylenol without relief of symptoms. Family notes that approximately 2 years ago she had broken her right hip and since that time has walked with a walker.  There is a transition from carpet to tile as the patient goes from her bedroom into the bathroom that she may have had difficulty with. Note she just was recently released from the rehabilitation facility after having a stroke  Upon admission into the emergency department the patient was evaluated with x-rays of left hip which showed a left femoral intertrochanteric fracture. ED physician consulted Dr. Lyla Glassing of orthopedics to evaluate the patient    Review of Systems  Constitutional: Negative for fever, chills, malaise/fatigue and diaphoresis.  HENT: Negative for ear pain and nosebleeds.   Eyes: Negative for photophobia and pain.  Respiratory: Positive for cough. Negative for sputum production.   Cardiovascular: Negative for chest pain and leg swelling.  Gastrointestinal: Positive for constipation. Negative for abdominal pain and diarrhea.  Genitourinary: Negative for urgency.  Musculoskeletal: Positive for joint pain and falls.  Skin: Negative for itching and rash.  Neurological: Negative for speech change and loss of  consciousness.  Endo/Heme/Allergies: Negative for environmental allergies and polydipsia.  Psychiatric/Behavioral: Positive for memory loss. Negative for substance abuse.      Past Medical History  Diagnosis Date  . Chicken pox as a child  . Measles as a child  . IBS (irritable bowel syndrome)   . Diabetes mellitus 47    type 2  . Hyperlipidemia 72  . Hypertension 72  . Vision loss of right eye   . Vertigo 2010    benign  . Calcification of cartilage     ear  . Cardiovascular system disease 07/02/2011  . Hemorrhoid 07/02/2011  . Osteoporosis 07/02/2011  . Weight loss 07/31/2011  . Anemia 05/22/2012  . Broken wrist 10-22-11    left  . Depression 05/22/2012  . Dermatitis 05/22/2012    Right neck  . DM type 2 with diabetic peripheral neuropathy ([place]) 05/18/2015     Past Surgical History  Procedure Laterality Date  . Hemorroidectomy  1979  . Knee scoped  2002    right  . Rotator cuff repair  2004    right  . Abdominal hysterectomy  2008    partial still has ovaries  . Lens implant left  2006  . Pacemaker insertion  2010  . Wrist surgery  11-05-11    left wrist  . Intramedullary (im) nail intertrochanteric Right 07/24/2013    Procedure: INTRAMEDULLARY (IM) NAIL INTERTROCHANTRIC;  Surgeon: Gearlean Alf, MD;  Location: [facility];  Service: Orthopedics;  Laterality: Right;      Social History:  reports that she has never smoked. She has never used smokeless tobacco. She reports that  she does not drink alcohol or use illicit drugs. Where does patient live--home  and with whom if at home? With family   No Known Allergies  Family History  Problem Relation Age of Onset  . Diabetes Mother     type 2  . Hypertension Mother   . Cancer Brother     lung-smoker  . Diabetes Daughter     pre diabetic  . Diabetes Son     type 2  . Cancer Maternal Grandfather     prostate        Prior to Admission medications   Medication Sig Start Date End Date Taking? Authorizing  Provider  aspirin 325 MG tablet Take 1 tablet (325 mg total) by mouth daily. 05/26/15  Yes Ivan Anchors Love, PA-C  atorvastatin (LIPITOR) 10 MG tablet TAKE 1 TABLET (10 MG TOTAL) BY MOUTH DAILY. 04/26/15  Yes Mosie Lukes, MD  docusate sodium (COLACE) 100 MG capsule Take 100 mg by mouth every other day.    Yes Historical Provider, MD  donepezil (ARICEPT) 10 MG tablet Take 1 tablet (10 mg total) by mouth at bedtime. Patient taking differently: Take 10 mg by mouth daily.  04/26/15  Yes Mosie Lukes, MD  feeding supplement, ENSURE ENLIVE, (ENSURE ENLIVE) LIQD Take 237 mLs by mouth daily. Patient taking differently: Take 237 mLs by mouth daily as needed (nutrition).  05/18/15  Yes Albertine Patricia, MD  ferrous sulfate 325 (65 FE) MG tablet Take 1 tablet (325 mg total) by mouth daily with breakfast. 05/27/15  Yes Ivan Anchors Love, PA-C  lisinopril (PRINIVIL,ZESTRIL) 5 MG tablet Take 1 tablet (5 mg total) by mouth daily. 05/28/15  Yes Ankit Lorie Phenix, MD  metFORMIN (GLUCOPHAGE-XR) 500 MG 24 hr tablet TAKE 1 TABLET (500 MG TOTAL) BY MOUTH DAILY WITH BREAKFAST. 04/26/15  Yes Historical Provider, MD  Probiotic Product (PROBIOTIC DAILY PO) Take 1 tablet by mouth daily.   Yes Historical Provider, MD  alum & mag hydroxide-simeth (MAALOX/MYLANTA) 200-200-20 MG/5ML suspension Take 30 mLs by mouth every 6 (six) hours as needed for indigestion or heartburn (dyspepsia). 07/27/13   Kelvin Cellar, MD  Ipratropium-Albuterol (COMBIVENT RESPIMAT) 20-100 MCG/ACT AERS respimat Inhale 1 puff into the lungs every 6 (six) hours as needed for wheezing or shortness of breath. 03/08/15   Barton Dubois, MD  methylcellulose (ARTIFICIAL TEARS) 1 % ophthalmic solution Place 1 drop into both eyes as needed (dry eyes).     Historical Provider, MD  polyethylene glycol (MIRALAX / GLYCOLAX) packet Take 17 g by mouth daily as needed for mild constipation. 07/27/13   Kelvin Cellar, MD     Physical Exam: Filed Vitals:   05/30/15 2237  05/31/15 0003  BP: 164/61 177/70  Pulse: 73 59  Temp: 97.7 F (36.5 C)   TempSrc: Oral   Resp: 18 16  SpO2: 100% 99%     Constitutional: Vital signs reviewed. Patient is a well-developed and well-nourished in no acute distress and cooperative with exam. Alert and oriented x2.  Head: Normocephalic and atraumatic  Ear: TM normal bilaterally  Mouth: no erythema or exudates, MMM  Eyes: PERRL, EOMI, conjunctivae normal, No scleral icterus.  Neck: Supple, Trachea midline normal ROM, No JVD, mass, thyromegaly, or carotid bruit present.  Cardiovascular: Pacemaker on left chest wall RRR, S1 normal, S2 normal, no MRG, pulses symmetric and intact bilaterally  Pulmonary/Chest: CTAB, no wheezes, rales, or rhonchi  Abdominal: Soft. Non-tender, non-distended, bowel sounds are normal, no masses, organomegaly, or guarding present.  GU: no CVA tenderness Musculoskeletal: Decreased range of motion of the left hip secondary to pain.  Tenderness to palpation of the left hip.  Ext: no edema and no cyanosis, pulses palpable bilaterally (DP and PT)  Hematology: no cervical, inginal, or axillary adenopathy.  Neurological: A&O x2, Strenght is normal and symmetric bilaterally, cranial nerve II-XII are grossly intact, no focal motor deficit, sensory intact to light touch bilaterally.  Skin: Warm, dry and intact. No rash, cyanosis, or clubbing.  Psychiatric: Normal mood and affect. speech and behavior is normal. Judgment and thought content normal. Cognition and memory are normal.      Data Review   Micro Results No results found for this or any previous visit (from the past 240 hour(s)).  Radiology Reports Ct Angio Head W/cm &/or Wo Cm  05/18/2015  CLINICAL DATA:  Weakness for several days, old compromise walking at baseline. Gait imbalance. Possible LEFT pontine infarct. History of hypertension, hyperlipidemia. EXAM: CT ANGIOGRAPHY HEAD AND NECK TECHNIQUE: Multidetector CT imaging of the head and neck was  performed using the standard protocol during bolus administration of intravenous contrast. Multiplanar CT image reconstructions and MIPs were obtained to evaluate the vascular anatomy. Carotid stenosis measurements (when applicable) are obtained utilizing NASCET criteria, using the distal internal carotid diameter as the denominator. CONTRAST:  28mL OMNIPAQUE IOHEXOL 350 MG/ML SOLN 50 cc Omnipaque 350 COMPARISON:  CT head May 16, 2015 FINDINGS: CT HEAD The ventricles and sulci are normal for age. No intraparenchymal hemorrhage, mass effect nor midline shift. Confluent supratentorial white matter hypodensities. Subcentimeter LEFT pontine white matter hypodensity persists. No acute large vascular territory infarcts. Old small LEFT basal ganglia lacunar infarcts. No abnormal intracranial enhancement. No abnormal extra-axial fluid collections. 19 mm anterior cranial fossa meningioma with local mass effect. Basal cisterns are patent. Severe calcific atherosclerosis of the carotid siphons. No skull fracture. The included ocular globes and orbital contents are non-suspicious. Status post LEFT ocular lens implant. The mastoid aircells and included paranasal sinuses are well-aerated. Severe bilateral temporomandibular osteoarthrosis. CTA NECK Aortic arch: Normal appearance of the thoracic arch, normal branch pattern. Moderate calcific atherosclerosis. The origins of the innominate, left Common carotid artery and subclavian artery are widely patent. Right carotid system: Common carotid artery is widely patent, coursing in a straight line fashion. Normal appearance of the carotid bifurcation without hemodynamically significant stenosis by NASCET criteria. Mild eccentric calcific atherosclerosis of the carotid bulb. Normal appearance of the included internal carotid artery. Left carotid system: Common carotid artery is widely patent, coursing in a straight line fashion. Normal appearance of the carotid bifurcation without  hemodynamically significant stenosis by NASCET criteria. Mild eccentric calcific atherosclerosis of the carotid bulb. Normal appearance of the included internal carotid artery. Vertebral arteries:RIGHT vertebral artery is dominant. Normal appearance of the vertebral arteries, which appear widely patent. Skeleton: No acute osseous process though bone windows have not been submitted. Other neck: Soft tissues of the neck are nonacute though, not tailored for evaluation. 18 mm nodule isodense to thyroid contiguous with the RIGHT posterior lobe. CTA HEAD Anterior circulation: Normal appearance of the cervical internal carotid arteries, petrous, cavernous and supra clinoid internal carotid arteries. Widely patent anterior communicating artery. LEFT A1 segment is developmentally dominant. Patent anterior and middle cerebral arteries. Mild stenosis LEFT M2 segment. Posterior circulation: RIGHT vertebral artery is dominant with normal appearance of the vertebral arteries, vertebrobasilar junction and basilar artery, as well as main branch vessels. Moderate tandem stenosis bilateral posterior cerebral artery. No large vessel occlusion, hemodynamically  significant stenosis, dissection, luminal irregularity, contrast extravasation or aneurysm within the anterior nor posterior circulation. IMPRESSION: CT HEAD:  No acute intracranial process. Chronic changes including moderate to severe chronic small vessel ischemic disease, and LEFT pontine infarct. Stable 19 mm anterior cranial fossa meningioma. CTA NECK: No hemodynamically significant stenosis or acute vascular process. 18 mm nodule within or contiguous with RIGHT posterior thyroid lobe, recommend thyroid sonogram on a nonemergent basis. CTA HEAD: No acute large vessel occlusion. 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 Mild stenosis LEFT M2 segment. Electronically Signed   By: Elon Alas M.D.   On: 05/18/2015 02:48    Dg Chest 1 View  05/31/2015  CLINICAL DATA:  Preoperative chest radiograph for left femoral fracture. Initial encounter. EXAM: CHEST 1 VIEW COMPARISON:  Chest radiograph performed 05/16/2015 FINDINGS: The lungs are mildly hypoexpanded. No focal consolidation, pleural effusion or pneumothorax is seen. The cardiomediastinal silhouette is borderline enlarged. A pacemaker is noted overlying the left chest wall, with leads ending overlying the right atrium and right ventricle. Dense calcification is suggested at the mitral valve. No acute osseous abnormalities are identified. IMPRESSION: 1. Lungs mildly hypoexpanded but grossly clear. 2. Borderline cardiomegaly. Dense calcification suggested at the mitral valve. Electronically Signed   By: Garald Balding M.D.   On: 05/31/2015 01:18   Ct Head Wo Contrast  05/31/2015  CLINICAL DATA:  Status post fall while going from bedroom to bathroom. Concern for head injury. Initial encounter. EXAM: CT HEAD WITHOUT CONTRAST TECHNIQUE: Contiguous axial images were obtained from the base of the skull through the vertex without intravenous contrast. COMPARISON:  CT of the head performed 05/18/2015 FINDINGS: There is no evidence of acute infarction, or intra- or extra-axial hemorrhage on CT. Moderate cortical volume loss is noted, with prominence of the ventricles and sulci. Cerebellar atrophy is seen. Relatively diffuse periventricular and subcortical white matter change likely reflects small vessel ischemic microangiopathy. Chronic lacunar infarcts are seen at the basal ganglia bilaterally. A chronic lacunar infarct is also noted at the left side of the pons. A 2.0 cm densely calcified meningioma is again noted along the inferior aspect of the anterior falx cerebri, grossly stable in appearance. The brainstem and fourth ventricle are within normal limits. The basal ganglia are unremarkable in appearance. The cerebral hemispheres demonstrate grossly normal gray-white  differentiation. No mass effect or midline shift is seen. There is no evidence of fracture; visualized osseous structures are unremarkable in appearance. The orbits are within normal limits. The paranasal sinuses and mastoid air cells are well-aerated. No significant soft tissue abnormalities are seen. IMPRESSION: 1. No acute intracranial pathology seen on CT. 2. Moderate cortical volume loss and diffuse small vessel ischemic microangiopathy. 3. Chronic lacunar infarcts at the basal ganglia bilaterally, and chronic lacunar infarct at the left side of the pons. 4. 2.0 cm densely calcified meningioma again noted along the inferior aspect of the anterior falx cerebri. Electronically Signed   By: Garald Balding M.D.   On: 05/31/2015 01:09   Ct Head Wo Contrast  05/16/2015  CLINICAL DATA:  82 year old diabetic hypertensive female with generalize weakness. Initial encounter. EXAM: CT HEAD WITHOUT CONTRAST TECHNIQUE: Contiguous axial images were obtained from the base of the skull through the vertex without intravenous contrast. COMPARISON:  03/05/2015. FINDINGS: No intracranial hemorrhage. New from the prior examination is linear low density superior left paracentral pontine region. Question small acute infarct versus result of streak artifact. Remote basal ganglia infarcts bilaterally. Remote left corona radiata infarct.  Prominent small vessel disease type changes. Global atrophy without hydrocephalus. Anterior frontal 1.9 cm calcified mass consistent with meningioma unchanged. No significant surrounding vasogenic edema. Mastoid air cells, middle ear cavities and visualized paranasal sinuses are clear. Post left lens replacement otherwise orbital structures unremarkable. IMPRESSION: No intracranial hemorrhage. New from the prior examination is linear low density superior left paracentral pontine region. Question small acute infarct versus result of streak artifact. Remote basal ganglia infarcts bilaterally. Remote  left corona radiata infarct. Prominent small vessel disease type changes. Global atrophy without hydrocephalus. Anterior frontal 1.9 cm calcified mass consistent with meningioma unchanged. No significant surrounding vasogenic edema. Electronically Signed   By: Genia Del M.D.   On: 05/16/2015 16:28   Ct Angio Neck W/cm &/or Wo/cm  05/18/2015  CLINICAL DATA:  Weakness for several days, old compromise walking at baseline. Gait imbalance. Possible LEFT pontine infarct. History of hypertension, hyperlipidemia. EXAM: CT ANGIOGRAPHY HEAD AND NECK TECHNIQUE: Multidetector CT imaging of the head and neck was performed using the standard protocol during bolus administration of intravenous contrast. Multiplanar CT image reconstructions and MIPs were obtained to evaluate the vascular anatomy. Carotid stenosis measurements (when applicable) are obtained utilizing NASCET criteria, using the distal internal carotid diameter as the denominator. CONTRAST:  1mL OMNIPAQUE IOHEXOL 350 MG/ML SOLN 50 cc Omnipaque 350 COMPARISON:  CT head May 16, 2015 FINDINGS: CT HEAD The ventricles and sulci are normal for age. No intraparenchymal hemorrhage, mass effect nor midline shift. Confluent supratentorial white matter hypodensities. Subcentimeter LEFT pontine white matter hypodensity persists. No acute large vascular territory infarcts. Old small LEFT basal ganglia lacunar infarcts. No abnormal intracranial enhancement. No abnormal extra-axial fluid collections. 19 mm anterior cranial fossa meningioma with local mass effect. Basal cisterns are patent. Severe calcific atherosclerosis of the carotid siphons. No skull fracture. The included ocular globes and orbital contents are non-suspicious. Status post LEFT ocular lens implant. The mastoid aircells and included paranasal sinuses are well-aerated. Severe bilateral temporomandibular osteoarthrosis. CTA NECK Aortic arch: Normal appearance of the thoracic arch, normal branch pattern.  Moderate calcific atherosclerosis. The origins of the innominate, left Common carotid artery and subclavian artery are widely patent. Right carotid system: Common carotid artery is widely patent, coursing in a straight line fashion. Normal appearance of the carotid bifurcation without hemodynamically significant stenosis by NASCET criteria. Mild eccentric calcific atherosclerosis of the carotid bulb. Normal appearance of the included internal carotid artery. Left carotid system: Common carotid artery is widely patent, coursing in a straight line fashion. Normal appearance of the carotid bifurcation without hemodynamically significant stenosis by NASCET criteria. Mild eccentric calcific atherosclerosis of the carotid bulb. Normal appearance of the included internal carotid artery. Vertebral arteries:RIGHT vertebral artery is dominant. Normal appearance of the vertebral arteries, which appear widely patent. Skeleton: No acute osseous process though bone windows have not been submitted. Other neck: Soft tissues of the neck are nonacute though, not tailored for evaluation. 18 mm nodule isodense to thyroid contiguous with the RIGHT posterior lobe. CTA HEAD Anterior circulation: Normal appearance of the cervical internal carotid arteries, petrous, cavernous and supra clinoid internal carotid arteries. Widely patent anterior communicating artery. LEFT A1 segment is developmentally dominant. Patent anterior and middle cerebral arteries. Mild stenosis LEFT M2 segment. Posterior circulation: RIGHT vertebral artery is dominant with normal appearance of the vertebral arteries, vertebrobasilar junction and basilar artery, as well as main branch vessels. Moderate tandem stenosis bilateral posterior cerebral artery. No large vessel occlusion, hemodynamically significant stenosis, dissection, luminal irregularity, contrast extravasation or aneurysm  within the anterior nor posterior circulation. IMPRESSION: CT HEAD:  No acute  intracranial process. Chronic changes including moderate to severe chronic small vessel ischemic disease, and LEFT pontine infarct. Stable 19 mm anterior cranial fossa meningioma. CTA NECK: No hemodynamically significant stenosis or acute vascular process. 18 mm nodule within or contiguous with RIGHT posterior thyroid lobe, recommend thyroid sonogram on a nonemergent basis. CTA HEAD: No acute large vessel occlusion. 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 Mild stenosis LEFT M2 segment. Electronically Signed   By: Elon Alas M.D.   On: 05/18/2015 02:48   Dg Chest Portable 1 View  05/16/2015  CLINICAL DATA:  82 year old female with generalized weakness and decreased appetite EXAM: PORTABLE CHEST 1 VIEW COMPARISON:  Prior chest x-ray 04/26/2015 FINDINGS: Stable borderline cardiomegaly with left heart enlargement. Atherosclerotic calcifications again noted in the transverse aorta. Unchanged position of left subclavian approach cardiac rhythm maintenance device with leads overlying the right atrium and right ventricle. Calcified granuloma in the left lower lobe remains unchanged. Dense calcification is also present surrounding the mitral valve annulus. The lungs are clear. No focal airspace consolidation, pleural effusion, pneumothorax or evidence of pulmonary edema. No suspicious pulmonary mass or nodule. Stable mild central bronchitic changes and interstitial prominence. No acute osseous abnormality. IMPRESSION: Stable chest x-ray without evidence of acute cardiopulmonary process. Electronically Signed   By: Jacqulynn Cadet M.D.   On: 05/16/2015 15:15   Dg Hip Unilat With Pelvis 2-3 Views Left  05/30/2015  CLINICAL DATA:  Status post fall, landing on left hip, with left hip shortening and pain. Initial encounter. EXAM: DG HIP (WITH OR WITHOUT PELVIS) 2-3V LEFT COMPARISON:  None. FINDINGS: There is a minimally displaced slightly comminuted left femoral  intertrochanteric fracture, with mild lateral displacement of the distal femur. The left femoral head remains seated at the acetabulum. The right hip joint is unremarkable in appearance. Right femoral hardware is grossly unremarkable, though incompletely imaged. Mild degenerative change is noted at the sacroiliac joints. Diffuse vascular calcifications are seen. The visualized bowel gas pattern is grossly unremarkable. A prominent sclerotic focus is noted at the right hemipelvis, benign in appearance. IMPRESSION: 1. Minimally displaced slightly comminuted left femoral intertrochanteric fracture, with mild lateral displacement of the distal femur. 2. Diffuse vascular calcifications seen. Electronically Signed   By: Garald Balding M.D.   On: 05/30/2015 23:33   Dg Femur Min 2 Views Left  05/31/2015  CLINICAL DATA:  Left hip pain after fall.  Initial encounter. EXAM: LEFT FEMUR 2 VIEWS COMPARISON:  None. FINDINGS: There is a mildly displaced relatively proximal left femoral intertrochanteric fracture, with sparing of the lesser femoral trochanter and mild lateral displacement. The left femoral head remains seated at the acetabulum. Diffuse vascular calcifications are seen. The knee joint is grossly unremarkable. No knee joint effusion is identified. IMPRESSION: 1. Mildly displaced relatively proximal left femoral intertrochanteric fracture, with sparing of the lesser femoral trochanter and mild lateral displacement. 2. Diffuse vascular calcifications seen. Electronically Signed   By: Garald Balding M.D.   On: 05/31/2015 01:20     CBC  Recent Labs Lab 05/24/15 0530 05/30/15 2338 05/31/15 0002  WBC 5.6 9.4  --   HGB 9.9* 11.6* 13.3  HCT 30.8* 38.0 39.0  PLT 252 283  --   MCV 91.4 93.8  --   MCH 29.4 28.6  --   MCHC 32.1 30.5  --   RDW 14.5 14.5  --   LYMPHSABS 2.0 1.6  --  MONOABS 0.5 0.3  --   EOSABS 0.3 0.2  --   BASOSABS 0.0 0.0  --     Chemistries   Recent Labs Lab 05/24/15 0530  05/30/15 2338 05/31/15 0002  NA 145 147* 146*  K 3.8 4.0 3.9  CL 111 108 108  CO2 28 29  --   GLUCOSE 90 122* 113*  BUN 24* 29* 32*  CREATININE 0.91 0.84 0.90  CALCIUM 8.9 9.5  --    ------------------------------------------------------------------------------------------------------------------ estimated creatinine clearance is 35.4 mL/min (by C-G formula based on Cr of 0.9). ------------------------------------------------------------------------------------------------------------------ No results for input(s): HGBA1C in the last 72 hours. ------------------------------------------------------------------------------------------------------------------ No results for input(s): CHOL, HDL, LDLCALC, TRIG, CHOLHDL, LDLDIRECT in the last 72 hours. ------------------------------------------------------------------------------------------------------------------ No results for input(s): TSH, T4TOTAL, T3FREE, THYROIDAB in the last 72 hours.  Invalid input(s): FREET3 ------------------------------------------------------------------------------------------------------------------ No results for input(s): VITAMINB12, FOLATE, FERRITIN, TIBC, IRON, RETICCTPCT in the last 72 hours.  Coagulation profile No results for input(s): INR, PROTIME in the last 168 hours.  No results for input(s): DDIMER in the last 72 hours.  Cardiac Enzymes No results for input(s): CKMB, TROPONINI, MYOGLOBIN in the last 168 hours.  Invalid input(s): CK ------------------------------------------------------------------------------------------------------------------ Invalid input(s): POCBNP   CBG:  Recent Labs Lab 05/24/15 0743 05/24/15 1111 05/24/15 1413  GLUCAP 87 107* 172*       EKG: Independently reviewed. Paced rhythm   Assessment/Plan  Left hip fracture acute status post fall tonight around 9:30 PM patient with inability to bear weight on the affected side. X-ray of left hip showed left  femoral intertrochanteric fracture.  Dr. Lyla Glassing of Orthopedics consulted by the ED physician  - Admit to MedSurg bed - Pain control with tramadol/morphine for moderate to severe pain  - Follow-up with orthopedics recommendations in a.m.  - Physical therapy to eval and treat  - Social work consult   Diabetes mellitus type 2: Well controlled. Last hemoglobin A1c 5.8 in 04/2015 - Held metformin - CBG's with sensitive a sliding scale insulin  Hypertension - Continue lisinopril  Hyperlipidemia - Continue atorvastatin  Dementia - Continue Aricept  Status post pacemaker: Stable. Last checked 12/15/2014  Recent CVA  Code Status:   full Family Communication: bedside Disposition Plan: admit   Total time spent 55 minutes.Greater than 50% of this time was spent in counseling, explanation of diagnosis, planning of further management, and coordination of care  [facility] Pager [phone]  If 7PM-7AM, please contact night-coverage [email or web address] Password [email or web address] 05/31/2015, 1:24 AM

## 2015-05-31 NOTE — Progress Notes (Signed)
Transfer from 5west, post-op.Patient is alert and oriented to self. Able to easily reorient patient. Aquacel noted to upper left hip, swelling to left hip. Reddened, dry area noted to right heel, floating heels. Abrasion area noted to coccyx with dressing in place. Reddened circular area noted to upper right thigh. Patient resting at this time. Will continue to monitor.

## 2015-06-01 ENCOUNTER — Encounter (HOSPITAL_COMMUNITY): Payer: Self-pay | Admitting: Orthopedic Surgery

## 2015-06-01 DIAGNOSIS — S72142A Displaced intertrochanteric fracture of left femur, initial encounter for closed fracture: Principal | ICD-10-CM

## 2015-06-01 DIAGNOSIS — D649 Anemia, unspecified: Secondary | ICD-10-CM

## 2015-06-01 LAB — CBC
HEMATOCRIT: 27.4 % — AB (ref 36.0–46.0)
HEMOGLOBIN: 8.3 g/dL — AB (ref 12.0–15.0)
MCH: 28.4 pg (ref 26.0–34.0)
MCHC: 30.3 g/dL (ref 30.0–36.0)
MCV: 93.8 fL (ref 78.0–100.0)
Platelets: 192 10*3/uL (ref 150–400)
RBC: 2.92 MIL/uL — ABNORMAL LOW (ref 3.87–5.11)
RDW: 14.6 % (ref 11.5–15.5)
WBC: 8.1 10*3/uL (ref 4.0–10.5)

## 2015-06-01 LAB — GLUCOSE, CAPILLARY: GLUCOSE-CAPILLARY: 104 mg/dL — AB (ref 65–99)

## 2015-06-01 LAB — BASIC METABOLIC PANEL
Anion gap: 9 (ref 5–15)
BUN: 27 mg/dL — AB (ref 6–20)
CHLORIDE: 111 mmol/L (ref 101–111)
CO2: 26 mmol/L (ref 22–32)
CREATININE: 0.97 mg/dL (ref 0.44–1.00)
Calcium: 8.8 mg/dL — ABNORMAL LOW (ref 8.9–10.3)
GFR calc Af Amer: 60 mL/min — ABNORMAL LOW (ref >60)
GFR, EST NON AFRICAN AMERICAN: 51 mL/min — AB (ref >60)
Glucose, Bld: 117 mg/dL — ABNORMAL HIGH (ref 65–99)
POTASSIUM: 4.5 mmol/L (ref 3.5–5.1)
Sodium: 146 mmol/L — ABNORMAL HIGH (ref 135–145)

## 2015-06-01 MED ORDER — ACETAMINOPHEN 325 MG PO TABS: 650.0000 mg | ORAL_TABLET | Freq: Four times a day (QID) | ORAL | Status: DC | PRN

## 2015-06-01 MED ORDER — CEFUROXIME AXETIL 500 MG PO TABS
500.0000 mg | ORAL_TABLET | Freq: Two times a day (BID) | ORAL | Status: DC
  Administered 2015-06-02 – 2015-06-03 (×3): 500 mg via ORAL
  Filled 2015-06-01 (×5): qty 1

## 2015-06-01 MED ORDER — SODIUM CHLORIDE 0.9 % IV SOLN
INTRAVENOUS | Status: DC
  Administered 2015-06-02 (×2): via INTRAVENOUS

## 2015-06-01 MED ORDER — TRAMADOL HCL 50 MG PO TABS: 50.0000 mg | ORAL_TABLET | Freq: Four times a day (QID) | ORAL | Status: DC | PRN

## 2015-06-01 MED ORDER — ASPIRIN EC 325 MG PO TBEC: 325.0000 mg | DELAYED_RELEASE_TABLET | Freq: Two times a day (BID) | ORAL | Status: DC

## 2015-06-01 NOTE — Op Note (Signed)
Briana Thornton, Briana Thornton NO.:  [id number]  MEDICAL RECORD NO.:  [id number]  LOCATION:  [id number]                         FACILITY:  [facility]  PHYSICIAN:  Rod Can, MD     DATE OF BIRTH:  1926-07-20  DATE OF PROCEDURE:  05/31/2015 DATE OF DISCHARGE:                              OPERATIVE REPORT   PREOPERATIVE DIAGNOSIS:  Left intertrochanteric femur fracture.  POSTOPERATIVE DIAGNOSIS:  Left intertrochanteric femur fracture.  PROCEDURE PERFORMED:  Intramedullary fixation of left intertrochanteric femur fracture.  SURGEON:  Rod Can, M.D.  ASSISTANT:  None.  IMPLANTS: 1. TFN nail 11 x 170 mm, 125 degrees. 2. TFNA helical blade 90 mm. 3. 5 mm distal interlocking screw x1.  ANESTHESIA:  Spinal.  ESTIMATED BLOOD LOSS:  75 mL.  TUBES AND DRAINS:  None.  COMPLICATIONS:  None.  SPECIMENS:  None.  DISPOSITION:  Stable to PACU.  ANTIBIOTICS:  2 g of Ancef.  INDICATIONS:  The patient is an 82 year old female, who is the mother of one of the orthopedic nurses.  She was recently discharged from inpatient rehab due to a stroke.  She had a ground level fall last night while ambulating to the bathroom.  She had left hip pain and inability to weight bear.  She was brought to the hospital, where x-rays revealed an intertrochanteric femur fracture on the left side.  She was admitted to the hospitalist service, underwent perioperative risk stratification, medical optimization.  Risks, benefits, alternatives to surgical fixation were explained, and the family elected to proceed.  DESCRIPTION OF PROCEDURE IN DETAIL:  I identified the patient in the holding area using 2 identifiers.  I marked the surgical site.  She was taken to the operating room.  Spinal anesthesia was induced.  She was then transferred to the [facility] table.  The right lower extremity was scissored underneath the left.  I reduced the fracture with traction, internal rotation, and adduction.  The  left hip was prepped and draped in normal sterile surgical fashion.  Time out was called verifying site and side of surgery.  I began by making a 4-cm incision proximal to the tip of the greater trochanter.  I used the awl to obtain the standard starting point for trochanteric entry nail using fluoroscopy.  I then advanced the guide pin.  I then used the entry reamer.  I placed the real nail and used fluoroscopy to sit the nail to the appropriate depth. Through a separate stab incision, I inserted the cannula through the jig for the cephalomedullary device.  I inserted the guide pin under AP and lateral fluoroscopy, and then I measured, drilled, and placed the real helical blade.  I tightened the set screw and then loosened it one- quarter turn, and then I made a separate stab incision and through the jig, I placed the distal interlocking screw.  I removed the jig.  I obtained final AP and lateral fluoroscopy.  She had near anatomic reduction of her fracture.  Tip apex distance was appropriate.  There was no chondral penetration.  I copiously irrigated the wounds.  I closed the wounds in layers with #1 Vicryl for the fascia, 2-0 Monocryl for the deep  dermal layer, running 3-0 Monocryl subcuticular stitch. Glue was applied to the skin.  Once the glue was hardened, sterile dressings were applied.  She was then aroused from anesthesia, transferred to the stretcher and taken to the PACU in stable condition. Sponge, needle, and instrument counts were correct at the end of the case x2.  There were no known complications.  We will readmit the patient to the hospitalist.  She may weight bear as tolerated with a walker.  We will place her on full strength aspirin twice a day for DVT prophylaxis.  We will work on disposition planning, and she will have physical and occupational therapy.  All questions solicited and answered.          ______________________________ Rod Can,  MD     BS/MEDQ  D:  05/31/2015  T:  05/31/2015  Job:  [id number]

## 2015-06-01 NOTE — Progress Notes (Signed)
  PROGRESS NOTE  Briana Thornton [id number] DOB: 08/21/1926 DOA: 05/30/2015 PCP: Penni Homans, MD  Summary: 68yow presented after fall; found to have left hip fx. Underwent successful operation 1/3.  Assessment/Plan: 1. Left hip fx. S/p operative repair 1/3. Management per orthopedics. 2. ABLA, perioperative. No obvious bleeding. Appears asymptomatic. 3. DM type 2. Last hemoglobin A1c 5.8 in 04/2015 4. Equivocal u/a, possible UTI. Culture pending. 5. S/p TIA 04/2015, s/p CIR 6. PMH pacemaker 7. Stage 2 PrI on sacrum (POA),    Appears stable. Continue management per orthopedics as below. Follow-up anemia and a.m.  CBC in AM  Per ortho: WBAT with walker  Per ortho: DVT ppx: ASA 325mg  PO BID, TEDs, SCDs  Code Status: DNR DVT prophylaxis: as per ortho above Family Communication: Discussed with daughter who is an Therapist, sports at [employer] Disposition Plan: likely SNF  Murray Hodgkins, MD  [facility]  Pager [phone] If 7PM-7AM, please contact night-coverage at [email or web address], password [facility] 06/01/2015, 9:51 AM  LOS: 1 day   Consultants:  Orthopedics  Procedures:  1/3 INTRAMEDULLARY (IM) NAIL INTERTROCHANTRIC (Left)  Antibiotics:  Ceftriaxone 1/3 >>  HPI/Subjective: Feels okay. No specific complaints. Daughter [name] at bedside  Objective: Filed Vitals:   06/01/15 0144 06/01/15 0528 06/01/15 0914 06/01/15 0915  BP: 125/42 133/76 133/51 133/51  Pulse:      Temp: 98.3 F (36.8 C) 98.2 F (36.8 C) 99.1 F (37.3 C)   TempSrc: Oral Oral Oral   Resp: 16 16 16    Height:      Weight:      SpO2: 99% 97% 99%     Intake/Output Summary (Last 24 hours) at 06/01/15 0951 Last data filed at 06/01/15 0528  Gross per 24 hour  Intake 1205.25 ml  Output    825 ml  Net 380.25 ml     Filed Weights   05/31/15 0300 05/31/15 1833  Weight: 48.081 kg (106 lb) 48 kg (105 lb 13.1 oz)    Exam:    General:  Appears calm and comfortable lying in bed Cardiovascular: RRR, no m/r/g. No LE  edema. Respiratory: CTA bilaterally, no w/r/r. Normal respiratory effort. Musculoskeletal: grossly normal tone BUE/BLE Psychiatric: grossly normal mood and affect, speech fluent and appropriate  New data reviewed:  BMP unremarkable  HGb 11.6 >> 8.3  U/A equivocal  Pending data:  Urine culture  Scheduled Meds: . acetaminophen  1,000 mg Oral 4 times per day  . acidophilus  1 capsule Oral Daily  . aspirin EC  325 mg Oral BID PC  . atorvastatin  10 mg Oral q1800  . cefTRIAXone (ROCEPHIN)  IV  1 g Intravenous Q24H  . donepezil  10 mg Oral QHS  . feeding supplement (ENSURE ENLIVE)  237 mL Oral Daily  . ferrous sulfate  325 mg Oral Q breakfast  . lisinopril  5 mg Oral Daily   Continuous Infusions: . sodium chloride 75 mL/hr at 05/31/15 [id number]    Principal Problem:   Intertrochanteric fracture of left hip (HCC) Active Problems:   Essential hypertension   Hyperlipidemia   DM (diabetes mellitus) (HCC)   Pacemaker-St.Jude   Anemia   Hip fracture (HCC)   Fracture, intertrochanteric, left femur (HCC)   Time spent 20 minutes

## 2015-06-01 NOTE — Evaluation (Signed)
Occupational Therapy Evaluation Patient Details Name: Briana Thornton MRN: [id number] DOB: July 07, 1926 Today's Date: 06/01/2015    History of Present Illness Briana Thornton is a 82 y.o. right handed female with history of hypertension, diabetes mellitus with peripheral neuropathy,CHB with pacemaker, dementia maintained on Aricept. She has presented after fall; found to have left hip fx. Underwent INTRAMEDULLARY (IM) NAIL INTERTROCHANTRIC. Pt is not WBAT with RW.    Clinical Impression   Plan is for patient to discharge to SNF. No acute OT needs identified, all needs can be met in SNF. Please send text page to OT services if any questions, concerns, or with new orders: [phone] OR call office at [phone]. Thank you for the order.      Follow Up Recommendations  SNF;Supervision/Assistance - 24 hour    Equipment Recommendations  Other (comment) (defer to next venue of care)    Recommendations for Other Services  None at this time    Precautions / Restrictions Precautions Precautions: Fall Restrictions Weight Bearing Restrictions: Yes LLE Weight Bearing: Weight bearing as tolerated    Mobility Bed Mobility Overal bed mobility: Needs Assistance Bed Mobility: Supine to Sit;Sit to Supine     Supine to sit: Max assist;+2 for physical assistance;HOB elevated Sit to supine: Max assist;+2 for physical assistance   General bed mobility comments: Pt lethargic which limited her independence with bed mobility. Multimodal cueing and assistance needed for BLE management and trunk control. Pad used for positioning.   Transfers Overall transfer level: Needs assistance Equipment used: Rolling walker (2 wheeled) Transfers: Sit to/from Stand Sit to Stand: Mod assist;+2 physical assistance;+2 safety/equipment         General transfer comment: Multimodal cueing for hand placement, sequencing, technique for sit to/from stand from EOB. Pt only able to stand ~1 minute before needing to  sit back down.     Balance Overall balance assessment: Needs assistance Sitting-balance support: No upper extremity supported;Feet supported Sitting balance-[name]: Fair   Postural control: Right lateral lean Standing balance support: Bilateral upper extremity supported;During functional activity Standing balance-[name]: Poor    ADL Overall ADL's : Needs assistance/impaired   Eating/Feeding Details (indicate cue type and reason): did not occur   Grooming Details (indicate cue type and reason): did not occur, pt too lethargic    Upper Body Bathing Details (indicate cue type and reason): did not occur, pt too lethargic    Lower Body Bathing Details (indicate cue type and reason): did not occur, pt too lethargic    Upper Body Dressing Details (indicate cue type and reason): did not occur, pt too lethargic    Lower Body Dressing Details (indicate cue type and reason): did not occur, pt too lethargic    Toilet Transfer Details (indicate cue type and reason): did not occur, pt too lethargic    Toileting - Clothing Manipulation Details (indicate cue type and reason): did not occur, pt too lethargic        General ADL Comments: Limited OT eval due to patient too lethargic. As of now, pt overall total assist for ADLs and requires max assist +2 for bed mobility and mod assist +2 for functional sit to/from stand using RW. Pt with difficulty following commands, taking increased time and needing multimodal cueing.     Vision Additional Comments: Unable to assess due to patient too lethargic and barely able to keep eyes open          Pertinent Vitals/Pain Pain Assessment: Faces Faces Pain Scale: Hurts little  more Pain Location: LLE during mobility  Pain Descriptors / Indicators: Grimacing;Guarding Pain Intervention(s): Monitored during session;Limited activity within patient's tolerance;Repositioned     Hand Dominance Left   Extremity/Trunk Assessment Upper Extremity  Assessment Upper Extremity Assessment: Generalized weakness (right weaker than left)   Lower Extremity Assessment Lower Extremity Assessment: Defer to PT evaluation   Cervical / Trunk Assessment Cervical / Trunk Assessment: Kyphotic   Communication Communication Communication: Expressive difficulties   Cognition Arousal/Alertness: Lethargic Behavior During Therapy: WFL for tasks assessed/performed;Flat affect Overall Cognitive Status: History of cognitive impairments - at baseline (no family or caregiver present)              Home Living Family/patient expects to be discharged to:: Skilled nursing facility Living Arrangements: Children Available Help at Discharge: Family;Available 24 hours/day Type of Home: House Home Access: Stairs to enter [employer] of Steps: 5-6 Entrance Stairs-Rails: Left Home Layout: One level;Able to live on main level with bedroom/bathroom     Bathroom Shower/Tub: Other (comment) (walk-in tub)   Bathroom Toilet: Handicapped height (elevated with BSC) Bathroom Accessibility: Yes How Accessible: Accessible via wheelchair;Accessible via walker Home Equipment: [place] - 2 wheels;Wheelchair - Brewing technologist - built in   Additional Comments: Recently discharged from [facility] skilled nursing facility for pneumonia. Presented 05/16/2015 with right sided weakness 1 week. Cranial CT scan shows early left pontine infarct as well as remote basal ganglia infarcts bilaterally, remote left corona radiata infarct. Pt on CIR until 12/31 when discharged back home.  Lives With: Daughter;Other (Comment) (SIL, grandson, granddaughter-in-law, and their new baby)    Prior Functioning/Environment Level of Independence: Needs assistance    ADL's / Homemaking Assistance Needed: Pt overall min assist when leaving CIR    Comments: uses depends at night - entered from previous admission     OT Diagnosis: Generalized weakness;Acute pain   OT Problem  List: Decreased strength;Impaired balance (sitting and/or standing);Decreased activity tolerance;Pain;Impaired UE functional use;Decreased knowledge of use of DME or AE;Decreased knowledge of precautions;Decreased range of motion;Decreased safety awareness   OT Treatment/Interventions:   N/a, no acute OT needs identified at this time. [facility] needs [facility] be [facility] at [facility].    OT Goals(Current goals can be found in the care plan section) Acute Rehab OT Goals Patient Stated Goal: none stated OT Goal Formulation:  (defer to SNF)  OT Frequency:   N/a, no acute OT needs identified at this time. All needs can be met at [facility].    Barriers to D/C:   N/a, no acute OT needs identified at this time. All needs can be met at [facility].        Co-evaluation PT/OT/SLP Co-Evaluation/Treatment: Yes Reason for Co-Treatment: For patient/therapist safety   OT goals addressed during session: ADL's and self-care;Strengthening/ROM      End of Session Equipment Utilized During Treatment: Rolling walker;Gait belt  Activity Tolerance: Patient limited by lethargy Patient left: in bed;with call bell/phone within reach;with bed alarm set   Time: 9702-6378 OT Time Calculation (min): 25 min Charges:  OT General Charges $OT Visit: 1 Procedure OT Evaluation $OT Eval Moderate Complexity: 1 Procedure  Chrys Racer , MS, OTR/L, CLT Pager: [phone]  06/01/2015, 12:48 PM

## 2015-06-01 NOTE — NC FL2 (Signed)
[place] LEVEL OF CARE SCREENING TOOL     IDENTIFICATION  Patient Name: Briana Thornton Birthdate: Dec 14, 1926 Sex: female Admission Date (Current Location): 05/30/2015  [facility] and [place] Number:  Herbalist and Address:  [facility],  [place], [place]      Provider Number: [id number]  Attending Physician Name and Address:  Samuella Cota, MD  Relative Name and Phone Number:       Current Level of Care: Hospital Recommended Level of Care: [place] Prior Approval Number:    Date Approved/Denied:   PASRR Number: [id number]  Discharge Plan: SNF    Current Diagnoses: Patient Active Problem List   Diagnosis Date Noted  . Femur fracture, left ([place]) 05/31/2015  . Intertrochanteric fracture of left hip ([place]) 05/31/2015  . Fracture, intertrochanteric, left femur ([place]) 05/31/2015  . Absolute anemia   . Labile blood pressure   . Hypoalbuminemia due to protein-calorie malnutrition ([place])   . Malnutrition of moderate degree 05/18/2015  . TIA (transient ischemic attack) 05/18/2015  . Left pontine cerebrovascular accident ([place]) 05/18/2015  . DM type 2 with diabetic peripheral neuropathy ([place]) 05/18/2015  . CAP (community acquired pneumonia) 03/05/2015  . SOB (shortness of breath) 03/05/2015  . Hypoxia 03/05/2015  . Weakness 03/05/2015  . Dehydration, mild 03/05/2015  . Controlled type 2 diabetes mellitus with diabetic nephropathy, without long-term current use of insulin ([place])   . Onychomycosis 10/31/2014  . UTI symptoms 01/25/2014  . Dysphagia 01/25/2014  . Abnormal finding on urinalysis 01/25/2014  . Dementia without behavioral disturbance 08/06/2013  . Postoperative anemia due to acute blood loss 07/26/2013  . Hip fracture ([place]) 07/24/2013  . Thyroid mass of unclear etiology 07/24/2013  . Fall at home 07/24/2013  . Cerumen impaction 04/21/2013  . Cognitive decline 02/15/2013  . Anemia 05/22/2012   . Depression 05/22/2012  . Dermatitis 05/22/2012  . Broken wrist   . Pacemaker-St.Jude 09/18/2011  . URI (upper respiratory infection) 08/31/2011  . Lymph node enlargement 08/31/2011  . Weight loss 07/31/2011  . Cardiovascular system disease 07/02/2011  . Hemorrhoid 07/02/2011  . Osteoporosis 07/02/2011  . Essential hypertension   . Hyperlipidemia   . IBS (irritable bowel syndrome)   . DM (diabetes mellitus) ([place])   . Calcification of cartilage   . BPPV (benign paroxysmal positional vertigo)   . Vision loss of right eye     Orientation RESPIRATION BLADDER Height & Weight    Place, Self  Normal Continent 5\' 2"  (157.5 cm) 106 lbs.  BEHAVIORAL SYMPTOMS/MOOD NEUROLOGICAL BOWEL NUTRITION STATUS  Other (Comment) (no behaviors)   Continent Diet  AMBULATORY STATUS COMMUNICATION OF NEEDS Skin   Extensive Assist Verbally Surgical wounds, Other (Comment) (stage 2 ulcer on sacrum)                       Personal Care Assistance Level of Assistance  Bathing, Feeding, Dressing Bathing Assistance: Limited assistance Feeding assistance: Independent Dressing Assistance: Limited assistance     Functional Limitations Info  Sight, Hearing, Speech Sight Info: Adequate Hearing Info: Adequate Speech Info: Adequate    SPECIAL CARE FACTORS FREQUENCY  PT (By licensed PT), OT (By licensed OT)     PT Frequency: 5 x wk OT Frequency: 5 x wk            Contractures Contractures Info: Not present    Additional Factors Info  Code Status Code Status Info: Full Code  Current Medications (06/01/2015):  This is the current hospital active medication list Current Facility-Administered Medications  Medication Dose Route Frequency Provider Last Rate Last Dose  . acetaminophen (TYLENOL) tablet 650 mg  650 mg Oral Q6H PRN Rod Can, MD       Or  . acetaminophen (TYLENOL) suppository 650 mg  650 mg Rectal Q6H PRN Rod Can, MD      . acetaminophen (TYLENOL) tablet  1,000 mg  1,000 mg Oral 4 times per day Rod Can, MD   1,000 mg at 06/01/15 0646  . acidophilus (RISAQUAD) capsule 1 capsule  1 capsule Oral Daily Norval Morton, MD   1 capsule at 06/01/15 0912  . alum & mag hydroxide-simeth (MAALOX/MYLANTA) 200-200-20 MG/5ML suspension 30 mL  30 mL Oral Q6H PRN Norval Morton, MD      . aspirin EC tablet 325 mg  325 mg Oral BID PC Rod Can, MD   325 mg at 06/01/15 0912  . atorvastatin (LIPITOR) tablet 10 mg  10 mg Oral q1800 Norval Morton, MD      . Derrill Memo ON 06/02/2015] cefUROXime (CEFTIN) tablet 500 mg  500 mg Oral BID WC Samuella Cota, MD      . donepezil (ARICEPT) tablet 10 mg  10 mg Oral QHS Norval Morton, MD   10 mg at 05/31/15 2126  . feeding supplement (ENSURE ENLIVE) (ENSURE ENLIVE) liquid 237 mL  237 mL Oral Daily Norval Morton, MD   237 mL at 06/01/15 0807  . ferrous sulfate tablet 325 mg  325 mg Oral Q breakfast Norval Morton, MD   Stopped at 06/01/15 0913  . ipratropium-albuterol (DUONEB) 0.5-2.5 (3) MG/3ML nebulizer solution 3 mL  3 mL Inhalation Q6H PRN Norval Morton, MD      . lisinopril (PRINIVIL,ZESTRIL) tablet 5 mg  5 mg Oral Daily Norval Morton, MD   5 mg at 06/01/15 0915  . menthol-cetylpyridinium (CEPACOL) lozenge 3 mg  1 lozenge Oral PRN Rod Can, MD       Or  . phenol (CHLORASEPTIC) mouth spray 1 spray  1 spray Mouth/Throat PRN Rod Can, MD      . morphine 2 MG/ML injection 0.5 mg  0.5 mg Intravenous Q2H PRN Rod Can, MD   0.5 mg at 05/31/15 1859  . ondansetron (ZOFRAN) tablet 4 mg  4 mg Oral Q6H PRN Rod Can, MD       Or  . ondansetron (ZOFRAN) injection 4 mg  4 mg Intravenous Q6H PRN Rod Can, MD      . polyethylene glycol (MIRALAX / GLYCOLAX) packet 17 g  17 g Oral Daily PRN Norval Morton, MD      . polyvinyl alcohol (LIQUIFILM TEARS) 1.4 % ophthalmic solution 1 drop  1 drop Both Eyes PRN Norval Morton, MD      . traMADol Veatrice Bourbon) tablet 50 mg  50 mg Oral Q6H PRN Norval Morton, MD   50 mg at 06/01/15 [id number]     Discharge Medications: Please see discharge summary for a list of discharge medications.  Relevant Imaging Results:  Relevant Lab Results:   Additional Information SS # [id number]. Surgical pcr+ MRSA  05/31/15.  Aaren Krog, Randall An, LCSW

## 2015-06-01 NOTE — Progress Notes (Signed)
   Subjective:  Patient reports pain as mild to moderate.  No c/o.  Objective:   VITALS:   Filed Vitals:   05/31/15 2118 05/31/15 2302 06/01/15 0144 06/01/15 0528  BP: 104/63 117/47 125/42 133/76  Pulse:      Temp: 98.3 F (36.8 C) 98.5 F (36.9 C) 98.3 F (36.8 C) 98.2 F (36.8 C)  TempSrc: Oral Oral Oral Oral  Resp: 16 16 16 16   Height:      Weight:      SpO2: 99% 98% 99% 97%    ABD soft Sensation intact distally Intact pulses distally Dorsiflexion/Plantar flexion intact Incision: dressing C/D/I Compartment soft   Lab Results  Component Value Date   WBC 8.1 06/01/2015   HGB 8.3* 06/01/2015   HCT 27.4* 06/01/2015   MCV 93.8 06/01/2015   PLT 192 06/01/2015   BMET    Component Value Date/Time   NA 146* 06/01/2015 0520   K 4.5 06/01/2015 0520   CL 111 06/01/2015 0520   CO2 26 06/01/2015 0520   GLUCOSE 117* 06/01/2015 0520   BUN 27* 06/01/2015 0520   CREATININE 0.97 06/01/2015 0520   CREATININE 1.07 01/06/2014 1100   CALCIUM 8.8* 06/01/2015 0520   GFRNONAA 51* 06/01/2015 0520   GFRNONAA 47* 01/06/2014 1100   GFRAA 60* 06/01/2015 0520   GFRAA 55* 01/06/2014 1100     Assessment/Plan: 1 Day Post-Op   Principal Problem:   Intertrochanteric fracture of left hip (HCC) Active Problems:   Essential hypertension   Hyperlipidemia   DM (diabetes mellitus) ([place])   Pacemaker-St.Jude   Hip fracture (HCC)   Fracture, intertrochanteric, left femur ([place])    WBAT with walker DVT ppx: ASA 325mg  PO BID, TEDs, SCDs PO pain control: avoid narcotics UTI per hospitalists PT/OT Dispo: d/c planning    Sheena Simonis, Horald Pollen 06/01/2015, 6:53 AM   Rod Can, MD Cell [phone]

## 2015-06-01 NOTE — Care Management Note (Signed)
Case Management Note  Patient Details  Name: Wylee Rivere MRN: [id number] Date of Birth: 05-Jan-1927  Subjective/Objective:  82 y.o. F admitted 05/30/2015 after unwitnessed fall when she suffered IT Femur Fracture. Usually ambulates Independently with Walker. pt resides with her Daughter and her family. S/p L Pontine Infarct            Action/Plan: Anticipate discharge to [facility] when ready for discharge. Disposition deferred to SW. CM will continue to follow should additional disposition needs arise.  / Expected Discharge Date:                  Expected Discharge Plan:  [place]  In-House Referral:     Discharge planning Services  CM Consult  Post Acute Care Choice:    Choice offered to:     DME Arranged:    DME Agency:     HH Arranged:    [place] Agency:     Status of Service:  In process, will continue to follow  Medicare Important Message Given:    Date Medicare IM Given:    Medicare IM give by:    Date Additional Medicare IM Given:    Additional Medicare Important Message give by:     If discussed at [place] of Stay Meetings, dates discussed:    Additional Comments:  Delrae Sawyers, RN 06/01/2015, 11:13 AM

## 2015-06-01 NOTE — Evaluation (Signed)
Physical Therapy Evaluation Patient Details Name: Briana Thornton MRN: [id number] DOB: 10/30/1926 Today's Date: 06/01/2015   History of Present Illness  Briana Thornton is a 82 y.o. right handed female with history of hypertension, diabetes mellitus with peripheral neuropathy,CHB with pacemaker, dementia maintained on Aricept. Pt presented after fall; found to have left hip fx. Underwent INTRAMEDULLARY (IM) NAIL INTERTROCHANTRIC. Pt is not WBAT with RW.   Clinical Impression  Pt admitted as above and presenting with functional mobility limitations 2* decreased L LE strength/ROM, post op pain, generalized weakness and deconditioning, and cognitive deficits.  Pt would benefit from follow up rehab at SNF level.    Follow Up Recommendations SNF    Equipment Recommendations  None recommended by PT    Recommendations for Other Services OT consult     Precautions / Restrictions Precautions Precautions: Fall Precaution Comments: R side weakness Restrictions Weight Bearing Restrictions: No LLE Weight Bearing: Weight bearing as tolerated      Mobility  Bed Mobility Overal bed mobility: Needs Assistance Bed Mobility: Supine to Sit;Sit to Supine     Supine to sit: Max assist;+2 for physical assistance;HOB elevated Sit to supine: Max assist;+2 for physical assistance   General bed mobility comments: Pt lethargic which limited her independence with bed mobility. Multimodal cueing and assistance needed for BLE management and trunk control. Pad used for positioning.   Transfers Overall transfer level: Needs assistance Equipment used: Rolling walker (2 wheeled) Transfers: Sit to/from Stand Sit to Stand: Mod assist;+2 physical assistance;+2 safety/equipment         General transfer comment: Multimodal cueing for hand placement, sequencing, technique for sit to/from stand from EOB. Pt only able to stand ~1 minute before needing to sit back down.   Ambulation/Gait             General  Gait Details: Pt stood at bedside only - ltd by pt fatigue  Stairs            Wheelchair Mobility    Modified Rankin (Stroke Patients Only)       Balance Overall balance assessment: Needs assistance Sitting-balance support: No upper extremity supported;Feet supported Sitting balance-[name]: Fair   Postural control: Right lateral lean Standing balance support: Bilateral upper extremity supported;During functional activity Standing balance-[name]: Poor                               Pertinent Vitals/Pain Pain Assessment: Faces Faces Pain Scale: Hurts little more Pain Location: LLE during mobility  Pain Descriptors / Indicators: Grimacing;Guarding Pain Intervention(s): Monitored during session;Limited activity within patient's tolerance;Repositioned    Home Living Family/patient expects to be discharged to:: Skilled nursing facility Living Arrangements: Children Available Help at Discharge: Family;Available 24 hours/day Type of Home: House Home Access: Stairs to enter Entrance Stairs-Rails: Left Entrance Stairs-Number of Steps: 5-6 Home Layout: One level;Able to live on main level with bedroom/bathroom Home Equipment: Gilford Rile - 2 wheels;Wheelchair - Brewing technologist - built in Additional Comments: Recently discharged from [facility] skilled nursing facility for pneumonia. Presented 05/16/2015 with right sided weakness 1 week. Cranial CT scan shows early left pontine infarct as well as remote basal ganglia infarcts bilaterally, remote left corona radiata infarct. Pt on CIR until 12/31 when discharged back home.    Prior Function Level of Independence: Needs assistance      ADL's / Homemaking Assistance Needed: Pt overall min assist when leaving CIR   Comments: uses depends at night -  entered from previous admission      Hand Dominance   Dominant Hand: Left    Extremity/Trunk Assessment   Upper Extremity Assessment: Generalized weakness  (right weaker than left)           Lower Extremity Assessment: Generalized weakness;LLE deficits/detail;RLE deficits/detail;Difficult to assess due to impaired cognition (Pt not following cues well enough for accurate assess ) RLE Deficits / Details: AAROM WFL except ankle DF _40 degrees (tight heel cords); quads 3+ LLE Deficits / Details: ankle DF ltd by 40 degrees, with AAROM to 90 flex at hip and 20 abd  Cervical / Trunk Assessment: Kyphotic  Communication   Communication: Expressive difficulties  Cognition Arousal/Alertness: Lethargic Behavior During Therapy: WFL for tasks assessed/performed;Flat affect Overall Cognitive Status: History of cognitive impairments - at baseline (no family or caregiver present)                      General Comments      Exercises General Exercises - Lower Extremity Ankle Circles/Pumps: AAROM;Both;10 reps;Supine Heel Slides: AAROM;Both;10 reps;Supine Hip ABduction/ADduction: AAROM;Both;10 reps;Supine      Assessment/Plan    PT Assessment Patient needs continued PT services  PT Diagnosis Difficulty walking;Generalized weakness   PT Problem List Decreased strength;Decreased range of motion;Decreased activity tolerance;Decreased balance;Decreased mobility;Decreased cognition;Decreased knowledge of use of DME;Decreased safety awareness;Pain  PT Treatment Interventions DME instruction;Gait training;Functional mobility training;Therapeutic activities;Therapeutic exercise;Balance training;Cognitive remediation;Patient/family education   PT Goals (Current goals can be found in the Care Plan section) Acute Rehab PT Goals Patient Stated Goal: none stated PT Goal Formulation: Patient unable to participate in goal setting Time For Goal Achievement: 05/31/15 Potential to Achieve Goals: Good    Frequency Min 3X/week   Barriers to discharge        Co-evaluation PT/OT/SLP Co-Evaluation/Treatment: Yes Reason for Co-Treatment: For  patient/therapist safety PT goals addressed during session: Mobility/safety with mobility OT goals addressed during session: ADL's and self-care       End of Session Equipment Utilized During Treatment: Gait belt Activity Tolerance: Patient limited by fatigue;Patient tolerated treatment well Patient left: in bed;with call bell/phone within reach;with bed alarm set Nurse Communication: Mobility status         Time: [id number] PT Time Calculation (min) (ACUTE ONLY): 25 min   Charges:   PT Evaluation $PT Eval Moderate Complexity: 1 Procedure     PT G Codes:        Jachin Coury 06/08/2015, 1:12 PM

## 2015-06-01 NOTE — Progress Notes (Signed)
BSE completed, full report to follow.  SLP spoke to pt's daughter [name] re: pt's swallowing and recommend modify diet to mitigate aspiration/dysphagia but suspect aspiration present. [name] agreeable - Pt also lethargic currently but did remain alert and consume small amount of po with moderate cues.  Will follow up to determine readiness for repeat MBS (hopefully tomorrow if daughter and MD agree and pt is more alert).  Full report to follow.  Luanna Salk, [facility] [facility] SLP [phone]

## 2015-06-01 NOTE — Evaluation (Addendum)
Clinical/Bedside Swallow Evaluation Patient Details  Name: Briana Thornton MRN: [id number] Date of Birth: [birth year]  Today's Date: 06/01/2015 Time: SLP Start Time (ACUTE ONLY): 52 SLP Stop Time (ACUTE ONLY): 1305 SLP Time Calculation (min) (ACUTE ONLY): 35 min  Past Medical History:  Past Medical History  Diagnosis Date  . Chicken pox as a child  . Measles as a child  . IBS (irritable bowel syndrome)   . Diabetes mellitus 47    type 2  . Hyperlipidemia 72  . Hypertension 72  . Vision loss of right eye   . Vertigo 2010    benign  . Calcification of cartilage     ear  . Cardiovascular system disease 07/02/2011  . Hemorrhoid 07/02/2011  . Osteoporosis 07/02/2011  . Weight loss 07/31/2011  . Anemia 05/22/2012  . Broken wrist 10-22-11    left  . Depression 05/22/2012  . Dermatitis 05/22/2012    Right neck  . DM type 2 with diabetic peripheral neuropathy ([place]) 05/18/2015   Past Surgical History:  Past Surgical History  Procedure Laterality Date  . Hemorroidectomy  1979  . Knee scoped  2002    right  . Rotator cuff repair  2004    right  . Abdominal hysterectomy  2008    partial still has ovaries  . Lens implant left  2006  . Pacemaker insertion  2010  . Wrist surgery  11-05-11    left wrist  . Intramedullary (im) nail intertrochanteric Right 07/24/2013    Procedure: INTRAMEDULLARY (IM) NAIL INTERTROCHANTRIC;  Surgeon: Gearlean Alf, MD;  Location: [facility] ORS;  Service: Orthopedics;  Laterality: Right;  . Intramedullary (im) nail intertrochanteric Left 05/31/2015    Procedure: INTRAMEDULLARY (IM) NAIL INTERTROCHANTRIC;  Surgeon: Rod Can, MD;  Location: [facility] ORS;  Service: Orthopedics;  Laterality: Left;   HPI:  82 y.o. female with a past medical history significant for recent pna with rehab stay, NIDDM, HTN, CHB with pacemaker, dementia, dysphagia, possible left pontine infarct and new fall with fx - s/p surgery.  Pt had recently discharged from rehab and was at home. Daughter  reports she was doing well and dressed herself on date of fall.  Swallow evaluation ordered.  CXR upon admit negative.    Assessment / Plan / Recommendation Clinical Impression  Pt presents with symptoms of mild oral and moderately severe pharyngeal/cervical esophageal dysphagia.  Pt's neck extension also does not help airway protection = she is physically resistant to repositioning but towel behind her head to maximize proper positioning.    Pt is grossly weak with intermittent wet vocal quality that she can clear acutely with cued throat clearing/cough. Weakness likely adding to pt's current dysphagia but pt was able to follow directions and agreeable to minimal po - despite saying she was not hungry.  Multiple AUDIBLE swallows noted across all consistencies - 11 swallows with nectar via straw followed by immediate coughing/throat clearing.  Pt tolerating thin and nectar via tsp better than cup/straw likely due to smaller bolus size, however concern for aspiration remains.  Did not test solids due to concerns for amount of pharyngeal residuals.    Pt's cough is weak and unfortunately she did not expectorate secretions with reflexive coughing.   RN reports pt's lungs sound clear and congestion sounds noted in pharyngeal region.   SLP spoke with [name], daughter and RN here at [facility] regarding results of evaluation.  [name] states pt has had a progressive decline and she admits to pt having gurgling at night  at times requiring cues to swallow.  Pt also demonstrated very audible swallow with intake prior to admit per [name].    Suspect baseline dysphagia with significant exacerbation from this medical event.  Advised [name] to SLP inability to warranty airway protection with intake but modification of diet and strict aspiration precautions may mitigate risk.  [name] agreeable to modifying diet - SLP does not recommend MBS today as pt is sleepy and just underwent surgery last pm.    Hopeful pt may be able to  participate in MBS next date with improved mental status.  Spoke to [facility] and advised her to recommendation for MBS next date if pt more alert - she agreed.    Advised RN Amy to provide pt minimal amounts of intake with very strict precautions and d/c intake if pt poorly tolerating/coughing/etc.  SLP to follow up.     Aspiration Risk  Severe aspiration risk    Diet Recommendation  (full liquids via tsp only with accepted risks)   Liquid Administration via: Spoon Medication Administration: Crushed with puree Supervision: Full supervision/cueing for compensatory strategies Compensations: Slow rate;Small sips/bites;Clear throat intermittently (multiple swallows, allow time for extra swallows) Postural Changes: Seated upright at 90 degrees;Remain upright for at least 30 minutes after po intake    Other  Recommendations Oral Care Recommendations: Oral care QID Other Recommendations: Have oral suction available (advised RN to set up oral suction)   Follow up Recommendations   (TBD)    Frequency and Duration min 2x/week  1 week       Prognosis Prognosis for Safe Diet Advancement: Guarded Barriers to Reach Goals: Time post onset;Severity of deficits;Cognitive deficits;Other (Comment) (gross weakness)      Swallow Study   General Date of Onset: 06/01/15 HPI: 82 y.o. female with a past medical history significant for recent pna with rehab stay, NIDDM, HTN, CHB with pacemaker, dementia, dysphagia, possible left pontine infarct and new fall with fx - s/p surgery.  Pt had recently discharged from rehab and was at home. Daughter reports she was doing well and dressed herself on date of fall.  Swallow evaluation ordered.  CXR upon admit negative.  Type of Study: Bedside Swallow Evaluation Diet Prior to this Study: Regular;Thin liquids Temperature Spikes Noted: No Respiratory Status: Room air History of Recent Intubation: No Behavior/Cognition: Lethargic/Drowsy;Requires cueing;Doesn't follow  directions (delays in responses, daughter [name] reports are worse currently than normal) Oral Cavity Assessment: Dry Oral Care Completed by SLP: No Oral Cavity - Dentition: Adequate natural dentition Vision: Functional for self-feeding Self-Feeding Abilities: Total assist Patient Positioning: Upright in bed Baseline Vocal Quality: Wet;Low vocal intensity (daughter reports voice to be gurgly at times frequently prior to admission) Volitional Cough: Weak Volitional Swallow: Able to elicit - poor laryngeal elevation   Oral/Motor/Sensory Function Overall Oral Motor/Sensory Function: Generalized oral weakness (significant generalized weakness)   Ice Chips Ice chips: Not tested   Thin Liquid Thin Liquid: Impaired Presentation: Spoon Oral Phase Impairments: Reduced labial seal Pharyngeal  Phase Impairments: Decreased hyoid-laryngeal movement;Multiple swallows;Cough - Delayed    Nectar Thick Nectar Thick Liquid: Impaired Presentation: Spoon;Straw Pharyngeal Phase Impairments: Decreased hyoid-laryngeal movement;Multiple swallows;Wet Vocal Quality;Throat Clearing - Delayed;Cough - Immediate Other Comments: signifcant coughing after swallow via straw, small tsps tolerated better with immediate indication of airway compromise,  multiple swallows x 11 with single bolus of Ensure via straw - concerning for pharyngeal retention   Honey Thick Honey Thick Liquid: Not tested   Puree Puree: Impaired Presentation: Spoon Oral Phase Impairments: Reduced  labial seal;Reduced lingual movement/coordination Pharyngeal Phase Impairments: Decreased hyoid-laryngeal movement;Multiple swallows;Wet Vocal Quality;Throat Clearing - Delayed Other Comments: 1/2 tsp provided with pt conducting multiple swallows and delayed throat clearing    Solid   GO    Solid: Not tested       Claudie Fisherman, [place] [facility] SLP [phone]

## 2015-06-02 ENCOUNTER — Inpatient Hospital Stay (HOSPITAL_COMMUNITY): Payer: Medicare Other

## 2015-06-02 DIAGNOSIS — D62 Acute posthemorrhagic anemia: Secondary | ICD-10-CM

## 2015-06-02 DIAGNOSIS — S72142D Displaced intertrochanteric fracture of left femur, subsequent encounter for closed fracture with routine healing: Secondary | ICD-10-CM

## 2015-06-02 DIAGNOSIS — R1314 Dysphagia, pharyngoesophageal phase: Secondary | ICD-10-CM

## 2015-06-02 LAB — CBC
HEMATOCRIT: 24.2 % — AB (ref 36.0–46.0)
HEMOGLOBIN: 7.4 g/dL — AB (ref 12.0–15.0)
MCH: 29.1 pg (ref 26.0–34.0)
MCHC: 30.6 g/dL (ref 30.0–36.0)
MCV: 95.3 fL (ref 78.0–100.0)
Platelets: 177 10*3/uL (ref 150–400)
RBC: 2.54 MIL/uL — ABNORMAL LOW (ref 3.87–5.11)
RDW: 14.7 % (ref 11.5–15.5)
WBC: 11.5 10*3/uL — ABNORMAL HIGH (ref 4.0–10.5)

## 2015-06-02 LAB — BASIC METABOLIC PANEL
Anion gap: 7 (ref 5–15)
BUN: 31 mg/dL — AB (ref 6–20)
CALCIUM: 8.7 mg/dL — AB (ref 8.9–10.3)
CHLORIDE: 112 mmol/L — AB (ref 101–111)
CO2: 24 mmol/L (ref 22–32)
CREATININE: 0.95 mg/dL (ref 0.44–1.00)
GFR calc non Af Amer: 53 mL/min — ABNORMAL LOW (ref >60)
GLUCOSE: 94 mg/dL (ref 65–99)
Potassium: 4.1 mmol/L (ref 3.5–5.1)
Sodium: 143 mmol/L (ref 135–145)

## 2015-06-02 LAB — PREPARE RBC (CROSSMATCH)

## 2015-06-02 MED ORDER — HYDRALAZINE HCL 20 MG/ML IJ SOLN: 10.0000 mg | Freq: Three times a day (TID) | INTRAMUSCULAR | Status: DC | PRN

## 2015-06-02 MED ORDER — FERROUS SULFATE 300 (60 FE) MG/5ML PO SYRP
300.0000 mg | ORAL_SOLUTION | Freq: Two times a day (BID) | ORAL | Status: DC
  Administered 2015-06-02 – 2015-06-03 (×3): 300 mg via ORAL
  Filled 2015-06-02 (×4): qty 5

## 2015-06-02 MED ORDER — SODIUM CHLORIDE 0.9 % IV SOLN: Freq: Once | INTRAVENOUS | Status: DC

## 2015-06-02 MED ORDER — STARCH (THICKENING) PO POWD
ORAL | Status: DC | PRN
Start: 2015-06-02 — End: 2015-06-02

## 2015-06-02 MED ORDER — FERROUS SULFATE 220 (44 FE) MG/5ML PO ELIX
220.0000 mg | ORAL_SOLUTION | Freq: Every day | ORAL | Status: DC
  Filled 2015-06-02: qty 5

## 2015-06-02 MED ORDER — FUROSEMIDE 10 MG/ML IJ SOLN
20.0000 mg | Freq: Once | INTRAMUSCULAR | Status: AC
  Administered 2015-06-02: 20 mg via INTRAVENOUS
  Filled 2015-06-02: qty 2

## 2015-06-02 MED ORDER — FERROUS SULFATE 300 (60 FE) MG/5ML PO SYRP
300.0000 mg | ORAL_SOLUTION | Freq: Every day | ORAL | Status: DC
  Filled 2015-06-02 (×2): qty 5

## 2015-06-02 MED ORDER — RESOURCE THICKENUP CLEAR PO POWD
ORAL | Status: DC | PRN
  Filled 2015-06-02: qty 125

## 2015-06-02 NOTE — Progress Notes (Signed)
   Subjective:  Patient reports pain as mild to moderate.  No c/o. MBBS completed. Receiving 1 unit PRBCs.  Objective:   VITALS:   Filed Vitals:   06/02/15 0455 06/02/15 1321 06/02/15 1523 06/02/15 1550  BP: 146/61 144/37 140/41 140/43  Pulse: 60 60 72 69  Temp:  97.6 F (36.4 C) 98.5 F (36.9 C) 98.5 F (36.9 C)  TempSrc: Axillary Oral Oral Oral  Resp: 14 12 13 12   Height:      Weight:      SpO2: 97% 100% 97% 96%    ABD soft Sensation intact distally Intact pulses distally Dorsiflexion/Plantar flexion intact Incision: dressing C/D/I Compartment soft   Lab Results  Component Value Date   WBC 11.5* 06/02/2015   HGB 7.4* 06/02/2015   HCT 24.2* 06/02/2015   MCV 95.3 06/02/2015   PLT 177 06/02/2015   BMET    Component Value Date/Time   NA 143 06/02/2015 0443   K 4.1 06/02/2015 0443   CL 112* 06/02/2015 0443   CO2 24 06/02/2015 0443   GLUCOSE 94 06/02/2015 0443   BUN 31* 06/02/2015 0443   CREATININE 0.95 06/02/2015 0443   CREATININE 1.07 01/06/2014 1100   CALCIUM 8.7* 06/02/2015 0443   GFRNONAA 53* 06/02/2015 0443   GFRNONAA 47* 01/06/2014 1100   GFRAA >60 06/02/2015 0443   GFRAA 55* 01/06/2014 1100     Assessment/Plan: 2 Days Post-Op   Principal Problem:   Intertrochanteric fracture of left hip (HCC) Active Problems:   Essential hypertension   Hyperlipidemia   DM (diabetes mellitus) (HCC)   Pacemaker-St.Jude   Anemia   Hip fracture (HCC)   Fracture, intertrochanteric, left femur ([place])    WBAT with walker DVT ppx: ASA 325mg  PO BID, TEDs, SCDs PO pain control: avoid narcotics UTI per hospitalists PT/OT Dispo: d/c planning    Wade Asebedo, Horald Pollen 06/02/2015, 4:27 PM   Rod Can, MD Cell [phone]

## 2015-06-02 NOTE — Progress Notes (Signed)
Briana Staggers, MD Physician Signed Physical Medicine and Rehabilitation Consult Note 05/17/2015 1:24 PM  Related encounter: ED to Hosp-Admission (Discharged) from 05/16/2015 in [place] All Collapse All        Physical Medicine and Rehabilitation Consult Reason for Consult: Acute Left pontine infarct Referring Physician: [facility]   HPI: Briana Thornton is a 82 y.o. right handed female with history of hypertension, diabetes mellitus with peripheral neuropathy,CHB with pacemaker, dementia maintained on Aricept. Patient lives with daughter who is a Marine scientist at [employer] and son-in-law. Patient used a walker prior to admission. Family assistance is needed. Recently discharged from [facility] 3 weeks ago for pneumonia. Presented 05/16/2015 with right sided weakness 1 week. Cranial CT scan shows early left pontine infarct as well as remote basal ganglia infarcts bilaterally, remote left corona radiata infarct.Marland Kitchen MRI not completed due to pacemaker. Patient did not receive TPA. Echocardiogram CT angiogram of head and neck are all pending. Presently maintained on aspirin for CVA prophylaxis. Tolerating a regular consistency diet. Physical therapy evaluation completed 05/17/2015 with recommendations of physical medicine rehabilitation consult.   Review of Systems  Constitutional: Negative for fever and chills.  HENT: Negative for hearing loss.  Eyes:   Decreased vision right eye  Respiratory: Negative for cough and shortness of breath.  Cardiovascular: Positive for palpitations. Negative for chest pain and leg swelling.  Gastrointestinal: Positive for constipation.  Genitourinary: Negative for dysuria and flank pain.  Musculoskeletal: Positive for myalgias and joint pain.  Skin: Negative for rash.  Neurological: Positive for weakness. Negative for headaches.  Psychiatric/Behavioral: Positive for  depression and memory loss.  All other systems reviewed and are negative.  Past Medical History  Diagnosis Date  . Chicken pox as a child  . Measles as a child  . IBS (irritable bowel syndrome)   . Diabetes mellitus 47    type 2  . Hyperlipidemia 72  . Hypertension 72  . Vision loss of right eye   . Vertigo 2010    benign  . Calcification of cartilage     ear  . Cardiovascular system disease 07/02/2011  . Hemorrhoid 07/02/2011  . Osteoporosis 07/02/2011  . Weight loss 07/31/2011  . Anemia 05/22/2012  . Broken wrist 10-22-11    left  . Depression 05/22/2012  . Dermatitis 05/22/2012    Right neck   Past Surgical History  Procedure Laterality Date  . Hemorroidectomy  1979  . Knee scoped  2002    right  . Rotator cuff repair  2004    right  . Abdominal hysterectomy  2008    partial still has ovaries  . Lens implant left  2006  . Pacemaker insertion  2010  . Wrist surgery  11-05-11    left wrist  . Intramedullary (im) nail intertrochanteric Right 07/24/2013    Procedure: INTRAMEDULLARY (IM) NAIL INTERTROCHANTRIC; Surgeon: Gearlean Alf, MD; Location: [facility]; Service: Orthopedics; Laterality: Right;   Family History  Problem Relation Age of Onset  . Diabetes Mother     type 2  . Hypertension Mother   . Cancer Brother     lung-smoker  . Diabetes Daughter     pre diabetic  . Diabetes Son     type 2  . Cancer Maternal Grandfather     prostate   Social History:  reports that she has never smoked. She has never used smokeless tobacco. She reports that she  does not drink alcohol or use illicit drugs. Allergies: No Known Allergies Medications Prior to Admission  Medication Sig Dispense Refill  . acetaminophen (TYLENOL) 325 MG tablet Take 2 tablets (650 mg total) by mouth every 6 (six) hours as needed for  mild pain (or Fever >/= 101). 60 tablet 0  . alum & mag hydroxide-simeth (MAALOX/MYLANTA) 200-200-20 MG/5ML suspension Take 30 mLs by mouth every 6 (six) hours as needed for indigestion or heartburn (dyspepsia). 355 mL 0  . atorvastatin (LIPITOR) 10 MG tablet TAKE 1 TABLET (10 MG TOTAL) BY MOUTH DAILY. 90 tablet 3  . bisacodyl (DULCOLAX) 10 MG suppository Place 1 suppository (10 mg total) rectally daily as needed for moderate constipation. 12 suppository 0  . docusate sodium (COLACE) 100 MG capsule Take 100 mg by mouth every other day.     . donepezil (ARICEPT) 10 MG tablet Take 1 tablet (10 mg total) by mouth at bedtime. (Patient taking differently: Take 10 mg by mouth daily. ) 90 tablet 3  . metFORMIN (GLUCOPHAGE-XR) 500 MG 24 hr tablet TAKE 1 TABLET (500 MG TOTAL) BY MOUTH DAILY WITH BREAKFAST. 90 tablet 3  . methylcellulose (ARTIFICIAL TEARS) 1 % ophthalmic solution Place 1 drop into both eyes as needed (dry eyes).     . Multiple Vitamin (MULTIVITAMIN WITH MINERALS) TABS tablet Take 2 tablets by mouth daily.     . polyethylene glycol (MIRALAX / GLYCOLAX) packet Take 17 g by mouth daily as needed for mild constipation. 14 each 0  . Probiotic Product (PROBIOTIC DAILY PO) Take 1 tablet by mouth daily.    . Ipratropium-Albuterol (COMBIVENT RESPIMAT) 20-100 MCG/ACT AERS respimat Inhale 1 puff into the lungs every 6 (six) hours as needed for wheezing or shortness of breath. (Patient not taking: Reported on 04/26/2015)      Home: Home Living Family/patient expects to be discharged to:: Private residence Living Arrangements: Children Available Help at Discharge: Family Type of Home: House Home Access: [place]: [place]: Environmental consultant - 2 wheels, Wheelchair - manual Additional Comments: resides at daughter's home Lives With: Family  Functional History: Prior Function Level of Independence: Needs  assistance Gait / Transfers Assistance Needed: left SNF weeks ago walking modified independent with RW; over past week progressivley weaker to point of non-ambulatory and using w/c Functional Status:  Mobility: Bed Mobility General bed mobility comments: up in chair Transfers Overall transfer level: Needs assistance Equipment used: Rolling walker (2 wheeled) Transfers: Sit to/from Stand Sit to Stand: Max assist General transfer comment: due to posterior lean (partially due to tight heel cords); x 2 Ambulation/Gait Ambulation/Gait assistance: Mod assist Ambulation Distance (Feet): 10 Feet (toileted, 5) Assistive device: Rolling walker (2 wheeled) Gait Pattern/deviations: Step-to pattern, Decreased stride length, Decreased dorsiflexion - right, Decreased dorsiflexion - left, Leaning posteriorly, Shuffle, Trunk flexed, Narrow base of support General Gait Details: loses balance posteriorly; requires assist to maneuver RW in all turns; increased difficulty advancing LLE (especially when turning or stepping backwards) Gait velocity interpretation: <1.8 ft/sec, indicative of risk for recurrent falls    ADL:    Cognition: Cognition Overall Cognitive Status: No family/caregiver present to determine baseline cognitive functioning Arousal/Alertness: Awake/alert Orientation Level: Oriented to person, Disoriented to place, Disoriented to time, Disoriented to situation Attention: Focused Focused Attention: Appears intact Memory: Impaired Memory Impairment: Storage deficit, Retrieval deficit Awareness: Impaired Awareness Impairment: Intellectual impairment Problem Solving: Impaired Problem Solving Impairment: Verbal basic Safety/Judgment: Impaired Cognition Arousal/Alertness: Awake/alert Behavior During Therapy: WFL for tasks assessed/performed Overall  Cognitive Status: No family/caregiver present to determine baseline cognitive functioning  Blood pressure 142/46, pulse 60, temperature  97.7 F (36.5 C), temperature source Oral, resp. rate 20, height 5\' 2"  (1.575 m), weight 52.3 kg (115 lb 4.8 oz), SpO2 100 %. Physical Exam  Constitutional:  Frail appearing  HENT:  Head: Normocephalic.  Eyes: Pupils are equal, round, and reactive to light.  Neck: Normal range of motion. Neck supple. No thyromegaly present.  Cardiovascular: Normal rate and regular rhythm.  Respiratory: Effort normal and breath sounds normal. No respiratory distress.  GI: Soft. Bowel sounds are normal. She exhibits no distension.  Neurological: She is alert.  Makes good eye contact with examiner.Follows commands.Provides age but needs cues for DOB.Limited medical historian. RUE 2+ to 3/5. RLE 3-/5. LUE and LEL 3+ to 4/5. Mild right central 7 and tongue deviation. Inconsistent memory and attention  Skin: Skin is warm and dry.  Psychiatric:  Pleasant, cooperative     Lab Results Last 24 Hours    Results for orders placed or performed during the hospital encounter of 05/16/15 (from the past 24 hour(s))  CBC Status: None   Collection Time: 05/16/15 2:48 PM  Result Value Ref Range   WBC 8.0 4.0 - 10.5 K/uL   RBC 4.26 3.87 - 5.11 MIL/uL   Hemoglobin 12.1 12.0 - 15.0 g/dL   HCT 39.6 36.0 - 46.0 %   MCV 93.0 78.0 - 100.0 fL   MCH 28.4 26.0 - 34.0 pg   MCHC 30.6 30.0 - 36.0 g/dL   RDW 14.4 11.5 - 15.5 %   Platelets 248 150 - 400 K/uL  Comprehensive metabolic panel Status: Abnormal   Collection Time: 05/16/15 2:48 PM  Result Value Ref Range   Sodium 144 135 - 145 mmol/L   Potassium 4.0 3.5 - 5.1 mmol/L   Chloride 107 101 - 111 mmol/L   CO2 30 22 - 32 mmol/L   Glucose, Bld 96 65 - 99 mg/dL   BUN 23 (H) 6 - 20 mg/dL   Creatinine, Ser 0.91 0.44 - 1.00 mg/dL   Calcium 9.2 8.9 - 10.3 mg/dL   Total Protein 6.6 6.5 - 8.1 g/dL   Albumin 3.7 3.5 - 5.0 g/dL   AST 18 15 - 41 U/L   ALT 10 (L) 14 - 54  U/L   Alkaline Phosphatase 74 38 - 126 U/L   Total Bilirubin 0.8 0.3 - 1.2 mg/dL   GFR calc non Af Amer 56 (L) >60 mL/min   GFR calc Af Amer >60 >60 mL/min   Anion gap 7 5 - 15  I-stat troponin, ED Status: None   Collection Time: 05/16/15 3:28 PM  Result Value Ref Range   Troponin i, poc 0.01 0.00 - 0.08 ng/mL   Comment 3     Urinalysis, Routine w reflex microscopic (not at [facility]) Status: None   Collection Time: 05/16/15 4:06 PM  Result Value Ref Range   Color, Urine YELLOW YELLOW   APPearance CLEAR CLEAR   Specific Gravity, Urine 1.014 1.005 - 1.030   pH 7.5 5.0 - 8.0   Glucose, UA NEGATIVE NEGATIVE mg/dL   Hgb urine dipstick NEGATIVE NEGATIVE   Bilirubin Urine NEGATIVE NEGATIVE   Ketones, ur NEGATIVE NEGATIVE mg/dL   Protein, ur NEGATIVE NEGATIVE mg/dL   Nitrite NEGATIVE NEGATIVE   Leukocytes, UA NEGATIVE NEGATIVE  Glucose, capillary Status: None   Collection Time: 05/16/15 9:51 PM  Result Value Ref Range   Glucose-Capillary 90 65 - 99 mg/dL  Comment 1 Notify RN    Comment 2 Document in Chart   Comprehensive metabolic panel Status: Abnormal   Collection Time: 05/16/15 10:10 PM  Result Value Ref Range   Sodium 144 135 - 145 mmol/L   Potassium 3.8 3.5 - 5.1 mmol/L   Chloride 110 101 - 111 mmol/L   CO2 26 22 - 32 mmol/L   Glucose, Bld 116 (H) 65 - 99 mg/dL   BUN 19 6 - 20 mg/dL   Creatinine, Ser 0.90 0.44 - 1.00 mg/dL   Calcium 9.1 8.9 - 10.3 mg/dL   Total Protein 5.9 (L) 6.5 - 8.1 g/dL   Albumin 3.2 (L) 3.5 - 5.0 g/dL   AST 17 15 - 41 U/L   ALT 10 (L) 14 - 54 U/L   Alkaline Phosphatase 71 38 - 126 U/L   Total Bilirubin 0.9 0.3 - 1.2 mg/dL   GFR calc non Af Amer 56 (L) >60 mL/min   GFR calc Af Amer >60 >60 mL/min   Anion gap 8 5 - 15  CBC Status: Abnormal    Collection Time: 05/16/15 10:10 PM  Result Value Ref Range   WBC 8.5 4.0 - 10.5 K/uL   RBC 4.06 3.87 - 5.11 MIL/uL   Hemoglobin 11.9 (L) 12.0 - 15.0 g/dL   HCT 37.7 36.0 - 46.0 %   MCV 92.9 78.0 - 100.0 fL   MCH 29.3 26.0 - 34.0 pg   MCHC 31.6 30.0 - 36.0 g/dL   RDW 14.4 11.5 - 15.5 %   Platelets 219 150 - 400 K/uL  Glucose, capillary Status: None   Collection Time: 05/17/15 6:17 AM  Result Value Ref Range   Glucose-Capillary 74 65 - 99 mg/dL  Lipid panel Status: None   Collection Time: 05/17/15 8:22 AM  Result Value Ref Range   Cholesterol 128 0 - 200 mg/dL   Triglycerides 61 <150 mg/dL   HDL 65 >40 mg/dL   Total CHOL/HDL Ratio 2.0 RATIO   VLDL 12 0 - 40 mg/dL   LDL Cholesterol 51 0 - 99 mg/dL  Glucose, capillary Status: Abnormal   Collection Time: 05/17/15 11:13 AM  Result Value Ref Range   Glucose-Capillary 111 (H) 65 - 99 mg/dL   Comment 1 Notify RN    Comment 2 Document in Chart       Imaging Results (Last 48 hours)    Ct Head Wo Contrast  05/16/2015 CLINICAL DATA: 82 year old diabetic hypertensive female with generalize weakness. Initial encounter. EXAM: CT HEAD WITHOUT CONTRAST TECHNIQUE: Contiguous axial images were obtained from the base of the skull through the vertex without intravenous contrast. COMPARISON: 03/05/2015. FINDINGS: No intracranial hemorrhage. New from the prior examination is linear low density superior left paracentral pontine region. Question small acute infarct versus result of streak artifact. Remote basal ganglia infarcts bilaterally. Remote left corona radiata infarct. Prominent small vessel disease type changes. Global atrophy without hydrocephalus. Anterior frontal 1.9 cm calcified mass consistent with meningioma unchanged. No significant surrounding vasogenic edema. Mastoid air cells, middle ear cavities and visualized paranasal  sinuses are clear. Post left lens replacement otherwise orbital structures unremarkable. IMPRESSION: No intracranial hemorrhage. New from the prior examination is linear low density superior left paracentral pontine region. Question small acute infarct versus result of streak artifact. Remote basal ganglia infarcts bilaterally. Remote left corona radiata infarct. Prominent small vessel disease type changes. Global atrophy without hydrocephalus. Anterior frontal 1.9 cm calcified mass consistent with meningioma unchanged. No significant surrounding vasogenic edema. Electronically Signed By:  Genia Del M.D. On: 05/16/2015 16:28   Dg Chest Portable 1 View  05/16/2015 CLINICAL DATA: 82 year old female with generalized weakness and decreased appetite EXAM: PORTABLE CHEST 1 VIEW COMPARISON: Prior chest x-ray 04/26/2015 FINDINGS: Stable borderline cardiomegaly with left heart enlargement. Atherosclerotic calcifications again noted in the transverse aorta. Unchanged position of left subclavian approach cardiac rhythm maintenance device with leads overlying the right atrium and right ventricle. Calcified granuloma in the left lower lobe remains unchanged. Dense calcification is also present surrounding the mitral valve annulus. The lungs are clear. No focal airspace consolidation, pleural effusion, pneumothorax or evidence of pulmonary edema. No suspicious pulmonary mass or nodule. Stable mild central bronchitic changes and interstitial prominence. No acute osseous abnormality. IMPRESSION: Stable chest x-ray without evidence of acute cardiopulmonary process. Electronically Signed By: Jacqulynn Cadet M.D. On: 05/16/2015 15:15     Assessment/Plan: Diagnosis: left pontine infarct with right hemiparesis 1. Does the need for close, 24 hr/day medical supervision in concert with the patient's rehab needs make it unreasonable for this patient to be served in a less intensive setting? Yes 2. Co-Morbidities  requiring supervision/potential complications: dm2, htn, bppv, anemia 3. Due to bladder management, bowel management, safety, skin/wound care, disease management, medication administration, pain management and patient education, does the patient require 24 hr/day rehab nursing? Yes 4. Does the patient require coordinated care of a physician, rehab nurse, PT (1-2 hrs/day, 5 days/week), OT (1-2 hrs/day, 5 days/week) and SLP (1-2 hrs/day, 5 days/week) to address physical and functional deficits in the context of the above medical diagnosis(es)? Yes Addressing deficits in the following areas: balance, endurance, locomotion, strength, transferring, bowel/bladder control, bathing, dressing, feeding, grooming, toileting, cognition, speech and psychosocial support 5. Can the patient actively participate in an intensive therapy program of at least 3 hrs of therapy per day at least 5 days per week? Yes 6. The potential for patient to make measurable gains while on inpatient rehab is good 7. Anticipated functional outcomes upon discharge from inpatient rehab are supervision and min assist with PT, supervision and min assist with OT, supervision and min assist with SLP. 8. Estimated rehab length of stay to reach the above functional goals is: 13-17 days 9. Does the patient have adequate social supports and living environment to accommodate these discharge functional goals? Yes and Potentially 10. Anticipated D/C setting: Home 11. Anticipated post D/C treatments: [place] therapy 12. Overall Rehab/Functional Prognosis: good  RECOMMENDATIONS: This patient's condition is appropriate for continued rehabilitative care in the following setting: potentially CIR Patient has agreed to participate in recommended program. Potentially Note that insurance prior authorization may be required for reimbursement for recommended care.  Comment: Need to establish caregivers/expected dc goals with family. Rehab Admissions Coordinator  to follow up.  Thanks,  Briana Staggers, MD, [facility]     05/17/2015       Revision History     Date/Time User Provider Type Action   05/17/2015 3:27 PM Briana Staggers, MD Physician Sign   05/17/2015 1:48 PM Cathlyn Parsons, PA-C Physician Assistant Pend   View Details Report       Routing History     Date/Time From To Method   05/17/2015 3:27 PM Briana Staggers, MD Briana Staggers, MD In Basket   05/17/2015 3:27 PM Briana Staggers, MD Mosie Lukes, MD In [place]

## 2015-06-02 NOTE — Progress Notes (Signed)
Speech Pathology: MBS completed and posted under imaging.  Pt with a moderate dysphagia and aspiration of thin liquids.  Recommend dysphagia 3, nectar liquids, meds crushed in puree, full assist with meals.  Zandon Talton L. Tivis Ringer, [place] CCC/SLP Pager [phone]

## 2015-06-02 NOTE — Progress Notes (Signed)
PT Cancellation Note  Patient Details Name: Briana Thornton MRN: [id number] DOB: February 21, 1927   Cancelled Treatment:     PT deferred this date, pt with Hgb @7 .4 with transfusion scheduled and MBS scheduled for this date as well.  Will follow.   Timisha Mondry 06/02/2015, 4:44 PM

## 2015-06-02 NOTE — Progress Notes (Signed)
PROGRESS NOTE  Briana Thornton [id number] DOB: Jun 29, 1926 DOA: 05/30/2015 PCP: Penni Homans, MD  Summary: 71yow presented after fall; found to have left hip fx. Underwent successful operation 1/3. Course with some complications secondary to ABLA and dysphagia. Will need SNF for rehab   Assessment/Plan: 1. Left hip fx. S/p operative repair 1/3. Management per orthopedics. 2. ABLA, perioperative. No obvious bleeding. Continue drifting down; reports feeling tire and has SEM appreciated on exam. Will transfuse 1 unit and follow Hgb trend. Continue iron supplementation.  3. DM type 2. Last hemoglobin A1c 5.8 in 04/2015 4. Possible UTI. Culture pending. Continue empiric treatment with ceftin  5. S/p TIA 04/2015, s/p CIR. No focal neurologic deficit 6. PMH pacemaker; HR stable 7. Stage 2 PrI on sacrum (POA), continue preventive measures  8. Dysphagia: appears to be oral-pharyngeal in nature. Will follow SPL rec's   Appears stable. Continue management per orthopedics as below.   FOr her ABLA will transfuse 1 unit of PRBC's and will continue iron supplementation BID.  Follow Hgb trend  SPL to perform MBS and help [place] with decisions of best diet consistency to mitigate risk (which are very high) for aspiration.  Per ortho: WBAT with walker; PT following  Per ortho: DVT ppx: ASA 325mg  PO BID, TEDs, SCDs  Code Status: DNR DVT prophylaxis: as per ortho above (ASA, SCD's and TED's) Family Communication: Discussed with daughter over the phone, She is a Therapist, sports at [employer] Disposition Plan: likely will need SNF for rehabilitation  Barton Dubois, MD  [facility]  Pager [phone] If 7PM-7AM, please contact night-coverage at [email or web address], password [facility]  06/02/2015, 10:30 AM  LOS: 2 days   Consultants:  Orthopedics  Procedures:  1/3 INTRAMEDULLARY (IM) NAIL INTERTROCHANTRIC (Left)  Antibiotics:  Ceftriaxone 1/3 >>  HPI/Subjective: Feels okay overall. Slightly concern/frustrated with  difficulty swallowing. No CP, no fever, no nausea or vomiting. Pain in her Left hip well controlled.  Objective: Filed Vitals:   06/01/15 0915 06/01/15 1409 06/01/15 2100 06/02/15 0455  BP: 133/51 118/36 117/35 146/61  Pulse:  62 61 60  Temp:  97.9 F (36.6 C) 96.8 F (36 C)   TempSrc:  Oral Axillary Axillary  Resp:  14 14 14   Height:      Weight:      SpO2:  96% 97% 97%    Intake/Output Summary (Last 24 hours) at 06/02/15 1030 Last data filed at 06/02/15 0608  Gross per 24 hour  Intake   1240 ml  Output    425 ml  Net    815 ml     Filed Weights   05/31/15 0300 05/31/15 1833  Weight: 48.081 kg (106 lb) 48 kg (105 lb 13.1 oz)    Exam:    General:  Appears calm and comfortable lying in bed; reports pain is well control. Have concerns about issues with her swallowing. Cardiovascular: RRR, soft SEM, no rubs or gallops. No LE edema. Respiratory: good air movement, scattered rhonchi, no crackles, no wheezing. Normal respiratory effort. Musculoskeletal: grossly normal tone BUE/BLE Psychiatric: grossly normal mood and affect, speech fluent and appropriate  New data reviewed:  BMP unremarkable  HGb 11.6 >> 8.3>>7.4  U/A equivocal  Pending data:  Urine culture  Scheduled Meds: . sodium chloride   Intravenous Once  . acidophilus  1 capsule Oral Daily  . aspirin EC  325 mg Oral BID PC  . atorvastatin  10 mg Oral q1800  . cefUROXime  500 mg Oral BID WC  . donepezil  10 mg Oral QHS  . feeding supplement (ENSURE ENLIVE)  237 mL Oral Daily  . ferrous sulfate  300 mg Oral BID WC  . furosemide  20 mg Intravenous Once  . lisinopril  5 mg Oral Daily   Continuous Infusions: . sodium chloride 75 mL/hr at 06/02/15 0608    Principal Problem:   Intertrochanteric fracture of left hip (HCC) Active Problems:   Essential hypertension   Hyperlipidemia   DM (diabetes mellitus) (HCC)   Pacemaker-[employer]   Anemia   Hip fracture (HCC)   Fracture, intertrochanteric, left  femur (HCC)   Time spent 25 minutes

## 2015-06-03 DIAGNOSIS — N390 Urinary tract infection, site not specified: Secondary | ICD-10-CM

## 2015-06-03 DIAGNOSIS — B952 Enterococcus as the cause of diseases classified elsewhere: Secondary | ICD-10-CM

## 2015-06-03 LAB — URINE CULTURE: Culture: 40000

## 2015-06-03 LAB — CBC
HEMATOCRIT: 26.8 % — AB (ref 36.0–46.0)
HEMOGLOBIN: 8.5 g/dL — AB (ref 12.0–15.0)
MCH: 29.2 pg (ref 26.0–34.0)
MCHC: 31.7 g/dL (ref 30.0–36.0)
MCV: 92.1 fL (ref 78.0–100.0)
Platelets: 197 10*3/uL (ref 150–400)
RBC: 2.91 MIL/uL — ABNORMAL LOW (ref 3.87–5.11)
RDW: 14.9 % (ref 11.5–15.5)
WBC: 8.4 10*3/uL (ref 4.0–10.5)

## 2015-06-03 LAB — TYPE AND SCREEN
ABO/RH(D): B POS
Antibody Screen: NEGATIVE
Unit division: 0

## 2015-06-03 MED ORDER — AMPICILLIN 250 MG PO CAPS: 250.0000 mg | ORAL_CAPSULE | Freq: Four times a day (QID) | ORAL | Status: AC

## 2015-06-03 MED ORDER — RESOURCE THICKENUP CLEAR PO POWD: ORAL | Status: DC

## 2015-06-03 MED ORDER — AMPICILLIN 250 MG PO CAPS
250.0000 mg | ORAL_CAPSULE | Freq: Four times a day (QID) | ORAL | Status: DC
  Administered 2015-06-03 (×2): 250 mg via ORAL
  Filled 2015-06-03 (×4): qty 1

## 2015-06-03 NOTE — Clinical Social Work Placement (Signed)
   CLINICAL SOCIAL WORK PLACEMENT  NOTE  Date:  06/03/2015  Patient Details  Name: Briana Thornton MRN: [id number] Date of Birth: 28-Sep-1926  Clinical Social Work is seeking post-discharge placement for this patient at the [place] level of care (*CSW will initial, date and re-position this form in  chart as items are completed):  No   Patient/family provided with [place] Work Department's list of facilities offering this level of care within the geographic area requested by the patient (or if unable, by the patient's family).  Yes   Patient/family informed of their freedom to choose among providers that offer the needed level of care, that participate in Medicare, Medicaid or managed care program needed by the patient, have an available bed and are willing to accept the patient.  Yes   Patient/family informed of [place]'s ownership interest in [facility] and [facility], as well as of the fact that they are under no obligation to receive care at these facilities.  PASRR submitted to EDS on       PASRR number received on       Existing PASRR number confirmed on 05/31/15     FL2 transmitted to all facilities in geographic area requested by pt/family on 05/31/15     FL2 transmitted to all facilities within larger geographic area on       Patient informed that his/her managed care company has contracts with or will negotiate with certain facilities, including the following:        Yes   Patient/family informed of bed offers received.  Patient chooses bed at [facility]     Physician recommends and patient chooses bed at [facility]    Patient to be transferred to [facility] on 06/03/15.  Patient to be transferred to facility by [facility]     Patient family notified on 06/03/15 of transfer.  Name of family member notified:  DAUGHTER     PHYSICIAN       Additional Comment; Pt / daughter are in agreement with d/c to [facility]  today. [facility] transport is needed. Daughter is aware out of pocket costs may be associated with [facility] transport. NSG reviewed d/c summary, scripts, avs. Scripts included in d/c packet. D/C summary sent to SNF for review prior to d/c.   _______________________________________________ Luretha Rued, [place] 06/03/2015, 1:29 PM

## 2015-06-03 NOTE — Consult Note (Signed)
   [facility] CM Inpatient Consult   06/03/2015  Briana Thornton 1927/04/20 [id number]    Came back to speak with daughter, [name], who states patient is going to [facility] [name] states patient does not need [facility] Care Management follow up at this time. Appreciative of offer. [facility] Care Management brochure and contact information to call in future if needed.   Marthenia Rolling, MSN-Ed, RN,BSN [facility] Liaison [phone]

## 2015-06-03 NOTE — Care Management Important Message (Signed)
Important Message  Patient Details  Name: Briana Thornton MRN: [id number] Date of Birth: Jun 13, 1926   Medicare Important Message Given:  Yes    [name] 06/03/2015, 12:00 [place] Message  Patient Details  Name: Briana Thornton MRN: [id number] Date of Birth: [birth year]   Medicare Important Message Given:  Yes    [name] 06/03/2015, 12:00 PM

## 2015-06-03 NOTE — Progress Notes (Signed)
Physical Therapy Treatment Patient Details Name: Briana Thornton MRN: [id number] DOB: September 02, 1926 Today's Date: 06/03/2015    History of Present Illness Briana Thornton is a 82 y.o. right handed female with history of hypertension, diabetes mellitus with peripheral neuropathy,CHB with pacemaker, dementia maintained on Aricept. Pt presented after fall; found to have left hip fx. Underwent INTRAMEDULLARY (IM) NAIL INTERTROCHANTRIC. Pt is not WBAT with RW.     PT Comments    Pt cooperative but progressing slowly 2* fatigue/ltd endurance.  Follow Up Recommendations  SNF     Equipment Recommendations  None recommended by PT    Recommendations for Other Services OT consult     Precautions / Restrictions Precautions Precautions: Fall Restrictions Weight Bearing Restrictions: No LLE Weight Bearing: Weight bearing as tolerated    Mobility  Bed Mobility Overal bed mobility: Needs Assistance Bed Mobility: Supine to Sit;Sit to Supine     Supine to sit: Max assist;+2 for physical assistance;HOB elevated Sit to supine: Max assist;+2 for physical assistance   General bed mobility comments: Pt following ltd cues for self assistance.  Pt assisted to from EOB with pad and max assist.  Pt able to balance at EOB x 8 min with min guard/sup and cues for wt shifts to correct L lean  Transfers Overall transfer level: Needs assistance Equipment used: Rolling walker (2 wheeled) Transfers: Sit to/from Stand Sit to Stand: Mod assist;+2 physical assistance;+2 safety/equipment         General transfer comment: Multimodal cueing for hand placement, sequencing, technique for sit to/from stand from EOB. Pt stood x 2 for total ~90 seconds with RW  and assist of 2 for support and to bring wt fwd over feet  Ambulation/Gait             General Gait Details: Pt stood at bedside only - ltd by pt fatigue   Stairs            Wheelchair Mobility    Modified Rankin (Stroke Patients Only)        Balance                                    Cognition Arousal/Alertness: Awake/alert Behavior During Therapy: WFL for tasks assessed/performed;Flat affect Overall Cognitive Status: History of cognitive impairments - at baseline                      Exercises General Exercises - Lower Extremity Ankle Circles/Pumps: AAROM;Both;10 reps;Supine Heel Slides: AAROM;Both;Supine;15 reps Hip ABduction/ADduction: AAROM;Both;Supine;15 reps    General Comments        Pertinent Vitals/Pain Pain Assessment: Faces Faces Pain Scale: Hurts a little bit Pain Intervention(s): Limited activity within patient's tolerance;Monitored during session;Premedicated before session    Home Living                      Prior Function            PT Goals (current goals can now be found in the care plan section) Acute Rehab PT Goals Patient Stated Goal: none stated PT Goal Formulation: Patient unable to participate in goal setting Time For Goal Achievement: 05/31/15 Potential to Achieve Goals: Good Progress towards PT goals: Progressing toward goals    Frequency  Min 3X/week    PT Plan Current plan remains appropriate    Co-evaluation  End of Session Equipment Utilized During Treatment: Gait belt Activity Tolerance: Patient tolerated treatment well;Patient limited by fatigue Patient left: in bed;with call bell/phone within reach;with bed alarm set     Time: 1120-1158 PT Time Calculation (min) (ACUTE ONLY): 38 min  Charges:  $Therapeutic Exercise: 8-22 mins $Therapeutic Activity: 23-37 mins                    G Codes:      Shital Crayton Jun 07, 2015, 12:40 PM

## 2015-06-03 NOTE — Discharge Summary (Signed)
Physician Discharge Summary  Karmann Blaustein [id number] DOB: 1926-07-20 DOA: 05/30/2015  PCP: Penni Homans, MD  Admit date: 05/30/2015 Discharge date: 06/03/2015   Recommendations for Outpatient Follow-Up:   1. The patient is being discharged to [facility] for rehab.   Discharge Diagnosis:   Principal Problem:    Intertrochanteric fracture of left hip (HCC) Active Problems:    Essential hypertension    Hyperlipidemia    DM (diabetes mellitus) (HCC)    Pacemaker-St.Jude    Acute blood loss anemia    Enterococcus UTI    Dysphagia    Stage II decubitus ulcer    Dementia   Discharge disposition:  SNF: [facility].  Discharge Condition: Improved.  Diet recommendation: Dysphagia 3, nectar liquids, meds crushed in puree, full assist with meals.   Wound care: Dry dressing changes daily as needed.  May shower in 4 days.   History of Present Illness:   Stacy Atayde is an 82 y.o. female with a PMH of hypertension, hyperlipidemia, and diabetes who was admitted 05/30/15 after a fall resulting in a left hip fracture. Status post operative repair 05/30/14. Postoperative course complicated by acute blood loss anemia and dysphagia.  Hospital Course by Problem:   Principal Problem:  Intertrochanteric fracture of left hip (HCC) - Status post intramedullary fixation of left intertrochanteric femur fracture 05/31/15. - SNF placement for rehabilitation.  Active Problems:  Stage II sacral decubitus ulcer - Continue pressure reduction efforts.   Enterococcal UTI - Currently on Ceftin. Switch to ampicillin.   Dementia - Continue Aricept.    Dysphasia - Being followed by speech therapy. - Modified barium swallow when safe. - Dysphagia 3 diet, medications crushed with pure. - Strict aspiration precautions.   Essential hypertension - Continue lisinopril.   Hyperlipidemia - Continue Lipitor.   DM (diabetes mellitus) ([place]) - Not on therapy. CBG 74-104.    Pacemaker-St.Jude   Acute blood loss anemia - Status post 1 unit of PRBCs 06/02/15.   DVT Prophylaxis - Aspirin, TED hoses and SCDs.    Medical Consultants:    Orthopedics: Rod Can, MD   Discharge Exam:   Filed Vitals:   06/03/15 0639 06/03/15 0909  BP: 168/58 150/52  Pulse:    Temp:    Resp:     Filed Vitals:   06/02/15 2235 06/03/15 0439 06/03/15 0639 06/03/15 0909  BP: 151/49 175/49 168/58 150/52  Pulse: 66 60    Temp: 98.6 F (37 C) 98.5 F (36.9 C)    TempSrc: Oral Oral    Resp: 14 14    Height:      Weight:      SpO2: 97% 93%      Gen:  NAD Cardiovascular:  RRR, No M/R/G Respiratory: Lungs CTAB Gastrointestinal: Abdomen soft, NT/ND with normal active bowel sounds. Extremities: No C/E/C   The results of significant diagnostics from this hospitalization (including imaging, microbiology, ancillary and laboratory) are listed below for reference.     Procedures and Diagnostic Studies:   Dg Chest 1 View  05/31/2015  CLINICAL DATA:  Preoperative chest radiograph for left femoral fracture. Initial encounter. EXAM: CHEST 1 VIEW COMPARISON:  Chest radiograph performed 05/16/2015 FINDINGS: The lungs are mildly hypoexpanded. No focal consolidation, pleural effusion or pneumothorax is seen. The cardiomediastinal silhouette is borderline enlarged. A pacemaker is noted overlying the left chest wall, with leads ending overlying the right atrium and right ventricle. Dense calcification is suggested at the mitral valve. No acute osseous abnormalities are identified. IMPRESSION: 1. Lungs  mildly hypoexpanded but grossly clear. 2. Borderline cardiomegaly. Dense calcification suggested at the mitral valve. Electronically Signed   By: Garald Balding M.D.   On: 05/31/2015 01:18   Ct Head Wo Contrast  05/31/2015  CLINICAL DATA:  Status post fall while going from bedroom to bathroom. Concern for head injury. Initial encounter. EXAM: CT HEAD WITHOUT CONTRAST TECHNIQUE:  Contiguous axial images were obtained from the base of the skull through the vertex without intravenous contrast. COMPARISON:  CT of the head performed 05/18/2015 FINDINGS: There is no evidence of acute infarction, or intra- or extra-axial hemorrhage on CT. Moderate cortical volume loss is noted, with prominence of the ventricles and sulci. Cerebellar atrophy is seen. Relatively diffuse periventricular and subcortical white matter change likely reflects small vessel ischemic microangiopathy. Chronic lacunar infarcts are seen at the basal ganglia bilaterally. A chronic lacunar infarct is also noted at the left side of the pons. A 2.0 cm densely calcified meningioma is again noted along the inferior aspect of the anterior falx cerebri, grossly stable in appearance. The brainstem and fourth ventricle are within normal limits. The basal ganglia are unremarkable in appearance. The cerebral hemispheres demonstrate grossly normal gray-white differentiation. No mass effect or midline shift is seen. There is no evidence of fracture; visualized osseous structures are unremarkable in appearance. The orbits are within normal limits. The paranasal sinuses and mastoid air cells are well-aerated. No significant soft tissue abnormalities are seen. IMPRESSION: 1. No acute intracranial pathology seen on CT. 2. Moderate cortical volume loss and diffuse small vessel ischemic microangiopathy. 3. Chronic lacunar infarcts at the basal ganglia bilaterally, and chronic lacunar infarct at the left side of the pons. 4. 2.0 cm densely calcified meningioma again noted along the inferior aspect of the anterior falx cerebri. Electronically Signed   By: Garald Balding M.D.   On: 05/31/2015 01:09   Pelvis Portable  05/31/2015  CLINICAL DATA:  82 year old female with a history of left hip open reduction internal fixation. EXAM: PORTABLE PELVIS 1-2 VIEWS COMPARISON:  05/31/2015 FINDINGS: Early postop changes of left femoral neck fixation with  antegrade intra medullary rod placement with left femoral neck gamma rod and single distal interlocking screw. Alignment relatively maintained at the fracture site at the left femoral neck. Subcutaneous gas within the proximal left thigh. Diffuse osteopenia, with postoperative changes of prior right femoral neck fixation again noted. Surgical catheter projects over the midline. Dense atherosclerotic calcifications of the femoral popliteal vasculature. IMPRESSION: Early postoperative changes of left femoral neck fixation, as above. Atherosclerosis. Signed, Dulcy Fanny. Earleen Newport, DO [facility] Electronically Signed   By: Corrie Mckusick D.O.   On: 05/31/2015 18:04   Dg C-arm 1-60 Min-no Report  05/31/2015  CLINICAL DATA: Surgery - Left  IM Nail C-ARM 1-60 MINUTES Fluoroscopy was utilized by the requesting physician.  No radiographic interpretation.   Dg Hip Operative Unilat With Pelvis Left  05/31/2015  CLINICAL DATA:  LEFT femur fracture EXAM: OPERATIVE LEFT HIP (WITH PELVIS IF PERFORMED) 2 new VIEWS TECHNIQUE: Fluoroscopic spot image(s) were submitted for interpretation post-operatively. COMPARISON:  Radiograph 05/30/2015. FINDINGS: Insert medullary nail fixation of LEFT intertrochanteric fracture. Compression screw and distal fixation screw noted. IMPRESSION: No complication following intra medullary nail fixation of LEFT femur fracture Electronically Signed   By: Suzy Bouchard M.D.   On: 05/31/2015 16:58   Dg Hip Unilat With Pelvis 2-3 Views Left  05/30/2015  CLINICAL DATA:  Status post fall, landing on left hip, with left  hip shortening and pain. Initial encounter. EXAM: DG HIP (WITH OR WITHOUT PELVIS) 2-3V LEFT COMPARISON:  None. FINDINGS: There is a minimally displaced slightly comminuted left femoral intertrochanteric fracture, with mild lateral displacement of the distal femur. The left femoral head remains seated at the acetabulum. The right hip  joint is unremarkable in appearance. Right femoral hardware is grossly unremarkable, though incompletely imaged. Mild degenerative change is noted at the sacroiliac joints. Diffuse vascular calcifications are seen. The visualized bowel gas pattern is grossly unremarkable. A prominent sclerotic focus is noted at the right hemipelvis, benign in appearance. IMPRESSION: 1. Minimally displaced slightly comminuted left femoral intertrochanteric fracture, with mild lateral displacement of the distal femur. 2. Diffuse vascular calcifications seen. Electronically Signed   By: Garald Balding M.D.   On: 05/30/2015 23:33   Dg Femur Min 2 Views Left  05/31/2015  CLINICAL DATA:  Left hip pain after fall.  Initial encounter. EXAM: LEFT FEMUR 2 VIEWS COMPARISON:  None. FINDINGS: There is a mildly displaced relatively proximal left femoral intertrochanteric fracture, with sparing of the lesser femoral trochanter and mild lateral displacement. The left femoral head remains seated at the acetabulum. Diffuse vascular calcifications are seen. The knee joint is grossly unremarkable. No knee joint effusion is identified. IMPRESSION: 1. Mildly displaced relatively proximal left femoral intertrochanteric fracture, with sparing of the lesser femoral trochanter and mild lateral displacement. 2. Diffuse vascular calcifications seen. Electronically Signed   By: Garald Balding M.D.   On: 05/31/2015 01:20     Labs:   Basic Metabolic Panel:  Recent Labs Lab 05/30/15 2338 05/31/15 0002 06/01/15 0520 06/02/15 0443  NA 147* 146* 146* 143  K 4.0 3.9 4.5 4.1  CL 108 108 111 112*  CO2 29  --  26 24  GLUCOSE 122* 113* 117* 94  BUN 29* 32* 27* 31*  CREATININE 0.84 0.90 0.97 0.95  CALCIUM 9.5  --  8.8* 8.7*   GFR Estimated Creatinine Clearance: 32.2 mL/min (by C-G formula based on Cr of 0.95). Coagulation profile  Recent Labs Lab 05/31/15 0511  INR 1.13    CBC:  Recent Labs Lab 05/30/15 2338 05/31/15 0002  06/01/15 0520 06/02/15 0443 06/03/15 0525  WBC 9.4  --  8.1 11.5* 8.4  NEUTROABS 7.3  --   --   --   --   HGB 11.6* 13.3 8.3* 7.4* 8.5*  HCT 38.0 39.0 27.4* 24.2* 26.8*  MCV 93.8  --  93.8 95.3 92.1  PLT 283  --  192 177 197   CBG:  Recent Labs Lab 05/31/15 1716 06/01/15 0507  GLUCAP 59 104*   Microbiology Recent Results (from the past 240 hour(s))  Culture, Urine     Status: None   Collection Time: 05/31/15  8:17 AM  Result Value Ref Range Status   Specimen Description URINE, CATHETERIZED  Final   Special Requests NONE  Final   Culture   Final    40,000 COLONIES/ml ENTEROCOCCUS SPECIES Performed at [facility]    Report Status 06/03/2015 FINAL  Final   Organism ID, Bacteria ENTEROCOCCUS SPECIES  Final      Susceptibility   Enterococcus species - MIC*    AMPICILLIN <=2 SENSITIVE Sensitive     LEVOFLOXACIN 0.5 SENSITIVE Sensitive     NITROFURANTOIN <=16 SENSITIVE Sensitive     VANCOMYCIN 1 SENSITIVE Sensitive     * 40,000 COLONIES/ml ENTEROCOCCUS SPECIES  MRSA PCR Screening     Status: None   Collection Time: 05/31/15  2:02  PM  Result Value Ref Range Status   MRSA by PCR NEGATIVE NEGATIVE Final    Comment:        The GeneXpert MRSA Assay (FDA approved for NASAL specimens only), is one component of a comprehensive MRSA colonization surveillance program. It is not intended to diagnose MRSA infection nor to guide or monitor treatment for MRSA infections.      Discharge Instructions:       Discharge Instructions    Call MD for:  extreme fatigue    Complete by:  As directed      Call MD for:  persistant dizziness or light-headedness    Complete by:  As directed      Call MD for:  persistant nausea and vomiting    Complete by:  As directed      Call MD for:  severe uncontrolled pain    Complete by:  As directed      Diet general    Complete by:  As directed      Increase activity slowly    Complete by:  As directed      Walk with assistance     Complete by:  As directed      Walker     Complete by:  As directed             Medication List    STOP taking these medications        aspirin 325 MG tablet  Replaced by:  aspirin EC 325 MG tablet      TAKE these medications        acetaminophen 325 MG tablet  Commonly known as:  TYLENOL  Take 2 tablets (650 mg total) by mouth every 6 (six) hours as needed for mild pain (or Fever >/= 101).     alum & mag hydroxide-simeth 200-200-20 MG/5ML suspension  Commonly known as:  MAALOX/MYLANTA  Take 30 mLs by mouth every 6 (six) hours as needed for indigestion or heartburn (dyspepsia).     ampicillin 250 MG capsule  Commonly known as:  PRINCIPEN  Take 1 capsule (250 mg total) by mouth every 6 (six) hours.     aspirin EC 325 MG tablet  Take 1 tablet (325 mg total) by mouth 2 (two) times daily after a meal.     atorvastatin 10 MG tablet  Commonly known as:  LIPITOR  TAKE 1 TABLET (10 MG TOTAL) BY MOUTH DAILY.     docusate sodium 100 MG capsule  Commonly known as:  COLACE  Take 100 mg by mouth every other day.     donepezil 10 MG tablet  Commonly known as:  ARICEPT  Take 1 tablet (10 mg total) by mouth at bedtime.     feeding supplement (ENSURE ENLIVE) Liqd  Take 237 mLs by mouth daily.     ferrous sulfate 325 (65 FE) MG tablet  Take 1 tablet (325 mg total) by mouth daily with breakfast.     Ipratropium-Albuterol 20-100 MCG/ACT Aers respimat  Commonly known as:  COMBIVENT RESPIMAT  Inhale 1 puff into the lungs every 6 (six) hours as needed for wheezing or shortness of breath.     lisinopril 5 MG tablet  Commonly known as:  PRINIVIL,ZESTRIL  Take 1 tablet (5 mg total) by mouth daily.     metFORMIN 500 MG 24 hr tablet  Commonly known as:  GLUCOPHAGE-XR  TAKE 1 TABLET (500 MG TOTAL) BY MOUTH DAILY WITH BREAKFAST.     methylcellulose 1 %  ophthalmic solution  Commonly known as:  ARTIFICIAL TEARS  Place 1 drop into both eyes as needed (dry eyes).     polyethylene  glycol packet  Commonly known as:  MIRALAX / GLYCOLAX  Take 17 g by mouth daily as needed for mild constipation.     PROBIOTIC DAILY PO  Take 1 tablet by mouth daily.     RESOURCE THICKENUP CLEAR Powd  Thicken liquids to nectar consistency.     traMADol 50 MG tablet  Commonly known as:  ULTRAM  Take 1 tablet (50 mg total) by mouth every 6 (six) hours as needed for moderate pain.       Follow-up Information    Follow up with [facility] .   Specialty:  Skilled Nursing Facility   Contact information:   [address] [place] [phone]      Follow up with [place], Horald Pollen, MD. Schedule an appointment as soon as possible for a visit in 2 weeks.   Specialty:  Orthopedic Surgery   Why:  For wound re-check   Contact information:   [address] [place] [address] [phone]       Schedule an appointment as soon as possible for a visit with Penni Homans, MD.   Specialty:  Family Medicine   Why:  As needed   Contact information:   [place] [address] [phone]        Time coordinating discharge: 35 minutes.  Signed:  Delonna Ney  Pager [phone] [facility] 06/03/2015, 11:17 AM

## 2015-06-03 NOTE — Consult Note (Signed)
   [facility] CM Inpatient Consult   06/03/2015  Briana Thornton December 27, 1926 [id number]   Patient evaluated for [place] Management services. Spoke with patient at bedside. However, she is a little confused. Advised by patient's nurse that writer should speak with patient's daughter, [name]. Currently patient's daughter is in meeting with SNF liaison. Will follow up at later time to discuss [place] Management follow up with daughter.   Marthenia Rolling, MSN-Ed, RN,BSN [facility] Liaison [phone]

## 2015-06-06 ENCOUNTER — Non-Acute Institutional Stay (SKILLED_NURSING_FACILITY): Payer: Medicare Other | Admitting: Internal Medicine

## 2015-06-06 DIAGNOSIS — E440 Moderate protein-calorie malnutrition: Secondary | ICD-10-CM | POA: Diagnosis not present

## 2015-06-06 DIAGNOSIS — D62 Acute posthemorrhagic anemia: Secondary | ICD-10-CM | POA: Diagnosis not present

## 2015-06-06 DIAGNOSIS — I10 Essential (primary) hypertension: Secondary | ICD-10-CM | POA: Diagnosis not present

## 2015-06-06 DIAGNOSIS — R2681 Unsteadiness on feet: Secondary | ICD-10-CM

## 2015-06-06 DIAGNOSIS — K5900 Constipation, unspecified: Secondary | ICD-10-CM | POA: Diagnosis not present

## 2015-06-06 DIAGNOSIS — N390 Urinary tract infection, site not specified: Secondary | ICD-10-CM | POA: Diagnosis not present

## 2015-06-06 DIAGNOSIS — R531 Weakness: Secondary | ICD-10-CM | POA: Diagnosis not present

## 2015-06-06 DIAGNOSIS — R1310 Dysphagia, unspecified: Secondary | ICD-10-CM | POA: Diagnosis not present

## 2015-06-06 DIAGNOSIS — E1121 Type 2 diabetes mellitus with diabetic nephropathy: Secondary | ICD-10-CM

## 2015-06-06 DIAGNOSIS — F0390 Unspecified dementia without behavioral disturbance: Secondary | ICD-10-CM

## 2015-06-06 DIAGNOSIS — E785 Hyperlipidemia, unspecified: Secondary | ICD-10-CM | POA: Diagnosis not present

## 2015-06-06 DIAGNOSIS — S72142D Displaced intertrochanteric fracture of left femur, subsequent encounter for closed fracture with routine healing: Secondary | ICD-10-CM

## 2015-06-06 DIAGNOSIS — B952 Enterococcus as the cause of diseases classified elsewhere: Secondary | ICD-10-CM

## 2015-06-06 NOTE — Progress Notes (Signed)
Patient ID: Briana Thornton, female   DOB: 10-14-1926, 82 y.o.   MRN: [id number]     [facility]   PCP: Penni Homans, MD  Code Status: DNR  No Known Allergies  Chief Complaint  Patient presents with  . New Admit To SNF     HPI:  82 y.o. patient is here for short term rehabilitation post hospital admission from 05/30/15-06/03/15 with left hip intertrochanteric fracture. She underwent surgical repair with intramedullary fixation on 05/31/15. Post op she had blood loss anemia and dysphagia along with Enterococcus UTI. She was seen by SLP team and is now on dysphagia diet. She received 1 u prbc transfusion on 06/02/15. She was started on antibiotics for her UTI.She has PMH of HTN, HLD, DM, dementia among others. She is seen in her room today. She is alert and oriented to person. She denies any concerns. Her pain is under control at rest but movement worsens it.   Review of Systems:  Constitutional: Negative for fever, chills HENT: Negative for headache, congestion, nasal discharge Eyes: Negative for blurred vision, double vision and discharge.  Respiratory: Negative for cough, shortness of breath and wheezing.   Cardiovascular: Negative for chest pain, palpitations, leg swelling.  Gastrointestinal: Negative for heartburn, nausea, vomiting, abdominal pain. Has been constipated Genitourinary: Negative for dysuria and flank pain.  Musculoskeletal: Negative for back pain, falls in the facility Skin: Negative for itching, rash.  Neurological: Negative for dizziness Psychiatric/Behavioral: positive for memory loss.    Past Medical History  Diagnosis Date  . Chicken pox as a child  . Measles as a child  . IBS (irritable bowel syndrome)   . Diabetes mellitus 47    type 2  . Hyperlipidemia 72  . Hypertension 72  . Vision loss of right eye   . Vertigo 2010    benign  . Calcification of cartilage     ear  . Cardiovascular system disease 07/02/2011  .  Hemorrhoid 07/02/2011  . Osteoporosis 07/02/2011  . Weight loss 07/31/2011  . Anemia 05/22/2012  . Broken wrist 10-22-11    left  . Depression 05/22/2012  . Dermatitis 05/22/2012    Right neck  . DM type 2 with diabetic peripheral neuropathy ([place]) 05/18/2015   Past Surgical History  Procedure Laterality Date  . Hemorroidectomy  1979  . Knee scoped  2002    right  . Rotator cuff repair  2004    right  . Abdominal hysterectomy  2008    partial still has ovaries  . Lens implant left  2006  . Pacemaker insertion  2010  . Wrist surgery  11-05-11    left wrist  . Intramedullary (im) nail intertrochanteric Right 07/24/2013    Procedure: INTRAMEDULLARY (IM) NAIL INTERTROCHANTRIC;  Surgeon: Gearlean Alf, MD;  Location: [facility];  Service: Orthopedics;  Laterality: Right;  . Intramedullary (im) nail intertrochanteric Left 05/31/2015    Procedure: INTRAMEDULLARY (IM) NAIL INTERTROCHANTRIC;  Surgeon: Rod Can, MD;  Location: [facility];  Service: Orthopedics;  Laterality: Left;   Social History:   reports that she has never smoked. She has never used smokeless tobacco. She reports that she does not drink alcohol or use illicit drugs.  Family History  Problem Relation Age of Onset  . Diabetes Mother     type 2  . Hypertension Mother   . Cancer Brother     lung-smoker  . Diabetes Daughter     pre diabetic  . Diabetes Son  type 2  . Cancer Maternal Grandfather     prostate    Medications:   Medication List       This list is accurate as of: 06/06/15 10:18 AM.  Always use your most recent med list.               acetaminophen 325 MG tablet  Commonly known as:  TYLENOL  Take 2 tablets (650 mg total) by mouth every 6 (six) hours as needed for mild pain (or Fever >/= 101).     alum & mag hydroxide-simeth 200-200-20 MG/5ML suspension  Commonly known as:  MAALOX/MYLANTA  Take 30 mLs by mouth every 6 (six) hours as needed for indigestion or heartburn (dyspepsia).     ampicillin  250 MG capsule  Commonly known as:  PRINCIPEN  Take 1 capsule (250 mg total) by mouth every 6 (six) hours.     aspirin EC 325 MG tablet  Take 1 tablet (325 mg total) by mouth 2 (two) times daily after a meal.     atorvastatin 10 MG tablet  Commonly known as:  LIPITOR  TAKE 1 TABLET (10 MG TOTAL) BY MOUTH DAILY.     docusate sodium 100 MG capsule  Commonly known as:  COLACE  Take 100 mg by mouth every other day.     donepezil 10 MG tablet  Commonly known as:  ARICEPT  Take 1 tablet (10 mg total) by mouth at bedtime.     feeding supplement (ENSURE ENLIVE) Liqd  Take 237 mLs by mouth daily.     ferrous sulfate 325 (65 FE) MG tablet  Take 1 tablet (325 mg total) by mouth daily with breakfast.     Ipratropium-Albuterol 20-100 MCG/ACT Aers respimat  Commonly known as:  COMBIVENT RESPIMAT  Inhale 1 puff into the lungs every 6 (six) hours as needed for wheezing or shortness of breath.     lisinopril 5 MG tablet  Commonly known as:  PRINIVIL,ZESTRIL  Take 1 tablet (5 mg total) by mouth daily.     metFORMIN 500 MG 24 hr tablet  Commonly known as:  GLUCOPHAGE-XR  TAKE 1 TABLET (500 MG TOTAL) BY MOUTH DAILY WITH BREAKFAST.     methylcellulose 1 % ophthalmic solution  Commonly known as:  ARTIFICIAL TEARS  Place 1 drop into both eyes as needed (dry eyes).     polyethylene glycol packet  Commonly known as:  MIRALAX / GLYCOLAX  Take 17 g by mouth daily as needed for mild constipation.     PROBIOTIC DAILY PO  Take 1 tablet by mouth daily.     RESOURCE THICKENUP CLEAR Powd  Thicken liquids to nectar consistency.     traMADol 50 MG tablet  Commonly known as:  ULTRAM  Take 1 tablet (50 mg total) by mouth every 6 (six) hours as needed for moderate pain.         Physical Exam: Filed Vitals:   06/06/15 1018  BP: 123/88  Pulse: 89  Temp: 97 F (36.1 C)  Resp: 19  SpO2: 97%    General- elderly female, thin built, in no acute distress Head- normocephalic,  atraumatic Nose- no maxillary or frontal sinus tenderness, no nasal discharge Throat- moist mucus membrane Eyes- PERRLA, EOMI, no pallor, no icterus, no discharge, normal conjunctiva, normal sclera Neck- no cervical lymphadenopathy Cardiovascular- normal s1,s2, no murmurs, trace leg edema Respiratory- bilateral clear to auscultation, no wheeze, no rhonchi, no crackles, no use of accessory muscles Abdomen- bowel sounds present, soft, non  tender Musculoskeletal- able to move all 4 extremities, limited left leg ROM, unsteady gait Neurological- alert and oriented to person only Skin- warm and dry, left thigh dressing in place and clean and dry Psychiatry- normal mood and affect    Labs reviewed: Basic Metabolic Panel:  Recent Labs  03/05/15 1857  05/30/15 2338 05/31/15 0002 06/01/15 0520 06/02/15 0443  NA  --   < > 147* 146* 146* 143  K  --   < > 4.0 3.9 4.5 4.1  CL  --   < > 108 108 111 112*  CO2  --   < > 29  --  26 24  GLUCOSE  --   < > 122* 113* 117* 94  BUN  --   < > 29* 32* 27* 31*  CREATININE 0.90  < > 0.84 0.90 0.97 0.95  CALCIUM  --   < > 9.5  --  8.8* 8.7*  MG 1.8  --   --   --   --   --   PHOS 2.6  --   --   --   --   --   < > = values in this interval not displayed. Liver Function Tests:  Recent Labs  05/16/15 1448 05/16/15 2210 05/19/15 0530 05/24/15 0530  AST 18 17 13*  --   ALT 10* 10* 9*  --   ALKPHOS 74 71 65  --   BILITOT 0.8 0.9 1.0  --   PROT 6.6 5.9* 5.3*  --   ALBUMIN 3.7 3.2* 2.9* 3.0*   No results for input(s): LIPASE, AMYLASE in the last 8760 hours. No results for input(s): AMMONIA in the last 8760 hours. CBC:  Recent Labs  05/19/15 0530 05/24/15 0530 05/30/15 2338  06/01/15 0520 06/02/15 0443 06/03/15 0525  WBC 9.2 5.6 9.4  --  8.1 11.5* 8.4  NEUTROABS 6.6 2.8 7.3  --   --   --   --   HGB 11.4* 9.9* 11.6*  < > 8.3* 7.4* 8.5*  HCT 36.2 30.8* 38.0  < > 27.4* 24.2* 26.8*  MCV 91.9 91.4 93.8  --  93.8 95.3 92.1  PLT 249 252 283  --   192 177 197  < > = values in this interval not displayed. Cardiac Enzymes: No results for input(s): CKTOTAL, CKMB, CKMBINDEX, TROPONINI in the last 8760 hours. BNP: Invalid input(s): POCBNP CBG:  Recent Labs  05/24/15 1413 05/31/15 1716 06/01/15 0507  GLUCAP 172* 74 104*    Radiological Exams: Dg Chest 1 View  05/31/2015  CLINICAL DATA:  Preoperative chest radiograph for left femoral fracture. Initial encounter. EXAM: CHEST 1 VIEW COMPARISON:  Chest radiograph performed 05/16/2015 FINDINGS: The lungs are mildly hypoexpanded. No focal consolidation, pleural effusion or pneumothorax is seen. The cardiomediastinal silhouette is borderline enlarged. A pacemaker is noted overlying the left chest wall, with leads ending overlying the right atrium and right ventricle. Dense calcification is suggested at the mitral valve. No acute osseous abnormalities are identified. IMPRESSION: 1. Lungs mildly hypoexpanded but grossly clear. 2. Borderline cardiomegaly. Dense calcification suggested at the mitral valve. Electronically Signed   By: Garald Balding M.D.   On: 05/31/2015 01:18   Ct Head Wo Contrast  05/31/2015  CLINICAL DATA:  Status post fall while going from bedroom to bathroom. Concern for head injury. Initial encounter. EXAM: CT HEAD WITHOUT CONTRAST TECHNIQUE: Contiguous axial images were obtained from the base of the skull through the vertex without intravenous contrast. COMPARISON:  CT of  the head performed 05/18/2015 FINDINGS: There is no evidence of acute infarction, or intra- or extra-axial hemorrhage on CT. Moderate cortical volume loss is noted, with prominence of the ventricles and sulci. Cerebellar atrophy is seen. Relatively diffuse periventricular and subcortical white matter change likely reflects small vessel ischemic microangiopathy. Chronic lacunar infarcts are seen at the basal ganglia bilaterally. A chronic lacunar infarct is also noted at the left side of the pons. A 2.0 cm densely  calcified meningioma is again noted along the inferior aspect of the anterior falx cerebri, grossly stable in appearance. The brainstem and fourth ventricle are within normal limits. The basal ganglia are unremarkable in appearance. The cerebral hemispheres demonstrate grossly normal gray-white differentiation. No mass effect or midline shift is seen. There is no evidence of fracture; visualized osseous structures are unremarkable in appearance. The orbits are within normal limits. The paranasal sinuses and mastoid air cells are well-aerated. No significant soft tissue abnormalities are seen. IMPRESSION: 1. No acute intracranial pathology seen on CT. 2. Moderate cortical volume loss and diffuse small vessel ischemic microangiopathy. 3. Chronic lacunar infarcts at the basal ganglia bilaterally, and chronic lacunar infarct at the left side of the pons. 4. 2.0 cm densely calcified meningioma again noted along the inferior aspect of the anterior falx cerebri. Electronically Signed   By: Garald Balding M.D.   On: 05/31/2015 01:09   Pelvis Portable  05/31/2015  CLINICAL DATA:  82 year old female with a history of left hip open reduction internal fixation. EXAM: PORTABLE PELVIS 1-2 VIEWS COMPARISON:  05/31/2015 FINDINGS: Early postop changes of left femoral neck fixation with antegrade intra medullary rod placement with left femoral neck gamma rod and single distal interlocking screw. Alignment relatively maintained at the fracture site at the left femoral neck. Subcutaneous gas within the proximal left thigh. Diffuse osteopenia, with postoperative changes of prior right femoral neck fixation again noted. Surgical catheter projects over the midline. Dense atherosclerotic calcifications of the femoral popliteal vasculature. IMPRESSION: Early postoperative changes of left femoral neck fixation, as above. Atherosclerosis. Signed, Dulcy Fanny. Earleen Newport, DO [facility]  Electronically Signed   By: Corrie Mckusick D.O.   On: 05/31/2015 18:04   Dg C-arm 1-60 Min-no Report  05/31/2015  CLINICAL DATA: Surgery - Left  IM Nail C-ARM 1-60 MINUTES Fluoroscopy was utilized by the requesting physician.  No radiographic interpretation.   Dg Hip Operative Unilat With Pelvis Left  05/31/2015  CLINICAL DATA:  LEFT femur fracture EXAM: OPERATIVE LEFT HIP (WITH PELVIS IF PERFORMED) 2 new VIEWS TECHNIQUE: Fluoroscopic spot image(s) were submitted for interpretation post-operatively. COMPARISON:  Radiograph 05/30/2015. FINDINGS: Insert medullary nail fixation of LEFT intertrochanteric fracture. Compression screw and distal fixation screw noted. IMPRESSION: No complication following intra medullary nail fixation of LEFT femur fracture Electronically Signed   By: Suzy Bouchard M.D.   On: 05/31/2015 16:58   Dg Hip Unilat With Pelvis 2-3 Views Left  05/30/2015  CLINICAL DATA:  Status post fall, landing on left hip, with left hip shortening and pain. Initial encounter. EXAM: DG HIP (WITH OR WITHOUT PELVIS) 2-3V LEFT COMPARISON:  None. FINDINGS: There is a minimally displaced slightly comminuted left femoral intertrochanteric fracture, with mild lateral displacement of the distal femur. The left femoral head remains seated at the acetabulum. The right hip joint is unremarkable in appearance. Right femoral hardware is grossly unremarkable, though incompletely imaged. Mild degenerative change is noted at the sacroiliac joints. Diffuse vascular calcifications are seen. The visualized bowel gas pattern  is grossly unremarkable. A prominent sclerotic focus is noted at the right hemipelvis, benign in appearance. IMPRESSION: 1. Minimally displaced slightly comminuted left femoral intertrochanteric fracture, with mild lateral displacement of the distal femur. 2. Diffuse vascular calcifications seen. Electronically Signed   By: Garald Balding M.D.   On: 05/30/2015 23:33   Dg Femur Min 2 Views  Left  05/31/2015  CLINICAL DATA:  Left hip pain after fall.  Initial encounter. EXAM: LEFT FEMUR 2 VIEWS COMPARISON:  None. FINDINGS: There is a mildly displaced relatively proximal left femoral intertrochanteric fracture, with sparing of the lesser femoral trochanter and mild lateral displacement. The left femoral head remains seated at the acetabulum. Diffuse vascular calcifications are seen. The knee joint is grossly unremarkable. No knee joint effusion is identified. IMPRESSION: 1. Mildly displaced relatively proximal left femoral intertrochanteric fracture, with sparing of the lesser femoral trochanter and mild lateral displacement. 2. Diffuse vascular calcifications seen. Electronically Signed   By: Garald Balding M.D.   On: 05/31/2015 01:20     Assessment/Plan  Unsteady gait Post left hip fracture, s/p IM nailing, Will have patient work with PT/OT as tolerated to regain strength and restore function.  Fall precautions are in place.  Generalized weakness With fracture and surgery, UTI and ABLA. Will have her work with physical therapy and occupational therapy team to help with gait training and muscle strengthening exercises.fall precautions. Skin care. Encourage to be out of bed.   Left hip fracture S/p IM nailing on 05/31/15. Has f/u with orthopedics. Continue ASA EC 325 mg bid for dvt prophylaxis. On tramadol 50 mg q6h prn pain, continue this and add tylenol extra strength 1000 mg bid for pain. Will have patient work with PT/OT as tolerated to regain strength and restore function.  Fall precautions are in place.  Acute blood loss anemia Post op, s/p 1 u prbc transfusion, check cbc. Continue feso4 325 mg daily  Enterococcus UTI Currently on ampicillin 250 mg qid, continue and complete course on 06/08/15. Hydration to be maintained  Dysphagia Continue dysphagia 3, nectar liquid diet for now. Will have her follow with SLP. Aspiration precautions and full assistance with meals for  now  Dementia On aricept 10 mg daily, to provide assistance with ADLs, fall precautions, pressure ulcer prophylaxis  Constipation On colace 100 mg qod and miralax prn, has not had bowel movement for > 3 days. Change colace to 100 mg daily and miralax daily x 3 days, then daily prn and monitor  Protein calorie malnutrition Get dietary consult. Continue feeding supplement  Hypertension  Stable for now, currently on lisinopril 5 mg daily, monitor bp and BMP  DM Monitor cbg. continue metformin 500 mg daily, continue statin Lab Results  Component Value Date   HGBA1C 5.8* 05/17/2015    Hyperlipidemia  continue Lipitor 10 mg daily    Goals of care: short term rehabilitation   Labs/tests ordered: cbc with diff, cmp 06/08/14  Family/ staff Communication: reviewed care plan with patient and nursing supervisor    Blanchie Serve, MD  [place] [phone] (Monday-Friday 8 am - 5 pm) [phone] (afterhours)

## 2015-06-09 DIAGNOSIS — I10 Essential (primary) hypertension: Secondary | ICD-10-CM | POA: Diagnosis not present

## 2015-06-09 DIAGNOSIS — D508 Other iron deficiency anemias: Secondary | ICD-10-CM | POA: Diagnosis not present

## 2015-06-09 LAB — CBC AND DIFFERENTIAL
HCT: 28 % — AB (ref 36–46)
Hemoglobin: 8.6 g/dL — AB (ref 12.0–16.0)
NEUTROS ABS: 5 /uL
PLATELETS: 337 10*3/uL (ref 150–399)
WBC: 8 10*3/mL

## 2015-06-09 LAB — HEPATIC FUNCTION PANEL
ALT: 4 U/L — AB (ref 7–35)
AST: 12 U/L — AB (ref 13–35)
Alkaline Phosphatase: 64 U/L (ref 25–125)
Bilirubin, Total: 1.3 mg/dL

## 2015-06-09 LAB — BASIC METABOLIC PANEL
BUN: 19 mg/dL (ref 4–21)
CREATININE: 0.8 mg/dL (ref 0.5–1.1)
POTASSIUM: 3.7 mmol/L (ref 3.4–5.3)
Sodium: 144 mmol/L (ref 137–147)

## 2015-06-17 ENCOUNTER — Ambulatory Visit: Payer: Medicare Other | Admitting: Family Medicine

## 2015-06-17 DIAGNOSIS — E119 Type 2 diabetes mellitus without complications: Secondary | ICD-10-CM | POA: Diagnosis not present

## 2015-06-17 LAB — HEMOGLOBIN A1C: HEMOGLOBIN A1C: 5.3

## 2015-06-20 DIAGNOSIS — I10 Essential (primary) hypertension: Secondary | ICD-10-CM | POA: Diagnosis not present

## 2015-06-20 LAB — LIPID PANEL
CHOLESTEROL: 103 mg/dL (ref 0–200)
HDL: 48 mg/dL (ref 35–70)
LDL Cholesterol: 42 mg/dL
TRIGLYCERIDES: 67 mg/dL (ref 40–160)

## 2015-06-25 ENCOUNTER — Other Ambulatory Visit: Payer: Self-pay | Admitting: Physical Medicine and Rehabilitation

## 2015-06-25 ENCOUNTER — Other Ambulatory Visit: Payer: Self-pay | Admitting: Family Medicine

## 2015-06-30 ENCOUNTER — Encounter: Payer: Self-pay | Admitting: Adult Health

## 2015-06-30 ENCOUNTER — Non-Acute Institutional Stay (SKILLED_NURSING_FACILITY): Payer: Medicare Other | Admitting: Adult Health

## 2015-06-30 DIAGNOSIS — S72142D Displaced intertrochanteric fracture of left femur, subsequent encounter for closed fracture with routine healing: Secondary | ICD-10-CM

## 2015-06-30 DIAGNOSIS — E119 Type 2 diabetes mellitus without complications: Secondary | ICD-10-CM

## 2015-06-30 DIAGNOSIS — D62 Acute posthemorrhagic anemia: Secondary | ICD-10-CM

## 2015-06-30 DIAGNOSIS — I10 Essential (primary) hypertension: Secondary | ICD-10-CM

## 2015-06-30 DIAGNOSIS — R1310 Dysphagia, unspecified: Secondary | ICD-10-CM

## 2015-06-30 DIAGNOSIS — F0390 Unspecified dementia without behavioral disturbance: Secondary | ICD-10-CM

## 2015-06-30 DIAGNOSIS — E43 Unspecified severe protein-calorie malnutrition: Secondary | ICD-10-CM

## 2015-06-30 DIAGNOSIS — R531 Weakness: Secondary | ICD-10-CM

## 2015-06-30 DIAGNOSIS — K5900 Constipation, unspecified: Secondary | ICD-10-CM | POA: Diagnosis not present

## 2015-06-30 DIAGNOSIS — E785 Hyperlipidemia, unspecified: Secondary | ICD-10-CM

## 2015-06-30 NOTE — Progress Notes (Signed)
Patient ID: Briana Thornton, female   DOB: 11-16-1926, 82 y.o.   MRN: [id number]    DATE:  06/30/2015   MRN:  [id number]  BIRTHDAY: [birth year]  Facility:  Nursing Home Location:  [place] Room Number: 1206-P  LEVEL OF CARE:  SNF (31)  Contact Information    Name Relation Home Work [place] Daughter [phone]   Dorian Heckle [phone]     Schoenfeld,Andrew Son   [phone]       Code Status History    Date Active Date Inactive Code Status Order ID Comments User Context   05/31/2015  9:01 PM 06/03/2015 10:28 PM DNR [id number]  Gardiner Barefoot, NP Inpatient   05/31/2015  6:38 PM 05/31/2015  9:01 PM Full Code [id number]  Cathlyn Parsons, PA-C Inpatient   05/31/2015  6:38 PM 05/31/2015  6:38 PM DNR [id number]  Rod Can, MD Inpatient   05/31/2015  9:58 AM 05/31/2015  6:38 PM DNR [id number]  Oswald Hillock, MD Inpatient   05/31/2015  2:09 AM 05/31/2015  9:58 AM Full Code [id number]  Norval Morton, MD Inpatient   05/18/2015  6:36 PM 05/28/2015  2:09 PM DNR [id number]  Cathlyn Parsons, PA-C Inpatient   05/16/2015  9:12 PM 05/18/2015  6:36 PM DNR [id number]  Edwin Dada, MD Inpatient   03/05/2015  5:39 PM 03/08/2015  6:51 PM DNR [id number]  Barton Dubois, MD Inpatient   07/24/2013  7:44 PM 07/27/2013  5:35 PM DNR [id number]  Gearlean Alf, MD Inpatient   07/24/2013  3:25 PM 07/24/2013  7:44 PM Full Code [id number]  [place], DO Inpatient    Questions for Most Recent Historical Code Status (Order [id number])    Question Answer Comment   In the event of cardiac or respiratory ARREST Do not call a "code blue"    In the event of cardiac or respiratory ARREST Do not perform Intubation, CPR, defibrillation or ACLS    In the event of cardiac or respiratory ARREST Use medication by any route, position, wound care, and other measures to relive pain and suffering. May use oxygen, suction and manual treatment of airway  obstruction as needed for comfort.     Advance Directive Documentation        Most Recent Value   Type of Advance Directive  Out of facility DNR (pink MOST or yellow form)   Pre-existing out of facility DNR order (yellow form or pink MOST form)     "MOST" Form in Place?         Chief Complaint  Patient presents with  . Medical Management of Chronic Issues    Generalized weakness, left hip fracture S/P IM nailing, anemia, dysphagia, dementia, constipation, protein calorie malnutrition, hypertension, diabetes mellitus and hyperlipidemia    HISTORY OF PRESENT ILLNESS:  This is an 82 year old female who is being seen for a routine visit. Latest hemoglobin A1c is 5.3 and currently taking metformin 500 mg daily for diabetes mellitus. Latest hemoglobin is 8.6 and currently on ferrous sulfate 325 mg daily. She had transfusion of 1 unit packed RBC on 06/02/15 for acute blood loss anemia . She has been admitted to [facility] on 06/03/15 from [facility] with left hip intertrochanteric fracture or which she had repair with intramedullary fixation on 05/31/15. She was treated for UTI in the hospital and was discharged on ampicillin which was completed on 06/08/15. She has been admitted for a short-term  rehabilitation.     PAST MEDICAL HISTORY:  Past Medical History  Diagnosis Date  . Chicken pox as a child  . Measles as a child  . IBS (irritable bowel syndrome)   . Diabetes mellitus 47    type 2  . Hyperlipidemia 72  . Hypertension 72  . Vision loss of right eye   . Vertigo 2010    benign  . Calcification of cartilage     ear  . Cardiovascular system disease 07/02/2011  . Hemorrhoid 07/02/2011  . Osteoporosis 07/02/2011  . Weight loss 07/31/2011  . Anemia 05/22/2012  . Broken wrist 10-22-11    left  . Depression 05/22/2012  . Dermatitis 05/22/2012    Right neck  . DM type 2 with diabetic peripheral neuropathy ([place]) 05/18/2015  . Unsteady gait   . Generalized weakness   . Closed  intertrochanteric fracture of left hip ([place])   . Enterococcus UTI   . Dysphagia   . Postoperative anemia due to acute blood loss   . Malnutrition of moderate degree ([place])   . Benign essential HTN   . HLD (hyperlipidemia)   . Constipation      CURRENT MEDICATIONS: Reviewed  Patient's Medications  New Prescriptions   No medications on file  Previous Medications   ACETAMINOPHEN (TYLENOL) 325 MG TABLET    Take 650 mg by mouth every 6 (six) hours as needed for mild pain or fever (Fever >/= 101).   ACETAMINOPHEN (TYLENOL) 500 MG TABLET    Take 1,000 mg by mouth every 8 (eight) hours as needed.   ALUM & MAG HYDROXIDE-SIMETH (MAALOX/MYLANTA) 200-200-20 MG/5ML SUSPENSION    Take 30 mLs by mouth every 6 (six) hours as needed for indigestion or heartburn (dyspepsia).   ASPIRIN EC 325 MG TABLET    Take 325 mg by mouth 2 (two) times daily. For 30 days for prophylaxis.  Stop 07/21/15   ATORVASTATIN (LIPITOR) 10 MG TABLET    TAKE 1 TABLET (10 MG TOTAL) BY MOUTH DAILY.   DOCUSATE SODIUM (COLACE) 100 MG CAPSULE    Take 100 mg by mouth daily.    DONEPEZIL (ARICEPT) 10 MG TABLET    Take 1 tablet (10 mg total) by mouth at bedtime.   FERROUS SULFATE 325 (65 FE) MG TABLET    TAKE 1 TABLET (325 MG TOTAL) BY MOUTH DAILY WITH BREAKFAST.   IPRATROPIUM-ALBUTEROL (COMBIVENT RESPIMAT) 20-100 MCG/ACT AERS RESPIMAT    Inhale 1 puff into the lungs every 6 (six) hours as needed for wheezing or shortness of breath.   LISINOPRIL (PRINIVIL,ZESTRIL) 5 MG TABLET    TAKE 1 TABLET BY MOUTH EVERY DAY   METFORMIN (GLUCOPHAGE-XR) 500 MG 24 HR TABLET    TAKE 1 TABLET (500 MG TOTAL) BY MOUTH DAILY WITH BREAKFAST.   METHYLCELLULOSE (ARTIFICIAL TEARS) 1 % OPHTHALMIC SOLUTION    Place 1 drop into both eyes as needed (dry eyes).    POLYETHYLENE GLYCOL (MIRALAX / GLYCOLAX) PACKET    Take 17 g by mouth daily as needed for mild constipation.   PROBIOTIC PRODUCT (PROBIOTIC DAILY PO)    Take 1 tablet by mouth daily.   TRAMADOL (ULTRAM) 50  MG TABLET    Take 1 tablet (50 mg total) by mouth every 6 (six) hours as needed for moderate pain.   UNABLE TO FIND    Take 120 mLs by mouth daily. Med Name: MedPass  Modified Medications   No medications on file  Discontinued Medications   ACETAMINOPHEN (TYLENOL) 325 MG  TABLET    Take 2 tablets (650 mg total) by mouth every 6 (six) hours as needed for mild pain (or Fever >/= 101).   ASPIRIN EC 325 MG TABLET    Take 1 tablet (325 mg total) by mouth 2 (two) times daily after a meal.   FEEDING SUPPLEMENT, ENSURE ENLIVE, (ENSURE ENLIVE) LIQD    Take 237 mLs by mouth daily.   MALTODEXTRIN-XANTHAN GUM (RESOURCE THICKENUP CLEAR) POWD    Thicken liquids to nectar consistency.     No Known Allergies   REVIEW OF SYSTEMS:  GENERAL: no change in appetite, no fatigue, no weight changes, no fever, chills or weakness EYES: Denies change in vision, dry eyes, eye pain, itching or discharge EARS: Denies change in hearing, ringing in ears, or earache NOSE: Denies nasal congestion or epistaxis MOUTH and THROAT: Denies oral discomfort, gingival pain or bleeding, pain from teeth or hoarseness   RESPIRATORY: no cough, SOB, DOE, wheezing, hemoptysis CARDIAC: no chest pain, edema or palpitations GI: no abdominal pain, diarrhea, constipation, heart burn, nausea or vomiting GU: Denies dysuria, frequency, hematuria, incontinence, or discharge PSYCHIATRIC: Denies feeling of depression or anxiety. No report of hallucinations, insomnia, paranoia, or agitation   PHYSICAL EXAMINATION  GENERAL APPEARANCE: Well nourished. In no acute distress. Normal body habitus SKINleft lateral thigh incision is healed and left hip surgical incision is healed HEAD: Normal in size and contour. No evidence of trauma EYES: Lids open and close normally. No blepharitis, entropion or ectropion. PERRL. Conjunctivae are clear and sclerae are white. Lenses are without opacity EARS: Pinnae are normal. Patient hears normal voice tunes of  the examiner MOUTH and THROAT: Lips are without lesions. Oral mucosa is moist and without lesions. Tongue is normal in shape, size, and color and without lesions NECK: supple, trachea midline, no neck masses, no thyroid tenderness, no thyromegaly LYMPHATICS: no LAN in the neck, no supraclavicular LAN RESPIRATORY: breathing is even & unlabored, BS CTAB CARDIAC: RRR, no murmur,no extra heart sounds, no edema; left chest pacemaker EXTREMITIES:  Able to move 4 extremities  PSYCHIATRIC: Alert and oriented to person and place. Affect and behavior are appropriate  LABS/RADIOLOGY: Labs reviewed: Basic Metabolic Panel:  Recent Labs  03/05/15 1857  05/30/15 2338 05/31/15 0002 06/01/15 0520 06/02/15 0443 06/09/15  NA  --   < > 147* 146* 146* 143 144  K  --   < > 4.0 3.9 4.5 4.1 3.7  CL  --   < > 108 108 111 112*  --   CO2  --   < > 29  --  26 24  --   GLUCOSE  --   < > 122* 113* 117* 94  --   BUN  --   < > 29* 32* 27* 31* 19  CREATININE 0.90  < > 0.84 0.90 0.97 0.95 0.8  CALCIUM  --   < > 9.5  --  8.8* 8.7*  --   MG 1.8  --   --   --   --   --   --   PHOS 2.6  --   --   --   --   --   --   < > = values in this interval not displayed. Liver Function Tests:  Recent Labs  05/16/15 1448 05/16/15 2210 05/19/15 0530 05/24/15 0530 06/09/15  AST 18 17 13*  --  12*  ALT 10* 10* 9*  --  4*  ALKPHOS 74 71 65  --  64  BILITOT 0.8 0.9 1.0  --   --   PROT 6.6 5.9* 5.3*  --   --   ALBUMIN 3.7 3.2* 2.9* 3.0*  --    CBC:  Recent Labs  05/24/15 0530 05/30/15 2338  06/01/15 0520 06/02/15 0443 06/03/15 0525 06/09/15  WBC 5.6 9.4  --  8.1 11.5* 8.4 8.0  NEUTROABS 2.8 7.3  --   --   --   --  5  HGB 9.9* 11.6*  < > 8.3* 7.4* 8.5* 8.6*  HCT 30.8* 38.0  < > 27.4* 24.2* 26.8* 28*  MCV 91.4 93.8  --  93.8 95.3 92.1  --   PLT 252 283  --  192 177 197 337  < > = values in this interval not displayed.  Lipid Panel:  Recent Labs  10/21/14 1105 05/17/15 0822 06/20/15  HDL 74.60 65 48    CBG:  Recent Labs  05/24/15 1413 05/31/15 1716 06/01/15 0507  GLUCAP 172* 74 104*      Pelvis Portable  05/31/2015  CLINICAL DATA:  82 year old female with a history of left hip open reduction internal fixation. EXAM: PORTABLE PELVIS 1-2 VIEWS COMPARISON:  05/31/2015 FINDINGS: Early postop changes of left femoral neck fixation with antegrade intra medullary rod placement with left femoral neck gamma rod and single distal interlocking screw. Alignment relatively maintained at the fracture site at the left femoral neck. Subcutaneous gas within the proximal left thigh. Diffuse osteopenia, with postoperative changes of prior right femoral neck fixation again noted. Surgical catheter projects over the midline. Dense atherosclerotic calcifications of the femoral popliteal vasculature. IMPRESSION: Early postoperative changes of left femoral neck fixation, as above. Atherosclerosis. Signed, Dulcy Fanny. Earleen Newport, DO [facility] Electronically Signed   By: Corrie Mckusick D.O.   On: 05/31/2015 18:04   Dg Swallowing Func-speech Pathology  06/02/2015  Objective Swallowing Evaluation:   Patient Details Name: Senora Ketchum MRN: [id number] Date of Birth: [birth year] Today's Date: 06/02/2015 Time: SLP Start Time (ACUTE ONLY): 1346-SLP Stop Time (ACUTE ONLY): 1416 SLP Time Calculation (min) (ACUTE ONLY): 30 min Past Medical History: @PMH @ Past Surgical History: Past Surgical History Procedure Laterality Date . Hemorroidectomy  1979 . Knee scoped  2002   right . Rotator cuff repair  2004   right . Abdominal hysterectomy  2008   partial still has ovaries . Lens implant left  2006 . Pacemaker insertion  2010 . Wrist surgery  11-05-11   left wrist . Intramedullary (im) nail intertrochanteric Right 07/24/2013   Procedure: INTRAMEDULLARY (IM) NAIL INTERTROCHANTRIC;  Surgeon: Gearlean Alf, MD;  Location: [facility];  Service: Orthopedics;  Laterality: Right; . Intramedullary  (im) nail intertrochanteric Left 05/31/2015   Procedure: INTRAMEDULLARY (IM) NAIL INTERTROCHANTRIC;  Surgeon: Rod Can, MD;  Location: [facility];  Service: Orthopedics;  Laterality: Left; HPI: 82 y.o. female with a past medical history significant for recent pna with rehab stay, NIDDM, HTN, CHB with pacemaker, dementia, dysphagia, possible left pontine infarct and new fall with fx - s/p surgery.  Pt had recently discharged from rehab and was at home. Daughter reports she was doing well and dressed herself on date of fall.  Swallow evaluation ordered.  CXR upon admit negative.  Subjective: sleepy but participatory Assessment / Plan / Recommendation CHL IP CLINICAL IMPRESSIONS 06/02/2015 Therapy Diagnosis Mild oral phase dysphagia;Moderate pharyngeal phase dysphagia Clinical Impression Pt presents with a mild-moderate oropharyngeal dysphagia marked by prolonged oral preparation, delayed swallow initiation (particularly with thin liquids), vallecular and  pyriform residue (most of which clears with spontaneous f/u swallow).  There was silent aspiration noted with smaller boluses of thin liquid; moderate, sensed aspiration occurred with larger thin liquid boluses.  Nectar thick liquids were not observed to be aspirated nor did they penetrate the larynx.  Notable was audible swallow, not associated with deficits.  Pt's posture was ideal for study today, with head in neutral position.  Recommend initiating a dysphagia 3 diet with nectar-thick liquids; pt should be seated upright with head in neutral/flexed position - she should not eat with neck in extension.  Meds should be crushed and given with puree.  Discussed results/recs with pt and daughter.  SLP will follow for safety/toleration.  Impact on safety and function Moderate aspiration risk   CHL IP TREATMENT RECOMMENDATION 06/02/2015 Treatment Recommendations Therapy as outlined in treatment plan below   Prognosis 06/02/2015 Prognosis for Safe Diet Advancement Guarded  Barriers to Reach Goals Time post onset;Severity of deficits;Cognitive deficits;Other (Comment) Barriers/Prognosis Comment -- CHL IP DIET RECOMMENDATION 06/02/2015 SLP Diet Recommendations Dysphagia 3 (Mech soft) solids;Nectar thick liquid Liquid Administration via Cup Medication Administration Crushed with puree Compensations Slow rate;Small sips/bites;Clear throat intermittently Postural Changes Seated upright at 90 degrees   CHL IP OTHER RECOMMENDATIONS 06/02/2015 Recommended Consults -- Oral Care Recommendations Oral care BID Other Recommendations --   CHL IP FOLLOW UP RECOMMENDATIONS 06/02/2015 Follow up Recommendations (No Data)   CHL IP FREQUENCY AND DURATION 06/02/2015 Speech Therapy Frequency (ACUTE ONLY) min 2x/week Treatment Duration 1 week      CHL IP ORAL PHASE 06/02/2015 Oral Phase Impaired Oral - Pudding Teaspoon -- Oral - Pudding Cup -- Oral - Honey Teaspoon -- Oral - Honey Cup -- Oral - Nectar Teaspoon -- Oral - Nectar Cup -- Oral - Nectar Straw -- Oral - Thin Teaspoon -- Oral - Thin Cup -- Oral - Thin Straw -- Oral - Puree -- Oral - Mech Soft Delayed oral transit Oral - Regular -- Oral - Multi-Consistency -- Oral - Pill -- Oral Phase - Comment --  CHL IP PHARYNGEAL PHASE 06/02/2015 Pharyngeal Phase Impaired Pharyngeal- Pudding Teaspoon -- Pharyngeal -- Pharyngeal- Pudding Cup -- Pharyngeal -- Pharyngeal- Honey Teaspoon -- Pharyngeal -- Pharyngeal- Honey Cup -- Pharyngeal -- Pharyngeal- Nectar Teaspoon -- Pharyngeal -- Pharyngeal- Nectar Cup Delayed swallow initiation-pyriform sinuses;Reduced pharyngeal peristalsis;Reduced tongue base retraction;Pharyngeal residue - valleculae;Pharyngeal residue - pyriform Pharyngeal -- Pharyngeal- Nectar Straw -- Pharyngeal -- Pharyngeal- Thin Teaspoon -- Pharyngeal -- Pharyngeal- Thin Cup Delayed swallow initiation-pyriform sinuses;Reduced pharyngeal peristalsis;Reduced tongue base retraction;Pharyngeal residue - valleculae;Pharyngeal residue - pyriform;Penetration/Aspiration  during swallow;Moderate aspiration Pharyngeal Material enters airway, passes BELOW cords and not ejected out despite cough attempt by patient;Material enters airway, passes BELOW cords without attempt by patient to eject out (silent aspiration) Pharyngeal- Thin Straw -- Pharyngeal -- Pharyngeal- Puree Reduced pharyngeal peristalsis;Reduced tongue base retraction;Pharyngeal residue - valleculae;Pharyngeal residue - pyriform;Delayed swallow initiation-vallecula Pharyngeal -- Pharyngeal- Mechanical Soft Reduced pharyngeal peristalsis;Reduced tongue base retraction;Pharyngeal residue - valleculae;Pharyngeal residue - pyriform;Delayed swallow initiation-vallecula Pharyngeal -- Pharyngeal- Regular -- Pharyngeal -- Pharyngeal- Multi-consistency -- Pharyngeal -- Pharyngeal- Pill -- Pharyngeal -- Pharyngeal Comment --  No flowsheet data found. Juan Quam Laurice 06/02/2015, 2:30 PM              Dg C-arm 1-60 Min-no Report  05/31/2015  CLINICAL DATA: Surgery - Left  IM Nail C-ARM 1-60 MINUTES Fluoroscopy was utilized by the requesting physician.  No radiographic interpretation.   Dg Hip Operative Unilat With Pelvis Left  05/31/2015  CLINICAL  DATA:  LEFT femur fracture EXAM: OPERATIVE LEFT HIP (WITH PELVIS IF PERFORMED) 2 new VIEWS TECHNIQUE: Fluoroscopic spot image(s) were submitted for interpretation post-operatively. COMPARISON:  Radiograph 05/30/2015. FINDINGS: Insert medullary nail fixation of LEFT intertrochanteric fracture. Compression screw and distal fixation screw noted. IMPRESSION: No complication following intra medullary nail fixation of LEFT femur fracture Electronically Signed   By: Suzy Bouchard M.D.   On: 05/31/2015 16:58    ASSESSMENT/PLAN:  Generalized weakness - continue rehabilitation  Left hip fracture  S/P IM nailing  - continue rehabilitation; LLE WBAT; continue aspirin 325 mg 1 tab by mouth twice a day till 07/20/15; acetaminophen 500 mg 2 tabs by mouth 3 times a day and tramadol 50 mg  1 tab by mouth every 6 hours when necessary for pain; follow-up with orthopedics   Acute blood loss anemia - S/P transfusion of 1 unit packed RBC; hemoglobin 8.6; continue ferrous sulfate 325 mg 1 tab by mouth daily; check CBC   Dysphagia - continue mechanical soft diet with thin liquids; continue SLP treatments; aspiration precautions  Dementia - continue Aricept 10 mg 1 tab daily  Constipation - continue Colace 100 mg 1 capsule daily and MiraLAX 17 g daily when necessary  Protein calorie malnutrition, severe - albumin 2.68; start Procel 2 scoops by mouth twice a day; RD consult  Hypertension - well-controlled; continue lisinopril 5 mg daily; check BMP  Diabetes mellitus, type II - hemoglobin A1c 5.3; discontinue metformin 500 mg daily and continue CBG daily  Hyperlipidemia - continue atorvastatin 10 mg 1 tab daily    Goals of care:  Short-term rehabilitation    [facility], NP [facility] [phone]

## 2015-07-04 DIAGNOSIS — D508 Other iron deficiency anemias: Secondary | ICD-10-CM | POA: Diagnosis not present

## 2015-07-04 DIAGNOSIS — E08311 Diabetes mellitus due to underlying condition with unspecified diabetic retinopathy with macular edema: Secondary | ICD-10-CM | POA: Diagnosis not present

## 2015-07-04 LAB — CBC AND DIFFERENTIAL
HEMATOCRIT: 36 % (ref 36–46)
Hemoglobin: 10.9 g/dL — AB (ref 12.0–16.0)
NEUTROS ABS: 3 /uL
Platelets: 250 10*3/uL (ref 150–399)
WBC: 6.1 10*3/mL

## 2015-07-04 LAB — BASIC METABOLIC PANEL
BUN: 21 mg/dL (ref 4–21)
Creatinine: 0.7 mg/dL (ref 0.5–1.1)
GLUCOSE: 85 mg/dL
Potassium: 3.6 mmol/L (ref 3.4–5.3)
SODIUM: 147 mmol/L (ref 137–147)

## 2015-07-05 ENCOUNTER — Encounter: Payer: Self-pay | Admitting: Adult Health

## 2015-07-05 ENCOUNTER — Non-Acute Institutional Stay (SKILLED_NURSING_FACILITY): Payer: Medicare Other | Admitting: Adult Health

## 2015-07-05 DIAGNOSIS — R531 Weakness: Secondary | ICD-10-CM

## 2015-07-05 DIAGNOSIS — E43 Unspecified severe protein-calorie malnutrition: Secondary | ICD-10-CM

## 2015-07-05 DIAGNOSIS — E119 Type 2 diabetes mellitus without complications: Secondary | ICD-10-CM | POA: Diagnosis not present

## 2015-07-05 DIAGNOSIS — D62 Acute posthemorrhagic anemia: Secondary | ICD-10-CM

## 2015-07-05 DIAGNOSIS — E785 Hyperlipidemia, unspecified: Secondary | ICD-10-CM

## 2015-07-05 DIAGNOSIS — R1310 Dysphagia, unspecified: Secondary | ICD-10-CM

## 2015-07-05 DIAGNOSIS — I10 Essential (primary) hypertension: Secondary | ICD-10-CM | POA: Diagnosis not present

## 2015-07-05 DIAGNOSIS — S72142D Displaced intertrochanteric fracture of left femur, subsequent encounter for closed fracture with routine healing: Secondary | ICD-10-CM

## 2015-07-05 DIAGNOSIS — K5900 Constipation, unspecified: Secondary | ICD-10-CM

## 2015-07-05 DIAGNOSIS — F0390 Unspecified dementia without behavioral disturbance: Secondary | ICD-10-CM

## 2015-07-05 NOTE — Progress Notes (Signed)
Patient ID: Briana Thornton, female   DOB: 05-10-1927, 82 y.o.   MRN: [id number]     DATE:  07/05/15  MRN:  [id number]  BIRTHDAY: 29-Apr-1927  Facility:  [facility] Home Location:  [place] and [place] Room Number: 1206-P  LEVEL OF CARE:  SNF (31)  Contact Information    Name Relation Home Work [place] Daughter [phone]   Dorian Heckle [phone]     Coufal,Andrew Son   [phone]       Code Status History    Date Active Date Inactive Code Status Order ID Comments User Context   05/31/2015  9:01 PM 06/03/2015 10:28 PM DNR [id number]  Gardiner Barefoot, NP Inpatient   05/31/2015  6:38 PM 05/31/2015  9:01 PM Full Code [id number]  Cathlyn Parsons, PA-C Inpatient   05/31/2015  6:38 PM 05/31/2015  6:38 PM DNR [id number]  Rod Can, MD Inpatient   05/31/2015  9:58 AM 05/31/2015  6:38 PM DNR [id number]  Oswald Hillock, MD Inpatient   05/31/2015  2:09 AM 05/31/2015  9:58 AM Full Code [id number]  Norval Morton, MD Inpatient   05/18/2015  6:36 PM 05/28/2015  2:09 PM DNR [id number]  Cathlyn Parsons, PA-C Inpatient   05/16/2015  9:12 PM 05/18/2015  6:36 PM DNR [id number]  Edwin Dada, MD Inpatient   03/05/2015  5:39 PM 03/08/2015  6:51 PM DNR [id number]  Barton Dubois, MD Inpatient   07/24/2013  7:44 PM 07/27/2013  5:35 PM DNR [id number]  Gearlean Alf, MD Inpatient   07/24/2013  3:25 PM 07/24/2013  7:44 PM Full Code [id number]  Smiley, DO Inpatient    Questions for Most Recent Historical Code Status (Order [id number])    Question Answer Comment   In the event of cardiac or respiratory ARREST Do not call a "code blue"    In the event of cardiac or respiratory ARREST Do not perform Intubation, CPR, defibrillation or ACLS    In the event of cardiac or respiratory ARREST Use medication by any route, position, wound care, and other measures to relive pain and suffering. May use oxygen, suction and manual treatment of airway  obstruction as needed for comfort.     Advance Directive Documentation        Most Recent Value   Type of Advance Directive  Out of facility DNR (pink MOST or yellow form)   Pre-existing out of facility DNR order (yellow form or pink MOST form)     "MOST" Form in Place?         Chief Complaint  Patient presents with  . Discharge Note    HISTORY OF PRESENT ILLNESS:  This is an 82 year old female who is for discharge home with Home health PT for endurance. DME:  Semi- electric hospital bed with gel overlay. Latest hemoglobin A1c is 5.3 so metformin was discontinued. Latest hemoglobin is 10.9 and currently on ferrous sulfate 325 mg daily. She had transfusion of 1 unit packed RBC on 06/02/15 for acute blood loss anemia . She has been admitted to [facility] on 06/03/15 from [facility] with left hip intertrochanteric fracture or which she had repair with intramedullary fixation on 05/31/15. She was treated for UTI in the hospital and was discharged on ampicillin which was completed on 06/08/15.   Patient was admitted to this facility for short-term rehabilitation after the patient's recent hospitalization.  Patient has completed SNF rehabilitation and therapy has cleared the  patient for discharge.  PAST MEDICAL HISTORY:  Past Medical History  Diagnosis Date  . Chicken pox as a child  . Measles as a child  . IBS (irritable bowel syndrome)   . Diabetes mellitus 47    type 2  . Hyperlipidemia 72  . Hypertension 72  . Vision loss of right eye   . Vertigo 2010    benign  . Calcification of cartilage     ear  . Cardiovascular system disease 07/02/2011  . Hemorrhoid 07/02/2011  . Osteoporosis 07/02/2011  . Weight loss 07/31/2011  . Anemia 05/22/2012  . Broken wrist 10-22-11    left  . Depression 05/22/2012  . Dermatitis 05/22/2012    Right neck  . DM type 2 with diabetic peripheral neuropathy ([place]) 05/18/2015  . Unsteady gait   . Generalized weakness   . Closed intertrochanteric  fracture of left hip ([place])   . Enterococcus UTI   . Dysphagia   . Postoperative anemia due to acute blood loss   . Malnutrition of moderate degree ([place])   . Benign essential HTN   . HLD (hyperlipidemia)   . Constipation      CURRENT MEDICATIONS: Reviewed  Patient's Medications  New Prescriptions   No medications on file  Previous Medications   ACETAMINOPHEN (TYLENOL) 500 MG TABLET    Take 1,000 mg by mouth every 8 (eight) hours as needed.   ALUM & MAG HYDROXIDE-SIMETH (MAALOX ADVANCED) 200-200-20 MG/5ML SUSPENSION    Take 30 mLs by mouth every 6 (six) hours as needed for indigestion or heartburn.   ASPIRIN EC 325 MG TABLET    Take 325 mg by mouth 2 (two) times daily. For 30 days for prophylaxis.  Stop 07/21/15   ATORVASTATIN (LIPITOR) 10 MG TABLET    TAKE 1 TABLET (10 MG TOTAL) BY MOUTH DAILY.   DOCUSATE SODIUM (COLACE) 100 MG CAPSULE    Take 100 mg by mouth daily.    DONEPEZIL (ARICEPT) 10 MG TABLET    Take 1 tablet (10 mg total) by mouth at bedtime.   FERROUS SULFATE 325 (65 FE) MG TABLET    TAKE 1 TABLET (325 MG TOTAL) BY MOUTH DAILY WITH BREAKFAST.   IPRATROPIUM-ALBUTEROL (COMBIVENT RESPIMAT) 20-100 MCG/ACT AERS RESPIMAT    Inhale 1 puff into the lungs every 6 (six) hours as needed for wheezing or shortness of breath.   LISINOPRIL (PRINIVIL,ZESTRIL) 5 MG TABLET    TAKE 1 TABLET BY MOUTH EVERY DAY   METHYLCELLULOSE (ARTIFICIAL TEARS) 1 % OPHTHALMIC SOLUTION    Place 1 drop into both eyes daily as needed (dry eyes).    POLYETHYLENE GLYCOL (MIRALAX / GLYCOLAX) PACKET    Take 17 g by mouth daily as needed for mild constipation.   PROBIOTIC PRODUCT (PROBIOTIC DAILY PO)    Take 1 capsule by mouth daily.    PROTEIN (PROCEL) POWD    Take 2 scoop by mouth 2 (two) times daily.   TRAMADOL (ULTRAM) 50 MG TABLET    Take 1 tablet (50 mg total) by mouth every 6 (six) hours as needed for moderate pain.   UNABLE TO FIND    Take 120 mLs by mouth daily. Med Name: MedPass SF 120 mL PO QD  Modified  Medications   No medications on file  Discontinued Medications   ALUM & MAG HYDROXIDE-SIMETH (MAALOX/MYLANTA) 200-200-20 MG/5ML SUSPENSION    Take 30 mLs by mouth every 6 (six) hours as needed for indigestion or heartburn (dyspepsia).     No Known  Allergies   REVIEW OF SYSTEMS:  GENERAL: no change in appetite, no fatigue, no weight changes, no fever, chills or weakness EYES: Denies change in vision, dry eyes, eye pain, itching or discharge EARS: Denies change in hearing, ringing in ears, or earache NOSE: Denies nasal congestion or epistaxis MOUTH and THROAT: Denies oral discomfort, gingival pain or bleeding, pain from teeth or hoarseness   RESPIRATORY: no cough, SOB, DOE, wheezing, hemoptysis CARDIAC: no chest pain, edema or palpitations GI: no abdominal pain, diarrhea, constipation, heart burn, nausea or vomiting GU: Denies dysuria, frequency, hematuria, incontinence, or discharge PSYCHIATRIC: Denies feeling of depression or anxiety. No report of hallucinations, insomnia, paranoia, or agitation   PHYSICAL EXAMINATION  GENERAL APPEARANCE: Well nourished. In no acute distress. Normal body habitus SKIN: left lateral thigh incision is healed and left hip surgical incision is healed HEAD: Normal in size and contour. No evidence of trauma EYES: Lids open and close normally. No blepharitis, entropion or ectropion. PERRL. Conjunctivae are clear and sclerae are white. Lenses are without opacity EARS: Pinnae are normal. Patient hears normal voice tunes of the examiner MOUTH and THROAT: Lips are without lesions. Oral mucosa is moist and without lesions. Tongue is normal in shape, size, and color and without lesions NECK: supple, trachea midline, no neck masses, no thyroid tenderness, no thyromegaly LYMPHATICS: no LAN in the neck, no supraclavicular LAN RESPIRATORY: breathing is even & unlabored, BS CTAB CARDIAC: RRR, no murmur,no extra heart sounds, no edema; left chest  pacemaker EXTREMITIES:  Able to move 4 extremities  PSYCHIATRIC: Alert and oriented to person and place. Affect and behavior are appropriate  LABS/RADIOLOGY: Labs reviewed: 07/01/15  WBC 6.1 hemoglobin 10.9 hematocrit 35.7 platelet 250 sodium 140 7  K 3.6 glucose 85 BUN 21 creatinine 0.73  calcium 9.1 Basic Metabolic Panel:  Recent Labs  03/05/15 1857  05/30/15 2338 05/31/15 0002 06/01/15 0520 06/02/15 0443 06/09/15 07/04/15  NA  --   < > 147* 146* 146* 143 144 147  K  --   < > 4.0 3.9 4.5 4.1 3.7 3.6  CL  --   < > 108 108 111 112*  --   --   CO2  --   < > 29  --  26 24  --   --   GLUCOSE  --   < > 122* 113* 117* 94  --   --   BUN  --   < > 29* 32* 27* 31* 19 21  CREATININE 0.90  < > 0.84 0.90 0.97 0.95 0.8 0.7  CALCIUM  --   < > 9.5  --  8.8* 8.7*  --   --   MG 1.8  --   --   --   --   --   --   --   PHOS 2.6  --   --   --   --   --   --   --   < > = values in this interval not displayed. Liver Function Tests:  Recent Labs  05/16/15 1448 05/16/15 2210 05/19/15 0530 05/24/15 0530 06/09/15  AST 18 17 13*  --  12*  ALT 10* 10* 9*  --  4*  ALKPHOS 74 71 65  --  64  BILITOT 0.8 0.9 1.0  --   --   PROT 6.6 5.9* 5.3*  --   --   ALBUMIN 3.7 3.2* 2.9* 3.0*  --    CBC:  Recent Labs  05/30/15 2338  06/01/15  [id number] 06/02/15 0443 06/03/15 0525 06/09/15 07/04/15  WBC 9.4  --  8.1 11.5* 8.4 8.0 6.1  NEUTROABS 7.3  --   --   --   --  5 3  HGB 11.6*  < > 8.3* 7.4* 8.5* 8.6* 10.9*  HCT 38.0  < > 27.4* 24.2* 26.8* 28* 36  MCV 93.8  --  93.8 95.3 92.1  --   --   PLT 283  --  192 177 197 337 250  < > = values in this interval not displayed.  Lipid Panel:  Recent Labs  10/21/14 1105 05/17/15 0822 06/20/15  HDL 74.60 65 48   CBG:  Recent Labs  05/24/15 1413 05/31/15 1716 06/01/15 0507  GLUCAP 172* 74 104*      No results found.  ASSESSMENT/PLAN:  Generalized weakness - for Home health PT  Left hip fracture  S/P IM nailing  - for Home health PT for  endurance training; LLE WBAT; continue aspirin 325 mg 1 tab by mouth twice a day till 07/20/15; acetaminophen 500 mg 2 tabs by mouth 3 times a day and tramadol 50 mg 1 tab by mouth every 6 hours when necessary for pain; follow-up with orthopedics   Acute blood loss anemia - S/P transfusion of 1 unit packed RBC; hemoglobin 8.6; continue ferrous sulfate 325 mg 1 tab by mouth daily; re-check hgb 10.9  Dysphagia - continue mechanical soft diet with thin liquids; aspiration precautions  Dementia - continue Aricept 10 mg 1 tab daily  Constipation - continue Colace 100 mg 1 capsule daily and MiraLAX 17 g daily when necessary  Protein calorie malnutrition, severe - albumin 2.68; continue Procel 2 scoops by mouth twice a day  Hypertension - well-controlled; continue lisinopril 5 mg daily  Diabetes mellitus, type II - hemoglobin A1c 5.3; recently discontinued Metformin  Hyperlipidemia - continue atorvastatin 10 mg 1 tab daily      I have filled out patient's discharge paperwork and written prescriptions.  Patient will receive home health PT.  DME provided:  Semi- electric hospital bed with gel overlay  Total discharge time: Greater than 30 minutes  Discharge time involved coordination of the discharge process with social worker, nursing staff and therapy department. Medical justification for home health services/DME verified.     [facility], NP [employer] [phone]

## 2015-07-26 ENCOUNTER — Ambulatory Visit (INDEPENDENT_AMBULATORY_CARE_PROVIDER_SITE_OTHER): Payer: Medicare Other | Admitting: Family Medicine

## 2015-07-26 ENCOUNTER — Encounter: Payer: Self-pay | Admitting: Family Medicine

## 2015-07-26 VITALS — BP 174/66 | HR 63 | Wt 110.2 lb

## 2015-07-26 DIAGNOSIS — R350 Frequency of micturition: Secondary | ICD-10-CM

## 2015-07-26 DIAGNOSIS — R82998 Other abnormal findings in urine: Secondary | ICD-10-CM

## 2015-07-26 DIAGNOSIS — I10 Essential (primary) hypertension: Secondary | ICD-10-CM

## 2015-07-26 DIAGNOSIS — N390 Urinary tract infection, site not specified: Secondary | ICD-10-CM | POA: Diagnosis not present

## 2015-07-26 LAB — POC URINALSYSI DIPSTICK (AUTOMATED)
Bilirubin, UA: NEGATIVE
Blood, UA: NEGATIVE
GLUCOSE UA: NEGATIVE
Ketones, UA: NEGATIVE
Nitrite, UA: NEGATIVE
UROBILINOGEN UA: 2
pH, UA: 6

## 2015-07-26 MED ORDER — CIPROFLOXACIN HCL 250 MG PO TABS: 250.0000 mg | ORAL_TABLET | Freq: Two times a day (BID) | ORAL | Status: DC

## 2015-07-26 MED ORDER — LISINOPRIL 10 MG PO TABS: 10.0000 mg | ORAL_TABLET | Freq: Every day | ORAL | Status: DC

## 2015-07-26 NOTE — Patient Instructions (Signed)
Hypertension Hypertension, commonly called high blood pressure, is when the force of blood pumping through your arteries is too strong. Your arteries are the blood vessels that carry blood from your heart throughout your body. A blood pressure reading consists of a higher number over a lower number, such as 110/72. The higher number (systolic) is the pressure inside your arteries when your heart pumps. The lower number (diastolic) is the pressure inside your arteries when your heart relaxes. Ideally you want your blood pressure below 120/80. Hypertension forces your heart to work harder to pump blood. Your arteries may become narrow or stiff. Having untreated or uncontrolled hypertension can cause heart attack, stroke, kidney disease, and other problems. RISK FACTORS Some risk factors for high blood pressure are controllable. Others are not.  Risk factors you cannot control include:   Race. You may be at higher risk if you are African American.  Age. Risk increases with age.  Gender. Men are at higher risk than women before age 45 years. After age 65, women are at higher risk than men. Risk factors you can control include:  Not getting enough exercise or physical activity.  Being overweight.  Getting too much fat, sugar, calories, or salt in your diet.  Drinking too much alcohol. SIGNS AND SYMPTOMS Hypertension does not usually cause signs or symptoms. Extremely high blood pressure (hypertensive crisis) may cause headache, anxiety, shortness of breath, and nosebleed. DIAGNOSIS To check if you have hypertension, your health care provider will measure your blood pressure while you are seated, with your arm held at the level of your heart. It should be measured at least twice using the same arm. Certain conditions can cause a difference in blood pressure between your right and left arms. A blood pressure reading that is higher than normal on one occasion does not mean that you need treatment. If  it is not clear whether you have high blood pressure, you may be asked to return on a different day to have your blood pressure checked again. Or, you may be asked to monitor your blood pressure at home for 1 or more weeks. TREATMENT Treating high blood pressure includes making lifestyle changes and possibly taking medicine. Living a healthy lifestyle can help lower high blood pressure. You may need to change some of your habits. Lifestyle changes may include:  Following the DASH diet. This diet is high in fruits, vegetables, and whole grains. It is low in salt, red meat, and added sugars.  Keep your sodium intake below 2,300 mg per day.  Getting at least 30-45 minutes of aerobic exercise at least 4 times per week.  Losing weight if necessary.  Not smoking.  Limiting alcoholic beverages.  Learning ways to reduce stress. Your health care provider may prescribe medicine if lifestyle changes are not enough to get your blood pressure under control, and if one of the following is true:  You are 18-59 years of age and your systolic blood pressure is above 140.  You are 60 years of age or older, and your systolic blood pressure is above 150.  Your diastolic blood pressure is above 90.  You have diabetes, and your systolic blood pressure is over 140 or your diastolic blood pressure is over 90.  You have kidney disease and your blood pressure is above 140/90.  You have heart disease and your blood pressure is above 140/90. Your personal target blood pressure may vary depending on your medical conditions, your age, and other factors. HOME CARE INSTRUCTIONS    Have your blood pressure rechecked as directed by your health care provider.   Take medicines only as directed by your health care provider. Follow the directions carefully. Blood pressure medicines must be taken as prescribed. The medicine does not work as well when you skip doses. Skipping doses also puts you at risk for  problems.  Do not smoke.   Monitor your blood pressure at home as directed by your health care provider. SEEK MEDICAL CARE IF:   You think you are having a reaction to medicines taken.  You have recurrent headaches or feel dizzy.  You have swelling in your ankles.  You have trouble with your vision. SEEK IMMEDIATE MEDICAL CARE IF:  You develop a severe headache or confusion.  You have unusual weakness, numbness, or feel faint.  You have severe chest or abdominal pain.  You vomit repeatedly.  You have trouble breathing. MAKE SURE YOU:   Understand these instructions.  Will watch your condition.  Will get help right away if you are not doing well or get worse.   This information is not intended to replace advice given to you by your health care provider. Make sure you discuss any questions you have with your health care provider.   Document Released: 05/14/2005 Document Revised: 09/28/2014 Document Reviewed: 03/06/2013 Elsevier Interactive Patient Education 2016 [employer]  

## 2015-07-26 NOTE — Progress Notes (Signed)
Patient ID: Briana Thornton, female    DOB: 1926/08/25  Age: 82 y.o. MRN: [id number]    Subjective:  Subjective HPI Briana Thornton presents for f/u htn.  She is here with her daughter.   Review of Systems  Constitutional: Negative for diaphoresis, appetite change, fatigue and unexpected weight change.  Eyes: Negative for pain, redness and visual disturbance.  Respiratory: Negative for cough, chest tightness, shortness of breath and wheezing.   Cardiovascular: Negative for chest pain, palpitations and leg swelling.  Endocrine: Negative for cold intolerance, heat intolerance, polydipsia, polyphagia and polyuria.  Genitourinary: Negative for dysuria, frequency and difficulty urinating.  Neurological: Negative for dizziness, light-headedness, numbness and headaches.    History Past Medical History  Diagnosis Date  . Chicken pox as a child  . Measles as a child  . IBS (irritable bowel syndrome)   . Diabetes mellitus 47    type 2  . Hyperlipidemia 72  . Hypertension 72  . Vision loss of right eye   . Vertigo 2010    benign  . Calcification of cartilage     ear  . Cardiovascular system disease 07/02/2011  . Hemorrhoid 07/02/2011  . Osteoporosis 07/02/2011  . Weight loss 07/31/2011  . Anemia 05/22/2012  . Broken wrist 10-22-11    left  . Depression 05/22/2012  . Dermatitis 05/22/2012    Right neck  . DM type 2 with diabetic peripheral neuropathy ([place]) 05/18/2015  . Unsteady gait   . Generalized weakness   . Closed intertrochanteric fracture of left hip ([place])   . Enterococcus UTI   . Dysphagia   . Postoperative anemia due to acute blood loss   . Malnutrition of moderate degree ([place])   . Benign essential HTN   . HLD (hyperlipidemia)   . Constipation     She has past surgical history that includes Hemorroidectomy (1979); knee scoped (2002); Rotator cuff repair (2004); Abdominal hysterectomy (2008); lens implant left (2006); Pacemaker insertion (2010); Wrist surgery (11-05-11);  Intramedullary (im) nail intertrochanteric (Right, 07/24/2013); and Intramedullary (im) nail intertrochanteric (Left, 05/31/2015).   Her family history includes Cancer in her brother and maternal grandfather; Diabetes in her daughter, mother, and son; Hypertension in her mother.She reports that she has never smoked. She has never used smokeless tobacco. She reports that she does not drink alcohol or use illicit drugs.  Current Outpatient Prescriptions on File Prior to Visit  Medication Sig Dispense Refill  . acetaminophen (TYLENOL) 500 MG tablet Take 1,000 mg by mouth every 8 (eight) hours as needed.    Marland Kitchen alum & mag hydroxide-simeth (MAALOX ADVANCED) 200-200-20 MG/5ML suspension Take 30 mLs by mouth every 6 (six) hours as needed for indigestion or heartburn.    Marland Kitchen aspirin EC 325 MG tablet Take 325 mg by mouth daily. For 30 days for prophylaxis.  Stop 07/21/15    . atorvastatin (LIPITOR) 10 MG tablet TAKE 1 TABLET (10 MG TOTAL) BY MOUTH DAILY. 90 tablet 3  . docusate sodium (COLACE) 100 MG capsule Take 100 mg by mouth daily.     Marland Kitchen donepezil (ARICEPT) 10 MG tablet Take 1 tablet (10 mg total) by mouth at bedtime. 90 tablet 3  . ferrous sulfate 325 (65 FE) MG tablet TAKE 1 TABLET (325 MG TOTAL) BY MOUTH DAILY WITH BREAKFAST. 30 tablet 0  . Ipratropium-Albuterol (COMBIVENT RESPIMAT) 20-100 MCG/ACT AERS respimat Inhale 1 puff into the lungs every 6 (six) hours as needed for wheezing or shortness of breath.    . methylcellulose (ARTIFICIAL TEARS) 1 %  ophthalmic solution Place 1 drop into both eyes daily as needed (dry eyes).     . polyethylene glycol (MIRALAX / GLYCOLAX) packet Take 17 g by mouth daily as needed for mild constipation. 14 each 0  . Probiotic Product (PROBIOTIC DAILY PO) Take 1 capsule by mouth daily.     . Protein (PROCEL) POWD Take 2 scoop by mouth 2 (two) times daily.    . traMADol (ULTRAM) 50 MG tablet Take 1 tablet (50 mg total) by mouth every 6 (six) hours as needed for moderate pain. 80  tablet 0  . UNABLE TO FIND Take 120 mLs by mouth daily. Med Name: MedPass SF 120 mL PO QD     No current facility-administered medications on file prior to visit.     Objective:  Objective Physical Exam  Constitutional: She is oriented to person, place, and time. She appears well-developed and well-nourished.  HENT:  Head: Normocephalic and atraumatic.  Eyes: Conjunctivae and EOM are normal.  Neck: Normal range of motion. Neck supple. No JVD present. Carotid bruit is not present. No thyromegaly present.  Cardiovascular: Normal rate, regular rhythm and normal heart sounds.   No murmur heard. Pulmonary/Chest: Effort normal and breath sounds normal. No respiratory distress. She has no wheezes. She has no rales. She exhibits no tenderness.  Musculoskeletal: She exhibits no edema.  Neurological: She is alert and oriented to person, place, and time.  Psychiatric: She has a normal mood and affect.  Nursing note and vitals reviewed.  BP 174/66 mmHg  Pulse 63  Wt 110 lb 3.2 oz (49.986 kg)  SpO2 94% Wt Readings from Last 3 Encounters:  07/26/15 110 lb 3.2 oz (49.986 kg)  07/05/15 115 lb (52.164 kg)  06/30/15 115 lb 2 oz (52.22 kg)     Lab Results  Component Value Date   WBC 6.1 07/04/2015   HGB 10.9* 07/04/2015   HCT 36 07/04/2015   PLT 250 07/04/2015   GLUCOSE 94 06/02/2015   CHOL 103 06/20/2015   TRIG 67 06/20/2015   HDL 48 06/20/2015   LDLDIRECT 121.9 07/02/2011   LDLCALC 42 06/20/2015   ALT 4* 06/09/2015   AST 12* 06/09/2015   NA 147 07/04/2015   K 3.6 07/04/2015   CL 112* 06/02/2015   CREATININE 0.7 07/04/2015   BUN 21 07/04/2015   CO2 24 06/02/2015   TSH 2.08 04/26/2015   INR 1.13 05/31/2015   HGBA1C 5.3 06/17/2015   MICROALBUR 0.50 07/02/2011    Dg Chest 1 View  05/31/2015  CLINICAL DATA:  Preoperative chest radiograph for left femoral fracture. Initial encounter. EXAM: CHEST 1 VIEW COMPARISON:  Chest radiograph performed 05/16/2015 FINDINGS: The lungs are  mildly hypoexpanded. No focal consolidation, pleural effusion or pneumothorax is seen. The cardiomediastinal silhouette is borderline enlarged. A pacemaker is noted overlying the left chest wall, with leads ending overlying the right atrium and right ventricle. Dense calcification is suggested at the mitral valve. No acute osseous abnormalities are identified. IMPRESSION: 1. Lungs mildly hypoexpanded but grossly clear. 2. Borderline cardiomegaly. Dense calcification suggested at the mitral valve. Electronically Signed   By: Garald Balding M.D.   On: 05/31/2015 01:18   Ct Head Wo Contrast  05/31/2015  CLINICAL DATA:  Status post fall while going from bedroom to bathroom. Concern for head injury. Initial encounter. EXAM: CT HEAD WITHOUT CONTRAST TECHNIQUE: Contiguous axial images were obtained from the base of the skull through the vertex without intravenous contrast. COMPARISON:  CT of the head performed 05/18/2015  FINDINGS: There is no evidence of acute infarction, or intra- or extra-axial hemorrhage on CT. Moderate cortical volume loss is noted, with prominence of the ventricles and sulci. Cerebellar atrophy is seen. Relatively diffuse periventricular and subcortical white matter change likely reflects small vessel ischemic microangiopathy. Chronic lacunar infarcts are seen at the basal ganglia bilaterally. A chronic lacunar infarct is also noted at the left side of the pons. A 2.0 cm densely calcified meningioma is again noted along the inferior aspect of the anterior falx cerebri, grossly stable in appearance. The brainstem and fourth ventricle are within normal limits. The basal ganglia are unremarkable in appearance. The cerebral hemispheres demonstrate grossly normal gray-white differentiation. No mass effect or midline shift is seen. There is no evidence of fracture; visualized osseous structures are unremarkable in appearance. The orbits are within normal limits. The paranasal sinuses and mastoid air cells  are well-aerated. No significant soft tissue abnormalities are seen. IMPRESSION: 1. No acute intracranial pathology seen on CT. 2. Moderate cortical volume loss and diffuse small vessel ischemic microangiopathy. 3. Chronic lacunar infarcts at the basal ganglia bilaterally, and chronic lacunar infarct at the left side of the pons. 4. 2.0 cm densely calcified meningioma again noted along the inferior aspect of the anterior falx cerebri. Electronically Signed   By: Garald Balding M.D.   On: 05/31/2015 01:09   Pelvis Portable  05/31/2015  CLINICAL DATA:  82 year old female with a history of left hip open reduction internal fixation. EXAM: PORTABLE PELVIS 1-2 VIEWS COMPARISON:  05/31/2015 FINDINGS: Early postop changes of left femoral neck fixation with antegrade intra medullary rod placement with left femoral neck gamma rod and single distal interlocking screw. Alignment relatively maintained at the fracture site at the left femoral neck. Subcutaneous gas within the proximal left thigh. Diffuse osteopenia, with postoperative changes of prior right femoral neck fixation again noted. Surgical catheter projects over the midline. Dense atherosclerotic calcifications of the femoral popliteal vasculature. IMPRESSION: Early postoperative changes of left femoral neck fixation, as above. Atherosclerosis. Signed, Dulcy Fanny. Earleen Newport, DO [facility] Electronically Signed   By: Corrie Mckusick D.O.   On: 05/31/2015 18:04   Dg C-arm 1-60 Min-no Report  05/31/2015  CLINICAL DATA: Surgery - Left  IM Nail C-ARM 1-60 MINUTES Fluoroscopy was utilized by the requesting physician.  No radiographic interpretation.   Dg Hip Operative Unilat With Pelvis Left  05/31/2015  CLINICAL DATA:  LEFT femur fracture EXAM: OPERATIVE LEFT HIP (WITH PELVIS IF PERFORMED) 2 new VIEWS TECHNIQUE: Fluoroscopic spot image(s) were submitted for interpretation post-operatively. COMPARISON:  Radiograph  05/30/2015. FINDINGS: Insert medullary nail fixation of LEFT intertrochanteric fracture. Compression screw and distal fixation screw noted. IMPRESSION: No complication following intra medullary nail fixation of LEFT femur fracture Electronically Signed   By: Suzy Bouchard M.D.   On: 05/31/2015 16:58   Dg Hip Unilat With Pelvis 2-3 Views Left  05/30/2015  CLINICAL DATA:  Status post fall, landing on left hip, with left hip shortening and pain. Initial encounter. EXAM: DG HIP (WITH OR WITHOUT PELVIS) 2-3V LEFT COMPARISON:  None. FINDINGS: There is a minimally displaced slightly comminuted left femoral intertrochanteric fracture, with mild lateral displacement of the distal femur. The left femoral head remains seated at the acetabulum. The right hip joint is unremarkable in appearance. Right femoral hardware is grossly unremarkable, though incompletely imaged. Mild degenerative change is noted at the sacroiliac joints. Diffuse vascular calcifications are seen. The visualized bowel gas pattern is grossly unremarkable. A  prominent sclerotic focus is noted at the right hemipelvis, benign in appearance. IMPRESSION: 1. Minimally displaced slightly comminuted left femoral intertrochanteric fracture, with mild lateral displacement of the distal femur. 2. Diffuse vascular calcifications seen. Electronically Signed   By: Garald Balding M.D.   On: 05/30/2015 23:33   Dg Femur Min 2 Views Left  05/31/2015  CLINICAL DATA:  Left hip pain after fall.  Initial encounter. EXAM: LEFT FEMUR 2 VIEWS COMPARISON:  None. FINDINGS: There is a mildly displaced relatively proximal left femoral intertrochanteric fracture, with sparing of the lesser femoral trochanter and mild lateral displacement. The left femoral head remains seated at the acetabulum. Diffuse vascular calcifications are seen. The knee joint is grossly unremarkable. No knee joint effusion is identified. IMPRESSION: 1. Mildly displaced relatively proximal left femoral  intertrochanteric fracture, with sparing of the lesser femoral trochanter and mild lateral displacement. 2. Diffuse vascular calcifications seen. Electronically Signed   By: Garald Balding M.D.   On: 05/31/2015 01:20     Assessment & Plan:  Plan I have discontinued Ms. Nierman's lisinopril. I am also having her start on lisinopril and ciprofloxacin. Additionally, I am having her maintain her Probiotic Product (PROBIOTIC DAILY PO), methylcellulose, polyethylene glycol, docusate sodium, Ipratropium-Albuterol, atorvastatin, donepezil, traMADol, ferrous sulfate, aspirin EC, acetaminophen, UNABLE TO FIND, PROCEL, and alum & mag hydroxide-simeth.  Meds ordered this encounter  Medications  . lisinopril (PRINIVIL,ZESTRIL) 10 MG tablet    Sig: Take 1 tablet (10 mg total) by mouth daily.    Dispense:  90 tablet    Refill:  3  . ciprofloxacin (CIPRO) 250 MG tablet    Sig: Take 1 tablet (250 mg total) by mouth 2 (two) times daily.    Dispense:  6 tablet    Refill:  0    Problem List Items Addressed This Visit      Unprioritized   Essential hypertension - Primary    Inc lisinopril to 10  mg      Relevant Medications   lisinopril (PRINIVIL,ZESTRIL) 10 MG tablet    Other Visit Diagnoses    Urinary frequency        Relevant Medications    ciprofloxacin (CIPRO) 250 MG tablet    Other Relevant Orders    POCT Urinalysis Dipstick (Automated) (Completed)    Urine Culture    Leukocytes in urine        Relevant Orders    Urine Culture       Follow-up: Return in about 3 months (around 10/23/2015), or if symptoms worsen or fail to improve, for hypertension.  Garnet Koyanagi, DO

## 2015-07-26 NOTE — Progress Notes (Signed)
Pre visit review using our clinic review tool, if applicable. No additional management support is needed unless otherwise documented below in the visit note. 

## 2015-07-26 NOTE — Assessment & Plan Note (Signed)
Inc lisinopril to 10  mg

## 2015-07-27 LAB — URINE CULTURE

## 2015-07-28 ENCOUNTER — Ambulatory Visit: Payer: Medicare Other | Admitting: Family Medicine

## 2015-08-01 ENCOUNTER — Other Ambulatory Visit: Payer: Self-pay | Admitting: Physical Medicine and Rehabilitation

## 2015-08-02 ENCOUNTER — Other Ambulatory Visit: Payer: Self-pay | Admitting: Family Medicine

## 2015-09-25 ENCOUNTER — Encounter (HOSPITAL_COMMUNITY): Payer: Self-pay | Admitting: Emergency Medicine

## 2015-09-25 ENCOUNTER — Emergency Department (HOSPITAL_COMMUNITY)
Admission: EM | Admit: 2015-09-25 | Discharge: 2015-09-26 | Disposition: A | Discharge status: Home / Self Care | Payer: Medicare Other | Attending: Emergency Medicine | Admitting: Emergency Medicine

## 2015-09-25 ENCOUNTER — Emergency Department (HOSPITAL_COMMUNITY): Payer: Medicare Other

## 2015-09-25 DIAGNOSIS — Z8781 Personal history of (healed) traumatic fracture: Secondary | ICD-10-CM | POA: Diagnosis not present

## 2015-09-25 DIAGNOSIS — Z87828 Personal history of other (healed) physical injury and trauma: Secondary | ICD-10-CM | POA: Insufficient documentation

## 2015-09-25 DIAGNOSIS — K5900 Constipation, unspecified: Secondary | ICD-10-CM | POA: Insufficient documentation

## 2015-09-25 DIAGNOSIS — E785 Hyperlipidemia, unspecified: Secondary | ICD-10-CM | POA: Diagnosis not present

## 2015-09-25 DIAGNOSIS — F329 Major depressive disorder, single episode, unspecified: Secondary | ICD-10-CM | POA: Insufficient documentation

## 2015-09-25 DIAGNOSIS — J209 Acute bronchitis, unspecified: Secondary | ICD-10-CM | POA: Diagnosis not present

## 2015-09-25 DIAGNOSIS — Z872 Personal history of diseases of the skin and subcutaneous tissue: Secondary | ICD-10-CM | POA: Diagnosis not present

## 2015-09-25 DIAGNOSIS — E1142 Type 2 diabetes mellitus with diabetic polyneuropathy: Secondary | ICD-10-CM | POA: Insufficient documentation

## 2015-09-25 DIAGNOSIS — Z8669 Personal history of other diseases of the nervous system and sense organs: Secondary | ICD-10-CM | POA: Diagnosis not present

## 2015-09-25 DIAGNOSIS — Z87440 Personal history of urinary (tract) infections: Secondary | ICD-10-CM | POA: Diagnosis not present

## 2015-09-25 DIAGNOSIS — R531 Weakness: Secondary | ICD-10-CM

## 2015-09-25 DIAGNOSIS — I10 Essential (primary) hypertension: Secondary | ICD-10-CM | POA: Diagnosis not present

## 2015-09-25 DIAGNOSIS — J40 Bronchitis, not specified as acute or chronic: Secondary | ICD-10-CM

## 2015-09-25 DIAGNOSIS — Z8619 Personal history of other infectious and parasitic diseases: Secondary | ICD-10-CM | POA: Diagnosis not present

## 2015-09-25 DIAGNOSIS — D649 Anemia, unspecified: Secondary | ICD-10-CM | POA: Diagnosis not present

## 2015-09-25 DIAGNOSIS — Z79899 Other long term (current) drug therapy: Secondary | ICD-10-CM | POA: Diagnosis not present

## 2015-09-25 DIAGNOSIS — E860 Dehydration: Secondary | ICD-10-CM | POA: Diagnosis not present

## 2015-09-25 DIAGNOSIS — Z950 Presence of cardiac pacemaker: Secondary | ICD-10-CM | POA: Diagnosis not present

## 2015-09-25 DIAGNOSIS — R05 Cough: Secondary | ICD-10-CM | POA: Diagnosis present

## 2015-09-25 LAB — COMPREHENSIVE METABOLIC PANEL
ALK PHOS: 63 U/L (ref 38–126)
ALT: 14 U/L (ref 14–54)
ANION GAP: 9 (ref 5–15)
AST: 18 U/L (ref 15–41)
Albumin: 3.6 g/dL (ref 3.5–5.0)
BILIRUBIN TOTAL: 1 mg/dL (ref 0.3–1.2)
BUN: 37 mg/dL — ABNORMAL HIGH (ref 6–20)
CALCIUM: 9.3 mg/dL (ref 8.9–10.3)
CO2: 25 mmol/L (ref 22–32)
CREATININE: 1.07 mg/dL — AB (ref 0.44–1.00)
Chloride: 110 mmol/L (ref 101–111)
GFR calc non Af Amer: 45 mL/min — ABNORMAL LOW (ref >60)
GFR, EST AFRICAN AMERICAN: 53 mL/min — AB (ref >60)
GLUCOSE: 114 mg/dL — AB (ref 65–99)
Potassium: 3.9 mmol/L (ref 3.5–5.1)
SODIUM: 144 mmol/L (ref 135–145)
TOTAL PROTEIN: 6.5 g/dL (ref 6.5–8.1)

## 2015-09-25 LAB — CBC WITH DIFFERENTIAL/PLATELET
Basophils Absolute: 0 10*3/uL (ref 0.0–0.1)
Basophils Relative: 0 %
Eosinophils Absolute: 0.2 10*3/uL (ref 0.0–0.7)
Eosinophils Relative: 2 %
HEMATOCRIT: 38.1 % (ref 36.0–46.0)
HEMOGLOBIN: 12 g/dL (ref 12.0–15.0)
Lymphocytes Relative: 15 %
Lymphs Abs: 1.2 10*3/uL (ref 0.7–4.0)
MCH: 28.8 pg (ref 26.0–34.0)
MCHC: 31.5 g/dL (ref 30.0–36.0)
MCV: 91.4 fL (ref 78.0–100.0)
MONO ABS: 0.5 10*3/uL (ref 0.1–1.0)
MONOS PCT: 7 %
Neutro Abs: 6 10*3/uL (ref 1.7–7.7)
Neutrophils Relative %: 76 %
Platelets: 191 10*3/uL (ref 150–400)
RBC: 4.17 MIL/uL (ref 3.87–5.11)
RDW: 14.2 % (ref 11.5–15.5)
WBC: 8 10*3/uL (ref 4.0–10.5)

## 2015-09-25 LAB — TROPONIN I: Troponin I: <0.03 ng/mL (ref <0.031)

## 2015-09-25 LAB — URINE MICROSCOPIC-ADD ON: Squamous Epithelial / LPF: NONE SEEN

## 2015-09-25 LAB — URINALYSIS, ROUTINE W REFLEX MICROSCOPIC
BILIRUBIN URINE: NEGATIVE
GLUCOSE, UA: NEGATIVE mg/dL
HGB URINE DIPSTICK: NEGATIVE
Ketones, ur: NEGATIVE mg/dL
Leukocytes, UA: NEGATIVE
Nitrite: NEGATIVE
Protein, ur: 30 mg/dL — AB
SPECIFIC GRAVITY, URINE: 1.031 — AB (ref 1.005–1.030)
pH: 5.5 (ref 5.0–8.0)

## 2015-09-25 LAB — LACTIC ACID, PLASMA: LACTIC ACID, VENOUS: 1.1 mmol/L (ref 0.5–2.0)

## 2015-09-25 MED ORDER — SODIUM CHLORIDE 0.9 % IV SOLN
1000.0000 mL | Freq: Once | INTRAVENOUS | Status: AC
  Administered 2015-09-25: 1000 mL via INTRAVENOUS

## 2015-09-25 MED ORDER — SODIUM CHLORIDE 0.9 % IV SOLN
1000.0000 mL | INTRAVENOUS | Status: DC
  Administered 2015-09-25: 1000 mL via INTRAVENOUS

## 2015-09-25 NOTE — ED Notes (Signed)
Patient with cough and congestion, worsening over the past couple days. Family reports patient was increasingly confused today and weak. Patient lives at home with her family where they are her caregivers, daughter is a [employer] scientist, states she had similar symptoms a few months ago with pneumonia and code sepsis. Patient with decreased PO intake.

## 2015-09-25 NOTE — ED Provider Notes (Signed)
CSN: [id number]     Arrival date & time 09/25/15  1951 History   First MD Initiated Contact with Patient 09/25/15 2019     Chief Complaint  Patient presents with  . Cough  . Weakness     HPI Patient presents to the emergency department with complaints of generalized weakness as well as cough and congestion.  Cough and congestions and worsening over the past several days and family noted the patient was slightly confused and generally weak today without focal deficit.  The patient's daughter is a nurse here at the hospital.  Patient does have a history of intermittent urinary tract infections.  No fell odor noted to the urine.  Patient is without complaints of nausea vomiting or diarrhea.  No abdominal pain.   Past Medical History  Diagnosis Date  . Chicken pox as a child  . Measles as a child  . IBS (irritable bowel syndrome)   . Diabetes mellitus 47    type 2  . Hyperlipidemia 72  . Hypertension 72  . Vision loss of right eye   . Vertigo 2010    benign  . Calcification of cartilage     ear  . Cardiovascular system disease 07/02/2011  . Hemorrhoid 07/02/2011  . Osteoporosis 07/02/2011  . Weight loss 07/31/2011  . Anemia 05/22/2012  . Broken wrist 10-22-11    left  . Depression 05/22/2012  . Dermatitis 05/22/2012    Right neck  . DM type 2 with diabetic peripheral neuropathy ([place]) 05/18/2015  . Unsteady gait   . Generalized weakness   . Closed intertrochanteric fracture of left hip ([place])   . Enterococcus UTI   . Dysphagia   . Postoperative anemia due to acute blood loss   . Malnutrition of moderate degree ([place])   . Benign essential HTN   . HLD (hyperlipidemia)   . Constipation    Past Surgical History  Procedure Laterality Date  . Hemorroidectomy  1979  . Knee scoped  2002    right  . Rotator cuff repair  2004    right  . Abdominal hysterectomy  2008    partial still has ovaries  . Lens implant left  2006  . Pacemaker insertion  2010  . Wrist surgery  11-05-11   left wrist  . Intramedullary (im) nail intertrochanteric Right 07/24/2013    Procedure: INTRAMEDULLARY (IM) NAIL INTERTROCHANTRIC;  Surgeon: Gearlean Alf, MD;  Location: [facility];  Service: Orthopedics;  Laterality: Right;  . Intramedullary (im) nail intertrochanteric Left 05/31/2015    Procedure: INTRAMEDULLARY (IM) NAIL INTERTROCHANTRIC;  Surgeon: Rod Can, MD;  Location: [facility];  Service: Orthopedics;  Laterality: Left;   Family History  Problem Relation Age of Onset  . Diabetes Mother     type 2  . Hypertension Mother   . Cancer Brother     lung-smoker  . Diabetes Daughter     pre diabetic  . Diabetes Son     type 2  . Cancer Maternal Grandfather     prostate   Social History  Substance Use Topics  . Smoking status: Never Smoker   . Smokeless tobacco: Never Used  . Alcohol Use: No   OB History    No data available     Review of Systems  All other systems reviewed and are negative.     Allergies  Review of patient's allergies indicates no known allergies.  Home Medications   Prior to Admission medications   Medication Sig Start Date  End Date Taking? Authorizing Provider  acetaminophen (TYLENOL) 500 MG tablet Take 1,000 mg by mouth every 8 (eight) hours as needed for mild pain, moderate pain or headache.    Yes Historical Provider, MD  alum & mag hydroxide-simeth (MAALOX ADVANCED) 200-200-20 MG/5ML suspension Take 30 mLs by mouth every 6 (six) hours as needed for indigestion or heartburn.   Yes Historical Provider, MD  aspirin EC 325 MG tablet Take 325 mg by mouth daily. For 30 days for prophylaxis.  Stop 07/21/15   Yes Historical Provider, MD  atorvastatin (LIPITOR) 10 MG tablet TAKE 1 TABLET (10 MG TOTAL) BY MOUTH DAILY. 04/26/15  Yes Mosie Lukes, MD  docusate sodium (COLACE) 100 MG capsule Take 100 mg by mouth daily.    Yes Historical Provider, MD  donepezil (ARICEPT) 10 MG tablet Take 1 tablet (10 mg total) by mouth at bedtime. 04/26/15  Yes Mosie Lukes,  MD  ferrous sulfate 325 (65 FE) MG tablet TAKE 1 TABLET (325 MG TOTAL) BY MOUTH DAILY WITH BREAKFAST. Patient taking differently: TAKE 1 TABLET (325 MG TOTAL) BY MOUTH EVERY OTHER DAY WITH BREAKFAST. 08/02/15  Yes Mosie Lukes, MD  Ipratropium-Albuterol (COMBIVENT RESPIMAT) 20-100 MCG/ACT AERS respimat Inhale 1 puff into the lungs every 6 (six) hours as needed for wheezing or shortness of breath. 03/08/15  Yes Barton Dubois, MD  lisinopril (PRINIVIL,ZESTRIL) 10 MG tablet Take 1 tablet (10 mg total) by mouth daily. 07/26/15  Yes Yvonne R Lowne Chase, DO  methylcellulose (ARTIFICIAL TEARS) 1 % ophthalmic solution Place 1 drop into both eyes daily as needed (dry eyes).    Yes Historical Provider, MD  polyethylene glycol (MIRALAX / GLYCOLAX) packet Take 17 g by mouth daily as needed for mild constipation. 07/27/13  Yes Kelvin Cellar, MD  Probiotic Product (PROBIOTIC DAILY PO) Take 1 capsule by mouth daily.    Yes Historical Provider, MD  UNABLE TO FIND Take 120 mLs by mouth daily. Med Name: MedPass SF 120 mL PO QD   Yes Historical Provider, MD  ciprofloxacin (CIPRO) 250 MG tablet Take 1 tablet (250 mg total) by mouth 2 (two) times daily. Patient not taking: Reported on 09/25/2015 07/26/15   Rosalita Chessman Chase, DO  traMADol (ULTRAM) 50 MG tablet Take 1 tablet (50 mg total) by mouth every 6 (six) hours as needed for moderate pain. Patient not taking: Reported on 09/25/2015 06/01/15   Rod Can, MD   BP 165/48 mmHg  Pulse 69  Temp(Src) 99.8 F (37.7 C) (Rectal)  Resp 16  SpO2 96% Physical Exam  Constitutional: She is oriented to person, place, and time. She appears well-developed and well-nourished. No distress.  HENT:  Head: Normocephalic and atraumatic.  Eyes: EOM are normal.  Neck: Normal range of motion.  Cardiovascular: Normal rate, regular rhythm and normal heart sounds.   Pulmonary/Chest: Effort normal and breath sounds normal.  Abdominal: Soft. She exhibits no distension. There is no  tenderness.  Musculoskeletal: Normal range of motion.  Neurological: She is alert and oriented to person, place, and time.  Skin: Skin is warm and dry.  Psychiatric: She has a normal mood and affect. Judgment normal.  Nursing note and vitals reviewed.   ED Course  Procedures (including critical care time) Labs Review Labs Reviewed  COMPREHENSIVE METABOLIC PANEL - Abnormal; Notable for the following:    Glucose, Bld 114 (*)    BUN 37 (*)    Creatinine, Ser 1.07 (*)    GFR calc non Af Amer 45 (*)  GFR calc Af Amer 53 (*)    All other components within normal limits  URINALYSIS, ROUTINE W REFLEX MICROSCOPIC (NOT AT [facility]) - Abnormal; Notable for the following:    APPearance CLOUDY (*)    Specific Gravity, Urine 1.031 (*)    Protein, ur 30 (*)    All other components within normal limits  URINE MICROSCOPIC-ADD ON - Abnormal; Notable for the following:    Bacteria, UA FEW (*)    All other components within normal limits  CBC WITH DIFFERENTIAL/PLATELET  TROPONIN I  LACTIC ACID, PLASMA    Imaging Review Dg Chest 2 View  09/25/2015  CLINICAL DATA:  Worsening dyspnea for several days EXAM: CHEST  2 VIEW COMPARISON:  05/31/2015 FINDINGS: There is unchanged mild elevation of the left hemidiaphragm. Unchanged mild cardiomegaly. Densely calcified mitral annulus. Grossly intact appearances of the dual-lumen transvenous cardiac leads. No airspace consolidation. No effusion. Normal pulmonary vasculature. IMPRESSION: Unchanged cardiomegaly.  No acute cardiopulmonary findings. Electronically Signed   By: Andreas Newport M.D.   On: 09/25/2015 21:11   I have personally reviewed and evaluated these images and lab results as part of my medical decision-making.   EKG Interpretation None      MDM   Final diagnoses:  None    Overall well-appearing.  Mild elevation in her BUN and creatinine which feels much better after IV fluids.  Generalized weakness likely secondary to mild dehydration.   She's had mild decreased oral intake.  In regards to her productive coughing congestion as well despite a x-ray which is negative for pneumonia today she'll be treated for possible early developing community acquired pneumonia as the radiograph always lacks the clinical picture.  Family is in agreement with discharge home at this time.  They'll continue keep a close eye on her.  Home with amoxicillin and azithromycin.  Instructions to return to the ER for new or worsening symptoms    Jola Schmidt, MD 09/26/15 0028

## 2015-09-26 MED ORDER — AMOXICILLIN 500 MG PO CAPS
1000.0000 mg | ORAL_CAPSULE | Freq: Once | ORAL | Status: AC
  Administered 2015-09-26: 1000 mg via ORAL
  Filled 2015-09-26: qty 2

## 2015-09-26 MED ORDER — AZITHROMYCIN 250 MG PO TABS: 250.0000 mg | ORAL_TABLET | Freq: Every day | ORAL | Status: DC

## 2015-09-26 MED ORDER — AZITHROMYCIN 250 MG PO TABS
500.0000 mg | ORAL_TABLET | Freq: Once | ORAL | Status: AC
  Administered 2015-09-26: 500 mg via ORAL
  Filled 2015-09-26: qty 2

## 2015-09-26 MED ORDER — AMOXICILLIN 500 MG PO CAPS: 1000.0000 mg | ORAL_CAPSULE | Freq: Two times a day (BID) | ORAL | Status: DC

## 2015-09-26 MED FILL — AZITHROMYCIN 250 MG TABLET: 250 | 4 days supply | Qty: 4 | Fill #0

## 2015-09-26 MED FILL — AMOXICILLIN 500 MG CAPSULE: 500 | 7 days supply | Qty: 28 | Fill #0

## 2015-09-26 NOTE — Discharge Instructions (Signed)
Dehydration, Adult °Dehydration is a condition in which you do not have enough fluid or water in your body. It happens when you take in less fluid than you lose. Vital organs such as the kidneys, brain, and heart cannot function without a proper amount of fluids. Any loss of fluids from the body can cause dehydration.  °Dehydration can range from mild to severe. This condition should be treated right away to help prevent it from becoming severe. °CAUSES  °This condition may be caused by: °· Vomiting. °· Diarrhea. °· Excessive sweating, such as when exercising in hot or humid weather. °· Not drinking enough fluid during strenuous exercise or during an illness. °· Excessive urine output. °· Fever. °· Certain medicines. °RISK FACTORS °This condition is more likely to develop in: °· People who are taking certain medicines that cause the body to lose excess fluid (diuretics).   °· People who have a chronic illness, such as diabetes, that may increase urination. °· Older adults.   °· People who live at high altitudes.   °· People who participate in endurance sports.   °SYMPTOMS  °Mild Dehydration °· Thirst. °· Dry lips. °· Slightly dry mouth. °· Dry, warm skin. °Moderate Dehydration °· Very dry mouth.   °· Muscle cramps.   °· Dark urine and decreased urine production.   °· Decreased tear production.   °· Headache.   °· Light-headedness, especially when you stand up from a sitting position.   °Severe Dehydration °· Changes in skin.   °¨ Cold and clammy skin.   °¨ Skin does not spring back quickly when lightly pinched and released.   °· Changes in body fluids.   °¨ Extreme thirst.   °¨ No tears.   °¨ Not able to sweat when body temperature is high, such as in hot weather.   °¨ Minimal urine production.   °· Changes in vital signs.   °¨ Rapid, weak pulse (more than 100 beats per minute when you are sitting still).   °¨ Rapid breathing.   °¨ Low blood pressure.   °· Other changes.   °¨ Sunken eyes.   °¨ Cold hands and feet.    °¨ Confusion. °¨ Lethargy and difficulty being awakened. °¨ Fainting (syncope).   °¨ Short-term weight loss.   °¨ Unconsciousness. °DIAGNOSIS  °This condition may be diagnosed based on your symptoms. You may also have tests to determine how severe your dehydration is. These tests may include:  °· Urine tests.   °· Blood tests.   °TREATMENT  °Treatment for this condition depends on the severity. Mild or moderate dehydration can often be treated at home. Treatment should be started right away. Do not wait until dehydration becomes severe. Severe dehydration needs to be treated at the hospital. °Treatment for Mild Dehydration °· Drinking plenty of water to replace the fluid you have lost.   °· Replacing minerals in your blood (electrolytes) that you may have lost.   °Treatment for Moderate Dehydration  °· Consuming oral rehydration solution (ORS). °Treatment for Severe Dehydration °· Receiving fluid through an IV tube.   °· Receiving electrolyte solution through a feeding tube that is passed through your nose and into your stomach (nasogastric tube or NG tube). °· Correcting any abnormalities in electrolytes. °HOME CARE INSTRUCTIONS  °· Drink enough fluid to keep your urine clear or pale yellow.   °· Drink water or fluid slowly by taking small sips. You can also try sucking on ice cubes.  °· Have food or beverages that contain electrolytes. Examples include bananas and sports drinks. °· Take over-the-counter and prescription medicines only as told by your health care provider.   °· Prepare ORS according to the manufacturer's instructions. Take sips   of ORS every 5 minutes until your urine returns to normal.  If you have vomiting or diarrhea, continue to try to drink water, ORS, or both.   If you have diarrhea, avoid:   Beverages that contain caffeine.   Fruit juice.   Milk.   Carbonated soft drinks.  Do not take salt tablets. This can lead to the condition of having too much sodium in your body  (hypernatremia).  SEEK MEDICAL CARE IF:  You cannot eat or drink without vomiting.  You have had moderate diarrhea during a period of more than 24 hours.  You have a fever. SEEK IMMEDIATE MEDICAL CARE IF:   You have extreme thirst.  You have severe diarrhea.  You have not urinated in 6-8 hours, or you have urinated only a small amount of very dark urine.  You have shriveled skin.  You are dizzy, confused, or both.   This information is not intended to replace advice given to you by your health care provider. Make sure you discuss any questions you have with your health care provider.   Document Released: 05/14/2005 Document Revised: 02/02/2015 Document Reviewed: 09/29/2014 Elsevier Interactive Patient Education 2016 [employer] Acute Bronchitis Bronchitis is inflammation of the airways that extend from the windpipe into the lungs (bronchi). The inflammation often causes mucus to develop. This leads to a cough, which is the most common symptom of bronchitis.  In acute bronchitis, the condition usually develops suddenly and goes away over time, usually in a couple weeks. Smoking, allergies, and asthma can make bronchitis worse. Repeated episodes of bronchitis may cause further lung problems.  CAUSES Acute bronchitis is most often caused by the same virus that causes a cold. The virus can spread from person to person (contagious) through coughing, sneezing, and touching contaminated objects. SIGNS AND SYMPTOMS   Cough.   Fever.   Coughing up mucus.   Body aches.   Chest congestion.   Chills.   Shortness of breath.   Sore throat.  DIAGNOSIS  Acute bronchitis is usually diagnosed through a physical exam. Your health care provider will also ask you questions about your medical history. Tests, such as chest X-rays, are sometimes done to rule out other conditions.  TREATMENT  Acute bronchitis usually goes away in a couple weeks. Oftentimes, no medical treatment is  necessary. Medicines are sometimes given for relief of fever or cough. Antibiotic medicines are usually not needed but may be prescribed in certain situations. In some cases, an inhaler may be recommended to help reduce shortness of breath and control the cough. A cool mist vaporizer may also be used to help thin bronchial secretions and make it easier to clear the chest.  HOME CARE INSTRUCTIONS  Get plenty of rest.   Drink enough fluids to keep your urine clear or pale yellow (unless you have a medical condition that requires fluid restriction). Increasing fluids may help thin your respiratory secretions (sputum) and reduce chest congestion, and it will prevent dehydration.   Take medicines only as directed by your health care provider.  If you were prescribed an antibiotic medicine, finish it all even if you start to feel better.  Avoid smoking and secondhand smoke. Exposure to cigarette smoke or irritating chemicals will make bronchitis worse. If you are a smoker, consider using nicotine gum or skin patches to help control withdrawal symptoms. Quitting smoking will help your lungs heal faster.   Reduce the chances of another bout of acute bronchitis by washing your hands frequently, avoiding  people with cold symptoms, and trying not to touch your hands to your mouth, nose, or eyes.   Keep all follow-up visits as directed by your health care provider.  SEEK MEDICAL CARE IF: Your symptoms do not improve after 1 week of treatment.  SEEK IMMEDIATE MEDICAL CARE IF:  You develop an increased fever or chills.   You have chest pain.   You have severe shortness of breath.  You have bloody sputum.   You develop dehydration.  You faint or repeatedly feel like you are going to pass out.  You develop repeated vomiting.  You develop a severe headache. MAKE SURE YOU:   Understand these instructions.  Will watch your condition.  Will get help right away if you are not doing well  or get worse.   This information is not intended to replace advice given to you by your health care provider. Make sure you discuss any questions you have with your health care provider.   Document Released: 06/21/2004 Document Revised: 06/04/2014 Document Reviewed: 11/04/2012 Elsevier Interactive Patient Education [employer].

## 2015-09-30 ENCOUNTER — Emergency Department (HOSPITAL_COMMUNITY): Payer: Medicare Other

## 2015-09-30 ENCOUNTER — Inpatient Hospital Stay (HOSPITAL_COMMUNITY)
Admission: EM | Admit: 2015-09-30 | Discharge: 2015-10-03 | DRG: 202 | Disposition: A | Discharge status: Skilled Nursing Facility | Payer: Medicare Other | Attending: Internal Medicine | Admitting: Internal Medicine

## 2015-09-30 ENCOUNTER — Encounter (HOSPITAL_COMMUNITY): Payer: Self-pay | Admitting: Emergency Medicine

## 2015-09-30 DIAGNOSIS — H5461 Unqualified visual loss, right eye, normal vision left eye: Secondary | ICD-10-CM | POA: Diagnosis present

## 2015-09-30 DIAGNOSIS — E1142 Type 2 diabetes mellitus with diabetic polyneuropathy: Secondary | ICD-10-CM | POA: Diagnosis present

## 2015-09-30 DIAGNOSIS — D649 Anemia, unspecified: Secondary | ICD-10-CM | POA: Diagnosis present

## 2015-09-30 DIAGNOSIS — I442 Atrioventricular block, complete: Secondary | ICD-10-CM | POA: Diagnosis present

## 2015-09-30 DIAGNOSIS — B9781 Human metapneumovirus as the cause of diseases classified elsewhere: Secondary | ICD-10-CM | POA: Diagnosis present

## 2015-09-30 DIAGNOSIS — R0600 Dyspnea, unspecified: Secondary | ICD-10-CM

## 2015-09-30 DIAGNOSIS — R609 Edema, unspecified: Secondary | ICD-10-CM

## 2015-09-30 DIAGNOSIS — Z833 Family history of diabetes mellitus: Secondary | ICD-10-CM

## 2015-09-30 DIAGNOSIS — R05 Cough: Secondary | ICD-10-CM | POA: Diagnosis not present

## 2015-09-30 DIAGNOSIS — Z7982 Long term (current) use of aspirin: Secondary | ICD-10-CM

## 2015-09-30 DIAGNOSIS — J690 Pneumonitis due to inhalation of food and vomit: Secondary | ICD-10-CM

## 2015-09-30 DIAGNOSIS — Z8673 Personal history of transient ischemic attack (TIA), and cerebral infarction without residual deficits: Secondary | ICD-10-CM

## 2015-09-30 DIAGNOSIS — Z66 Do not resuscitate: Secondary | ICD-10-CM | POA: Diagnosis present

## 2015-09-30 DIAGNOSIS — J9601 Acute respiratory failure with hypoxia: Secondary | ICD-10-CM | POA: Diagnosis present

## 2015-09-30 DIAGNOSIS — Z90710 Acquired absence of both cervix and uterus: Secondary | ICD-10-CM

## 2015-09-30 DIAGNOSIS — R1310 Dysphagia, unspecified: Secondary | ICD-10-CM | POA: Diagnosis present

## 2015-09-30 DIAGNOSIS — Z8249 Family history of ischemic heart disease and other diseases of the circulatory system: Secondary | ICD-10-CM | POA: Diagnosis not present

## 2015-09-30 DIAGNOSIS — Z950 Presence of cardiac pacemaker: Secondary | ICD-10-CM | POA: Diagnosis not present

## 2015-09-30 DIAGNOSIS — E785 Hyperlipidemia, unspecified: Secondary | ICD-10-CM | POA: Diagnosis present

## 2015-09-30 DIAGNOSIS — F0390 Unspecified dementia without behavioral disturbance: Secondary | ICD-10-CM | POA: Diagnosis present

## 2015-09-30 DIAGNOSIS — K589 Irritable bowel syndrome without diarrhea: Secondary | ICD-10-CM | POA: Diagnosis present

## 2015-09-30 DIAGNOSIS — I10 Essential (primary) hypertension: Secondary | ICD-10-CM

## 2015-09-30 DIAGNOSIS — J208 Acute bronchitis due to other specified organisms: Principal | ICD-10-CM | POA: Diagnosis present

## 2015-09-30 DIAGNOSIS — N390 Urinary tract infection, site not specified: Secondary | ICD-10-CM | POA: Diagnosis present

## 2015-09-30 DIAGNOSIS — M810 Age-related osteoporosis without current pathological fracture: Secondary | ICD-10-CM | POA: Diagnosis present

## 2015-09-30 DIAGNOSIS — R0902 Hypoxemia: Secondary | ICD-10-CM

## 2015-09-30 DIAGNOSIS — R627 Adult failure to thrive: Secondary | ICD-10-CM | POA: Diagnosis present

## 2015-09-30 DIAGNOSIS — Z79899 Other long term (current) drug therapy: Secondary | ICD-10-CM

## 2015-09-30 DIAGNOSIS — E119 Type 2 diabetes mellitus without complications: Secondary | ICD-10-CM

## 2015-09-30 LAB — BASIC METABOLIC PANEL
Anion gap: 9 (ref 5–15)
BUN: 32 mg/dL — ABNORMAL HIGH (ref 6–20)
CO2: 26 mmol/L (ref 22–32)
Calcium: 9 mg/dL (ref 8.9–10.3)
Chloride: 110 mmol/L (ref 101–111)
Creatinine, Ser: 0.95 mg/dL (ref 0.44–1.00)
GFR calc Af Amer: >60 mL/min (ref >60)
GFR calc non Af Amer: 52 mL/min — ABNORMAL LOW (ref >60)
Glucose, Bld: 106 mg/dL — ABNORMAL HIGH (ref 65–99)
Potassium: 4.3 mmol/L (ref 3.5–5.1)
Sodium: 145 mmol/L (ref 135–145)

## 2015-09-30 LAB — CBC WITH DIFFERENTIAL/PLATELET
Basophils Absolute: 0 10*3/uL (ref 0.0–0.1)
Basophils Relative: 0 %
Eosinophils Absolute: 0.1 10*3/uL (ref 0.0–0.7)
Eosinophils Relative: 1 %
HCT: 37.3 % (ref 36.0–46.0)
Hemoglobin: 12.3 g/dL (ref 12.0–15.0)
Lymphocytes Relative: 17 %
Lymphs Abs: 2 10*3/uL (ref 0.7–4.0)
MCH: 28.7 pg (ref 26.0–34.0)
MCHC: 33 g/dL (ref 30.0–36.0)
MCV: 86.9 fL (ref 78.0–100.0)
Monocytes Absolute: 0.7 10*3/uL (ref 0.1–1.0)
Monocytes Relative: 6 %
Neutro Abs: 8.8 10*3/uL — ABNORMAL HIGH (ref 1.7–7.7)
Neutrophils Relative %: 76 %
Platelets: 236 10*3/uL (ref 150–400)
RBC: 4.29 MIL/uL (ref 3.87–5.11)
RDW: 13.8 % (ref 11.5–15.5)
WBC: 11.6 10*3/uL — ABNORMAL HIGH (ref 4.0–10.5)

## 2015-09-30 LAB — BRAIN NATRIURETIC PEPTIDE: B Natriuretic Peptide: 121.5 pg/mL — ABNORMAL HIGH (ref 0.0–100.0)

## 2015-09-30 LAB — URINALYSIS, ROUTINE W REFLEX MICROSCOPIC
Glucose, UA: NEGATIVE mg/dL
Ketones, ur: NEGATIVE mg/dL
Nitrite: NEGATIVE
Protein, ur: 30 mg/dL — AB
Specific Gravity, Urine: 1.027 (ref 1.005–1.030)
pH: 6 (ref 5.0–8.0)

## 2015-09-30 LAB — URINE MICROSCOPIC-ADD ON

## 2015-09-30 LAB — TROPONIN I: Troponin I: <0.03 ng/mL (ref <0.031)

## 2015-09-30 MED ORDER — IPRATROPIUM-ALBUTEROL 0.5-2.5 (3) MG/3ML IN SOLN
3.0000 mL | Freq: Four times a day (QID) | RESPIRATORY_TRACT | Status: DC
  Administered 2015-09-30: 3 mL via RESPIRATORY_TRACT
  Filled 2015-09-30: qty 3

## 2015-09-30 MED ORDER — TECHNETIUM TC 99M DIETHYLENETRIAME-PENTAACETIC ACID: 31.5000 | Freq: Once | INTRAVENOUS | Status: DC | PRN

## 2015-09-30 MED ORDER — PROBIOTIC DAILY PO CAPS: ORAL_CAPSULE | Freq: Every day | ORAL | Status: DC

## 2015-09-30 MED ORDER — OXYMETAZOLINE HCL 0.05 % NA SOLN
1.0000 | Freq: Two times a day (BID) | NASAL | Status: DC
  Administered 2015-09-30 – 2015-10-03 (×4): 1 via NASAL
  Filled 2015-09-30: qty 15

## 2015-09-30 MED ORDER — SODIUM CHLORIDE 0.9 % IV BOLUS (SEPSIS)
500.0000 mL | Freq: Once | INTRAVENOUS | Status: AC
  Administered 2015-09-30: 500 mL via INTRAVENOUS

## 2015-09-30 MED ORDER — SODIUM CHLORIDE 0.9 % IV SOLN
INTRAVENOUS | Status: DC
  Administered 2015-10-01: 07:00:00 via INTRAVENOUS

## 2015-09-30 MED ORDER — DONEPEZIL HCL 10 MG PO TABS
10.0000 mg | ORAL_TABLET | Freq: Every day | ORAL | Status: DC
  Administered 2015-09-30 – 2015-10-02 (×3): 10 mg via ORAL
  Filled 2015-09-30 (×4): qty 1

## 2015-09-30 MED ORDER — ACETAMINOPHEN 500 MG PO TABS: 1000.0000 mg | ORAL_TABLET | Freq: Three times a day (TID) | ORAL | Status: DC | PRN

## 2015-09-30 MED ORDER — POLYVINYL ALCOHOL 1.4 % OP SOLN
1.0000 [drp] | Freq: Every day | OPHTHALMIC | Status: DC | PRN
  Filled 2015-09-30: qty 15

## 2015-09-30 MED ORDER — ALUM & MAG HYDROXIDE-SIMETH 200-200-20 MG/5ML PO SUSP: 30.0000 mL | Freq: Four times a day (QID) | ORAL | Status: DC | PRN

## 2015-09-30 MED ORDER — ATORVASTATIN CALCIUM 10 MG PO TABS
10.0000 mg | ORAL_TABLET | Freq: Every day | ORAL | Status: DC
  Administered 2015-10-01 – 2015-10-02 (×2): 10 mg via ORAL
  Filled 2015-09-30 (×3): qty 1

## 2015-09-30 MED ORDER — TECHNETIUM TO 99M ALBUMIN AGGREGATED
4.3000 | Freq: Once | INTRAVENOUS | Status: AC | PRN
  Administered 2015-09-30: 4 via INTRAVENOUS

## 2015-09-30 MED ORDER — FLUTICASONE PROPIONATE 50 MCG/ACT NA SUSP
2.0000 | Freq: Every day | NASAL | Status: DC
  Administered 2015-09-30 – 2015-10-03 (×3): 2 via NASAL
  Filled 2015-09-30: qty 16

## 2015-09-30 MED ORDER — LORATADINE 10 MG PO TABS
10.0000 mg | ORAL_TABLET | Freq: Every day | ORAL | Status: DC
  Administered 2015-09-30 – 2015-10-03 (×4): 10 mg via ORAL
  Filled 2015-09-30 (×4): qty 1

## 2015-09-30 MED ORDER — SACCHAROMYCES BOULARDII 250 MG PO CAPS
250.0000 mg | ORAL_CAPSULE | Freq: Two times a day (BID) | ORAL | Status: DC
  Administered 2015-09-30 – 2015-10-03 (×6): 250 mg via ORAL
  Filled 2015-09-30 (×7): qty 1

## 2015-09-30 MED ORDER — ALBUTEROL SULFATE (2.5 MG/3ML) 0.083% IN NEBU: 2.5000 mg | INHALATION_SOLUTION | RESPIRATORY_TRACT | Status: DC | PRN

## 2015-09-30 MED ORDER — FERROUS SULFATE 325 (65 FE) MG PO TABS
325.0000 mg | ORAL_TABLET | Freq: Every day | ORAL | Status: DC
  Administered 2015-10-01 – 2015-10-03 (×3): 325 mg via ORAL
  Filled 2015-09-30 (×4): qty 1

## 2015-09-30 MED ORDER — IPRATROPIUM-ALBUTEROL 0.5-2.5 (3) MG/3ML IN SOLN
3.0000 mL | Freq: Two times a day (BID) | RESPIRATORY_TRACT | Status: DC
  Administered 2015-10-01 – 2015-10-02 (×2): 3 mL via RESPIRATORY_TRACT
  Filled 2015-09-30 (×3): qty 3

## 2015-09-30 MED ORDER — METHYLCELLULOSE 1 % OP SOLN: 1.0000 [drp] | Freq: Every day | OPHTHALMIC | Status: DC | PRN

## 2015-09-30 MED ORDER — ONDANSETRON HCL 4 MG PO TABS: 4.0000 mg | ORAL_TABLET | Freq: Four times a day (QID) | ORAL | Status: DC | PRN

## 2015-09-30 MED ORDER — ASPIRIN EC 325 MG PO TBEC
325.0000 mg | DELAYED_RELEASE_TABLET | Freq: Every day | ORAL | Status: DC
  Administered 2015-09-30 – 2015-10-03 (×4): 325 mg via ORAL
  Filled 2015-09-30 (×4): qty 1

## 2015-09-30 MED ORDER — ENOXAPARIN SODIUM 40 MG/0.4ML ~~LOC~~ SOLN
40.0000 mg | SUBCUTANEOUS | Status: DC
  Administered 2015-10-02: 40 mg via SUBCUTANEOUS
  Filled 2015-09-30 (×4): qty 0.4

## 2015-09-30 MED ORDER — BOOST PO LIQD
237.0000 mL | Freq: Every day | ORAL | Status: DC
  Administered 2015-10-01 – 2015-10-02 (×2): 237 mL via ORAL
  Filled 2015-09-30 (×3): qty 237

## 2015-09-30 MED ORDER — LISINOPRIL 10 MG PO TABS
10.0000 mg | ORAL_TABLET | Freq: Every day | ORAL | Status: DC
  Administered 2015-09-30 – 2015-10-03 (×4): 10 mg via ORAL
  Filled 2015-09-30 (×4): qty 1

## 2015-09-30 MED ORDER — ALBUTEROL SULFATE (2.5 MG/3ML) 0.083% IN NEBU
5.0000 mg | INHALATION_SOLUTION | Freq: Once | RESPIRATORY_TRACT | Status: DC
  Filled 2015-09-30: qty 6

## 2015-09-30 MED ORDER — ONDANSETRON HCL 4 MG/2ML IJ SOLN: 4.0000 mg | Freq: Four times a day (QID) | INTRAMUSCULAR | Status: DC | PRN

## 2015-09-30 MED ORDER — BISACODYL 10 MG RE SUPP: 10.0000 mg | Freq: Every day | RECTAL | Status: DC | PRN

## 2015-09-30 MED ORDER — SODIUM CHLORIDE 0.9 % IV SOLN
3.0000 g | Freq: Three times a day (TID) | INTRAVENOUS | Status: DC
  Administered 2015-09-30 – 2015-10-02 (×6): 3 g via INTRAVENOUS
  Filled 2015-09-30 (×6): qty 3

## 2015-09-30 MED ORDER — POLYETHYLENE GLYCOL 3350 17 G PO PACK: 17.0000 g | PACK | Freq: Every day | ORAL | Status: DC | PRN

## 2015-09-30 NOTE — ED Provider Notes (Signed)
CSN: [id number]     Arrival date & time 09/30/15  [id number] History   First MD Initiated Contact with Patient 09/30/15 [id number]     Chief Complaint  Patient presents with  . Cough  . Weakness     (Consider location/radiation/quality/duration/timing/severity/associated sxs/prior Treatment) HPI   82 y.o. female with medical history significant of dementia, history of complete heart block status post permanent pacemaker placement, hypertension, failure to thrive syndrome-who presents to the ED with approximately one-week history of cough and nasal congestion. Please note, patient is a poor historian, her daughter who is a Therapist, sports provides much of the history. Apparently, multiple members of the family had a upper respiratory tract infection and are currently taking antibiotics. Patient started having similar symptoms approximately a week back. Her symptoms mostly consist cough that is occasionally productive. She also has nasal and sinus congestion. Along with these symptoms, she started experiencing worsening generalized weakness (at baseline, patient needs assistance to walk-and only gets around the room). She was recently seen in the emergency room a few days back, and empirically started on amoxicillin and Zithromax, however her symptoms continued to worsen. This morning, she was found to be incredibly weak and her lips were noted to be cyanotic. A pulse oximeter at home showed her O2 saturation to be in the mid 70s.. As a result she was brought to the emergency room for further evaluation and treatment  Past Medical History  Diagnosis Date  . Chicken pox as a child  . Measles as a child  . IBS (irritable bowel syndrome)   . Diabetes mellitus 47    type 2  . Hyperlipidemia 72  . Hypertension 72  . Vision loss of right eye   . Vertigo 2010    benign  . Calcification of cartilage     ear  . Cardiovascular system disease 07/02/2011  . Hemorrhoid 07/02/2011  . Osteoporosis 07/02/2011  . Weight loss 07/31/2011   . Anemia 05/22/2012  . Broken wrist 10-22-11    left  . Depression 05/22/2012  . Dermatitis 05/22/2012    Right neck  . DM type 2 with diabetic peripheral neuropathy ([place]) 05/18/2015  . Unsteady gait   . Generalized weakness   . Closed intertrochanteric fracture of left hip ([place])   . Enterococcus UTI   . Dysphagia   . Postoperative anemia due to acute blood loss   . Malnutrition of moderate degree ([place])   . Benign essential HTN   . HLD (hyperlipidemia)   . Constipation    Past Surgical History  Procedure Laterality Date  . Hemorroidectomy  1979  . Knee scoped  2002    right  . Rotator cuff repair  2004    right  . Abdominal hysterectomy  2008    partial still has ovaries  . Lens implant left  2006  . Pacemaker insertion  2010  . Wrist surgery  11-05-11    left wrist  . Intramedullary (im) nail intertrochanteric Right 07/24/2013    Procedure: INTRAMEDULLARY (IM) NAIL INTERTROCHANTRIC;  Surgeon: Gearlean Alf, MD;  Location: [facility];  Service: Orthopedics;  Laterality: Right;  . Intramedullary (im) nail intertrochanteric Left 05/31/2015    Procedure: INTRAMEDULLARY (IM) NAIL INTERTROCHANTRIC;  Surgeon: Rod Can, MD;  Location: [facility];  Service: Orthopedics;  Laterality: Left;   Family History  Problem Relation Age of Onset  . Diabetes Mother     type 2  . Hypertension Mother   . Cancer Brother  lung-smoker  . Diabetes Daughter     pre diabetic  . Diabetes Son     type 2  . Cancer Maternal Grandfather     prostate   Social History  Substance Use Topics  . Smoking status: Never Smoker   . Smokeless tobacco: Never Used  . Alcohol Use: No   OB History    No data available     Review of Systems    Allergies  Review of patient's allergies indicates no known allergies.  Home Medications   Prior to Admission medications   Medication Sig Start Date End Date Taking? Authorizing Provider  acetaminophen (TYLENOL) 500 MG tablet Take 1,000 mg by mouth  every 8 (eight) hours as needed for mild pain, moderate pain or headache.    Yes Historical Provider, MD  alum & mag hydroxide-simeth (MAALOX ADVANCED) 200-200-20 MG/5ML suspension Take 30 mLs by mouth every 6 (six) hours as needed for indigestion or heartburn.   Yes Historical Provider, MD  amoxicillin (AMOXIL) 500 MG capsule Take 2 capsules (1,000 mg total) by mouth 2 (two) times daily. Patient taking differently: Take 1,000 mg by mouth 2 (two) times daily. Started 05/01 for 7 days 09/26/15  Yes Jola Schmidt, MD  aspirin EC 325 MG tablet Take 325 mg by mouth daily. For 30 days for prophylaxis.  Stop 07/21/15   Yes Historical Provider, MD  atorvastatin (LIPITOR) 10 MG tablet TAKE 1 TABLET (10 MG TOTAL) BY MOUTH DAILY. 04/26/15  Yes Mosie Lukes, MD  azithromycin (ZITHROMAX) 250 MG tablet Take 1 tablet (250 mg total) by mouth daily. 09/26/15  Yes Jola Schmidt, MD  docusate sodium (COLACE) 100 MG capsule Take 100 mg by mouth daily.    Yes Historical Provider, MD  donepezil (ARICEPT) 10 MG tablet Take 1 tablet (10 mg total) by mouth at bedtime. 04/26/15  Yes Mosie Lukes, MD  ferrous sulfate 325 (65 FE) MG tablet TAKE 1 TABLET (325 MG TOTAL) BY MOUTH DAILY WITH BREAKFAST. Patient taking differently: TAKE 1 TABLET (325 MG TOTAL) BY MOUTH EVERY OTHER DAY WITH BREAKFAST. 08/02/15  Yes Mosie Lukes, MD  Ipratropium-Albuterol (COMBIVENT RESPIMAT) 20-100 MCG/ACT AERS respimat Inhale 1 puff into the lungs every 6 (six) hours as needed for wheezing or shortness of breath. 03/08/15  Yes Barton Dubois, MD  lactose free nutrition (BOOST) LIQD Take 237 mLs by mouth daily.   Yes Historical Provider, MD  lisinopril (PRINIVIL,ZESTRIL) 10 MG tablet Take 1 tablet (10 mg total) by mouth daily. 07/26/15  Yes Yvonne R Lowne Chase, DO  methylcellulose (ARTIFICIAL TEARS) 1 % ophthalmic solution Place 1 drop into both eyes daily as needed (dry eyes).    Yes Historical Provider, MD  polyethylene glycol (MIRALAX / GLYCOLAX)  packet Take 17 g by mouth daily as needed for mild constipation. 07/27/13  Yes Kelvin Cellar, MD  Probiotic Product (PROBIOTIC DAILY PO) Take 1 capsule by mouth daily.    Yes Historical Provider, MD  ciprofloxacin (CIPRO) 250 MG tablet Take 1 tablet (250 mg total) by mouth 2 (two) times daily. Patient not taking: Reported on 09/25/2015 07/26/15   Rosalita Chessman Chase, DO  traMADol (ULTRAM) 50 MG tablet Take 1 tablet (50 mg total) by mouth every 6 (six) hours as needed for moderate pain. Patient not taking: Reported on 09/25/2015 06/01/15   Rod Can, MD   BP 187/62 mmHg  Pulse 65  Temp(Src) 99.5 F (37.5 C) (Rectal)  Resp 20  Ht 5\' 2"  (1.575 m)  Wt 110 lb (49.896 kg)  BMI 20.11 kg/m2  SpO2 90% Physical Exam  Constitutional: She appears well-developed and well-nourished.  Laying in bed. Tired and frail appearing but not distressed.   HENT:  Head: Normocephalic and atraumatic.  Eyes: Conjunctivae are normal. Right eye exhibits no discharge. Left eye exhibits no discharge.  Neck: Neck supple.  Cardiovascular: Normal rate, regular rhythm and normal heart sounds.  Exam reveals no gallop and no friction rub.   No murmur heard. Pulmonary/Chest: Effort normal and breath sounds normal. No respiratory distress.  Abdominal: Soft. She exhibits no distension. There is no tenderness.  Musculoskeletal: She exhibits no tenderness.  Mild LE edema, L>R. No calf tenderness. Negative Homan's. No palpable cords.    Neurological: She is alert.  Skin: Skin is warm and dry.  Psychiatric: Her behavior is normal.  Nursing note and vitals reviewed.   ED Course  Procedures (including critical care time) Labs Review Labs Reviewed  CBC WITH DIFFERENTIAL/PLATELET - Abnormal; Notable for the following:    WBC 11.6 (*)    Neutro Abs 8.8 (*)    All other components within normal limits  BASIC METABOLIC PANEL - Abnormal; Notable for the following:    Glucose, Bld 106 (*)    BUN 32 (*)    GFR calc non Af  Amer 52 (*)    All other components within normal limits  BRAIN NATRIURETIC PEPTIDE - Abnormal; Notable for the following:    B Natriuretic Peptide 121.5 (*)    All other components within normal limits  TROPONIN I  URINALYSIS, ROUTINE W REFLEX MICROSCOPIC (NOT AT [facility])    Imaging Review Dg Chest 2 View  09/30/2015  CLINICAL DATA:  Productive cough with shortness breath and weakness. EXAM: CHEST  2 VIEW COMPARISON:  09/25/2015 FINDINGS: Mild cardiomegaly. Left pacer remains in place, unchanged. No confluent airspace opacities, effusions or edema. No acute bony abnormality. IMPRESSION: Cardiomegaly.  No active disease. Electronically Signed   By: Rolm Baptise M.D.   On: 09/30/2015 10:28   Nm Pulmonary Perf And Vent  09/30/2015  CLINICAL DATA:  Shortness of breath, generalized weakness, cough and congestion. Evaluate for pulmonary embolism. EXAM: NUCLEAR MEDICINE VENTILATION - PERFUSION LUNG SCAN TECHNIQUE: Ventilation images were obtained in multiple projections using inhaled aerosol Tc-74m DTPA. Perfusion images were obtained in multiple projections after intravenous injection of Tc-12m MAA. RADIOPHARMACEUTICALS:  31.5 mCi Technetium-52m DTPA aerosol inhalation and 4.3 mCi Technetium-57m MAA IV COMPARISON:  Chest radiograph - earlier same day ; 09/25/2015 FINDINGS: Review of chest radiograph performed earlier today demonstrates grossly unchanged enlarged cardiac silhouette and mediastinal contours with exuberant calcifications about the mitral valve annulus. Left anterior chest wall dual lead pacemaker. The lungs are hyperexpanded. Bilateral infrahilar opacities favored to represent atelectasis. No pleural effusion or pneumothorax. No evidence of edema. Ventilation: There is minimal clumping of inhaled radiotracer about the bilateral pulmonary hila. A minimal amount of ingested radiotracer is seen within the hypopharynx, superior aspect of the esophagus and stomach. Perfusion: There is relative  homogeneous distribution of injected radiotracer throughout the bilateral pulmonary parenchyma without discrete segmental or subsegmental mismatched filling defect to suggest pulmonary embolism. IMPRESSION: Pulmonary embolism absent (very low probability of pulmonary embolism). Electronically Signed   By: Sandi Mariscal M.D.   On: 09/30/2015 16:00   I have personally reviewed and evaluated these images and lab results as part of my medical decision-making.   EKG Interpretation   Date/Time:  Friday Sep 30 2015 11:23:24 EDT Ventricular Rate:  63 PR Interval:  261 QRS Duration: 150 QT Interval:  447 QTC Calculation: 458 R Axis:   -68 Text Interpretation:  Atrial-sensed ventricular-paced rhythm Confirmed by  Wilson Singer  MD, Othar Curto ([id number]) on 09/30/2015 12:26:37 PM      MDM   Final diagnoses:  Hypoxemia    89yF with new oxygen requirement. I'm not exactly sure why. Imaging not very impressive. Has been on amox/azithromycin for past couple days w/o improvement. Wet sounding cough, but lungs sound clear to me. L hip surgery in January. Stuck multiple times but unable to get adequate IV for CT angio. Had VQ scan which was very low probability for PE. Denies CP and troponin negative. Lives at home with daughter who is a Marine scientist and pretty knowledgeable and sounds like she provides very good home care. At this time she cannot adequately care for her though. Will get social work involved. Suspect she will end up needing either home oxygen or placement. Will discuss with medicine for admission.     Virgel Manifold, MD 10/06/15 [phone]

## 2015-09-30 NOTE — ED Notes (Signed)
Patient transported to X-ray 

## 2015-09-30 NOTE — Progress Notes (Signed)
Pharmacy Antibiotic Note  Briana Thornton is a 82 y.o. female admitted on 09/30/2015 with possible aspiration pneumonia +/- UTI.  Pharmacy has been consulted for Unasyn dosing.  Plan: Unasyn 3 g IV q8 hr  Height: 5\' 2"  (157.5 cm) Weight: 110 lb (49.896 kg) IBW/kg (Calculated) : 50.1  Temp (24hrs), Avg:98.8 F (37.1 C), Min:98.1 F (36.7 C), Max:99.5 F (37.5 C)   Recent Labs Lab 09/25/15 2125 09/30/15 1240  WBC 8.0 11.6*  CREATININE 1.07* 0.95  LATICACIDVEN 1.1  --     Estimated Creatinine Clearance: 32.9 mL/min (by C-G formula based on Cr of 0.95).    No Known Allergies  Antimicrobials this admission: Unasyn 5/5 >>   Dose adjustments this admission: ---  Microbiology results: 5/5 UCx: canceled  5/5 Resp PCR panel  Thank you for allowing pharmacy to be a part of this patient's care.  Reuel Boom, PharmD, BCPS Pager: [phone] 09/30/2015, 6:37 PM

## 2015-09-30 NOTE — ED Notes (Signed)
Patient is resting comfortably. 

## 2015-09-30 NOTE — Progress Notes (Signed)
Utilization review completed.  L. J. Samayra Hebel RN, BSN, CM 

## 2015-09-30 NOTE — H&P (Addendum)
HISTORY AND PHYSICAL       PATIENT DETAILS Name: Briana Thornton Age: 82 y.o. Sex: female Date of Birth: [birth year] Admit Date: 09/30/2015 [id number], Erline Levine, MD Referring MD/NP/PA: Outpatient Specialists: Dr Crissie Sickles Patient coming from: Home   CHIEF COMPLAINT:  Worsening generalized weakness for the past few days Cough, nasal congestion-1 week  HPI: Briana Thornton is a 82 y.o. female with medical history significant of dementia, history of complete heart block status post permanent pacemaker placement, hypertension, failure to thrive syndrome-who presents to the ED with approximately one-week history of cough and nasal congestion. Please note, patient is a poor historian, her daughter who is a Therapist, sports provides much of the history. Apparently, multiple members of the family had a upper respiratory tract infection and are currently taking antibiotics. Patient started having similar symptoms approximately a week back. Her symptoms mostly consist cough that is occasionally productive. She also has nasal and sinus congestion. Along with these symptoms, she started experiencing worsening generalized weakness (at baseline, patient needs assistance to walk-and only gets around the room). She was recently seen in the emergency room a few days back, and empirically started on amoxicillin and Zithromax, however her symptoms continued to worsen. This morning, she was found to be incredibly weak and her lips were noted to be cyanotic. A pulse oximeter at home showed her O2 saturation to be in the mid 70s.. As a result she was brought to the emergency room for further evaluation and treatment.  No obvious fever noted, very hard to discern whether she has dysuria or frequency of urination. No obvious history of nausea, vomiting or diarrhea. No history of chest pain, or abdominal pain. No shortness of breath at rest, but does get short of breath with minimal activity.  ED Course: In the emergency  room, she was noted to be hypoxic-and required around 2-4 L of oxygen to maintain O2 saturation. A chest x-ray did not show any infiltrates, a VQ scan was read as low probability.  Lives at: Home Mobility: Minimally mobile-needs assistance to walk around her room. Chronic Indwelling Foley:no   REVIEW OF SYSTEMS:  Constitutional:   No  weight loss, night sweats,  Fevers, chills, fatigue.  HEENT:    No headaches,Tooth/dental problems  Cardio-vascular: No chest pain,Orthopnea, PND,lower extremity edema, anasarca, palpitations  GI:  No heartburn, indigestion, abdominal pain, nausea, vomiting, diarrhea, melena or hematochezia  Resp: No  hemoptysis,plueritic chest pain.   Skin:  No rash or lesions.  GU:  No hematuria No flank pain.  Musculoskeletal: No joint pain or swelling.  No decreased range of motion.  No back pain.  Endocrine: No heat intolerance, no cold intolerance, no polyuria, no polydipsia  Psych: No change in mood or affect.   ALLERGIES:  No Known Allergies  PAST MEDICAL HISTORY: Past Medical History  Diagnosis Date  . Chicken pox as a child  . Measles as a child  . IBS (irritable bowel syndrome)   . Diabetes mellitus 47    type 2  . Hyperlipidemia 72  . Hypertension 72  . Vision loss of right eye   . Vertigo 2010    benign  . Calcification of cartilage     ear  . Cardiovascular system disease 07/02/2011  . Hemorrhoid 07/02/2011  . Osteoporosis 07/02/2011  . Weight loss 07/31/2011  . Anemia 05/22/2012  . Broken wrist 10-22-11    left  . Depression 05/22/2012  . Dermatitis 05/22/2012    Right  neck  . DM type 2 with diabetic peripheral neuropathy ([place]) 05/18/2015  . Unsteady gait   . Generalized weakness   . Closed intertrochanteric fracture of left hip ([place])   . Enterococcus UTI   . Dysphagia   . Postoperative anemia due to acute blood loss   . Malnutrition of moderate degree ([place])   . Benign essential HTN   . HLD (hyperlipidemia)   .  Constipation     PAST SURGICAL HISTORY: Past Surgical History  Procedure Laterality Date  . Hemorroidectomy  1979  . Knee scoped  2002    right  . Rotator cuff repair  2004    right  . Abdominal hysterectomy  2008    partial still has ovaries  . Lens implant left  2006  . Pacemaker insertion  2010  . Wrist surgery  11-05-11    left wrist  . Intramedullary (im) nail intertrochanteric Right 07/24/2013    Procedure: INTRAMEDULLARY (IM) NAIL INTERTROCHANTRIC;  Surgeon: Gearlean Alf, MD;  Location: [facility];  Service: Orthopedics;  Laterality: Right;  . Intramedullary (im) nail intertrochanteric Left 05/31/2015    Procedure: INTRAMEDULLARY (IM) NAIL INTERTROCHANTRIC;  Surgeon: Rod Can, MD;  Location: [facility];  Service: Orthopedics;  Laterality: Left;    MEDICATIONS AT HOME: Prior to Admission medications   Medication Sig Start Date End Date Taking? Authorizing Provider  acetaminophen (TYLENOL) 500 MG tablet Take 1,000 mg by mouth every 8 (eight) hours as needed for mild pain, moderate pain or headache.    Yes Historical Provider, MD  alum & mag hydroxide-simeth (MAALOX ADVANCED) 200-200-20 MG/5ML suspension Take 30 mLs by mouth every 6 (six) hours as needed for indigestion or heartburn.   Yes Historical Provider, MD  amoxicillin (AMOXIL) 500 MG capsule Take 2 capsules (1,000 mg total) by mouth 2 (two) times daily. Patient taking differently: Take 1,000 mg by mouth 2 (two) times daily. Started 05/01 for 7 days 09/26/15  Yes Jola Schmidt, MD  aspirin EC 325 MG tablet Take 325 mg by mouth daily. For 30 days for prophylaxis.  Stop 07/21/15   Yes Historical Provider, MD  atorvastatin (LIPITOR) 10 MG tablet TAKE 1 TABLET (10 MG TOTAL) BY MOUTH DAILY. 04/26/15  Yes Mosie Lukes, MD  azithromycin (ZITHROMAX) 250 MG tablet Take 1 tablet (250 mg total) by mouth daily. 09/26/15  Yes Jola Schmidt, MD  docusate sodium (COLACE) 100 MG capsule Take 100 mg by mouth daily.    Yes Historical Provider, MD    donepezil (ARICEPT) 10 MG tablet Take 1 tablet (10 mg total) by mouth at bedtime. 04/26/15  Yes Mosie Lukes, MD  ferrous sulfate 325 (65 FE) MG tablet TAKE 1 TABLET (325 MG TOTAL) BY MOUTH DAILY WITH BREAKFAST. Patient taking differently: TAKE 1 TABLET (325 MG TOTAL) BY MOUTH EVERY OTHER DAY WITH BREAKFAST. 08/02/15  Yes Mosie Lukes, MD  Ipratropium-Albuterol (COMBIVENT RESPIMAT) 20-100 MCG/ACT AERS respimat Inhale 1 puff into the lungs every 6 (six) hours as needed for wheezing or shortness of breath. 03/08/15  Yes Barton Dubois, MD  lactose free nutrition (BOOST) LIQD Take 237 mLs by mouth daily.   Yes Historical Provider, MD  lisinopril (PRINIVIL,ZESTRIL) 10 MG tablet Take 1 tablet (10 mg total) by mouth daily. 07/26/15  Yes Yvonne R Lowne Chase, DO  methylcellulose (ARTIFICIAL TEARS) 1 % ophthalmic solution Place 1 drop into both eyes daily as needed (dry eyes).    Yes Historical Provider, MD  polyethylene glycol (MIRALAX / GLYCOLAX) packet  Take 17 g by mouth daily as needed for mild constipation. 07/27/13  Yes Kelvin Cellar, MD  Probiotic Product (PROBIOTIC DAILY PO) Take 1 capsule by mouth daily.    Yes Historical Provider, MD  ciprofloxacin (CIPRO) 250 MG tablet Take 1 tablet (250 mg total) by mouth 2 (two) times daily. Patient not taking: Reported on 09/25/2015 07/26/15   Rosalita Chessman Chase, DO  traMADol (ULTRAM) 50 MG tablet Take 1 tablet (50 mg total) by mouth every 6 (six) hours as needed for moderate pain. Patient not taking: Reported on 09/25/2015 06/01/15   Rod Can, MD    FAMILY HISTORY: Family History  Problem Relation Age of Onset  . Diabetes Mother     type 2  . Hypertension Mother   . Cancer Brother     lung-smoker  . Diabetes Daughter     pre diabetic  . Diabetes Son     type 2  . Cancer Maternal Grandfather     prostate    SOCIAL HISTORY:  reports that she has never smoked. She has never used smokeless tobacco. She reports that she does not drink alcohol  or use illicit drugs.  PHYSICAL EXAM: Blood pressure 187/62, pulse 65, temperature 99.5 F (37.5 C), temperature source Rectal, resp. rate 20, height 5\' 2"  (1.575 m), weight 49.896 kg (110 lb), SpO2 90 %.  General appearance :Awake, mildly confused, not in any distress. Speech Clear. Not toxic Looking HEENT: Atraumatic and Normocephalic, pupils equally reactive to light and accomodation Neck: supple, no JVD. No cervical lymphadenopathy.  Chest:Good air entry bilaterally-few bibasilar rales CVS: S1 S2 regular.  Abdomen: Bowel sounds present, Non tender and not distended with no gaurding, rigidity or rebound. Extremities: No ankle edema bilaterally, however left lower extremity appears slightly more swollen than the right. Both lower extremities are warm to touch.  Neurology:  Non focal Psychiatric: Normal judgment and insight. Alert and oriented x 3. Normal mood. Skin:No Rash Wounds:N/A  LABS ON ADMISSION:  I have personally reviewed following labs and imaging studies  CBC:  Recent Labs Lab 09/25/15 2125 09/30/15 1240  WBC 8.0 11.6*  NEUTROABS 6.0 8.8*  HGB 12.0 12.3  HCT 38.1 37.3  MCV 91.4 86.9  PLT 191 [id number]    Basic Metabolic Panel:  Recent Labs Lab 09/25/15 2125 09/30/15 1240  NA 144 145  K 3.9 4.3  CL 110 110  CO2 25 26  GLUCOSE 114* 106*  BUN 37* 32*  CREATININE 1.07* 0.95  CALCIUM 9.3 9.0    GFR: Estimated Creatinine Clearance: 32.9 mL/min (by C-G formula based on Cr of 0.95).  Liver Function Tests:  Recent Labs Lab 09/25/15 2125  AST 18  ALT 14  ALKPHOS 63  BILITOT 1.0  PROT 6.5  ALBUMIN 3.6   No results for input(s): LIPASE, AMYLASE in the last 168 hours. No results for input(s): AMMONIA in the last 168 hours.  Coagulation Profile: No results for input(s): INR, PROTIME in the last 168 hours.  Cardiac Enzymes:  Recent Labs Lab 09/25/15 2125 09/30/15 1240  TROPONINI <0.03 <0.03    BNP (last 3 results) No results for input(s):  PROBNP in the last 8760 hours.  HbA1C: No results for input(s): HGBA1C in the last 72 hours.  CBG: No results for input(s): GLUCAP in the last 168 hours.  Lipid Profile: No results for input(s): CHOL, HDL, LDLCALC, TRIG, CHOLHDL, LDLDIRECT in the last 72 hours.  Thyroid Function Tests: No results for input(s): TSH, T4TOTAL, FREET4, T3FREE, THYROIDAB  in the last 72 hours.  Anemia Panel: No results for input(s): VITAMINB12, FOLATE, FERRITIN, TIBC, IRON, RETICCTPCT in the last 72 hours.  Urine analysis:    Component Value Date/Time   COLORURINE YELLOW 09/30/2015 1616   APPEARANCEUR CLEAR 09/30/2015 1616   LABSPEC 1.027 09/30/2015 1616   PHURINE 6.0 09/30/2015 1616   GLUCOSEU NEGATIVE 09/30/2015 1616   GLUCOSEU NEGATIVE 04/26/2015 1155   HGBUR TRACE* 09/30/2015 1616   BILIRUBINUR SMALL* 09/30/2015 1616   BILIRUBINUR Neg 07/26/2015 1152   KETONESUR NEGATIVE 09/30/2015 1616   PROTEINUR 30* 09/30/2015 1616   PROTEINUR Small 07/26/2015 1152   UROBILINOGEN 2.0 07/26/2015 1152   UROBILINOGEN 1.0 04/26/2015 1155   NITRITE NEGATIVE 09/30/2015 1616   NITRITE Neg 07/26/2015 1152   LEUKOCYTESUR SMALL* 09/30/2015 1616    Sepsis Labs: Lactic Acid, Venous    Component Value Date/Time   LATICACIDVEN 1.1 09/25/2015 2125     Microbiology: No results found for this or any previous visit (from the past 240 hour(s)).    RADIOLOGIC STUDIES ON ADMISSION: Dg Chest 2 View  09/30/2015  CLINICAL DATA:  Productive cough with shortness breath and weakness. EXAM: CHEST  2 VIEW COMPARISON:  09/25/2015 FINDINGS: Mild cardiomegaly. Left pacer remains in place, unchanged. No confluent airspace opacities, effusions or edema. No acute bony abnormality. IMPRESSION: Cardiomegaly.  No active disease. Electronically Signed   By: Rolm Baptise M.D.   On: 09/30/2015 10:28   Nm Pulmonary Perf And Vent  09/30/2015  CLINICAL DATA:  Shortness of breath, generalized weakness, cough and congestion. Evaluate for  pulmonary embolism. EXAM: NUCLEAR MEDICINE VENTILATION - PERFUSION LUNG SCAN TECHNIQUE: Ventilation images were obtained in multiple projections using inhaled aerosol Tc-54m DTPA. Perfusion images were obtained in multiple projections after intravenous injection of Tc-30m MAA. RADIOPHARMACEUTICALS:  31.5 mCi Technetium-65m DTPA aerosol inhalation and 4.3 mCi Technetium-32m MAA IV COMPARISON:  Chest radiograph - earlier same day ; 09/25/2015 FINDINGS: Review of chest radiograph performed earlier today demonstrates grossly unchanged enlarged cardiac silhouette and mediastinal contours with exuberant calcifications about the mitral valve annulus. Left anterior chest wall dual lead pacemaker. The lungs are hyperexpanded. Bilateral infrahilar opacities favored to represent atelectasis. No pleural effusion or pneumothorax. No evidence of edema. Ventilation: There is minimal clumping of inhaled radiotracer about the bilateral pulmonary hila. A minimal amount of ingested radiotracer is seen within the hypopharynx, superior aspect of the esophagus and stomach. Perfusion: There is relative homogeneous distribution of injected radiotracer throughout the bilateral pulmonary parenchyma without discrete segmental or subsegmental mismatched filling defect to suggest pulmonary embolism. IMPRESSION: Pulmonary embolism absent (very low probability of pulmonary embolism). Electronically Signed   By: Sandi Mariscal M.D.   On: 09/30/2015 16:00    I have personally reviewed images of chest xray   EKG:  Personally reviewed. Paced rhythm  ASSESSMENT AND PLAN: Present on Admission:  . Acute respiratory failure with hypoxia: Suspect this is multifactorial with probable sinus/nasal congestion and occult aspiration playing a role. However she is very minimally mobile-her hypoxia (especially at home) seems disproportionate to her clinical presentation- pulmonary embolism pulmonary is still possible-although VQ scan is negative. Unable to  get a 20-gauge IV line to complete a CT angiogram Chest- hence we will go ahead and do a lower extremity Doppler (has mild left leg swelling). If a lower extremity Doppler is negative, I think we do not need to pursue further. In the meantime, will place her on empiric IV Unasyn, start anti-sinus therapy with Claritin, Flonase and Afrin. Cautiously  continue with the dysphagia 3 diet which she is on at home, and obtain a swallow evaluation.  Marland Kitchen ?UTI: Given dementia very hard to discern if she actually has symptoms-UA is equivocal-she will be on empiric Unasyn that should cover. Urine culture obtained-please follow.  . Suspected Aspiration PNA: See above  . Essential hypertension: Cautiously continue with lisinopril.  Marland Kitchen History of CVA: Continue aspirin and statin. Nonfocal exam.  . History of dysphagia: On dysphagia 3 diet at home. SLP evaluation ordered  . Dementia: Continue Aricept. Currently pleasantly confused and close to her baseline.   . Failure to thrive syndrome: Chronic issue-probably worsened due to acute illness. We will get PT, nutrition eval. Likely will require SNF placement.  . Generalized weakness: Minimally ambulatory-needs assistance to even just moving around the room. Likely worsened due to acute illness. Obtain PT eval, likely require SNF placement.  Shellia Carwin in place-history of complete heart block   Further plan will depend as patient's clinical course evolves and further radiologic and laboratory data become available. Patient will be monitored closely.  Above noted plan was discussed with patient/daughter face to face at bedside, they were in agreement.   CONSULTS: None  DVT Prophylaxis: Prophylactic Lovenox   Code Status: DNR  Disposition Plan:  Discharge likely to SNF in 2-3 days, depending on clinical course  Admission status: Inpatient  medical floor  Total time spent  55 minutes.Greater than 50% of this time was spent in counseling,  explanation of diagnosis, planning of further management, and coordination of care.  [facility] Pager [phone]  If 7PM-7AM, please contact night-coverage [email or web address] Password [email or web address] 09/30/2015, 5:09 PM

## 2015-09-30 NOTE — ED Notes (Signed)
Attempted once to collect labs unsuccessful MD at bedside. Nurse will start IV

## 2015-09-30 NOTE — ED Notes (Signed)
To lung scan

## 2015-09-30 NOTE — ED Notes (Signed)
Pt O2 sat dropped to 84% when moving from wheelchair to bed. Improved with rest.

## 2015-09-30 NOTE — ED Notes (Signed)
Pt c/o productive cough with SOB and weakness. Auscultated bilateral rhonchi. Has completed a zpack and currently taking amoxicillin for cough. Having post tussive chest soreness.

## 2015-09-30 NOTE — ED Notes (Signed)
Pt can go up at 15:33.

## 2015-10-01 ENCOUNTER — Inpatient Hospital Stay (HOSPITAL_COMMUNITY): Payer: Medicare Other

## 2015-10-01 DIAGNOSIS — R609 Edema, unspecified: Secondary | ICD-10-CM

## 2015-10-01 DIAGNOSIS — R1310 Dysphagia, unspecified: Secondary | ICD-10-CM

## 2015-10-01 LAB — CBC
HEMATOCRIT: 33.9 % — AB (ref 36.0–46.0)
Hemoglobin: 10.7 g/dL — ABNORMAL LOW (ref 12.0–15.0)
MCH: 28.4 pg (ref 26.0–34.0)
MCHC: 31.6 g/dL (ref 30.0–36.0)
MCV: 89.9 fL (ref 78.0–100.0)
Platelets: 227 10*3/uL (ref 150–400)
RBC: 3.77 MIL/uL — ABNORMAL LOW (ref 3.87–5.11)
RDW: 14 % (ref 11.5–15.5)
WBC: 7.7 10*3/uL (ref 4.0–10.5)

## 2015-10-01 LAB — RESPIRATORY PANEL BY PCR
ADENOVIRUS-RVPPCR: NOT DETECTED
BORDETELLA PERTUSSIS-RVPCR: NOT DETECTED
CHLAMYDOPHILA PNEUMONIAE-RVPPCR: NOT DETECTED
CORONAVIRUS HKU1-RVPPCR: NOT DETECTED
Coronavirus 229E: NOT DETECTED
Coronavirus NL63: NOT DETECTED
Coronavirus OC43: NOT DETECTED
INFLUENZA A H1 2009-RVPPR: NOT DETECTED
INFLUENZA A H3-RVPPCR: NOT DETECTED
Influenza A H1: NOT DETECTED
Influenza A: NOT DETECTED
Influenza B: NOT DETECTED
MYCOPLASMA PNEUMONIAE-RVPPCR: NOT DETECTED
Metapneumovirus: DETECTED — AB
PARAINFLUENZA VIRUS 2-RVPPCR: NOT DETECTED
Parainfluenza Virus 1: NOT DETECTED
Parainfluenza Virus 3: NOT DETECTED
Parainfluenza Virus 4: NOT DETECTED
RESPIRATORY SYNCYTIAL VIRUS-RVPPCR: NOT DETECTED
Rhinovirus / Enterovirus: NOT DETECTED

## 2015-10-01 LAB — BASIC METABOLIC PANEL
ANION GAP: 7 (ref 5–15)
BUN: 31 mg/dL — AB (ref 6–20)
CALCIUM: 8.7 mg/dL — AB (ref 8.9–10.3)
CO2: 25 mmol/L (ref 22–32)
Chloride: 114 mmol/L — ABNORMAL HIGH (ref 101–111)
Creatinine, Ser: 0.85 mg/dL (ref 0.44–1.00)
GFR calc Af Amer: >60 mL/min (ref >60)
GFR, EST NON AFRICAN AMERICAN: 60 mL/min — AB (ref >60)
GLUCOSE: 92 mg/dL (ref 65–99)
POTASSIUM: 3.4 mmol/L — AB (ref 3.5–5.1)
SODIUM: 146 mmol/L — AB (ref 135–145)

## 2015-10-01 MED ORDER — POTASSIUM CHLORIDE CRYS ER 20 MEQ PO TBCR
40.0000 meq | EXTENDED_RELEASE_TABLET | Freq: Once | ORAL | Status: AC
  Administered 2015-10-01: 40 meq via ORAL
  Filled 2015-10-01: qty 2

## 2015-10-01 MED ORDER — CETYLPYRIDINIUM CHLORIDE 0.05 % MT LIQD
7.0000 mL | Freq: Two times a day (BID) | OROMUCOSAL | Status: DC
  Administered 2015-10-01 – 2015-10-03 (×2): 7 mL via OROMUCOSAL

## 2015-10-01 MED ORDER — STARCH (THICKENING) PO POWD
ORAL | Status: DC | PRN
  Filled 2015-10-01: qty 227

## 2015-10-01 MED ORDER — HYDRALAZINE HCL 20 MG/ML IJ SOLN
5.0000 mg | Freq: Four times a day (QID) | INTRAMUSCULAR | Status: DC | PRN
  Administered 2015-10-01: 5 mg via INTRAVENOUS
  Filled 2015-10-01: qty 1

## 2015-10-01 NOTE — Progress Notes (Signed)
VASCULAR LAB PRELIMINARY  PRELIMINARY  PRELIMINARY  PRELIMINARY  Bilateral lower extremity venous duplex completed.    Preliminary report:  There is no DVT or SVT noted in the bilateral lower extremities.   Cassiel Fernandez, RVT 10/01/2015, 3:57 PM

## 2015-10-01 NOTE — Progress Notes (Signed)
Patients respiratory panel detected Metapneumovirus. Infectious disease notified. Per ID, Wendall Mola, patient does not need to be on droplet precautions. Will d/c droplet precautions.  Barbee Shropshire. Brigitte Pulse, RN

## 2015-10-01 NOTE — Progress Notes (Signed)
Assumed care of patient at 1330. Agree with previous RN shift assessment. Patient without concerns/complaints at this time.  Fia Hebert M. Jeraldin Fesler, RN 

## 2015-10-01 NOTE — Progress Notes (Addendum)
[facility] PROGRESS NOTE  Briana Thornton [id number] DOB: 1927-01-17 DOA: 09/30/2015  PCP: Penni Homans, MD  Brief HPI: 82 year old Caucasian female with a past medical history of some dementia, complete heart block status post pacemaker, hypertension, failure to thrive, presented with generalized weakness, cough, difficulty breathing. She was found to be hypoxic. She was hospitalized for further management.  Past medical history:  Past Medical History  Diagnosis Date  . Chicken pox as a child  . Measles as a child  . IBS (irritable bowel syndrome)   . Diabetes mellitus 47    type 2  . Hyperlipidemia 72  . Hypertension 72  . Vision loss of right eye   . Vertigo 2010    benign  . Calcification of cartilage     ear  . Cardiovascular system disease 07/02/2011  . Hemorrhoid 07/02/2011  . Osteoporosis 07/02/2011  . Weight loss 07/31/2011  . Anemia 05/22/2012  . Broken wrist 10-22-11    left  . Depression 05/22/2012  . Dermatitis 05/22/2012    Right neck  . DM type 2 with diabetic peripheral neuropathy ([place]) 05/18/2015  . Unsteady gait   . Generalized weakness   . Closed intertrochanteric fracture of left hip ([place])   . Enterococcus UTI   . Dysphagia   . Postoperative anemia due to acute blood loss   . Malnutrition of moderate degree ([place])   . Benign essential HTN   . HLD (hyperlipidemia)   . Constipation     Consultants: None  Procedures: None  Antibiotics: Unasyn  Subjective: Patient pleasantly confused. She states that she is feeling better today compared to yesterday. Denies any shortness of breath. No chest pains. Did have a coughing episode this morning. Dry cough.  Objective:  Vital Signs  Filed Vitals:   09/30/15 2017 09/30/15 2145 10/01/15 0415 10/01/15 0925  BP: 160/55  174/50 182/60  Pulse: 65  60   Temp:   97.7 F (36.5 C)   TempSrc:   Oral   Resp: 18  20   Height:      Weight:      SpO2: 99% 97% 97%     Intake/Output Summary (Last 24  hours) at 10/01/15 1238 Last data filed at 10/01/15 0630  Gross per 24 hour  Intake    700 ml  Output    301 ml  Net    399 ml   Filed Weights   09/30/15 0952  Weight: 49.896 kg (110 lb)    General appearance: alert, cooperative, appears stated age and no distress Resp: Coarse sounds bilaterally with a few crackles at the right base. No wheezing or rhonchi. Cardio: regular rate and rhythm, S1, S2 normal, no murmur, click, rub or gallop GI: soft, non-tender; bowel sounds normal; no masses,  no organomegaly Extremities: extremities normal, atraumatic, no cyanosis or edema Neurologic: Awake and alert. Pleasantly confused. No focal deficits.  Lab Results:  Data Reviewed: I have personally reviewed following labs and imaging studies  CBC:  Recent Labs Lab 09/25/15 2125 09/30/15 1240 10/01/15 0429  WBC 8.0 11.6* 7.7  NEUTROABS 6.0 8.8*  --   HGB 12.0 12.3 10.7*  HCT 38.1 37.3 33.9*  MCV 91.4 86.9 89.9  PLT 191 236 [id number]   Basic Metabolic Panel:  Recent Labs Lab 09/25/15 2125 09/30/15 1240 10/01/15 0429  NA 144 145 146*  K 3.9 4.3 3.4*  CL 110 110 114*  CO2 25 26 25   GLUCOSE 114* 106* 92  BUN 37* 32* 31*  CREATININE 1.07* 0.95 0.85  CALCIUM 9.3 9.0 8.7*   GFR: Estimated Creatinine Clearance: 36.7 mL/min (by C-G formula based on Cr of 0.85).  Liver Function Tests:  Recent Labs Lab 09/25/15 2125  AST 18  ALT 14  ALKPHOS 63  BILITOT 1.0  PROT 6.5  ALBUMIN 3.6   Cardiac Enzymes:  Recent Labs Lab 09/25/15 2125 09/30/15 1240  TROPONINI <0.03 <0.03   Urine analysis:    Component Value Date/Time   COLORURINE YELLOW 09/30/2015 [place] 09/30/2015 1616   LABSPEC 1.027 09/30/2015 1616   PHURINE 6.0 09/30/2015 1616   GLUCOSEU NEGATIVE 09/30/2015 1616   GLUCOSEU NEGATIVE 04/26/2015 1155   HGBUR TRACE* 09/30/2015 1616   BILIRUBINUR SMALL* 09/30/2015 1616   BILIRUBINUR Neg 07/26/2015 1152   [place] 09/30/2015 1616    PROTEINUR 30* 09/30/2015 1616   PROTEINUR Small 07/26/2015 1152   UROBILINOGEN 2.0 07/26/2015 1152   UROBILINOGEN 1.0 04/26/2015 1155   NITRITE NEGATIVE 09/30/2015 1616   NITRITE Neg 07/26/2015 1152   LEUKOCYTESUR SMALL* 09/30/2015 1616    Recent Results (from the past 240 hour(s))  Respiratory Panel by PCR     Status: Abnormal   Collection Time: 09/30/15  9:28 PM  Result Value Ref Range Status   Adenovirus NOT DETECTED NOT DETECTED Final   Coronavirus 229E NOT DETECTED NOT DETECTED Final   Coronavirus HKU1 NOT DETECTED NOT DETECTED Final   Coronavirus NL63 NOT DETECTED NOT DETECTED Final   Coronavirus OC43 NOT DETECTED NOT DETECTED Final   Metapneumovirus DETECTED (A) NOT DETECTED Final   Rhinovirus / Enterovirus NOT DETECTED NOT DETECTED Final   Influenza A NOT DETECTED NOT DETECTED Final   Influenza A H1 NOT DETECTED NOT DETECTED Final   Influenza A H1 2009 NOT DETECTED NOT DETECTED Final   Influenza A H3 NOT DETECTED NOT DETECTED Final   Influenza B NOT DETECTED NOT DETECTED Final   Parainfluenza Virus 1 NOT DETECTED NOT DETECTED Final   Parainfluenza Virus 2 NOT DETECTED NOT DETECTED Final   Parainfluenza Virus 3 NOT DETECTED NOT DETECTED Final   Parainfluenza Virus 4 NOT DETECTED NOT DETECTED Final   Respiratory Syncytial Virus NOT DETECTED NOT DETECTED Final   Bordetella pertussis NOT DETECTED NOT DETECTED Final   Chlamydophila pneumoniae NOT DETECTED NOT DETECTED Final   Mycoplasma pneumoniae NOT DETECTED NOT DETECTED Final    Comment: Performed at [facility]      Radiology Studies: Dg Chest 2 View  09/30/2015  CLINICAL DATA:  Productive cough with shortness breath and weakness. EXAM: CHEST  2 VIEW COMPARISON:  09/25/2015 FINDINGS: Mild cardiomegaly. Left pacer remains in place, unchanged. No confluent airspace opacities, effusions or edema. No acute bony abnormality. IMPRESSION: Cardiomegaly.  No active disease. Electronically Signed   By: Rolm Baptise M.D.    On: 09/30/2015 10:28   Nm Pulmonary Perf And Vent  09/30/2015  CLINICAL DATA:  Shortness of breath, generalized weakness, cough and congestion. Evaluate for pulmonary embolism. EXAM: NUCLEAR MEDICINE VENTILATION - PERFUSION LUNG SCAN TECHNIQUE: Ventilation images were obtained in multiple projections using inhaled aerosol Tc-79m DTPA. Perfusion images were obtained in multiple projections after intravenous injection of Tc-43m MAA. RADIOPHARMACEUTICALS:  31.5 mCi Technetium-38m DTPA aerosol inhalation and 4.3 mCi Technetium-66m MAA IV COMPARISON:  Chest radiograph - earlier same day ; 09/25/2015 FINDINGS: Review of chest radiograph performed earlier today demonstrates grossly unchanged enlarged cardiac silhouette and mediastinal contours with exuberant calcifications about the mitral valve annulus. Left anterior chest wall dual  lead pacemaker. The lungs are hyperexpanded. Bilateral infrahilar opacities favored to represent atelectasis. No pleural effusion or pneumothorax. No evidence of edema. Ventilation: There is minimal clumping of inhaled radiotracer about the bilateral pulmonary hila. A minimal amount of ingested radiotracer is seen within the hypopharynx, superior aspect of the esophagus and stomach. Perfusion: There is relative homogeneous distribution of injected radiotracer throughout the bilateral pulmonary parenchyma without discrete segmental or subsegmental mismatched filling defect to suggest pulmonary embolism. IMPRESSION: Pulmonary embolism absent (very low probability of pulmonary embolism). Electronically Signed   By: Sandi Mariscal M.D.   On: 09/30/2015 16:00     Medications:  Scheduled: . ampicillin-sulbactam (UNASYN) IV  3 g Intravenous Q8H  . aspirin EC  325 mg Oral Daily  . atorvastatin  10 mg Oral q1800  . donepezil  10 mg Oral QHS  . enoxaparin (LOVENOX) injection  40 mg Subcutaneous Q24H  . ferrous sulfate  325 mg Oral Q breakfast  . fluticasone  2 spray Each Nare Daily  .  ipratropium-albuterol  3 mL Nebulization BID  . lactose free nutrition  237 mL Oral Daily  . lisinopril  10 mg Oral Daily  . loratadine  10 mg Oral Daily  . oxymetazoline  1 spray Each Nare BID  . potassium chloride  40 mEq Oral Once  . saccharomyces boulardii  250 mg Oral BID   Continuous:  [id number], albuterol, alum & mag hydroxide-simeth, bisacodyl, ondansetron **OR** ondansetron (ZOFRAN) IV, polyethylene glycol, polyvinyl alcohol  Assessment/Plan:  Principal Problem:   PNA (pneumonia) Active Problems:   Essential hypertension   DM (diabetes mellitus) (HCC)   Pacemaker-St.Jude   Dysphagia   Acute respiratory failure with hypoxia (HCC)   Dementia   H/O: CVA (cerebrovascular accident)   UTI (lower urinary tract infection)    Acute respiratory failure with hypoxia No specific etiology has been found. VQ scan was low probability for PE. Initial chest x-ray did not show any clear infiltrate. It was felt that aspiration could be contributing. She does have few crackles in the right base today. Might benefit from repeating chest x-ray. Lower extremity venous Doppler is pending. However, venous thromboembolism seems to be less likely at this time. Continue Unasyn for now. Continue treatment for sinus congestion. She is on a dysphagia 3 diet. Swallow evaluation is pending.   Questionable UTI Given dementia very hard to discern if she actually has symptoms. UA is equivocal. Continue current antibiotics. Follow up on urine cultures.  Suspected Aspiration PNA See above  Essential hypertension Blood pressure remains elevated. She is noted to be only on lisinopril at home. Consider increasing dose.   History of CVA Continue aspirin and statin. Nonfocal exam.  History of dysphagia On dysphagia 3 diet at home. SLP evaluation ordered  Dementia Continue Aricept. Currently pleasantly confused and close to her baseline.  Failure to thrive syndrome/Generalized weakness Chronic  issue-probably worsened due to acute illness. PT/OT evaluation.   History of complete heart block status post pacemaker placement  Stable  DVT Prophylaxis: Lovenox    Code Status: DO NOT RESUSCITATE  Family Communication: No family at bedside. Discussed with daughter. Disposition Plan: Continue management as outlined.    LOS: 1 day   [facility] Pager [phone] 10/01/2015, 12:38 PM  If 7PM-7AM, please contact night-coverage at [email or web address], password [facility]

## 2015-10-01 NOTE — Evaluation (Signed)
Clinical/Bedside Swallow Evaluation Patient Details  Name: Briana Thornton MRN: [id number] Date of Birth: 1926/07/03  Today's Date: 10/01/2015 Time: SLP Start Time (ACUTE ONLY): [id number] SLP Stop Time (ACUTE ONLY): 1609 SLP Time Calculation (min) (ACUTE ONLY): 25 min  Past Medical History:  Past Medical History  Diagnosis Date  . Chicken pox as a child  . Measles as a child  . IBS (irritable bowel syndrome)   . Diabetes mellitus 47    type 2  . Hyperlipidemia 72  . Hypertension 72  . Vision loss of right eye   . Vertigo 2010    benign  . Calcification of cartilage     ear  . Cardiovascular system disease 07/02/2011  . Hemorrhoid 07/02/2011  . Osteoporosis 07/02/2011  . Weight loss 07/31/2011  . Anemia 05/22/2012  . Broken wrist 10-22-11    left  . Depression 05/22/2012  . Dermatitis 05/22/2012    Right neck  . DM type 2 with diabetic peripheral neuropathy ([place]) 05/18/2015  . Unsteady gait   . Generalized weakness   . Closed intertrochanteric fracture of left hip ([place])   . Enterococcus UTI   . Dysphagia   . Postoperative anemia due to acute blood loss   . Malnutrition of moderate degree ([place])   . Benign essential HTN   . HLD (hyperlipidemia)   . Constipation    Past Surgical History:  Past Surgical History  Procedure Laterality Date  . Hemorroidectomy  1979  . Knee scoped  2002    right  . Rotator cuff repair  2004    right  . Abdominal hysterectomy  2008    partial still has ovaries  . Lens implant left  2006  . Pacemaker insertion  2010  . Wrist surgery  11-05-11    left wrist  . Intramedullary (im) nail intertrochanteric Right 07/24/2013    Procedure: INTRAMEDULLARY (IM) NAIL INTERTROCHANTRIC;  Surgeon: Gearlean Alf, MD;  Location: [facility];  Service: Orthopedics;  Laterality: Right;  . Intramedullary (im) nail intertrochanteric Left 05/31/2015    Procedure: INTRAMEDULLARY (IM) NAIL INTERTROCHANTRIC;  Surgeon: Rod Can, MD;  Location: [facility];  Service: Orthopedics;   Laterality: Left;   HPI:  82 y.o. female with medical history significant of dementia, history of complete heart block status post permanent pacemaker placement, hypertension, failure to thrive syndrome-who presents to the ED with approximately one-week history of cough and nasal congestion. Please note, patient is a poor historian, her daughter who is a Therapist, sports provides much of the history. Apparently, multiple members of the family had a upper respiratory tract infection and are currently taking antibiotics. Patient started having similar symptoms approximately a week back. Her symptoms mostly consist cough that is occasionally productive. She also has nasal and sinus congestion. Along with these symptoms, she started experiencing worsening generalized weakness (at baseline, patient needs assistance to walk-and only gets around the room). She was recently seen in the emergency room a few days back, and empirically started on amoxicillin and Zithromax, however her symptoms continued to worsen. This morning, she was found to be incredibly weak and her lips were noted to be cyanotic. A pulse oximeter at home showed her O2 saturation to be in the mid 70s; pt had a MBS in 1/17 indicating silent aspiration with thin liquids and nectar-thickened liquids/Dysphagia 3 diet were recommended to reduce/eliminate aspiration risk.   Assessment / Plan / Recommendation Clinical Impression   Pt with overt s/s of aspiration with thin via cup with audible multiple  swallows noted suggesting possible pharyngeal weakness present paired with suspected delay in the initiation of the swallow put her at risk for aspiration with thin liquids; this resolved with use of nectar-thickened liquids and pt stated "that went down smoother" upon presentation with cup/straw sips.  Pt exhibited increased oral propulsion/holding intermittently with boluses provided and stated solids were "harder for her" to consume; Dysphagia 3/nectar-thickened  liquids recommended to reduce/eliminate risk of aspiration with intermittent verbal cues for small bites/sips and slow rate.  ST will f/u 1-2x for diet tolerance.    Aspiration Risk  Mild aspiration risk    Diet Recommendation   Dysphagia 3/nectar-thickened liquids  Medication Administration: Whole meds with puree    Other  Recommendations Oral Care Recommendations: Oral care BID Other Recommendations: Order thickener from pharmacy   Follow up Recommendations  None    Frequency and Duration min 2x/week  1 week       Prognosis Prognosis for Safe Diet Advancement: Good Barriers to Reach Goals: Cognitive deficits      Swallow Study   General Date of Onset: 09/30/15 HPI: 82 y.o. female with medical history significant of dementia, history of complete heart block status post permanent pacemaker placement, hypertension, failure to thrive syndrome-who presents to the ED with approximately one-week history of cough and nasal congestion. Please note, patient is a poor historian, her daughter who is a Therapist, sports provides much of the history. Apparently, multiple members of the family had a upper respiratory tract infection and are currently taking antibiotics. Patient started having similar symptoms approximately a week back. Her symptoms mostly consist cough that is occasionally productive. She also has nasal and sinus congestion. Along with these symptoms, she started experiencing worsening generalized weakness (at baseline, patient needs assistance to walk-and only gets around the room). She was recently seen in the emergency room a few days back, and empirically started on amoxicillin and Zithromax, however her symptoms continued to worsen. This morning, she was found to be incredibly weak and her lips were noted to be cyanotic. A pulse oximeter at home showed her O2 saturation to be in the mid 70s Type of Study: Bedside Swallow Evaluation Previous Swallow Assessment: MBS 1/17 indicated silent  aspiration with thin liquids; D3/nectar recommended Diet Prior to this Study: Dysphagia 3 (soft);Thin liquids Temperature Spikes Noted: No Respiratory Status: Room air History of Recent Intubation: No Behavior/Cognition: Alert;Cooperative Oral Cavity Assessment: Within Functional Limits Oral Care Completed by SLP: No Oral Cavity - Dentition: Adequate natural dentition Vision: Functional for self-feeding Self-Feeding Abilities: Able to feed self;Needs assist Patient Positioning: Upright in bed Baseline Vocal Quality: Normal Volitional Cough: Strong Volitional Swallow: Able to elicit    Oral/Motor/Sensory Function Overall Oral Motor/Sensory Function: Within functional limits   Ice Chips Ice chips: Impaired Presentation: Spoon Oral Phase Functional Implications: Oral holding Pharyngeal Phase Impairments: Suspected delayed Swallow;Multiple swallows   Thin Liquid Thin Liquid: Impaired Presentation: Cup Pharyngeal  Phase Impairments: Suspected delayed Swallow;Cough - Immediate    Nectar Thick Nectar Thick Liquid: Impaired Presentation: Cup;Straw Oral phase functional implications: Prolonged oral transit Pharyngeal Phase Impairments: Suspected delayed Swallow;Multiple swallows   Honey Thick Honey Thick Liquid: Not tested   Puree Puree: Within functional limits Presentation: Spoon   Solid      Solid: Impaired Presentation: Self Fed Oral Phase Impairments: Impaired mastication Oral Phase Functional Implications: Prolonged oral transit Pharyngeal Phase Impairments: Suspected delayed Swallow;Multiple swallows        Macel Yearsley,PAT, M.S., CCC-SLP 10/01/2015,4:34 PM

## 2015-10-01 NOTE — Evaluation (Signed)
Physical Therapy Evaluation Patient Details Name: Briana Thornton MRN: [id number] DOB: 24-Sep-1926 Today's Date: 10/01/2015   History of Present Illness  Briana Thornton is a 82 y.o. female with medical history significant of dementia, history of complete heart block status post permanent pacemaker placement, hypertension, failure to thrive syndrome-who presents to the ED with approximately one-week history of cough and nasal congestion  Clinical Impression  Pt admitted with above diagnosis. Pt currently with functional limitations due to the deficits listed below (see PT Problem List).   Pt will benefit from skilled PT to increase their independence and safety with mobility to allow discharge to the venue listed below.   Would benefit form SNF depending on progress, will continue to follow     Follow Up Recommendations Home health PT;SNF (vs)    Equipment Recommendations  None recommended by PT    Recommendations for Other Services       Precautions / Restrictions Precautions Precautions: Fall Restrictions Weight Bearing Restrictions: No      Mobility  Bed Mobility Overal bed mobility: Needs Assistance Bed Mobility: Supine to Sit     Supine to sit: Min assist;HOB elevated;Mod assist (bed not working, unable to get [facility] down)     General bed mobility comments: cues for self assist, bed pad used to scoot pt  to EOB  Transfers Overall transfer level: Needs assistance Equipment used: Rolling walker (2 wheeled) Transfers: Sit to/from Stand Sit to Stand: Min assist         General transfer comment: cues for hand placement  Ambulation/Gait Ambulation/Gait assistance: Min assist;+2 safety/equipment (chair) Ambulation Distance (Feet): 60 Feet Assistive device: Rolling walker (2 wheeled) Gait Pattern/deviations: Step-through pattern;Trunk flexed;Decreased stride length     General Gait Details: cues for breathing, RW position and posture; O2 sats 97% on RA after amb, running  low 90s with 2L at rest per [employer] Mobility    Modified Rankin (Stroke Patients Only)       Balance                                             Pertinent Vitals/Pain Pain Assessment: No/denies pain    Home Living Family/patient expects to be discharged to:: Unsure Living Arrangements: Children               Additional Comments: lives with her dtr who is RN Leisure centre manager) at [employer]    Prior Function Level of Independence: Needs assistance   Gait / Transfers Assistance Needed: recently requiring incr assist for transfers at home per [name]     Comments: uses depends     Hand Dominance        Extremity/Trunk Assessment   Upper Extremity Assessment: Generalized weakness           Lower Extremity Assessment: Generalized weakness         Communication      Cognition Arousal/Alertness: Awake/alert Behavior During Therapy: WFL for tasks assessed/performed Overall Cognitive Status: Within Functional Limits for tasks assessed                      General Comments      Exercises        Assessment/Plan    PT Assessment Patient needs continued PT services  PT Diagnosis Difficulty walking;Generalized weakness  PT Problem List Decreased strength;Decreased activity tolerance;Decreased mobility;Cardiopulmonary status limiting activity  PT Treatment Interventions DME instruction;Gait training;Functional mobility training;Therapeutic activities;Therapeutic exercise;Patient/family education   PT Goals (Current goals can be found in the Care Plan section) Acute Rehab PT Goals Patient Stated Goal: get stronger PT Goal Formulation: With patient Time For Goal Achievement: 10/15/15 Potential to Achieve Goals: Good    Frequency Min 4X/week   Barriers to discharge        Co-evaluation               End of Session Equipment Utilized During Treatment: Gait belt Activity Tolerance: Patient  tolerated treatment well Patient left: in chair;with call bell/phone within reach;with chair alarm set;with family/visitor present Nurse Communication: Mobility status         Time: [id number] PT Time Calculation (min) (ACUTE ONLY): 19 min   Charges:   PT Evaluation $PT Eval Low Complexity: 1 Procedure     PT G Codes:        Briana Thornton 10/13/15, 3:54 PM

## 2015-10-02 DIAGNOSIS — J208 Acute bronchitis due to other specified organisms: Secondary | ICD-10-CM | POA: Diagnosis present

## 2015-10-02 DIAGNOSIS — B9781 Human metapneumovirus as the cause of diseases classified elsewhere: Secondary | ICD-10-CM | POA: Diagnosis present

## 2015-10-02 LAB — BASIC METABOLIC PANEL
ANION GAP: 9 (ref 5–15)
BUN: 25 mg/dL — ABNORMAL HIGH (ref 6–20)
CHLORIDE: 113 mmol/L — AB (ref 101–111)
CO2: 24 mmol/L (ref 22–32)
Calcium: 8.5 mg/dL — ABNORMAL LOW (ref 8.9–10.3)
Creatinine, Ser: 0.77 mg/dL (ref 0.44–1.00)
GFR calc Af Amer: >60 mL/min (ref >60)
GLUCOSE: 102 mg/dL — AB (ref 65–99)
POTASSIUM: 3.4 mmol/L — AB (ref 3.5–5.1)
Sodium: 146 mmol/L — ABNORMAL HIGH (ref 135–145)

## 2015-10-02 LAB — CBC
HEMATOCRIT: 32.2 % — AB (ref 36.0–46.0)
HEMOGLOBIN: 10.4 g/dL — AB (ref 12.0–15.0)
MCH: 28.9 pg (ref 26.0–34.0)
MCHC: 32.3 g/dL (ref 30.0–36.0)
MCV: 89.4 fL (ref 78.0–100.0)
Platelets: 233 10*3/uL (ref 150–400)
RBC: 3.6 MIL/uL — AB (ref 3.87–5.11)
RDW: 13.8 % (ref 11.5–15.5)
WBC: 7.4 10*3/uL (ref 4.0–10.5)

## 2015-10-02 MED ORDER — BISACODYL 10 MG RE SUPP
10.0000 mg | Freq: Once | RECTAL | Status: AC
  Administered 2015-10-02: 10 mg via RECTAL
  Filled 2015-10-02: qty 1

## 2015-10-02 MED ORDER — POTASSIUM CHLORIDE 20 MEQ/15ML (10%) PO SOLN
40.0000 meq | Freq: Once | ORAL | Status: AC
  Administered 2015-10-02: 40 meq via ORAL
  Filled 2015-10-02: qty 30

## 2015-10-02 MED ORDER — BOOST PO LIQD
237.0000 mL | Freq: Two times a day (BID) | ORAL | Status: DC
  Administered 2015-10-02 – 2015-10-03 (×2): 237 mL via ORAL
  Filled 2015-10-02 (×3): qty 237

## 2015-10-02 MED ORDER — LISINOPRIL 5 MG PO TABS
5.0000 mg | ORAL_TABLET | Freq: Every evening | ORAL | Status: DC
  Administered 2015-10-02: 5 mg via ORAL
  Filled 2015-10-02 (×2): qty 1

## 2015-10-02 MED ORDER — POTASSIUM CHLORIDE CRYS ER 20 MEQ PO TBCR
40.0000 meq | EXTENDED_RELEASE_TABLET | Freq: Once | ORAL | Status: DC
  Filled 2015-10-02: qty 2

## 2015-10-02 MED ORDER — IPRATROPIUM-ALBUTEROL 0.5-2.5 (3) MG/3ML IN SOLN
3.0000 mL | Freq: Four times a day (QID) | RESPIRATORY_TRACT | Status: DC
  Administered 2015-10-02 – 2015-10-03 (×4): 3 mL via RESPIRATORY_TRACT
  Filled 2015-10-02 (×4): qty 3

## 2015-10-02 MED ORDER — AMOXICILLIN-POT CLAVULANATE 875-125 MG PO TABS
1.0000 | ORAL_TABLET | Freq: Two times a day (BID) | ORAL | Status: DC
  Administered 2015-10-02 – 2015-10-03 (×2): 1 via ORAL
  Filled 2015-10-02 (×3): qty 1

## 2015-10-02 MED ORDER — POLYETHYLENE GLYCOL 3350 17 G PO PACK
17.0000 g | PACK | Freq: Two times a day (BID) | ORAL | Status: DC
  Administered 2015-10-02: 17 g via ORAL

## 2015-10-02 MED ORDER — IPRATROPIUM-ALBUTEROL 20-100 MCG/ACT IN AERS: 2.0000 | INHALATION_SPRAY | Freq: Four times a day (QID) | RESPIRATORY_TRACT | Status: DC

## 2015-10-02 NOTE — Clinical Social Work Note (Signed)
Clinical Social Work Assessment  Patient Details  Name: Briana Thornton MRN: [id number] Date of Birth: 1927/01/10  Date of referral:  10/02/15               Reason for consult:  Facility Placement                Permission sought to share information with:  Family Supports Permission granted to share information::  No (pt DO)  Name::     [name]::  [facility] SNF  Relationship::  dtr  Contact Information:     Housing/Transportation Living arrangements for the past 2 months:  Single Family Home Source of Information:  Adult Children Patient Interpreter Needed:  None Criminal Activity/Legal Involvement Pertinent to Current Situation/Hospitalization:  No - Comment as needed Significant Relationships:  Adult Children Lives with:  Adult Children ([place]) Do you feel safe going back to the place where you live?  No Need for family participation in patient care:  Yes (Comment) (decision making)  Care giving concerns: pt normally lives with dtr, [name], who is a Therapist, sports with [employer].  [name] reports that pt is too weak at this time to return safely home.   Social Worker assessment / plan:  CSW spoke with [name] to discuss PT recommendation for SNF at time of DC.  [name] reports that pt has been to SNF in the past so they are familiar with process.  Employment status:  Retired Nurse, adult PT Recommendations:  [place] / Referral to community resources:  [place]  Patient/Family's Response to care:  [name] is agreeable to short term SNF placement- prefers [facility] or [facility] where pt has been in the past.  Patient/Family's Understanding of and Emotional Response to Diagnosis, Current Treatment, and Prognosis: no questions or concerns at this time- [name] is very informed about pt needs since she is a Marine scientist and understands prognosis/treatment plan  Emotional Assessment Appearance:  Appears stated  age Attitude/Demeanor/Rapport:  Unable to Assess Affect (typically observed):  Unable to Assess Orientation:  Oriented to Self, Fluctuating Orientation (Suspected and/or reported Sundowners), Oriented to Place Alcohol / Substance use:  Not Applicable Psych involvement (Current and /or in the community):  No (Comment)  Discharge Needs  Concerns to be addressed:  Care Coordination Readmission within the last 30 days:  No Current discharge risk:  Physical Impairment Barriers to Discharge:  Continued Medical Work up   [employer], LCSW 10/02/2015, 1:00 PM

## 2015-10-02 NOTE — Progress Notes (Signed)
Physical Therapy Treatment Patient Details Name: Briana Thornton MRN: [id number] DOB: [birth year] Today's Date: 10/02/2015    History of Present Illness Briana Thornton is a 82 y.o. female with medical history significant of dementia, history of complete heart block status post permanent pacemaker placement, hypertension, failure to thrive syndrome-who presents to the ED with approximately one-week history of cough and nasal congestion    PT Comments    Pt cooperative with PT, feeling better but remains quite deconditioned; would benefit from STSNF level therapies post acute to incr independence; pt tol to activity improving but remains a high fall risk with LOB x2 during gait requiring heavy min assist to recover; will continue to follow;   Follow Up Recommendations  SNF;Supervision/Assistance - 24 hour     Equipment Recommendations  None recommended by PT    Recommendations for Other Services       Precautions / Restrictions Restrictions Weight Bearing Restrictions: No    Mobility  Bed Mobility Overal bed mobility: Needs Assistance Bed Mobility: Supine to Sit     Supine to sit: Min assist;HOB elevated;Mod assist     General bed mobility comments: cues for self assist, bed pad used to scoot pt  to EOB. incr time  Transfers Overall transfer level: Needs assistance Equipment used: Rolling walker (2 wheeled) Transfers: Sit to/from Stand Sit to Stand: Min assist         General transfer comment: cues for hand placement  Ambulation/Gait Ambulation/Gait assistance: Min assist;Mod assist Ambulation Distance (Feet): 80 Feet Assistive device: Rolling walker (2 wheeled) Gait Pattern/deviations: Step-through pattern;Decreased stride length;Narrow base of support     General Gait Details: cues for breathing, RW position and posture; pt requires incr assist and cues with turns; LOB x2 with heavy min assist to recover   Stairs            Wheelchair Mobility     Modified Rankin (Stroke Patients Only)       Balance                                    Cognition Arousal/Alertness: Awake/alert Behavior During Therapy: WFL for tasks assessed/performed Overall Cognitive Status: Within Functional Limits for tasks assessed                      Exercises      General Comments        Pertinent Vitals/Pain Pain Assessment: No/denies pain    Home Living                      Prior Function            PT Goals (current goals can now be found in the care plan section) Acute Rehab PT Goals Patient Stated Goal: get stronger PT Goal Formulation: With patient Time For Goal Achievement: 10/15/15 Potential to Achieve Goals: Good Progress towards PT goals: Progressing toward goals    Frequency  Min 4X/week    PT Plan Current plan remains appropriate    Co-evaluation             End of Session Equipment Utilized During Treatment: Gait belt Activity Tolerance: Patient tolerated treatment well Patient left: in chair;with call bell/phone within reach;with chair alarm set;with family/visitor present     Time: 1217-1233 PT Time Calculation (min) (ACUTE ONLY): 16 min  Charges:  $Gait Training: 8-22 mins  G CodesKenyon Ana 10/02/2015, 12:45 PM

## 2015-10-02 NOTE — NC FL2 (Signed)
[place] MEDICAID FL2 LEVEL OF CARE SCREENING TOOL     IDENTIFICATION  Patient Name: Briana Thornton Birthdate: 1926/09/25 Sex: female Admission Date (Current Location): 09/30/2015  [facility] and [place] Number:  Herbalist and Address:  [facility],  [place] [address], [place]      Provider Number: [id number]  Attending Physician Name and Address:  Bonnielee Haff, MD  Relative Name and Phone Number:       Current Level of Care: Hospital Recommended Level of Care: [place] Prior Approval Number:    Date Approved/Denied:   PASRR Number: [id number]  Discharge Plan: SNF    Current Diagnoses: Patient Active Problem List   Diagnosis Date Noted  . Acute bronchitis due to human metapneumovirus 10/02/2015  . Acute respiratory failure with hypoxia ([place]) 09/30/2015  . Dementia 09/30/2015  . H/O: CVA (cerebrovascular accident) 09/30/2015  . UTI (lower urinary tract infection) 09/30/2015  . Enterococcus UTI 06/03/2015  . Femur fracture, left ([place]) 05/31/2015  . Intertrochanteric fracture of left hip ([place]) 05/31/2015  . Fracture, intertrochanteric, left femur ([place]) 05/31/2015  . Absolute anemia   . Labile blood pressure   . Hypoalbuminemia due to protein-calorie malnutrition ([place])   . Malnutrition of moderate degree 05/18/2015  . TIA (transient ischemic attack) 05/18/2015  . Left pontine cerebrovascular accident ([place]) 05/18/2015  . DM type 2 with diabetic peripheral neuropathy ([place]) 05/18/2015  . CAP (community acquired pneumonia) 03/05/2015  . SOB (shortness of breath) 03/05/2015  . Hypoxia 03/05/2015  . Weakness 03/05/2015  . Dehydration, mild 03/05/2015  . Controlled type 2 diabetes mellitus with diabetic nephropathy, without long-term current use of insulin ([place])   . Onychomycosis 10/31/2014  . UTI symptoms 01/25/2014  . Dysphagia 01/25/2014  . Abnormal finding on urinalysis 01/25/2014  . Dementia without behavioral  disturbance 08/06/2013  . Postoperative anemia due to acute blood loss 07/26/2013  . Hip fracture ([place]) 07/24/2013  . Thyroid mass of unclear etiology 07/24/2013  . Fall at home 07/24/2013  . Cerumen impaction 04/21/2013  . Cognitive decline 02/15/2013  . Anemia 05/22/2012  . Depression 05/22/2012  . Dermatitis 05/22/2012  . Broken wrist   . Pacemaker-St.Jude 09/18/2011  . URI (upper respiratory infection) 08/31/2011  . Lymph node enlargement 08/31/2011  . Weight loss 07/31/2011  . Cardiovascular system disease 07/02/2011  . Hemorrhoid 07/02/2011  . Osteoporosis 07/02/2011  . Essential hypertension   . Hyperlipidemia   . IBS (irritable bowel syndrome)   . DM (diabetes mellitus) ([place])   . Calcification of cartilage   . BPPV (benign paroxysmal positional vertigo)   . Vision loss of right eye     Orientation RESPIRATION BLADDER Height & Weight        Normal Incontinent Weight: 110 lb (49.896 kg) Height:  5\' 2"  (157.5 cm)  BEHAVIORAL SYMPTOMS/MOOD NEUROLOGICAL BOWEL NUTRITION STATUS      Continent Diet  AMBULATORY STATUS COMMUNICATION OF NEEDS Skin   Limited Assist Verbally Surgical wounds                       Personal Care Assistance Level of Assistance  Bathing, Dressing Bathing Assistance: Limited assistance   Dressing Assistance: Limited assistance     Functional Limitations Info             SPECIAL CARE FACTORS FREQUENCY  PT (By licensed PT), OT (By licensed OT)     PT Frequency: 5/wk OT Frequency: 5/wk  Contractures      Additional Factors Info  Code Status               Current Medications (10/02/2015):  This is the current hospital active medication list Current Facility-Administered Medications  Medication Dose Route Frequency Provider Last Rate Last Dose  . acetaminophen (TYLENOL) tablet 1,000 mg  1,000 mg Oral Q8H PRN Shanker Kristeen Mans, MD      . albuterol (PROVENTIL) (2.5 MG/3ML) 0.083% nebulizer solution 2.5 mg  2.5  mg Nebulization Q2H PRN Shanker Kristeen Mans, MD      . alum & mag hydroxide-simeth (MAALOX/MYLANTA) 200-200-20 MG/5ML suspension 30 mL  30 mL Oral Q6H PRN Jonetta Osgood, MD      . amoxicillin-clavulanate (AUGMENTIN) 875-125 MG per tablet 1 tablet  1 tablet Oral Q12H Bonnielee Haff, MD      . antiseptic oral rinse (CPC / CETYLPYRIDINIUM CHLORIDE 0.05%) solution 7 mL  7 mL Mouth Rinse BID Bonnielee Haff, MD   7 mL at 10/01/15 2054  . aspirin EC tablet 325 mg  325 mg Oral Daily Jonetta Osgood, MD   325 mg at 10/02/15 0951  . atorvastatin (LIPITOR) tablet 10 mg  10 mg Oral q1800 Jonetta Osgood, MD   10 mg at 10/01/15 1745  . bisacodyl (DULCOLAX) suppository 10 mg  10 mg Rectal Daily PRN Jonetta Osgood, MD      . bisacodyl (DULCOLAX) suppository 10 mg  10 mg Rectal Once Bonnielee Haff, MD      . donepezil (ARICEPT) tablet 10 mg  10 mg Oral QHS Jonetta Osgood, MD   10 mg at 10/01/15 2053  . enoxaparin (LOVENOX) injection 40 mg  40 mg Subcutaneous Q24H Jonetta Osgood, MD   40 mg at 09/30/15 2019  . ferrous sulfate tablet 325 mg  325 mg Oral Q breakfast Jonetta Osgood, MD   325 mg at 10/02/15 0851  . fluticasone (FLONASE) 50 MCG/ACT nasal spray 2 spray  2 spray Each Nare Daily Jonetta Osgood, MD   2 spray at 10/02/15 [phone]  . food thickener (THICK IT) powder   Oral PRN Bonnielee Haff, MD      . hydrALAZINE (APRESOLINE) injection 5 mg  5 mg Intravenous Q6H PRN Bonnielee Haff, MD   5 mg at 10/01/15 1408  . ipratropium-albuterol (DUONEB) 0.5-2.5 (3) MG/3ML nebulizer solution 3 mL  3 mL Nebulization Q6H Bonnielee Haff, MD      . lactose free nutrition (Boost) liquid 237 mL  237 mL Oral BID BM Bonnielee Haff, MD      . lisinopril (PRINIVIL,ZESTRIL) tablet 10 mg  10 mg Oral Daily Jonetta Osgood, MD   10 mg at 10/02/15 0951  . lisinopril (PRINIVIL,ZESTRIL) tablet 5 mg  5 mg Oral QPM Bonnielee Haff, MD      . loratadine (CLARITIN) tablet 10 mg  10 mg Oral Daily Jonetta Osgood, MD   10  mg at 10/02/15 0951  . ondansetron (ZOFRAN) tablet 4 mg  4 mg Oral Q6H PRN Shanker Kristeen Mans, MD       Or  . ondansetron [facility]) injection 4 mg  4 mg Intravenous Q6H PRN Shanker Kristeen Mans, MD      . oxymetazoline (AFRIN) 0.05 % nasal spray 1 spray  1 spray Each Nare BID Jonetta Osgood, MD   1 spray at 10/02/15 [phone]  . polyethylene glycol (MIRALAX / GLYCOLAX) packet 17 g  17 g Oral BID Bonnielee Haff, MD  17 g at 10/02/15 1246  . polyvinyl alcohol (LIQUIFILM TEARS) 1.4 % ophthalmic solution 1 drop  1 drop Both Eyes Daily PRN Shanker Kristeen Mans, MD      . saccharomyces boulardii (FLORASTOR) capsule 250 mg  250 mg Oral BID Jonetta Osgood, MD   250 mg at 10/02/15 [id number]     Discharge Medications: Please see discharge summary for a list of discharge medications.  Relevant Imaging Results:  Relevant Lab Results:   Additional Information SS # [id number]   Cranford Mon, [place]

## 2015-10-02 NOTE — Progress Notes (Signed)
Initial Nutrition Assessment  DOCUMENTATION CODES:   Non-severe (moderate) malnutrition in context of chronic illness  INTERVENTION:   -Increase Boost order to BID, each provides 240 kcal and 10 g protein. -Provide daily snack -Encourage PO intake -RD to continue to monitor  NUTRITION DIAGNOSIS:   Malnutrition related to chronic illness as evidenced by moderate depletion of body fat, moderate depletions of muscle mass.  GOAL:   Patient will meet greater than or equal to 90% of their needs  MONITOR:   PO intake, Supplement acceptance, Labs, Weight trends, I & O's  REASON FOR ASSESSMENT:   Consult Assessment of nutrition requirement/status  ASSESSMENT:   82 year old Caucasian female with a past medical history of some dementia, complete heart block status post pacemaker, hypertension, failure to thrive, presented with generalized weakness, cough, difficulty breathing. She was found to be hypoxic.  Patient in room with no family at bedside. Pt was able to give limited information. Pt reports not being sure if she has eaten today and did not know how well she is eating. She had a Boost Original supplement at bedside and states that she plans to drink it for dinner (however it appears she has been sipping on it). Pt was agreeable to receiving an additional supplement in the day and pudding for a daily snack. RD to order. SLP evaluated on 5/6, recommended Dysphagia 3 diet with nectar thick liquid d/t mild aspiration risk. Pt with thickener at bedside. Pt currently consuming 50-90% of meals per chart review. Weight has remained stable per chart.  Nutrition-Focused physical exam completed. Findings are moderate fat depletion, moderate muscle depletion, and no edema.   Medications: Ferrous Sulfate tablet daily, KCl solution once Labs reviewed: Elevated Na Low K  Diet Order:  DIET DYS 3 Room service appropriate?: Yes; Fluid consistency:: Nectar Thick  Skin:  Wound (see comment)  (thigh wound)  Last BM:  5/4  Height:   Ht Readings from Last 1 Encounters:  09/30/15 5\' 2"  (1.575 m)    Weight:   Wt Readings from Last 1 Encounters:  09/30/15 110 lb (49.896 kg)    Ideal Body Weight:  50 kg  BMI:  Body mass index is 20.11 kg/(m^2).  Estimated Nutritional Needs:   Kcal:  1250-1450  Protein:  65-75g  Fluid:  1.5L/day  EDUCATION NEEDS:   No education needs identified at this time  Clayton Bibles, MS, RD, LDN Pager: [phone] After Hours Pager: [phone]

## 2015-10-02 NOTE — Progress Notes (Signed)
[facility] PROGRESS NOTE  Briana Thornton [id number] DOB: 14-Aug-1926 DOA: 09/30/2015  PCP: Penni Homans, MD  Brief HPI: 82 year old Caucasian female with a past medical history of some dementia, complete heart block status post pacemaker, hypertension, failure to thrive, presented with generalized weakness, cough, difficulty breathing. She was found to be hypoxic. She was hospitalized for further management.  Past medical history:  Past Medical History  Diagnosis Date  . Chicken pox as a child  . Measles as a child  . IBS (irritable bowel syndrome)   . Diabetes mellitus 47    type 2  . Hyperlipidemia 72  . Hypertension 72  . Vision loss of right eye   . Vertigo 2010    benign  . Calcification of cartilage     ear  . Cardiovascular system disease 07/02/2011  . Hemorrhoid 07/02/2011  . Osteoporosis 07/02/2011  . Weight loss 07/31/2011  . Anemia 05/22/2012  . Broken wrist 10-22-11    left  . Depression 05/22/2012  . Dermatitis 05/22/2012    Right neck  . DM type 2 with diabetic peripheral neuropathy ([place]) 05/18/2015  . Unsteady gait   . Generalized weakness   . Closed intertrochanteric fracture of left hip ([place])   . Enterococcus UTI   . Dysphagia   . Postoperative anemia due to acute blood loss   . Malnutrition of moderate degree ([place])   . Benign essential HTN   . HLD (hyperlipidemia)   . Constipation     Consultants: None  Procedures: None  Antibiotics: Unasyn  Subjective: Patient remains pleasantly confused. She is feeling better today compared to yesterday. Denies any shortness of breath. No chest pains.   Objective:  Vital Signs  Filed Vitals:   10/01/15 1918 10/01/15 2055 10/02/15 0504 10/02/15 0821  BP:  160/46 173/59   Pulse:  75 59   Temp:  98 F (36.7 C) 98.1 F (36.7 C)   TempSrc:  Oral Oral   Resp:  18 18   Height:      Weight:      SpO2: 96% 95% 94% 94%    Intake/Output Summary (Last 24 hours) at 10/02/15 1014 Last data filed at  10/02/15 0900  Gross per 24 hour  Intake    900 ml  Output    325 ml  Net    575 ml   Filed Weights   09/30/15 0952  Weight: 49.896 kg (110 lb)    General appearance: alert, cooperative, appears stated age and no distress Resp: Coarse sounds bilaterally with a few crackles at the right base. Scattered end expiratory wheezes heard today. No rhonchi.  Cardio: regular rate and rhythm, S1, S2 normal, no murmur, click, rub or gallop GI: soft, non-tender; bowel sounds normal; no masses,  no organomegaly Neurologic: Awake and alert. Pleasantly confused. No focal deficits.  Lab Results:  Data Reviewed: I have personally reviewed following labs and imaging studies  CBC:  Recent Labs Lab 09/25/15 2125 09/30/15 1240 10/01/15 0429 10/02/15 0420  WBC 8.0 11.6* 7.7 7.4  NEUTROABS 6.0 8.8*  --   --   HGB 12.0 12.3 10.7* 10.4*  HCT 38.1 37.3 33.9* 32.2*  MCV 91.4 86.9 89.9 89.4  PLT 191 236 227 0000000   Basic Metabolic Panel:  Recent Labs Lab 09/25/15 2125 09/30/15 1240 10/01/15 0429 10/02/15 0420  NA 144 145 146* 146*  K 3.9 4.3 3.4* 3.4*  CL 110 110 114* 113*  CO2 25 26 25 24   GLUCOSE 114* 106*  92 102*  BUN 37* 32* 31* 25*  CREATININE 1.07* 0.95 0.85 0.77  CALCIUM 9.3 9.0 8.7* 8.5*   GFR: Estimated Creatinine Clearance: 39 mL/min (by C-G formula based on Cr of 0.77).  Liver Function Tests:  Recent Labs Lab 09/25/15 2125  AST 18  ALT 14  ALKPHOS 63  BILITOT 1.0  PROT 6.5  ALBUMIN 3.6   Cardiac Enzymes:  Recent Labs Lab 09/25/15 2125 09/30/15 1240  TROPONINI <0.03 <0.03   Urine analysis:    Component Value Date/Time   COLORURINE YELLOW 09/30/2015 [place] 09/30/2015 1616   LABSPEC 1.027 09/30/2015 1616   PHURINE 6.0 09/30/2015 1616   GLUCOSEU NEGATIVE 09/30/2015 1616   GLUCOSEU NEGATIVE 04/26/2015 1155   HGBUR TRACE* 09/30/2015 1616   BILIRUBINUR SMALL* 09/30/2015 1616   BILIRUBINUR Neg 07/26/2015 1152   [place]  09/30/2015 1616   PROTEINUR 30* 09/30/2015 1616   PROTEINUR Small 07/26/2015 1152   UROBILINOGEN 2.0 07/26/2015 1152   UROBILINOGEN 1.0 04/26/2015 1155   NITRITE NEGATIVE 09/30/2015 1616   NITRITE Neg 07/26/2015 1152   LEUKOCYTESUR SMALL* 09/30/2015 1616    Recent Results (from the past 240 hour(s))  Respiratory Panel by PCR     Status: Abnormal   Collection Time: 09/30/15  9:28 PM  Result Value Ref Range Status   Adenovirus NOT DETECTED NOT DETECTED Final   Coronavirus 229E NOT DETECTED NOT DETECTED Final   Coronavirus HKU1 NOT DETECTED NOT DETECTED Final   Coronavirus NL63 NOT DETECTED NOT DETECTED Final   Coronavirus OC43 NOT DETECTED NOT DETECTED Final   Metapneumovirus DETECTED (A) NOT DETECTED Final   Rhinovirus / Enterovirus NOT DETECTED NOT DETECTED Final   Influenza A NOT DETECTED NOT DETECTED Final   Influenza A H1 NOT DETECTED NOT DETECTED Final   Influenza A H1 2009 NOT DETECTED NOT DETECTED Final   Influenza A H3 NOT DETECTED NOT DETECTED Final   Influenza B NOT DETECTED NOT DETECTED Final   Parainfluenza Virus 1 NOT DETECTED NOT DETECTED Final   Parainfluenza Virus 2 NOT DETECTED NOT DETECTED Final   Parainfluenza Virus 3 NOT DETECTED NOT DETECTED Final   Parainfluenza Virus 4 NOT DETECTED NOT DETECTED Final   Respiratory Syncytial Virus NOT DETECTED NOT DETECTED Final   Bordetella pertussis NOT DETECTED NOT DETECTED Final   Chlamydophila pneumoniae NOT DETECTED NOT DETECTED Final   Mycoplasma pneumoniae NOT DETECTED NOT DETECTED Final    Comment: Performed at [facility]      Radiology Studies: Dg Chest 2 View  10/01/2015  CLINICAL DATA:  Cough. EXAM: CHEST  2 VIEW COMPARISON:  Sep 30, 2015 FINDINGS: Stable pacemaker. No acute abnormalities are seen to explain the patient's cough. Calcifications are associated with 1 of the cardiac valves. A calcified nodule is seen posteriorly on the lateral view, unchanged. IMPRESSION: No interval change or acute  abnormality. Electronically Signed   By: Dorise Bullion III M.D   On: 10/01/2015 14:02   Dg Chest 2 View  09/30/2015  CLINICAL DATA:  Productive cough with shortness breath and weakness. EXAM: CHEST  2 VIEW COMPARISON:  09/25/2015 FINDINGS: Mild cardiomegaly. Left pacer remains in place, unchanged. No confluent airspace opacities, effusions or edema. No acute bony abnormality. IMPRESSION: Cardiomegaly.  No active disease. Electronically Signed   By: Rolm Baptise M.D.   On: 09/30/2015 10:28   Nm Pulmonary Perf And Vent  09/30/2015  CLINICAL DATA:  Shortness of breath, generalized weakness, cough and congestion. Evaluate for pulmonary  embolism. EXAM: NUCLEAR MEDICINE VENTILATION - PERFUSION LUNG SCAN TECHNIQUE: Ventilation images were obtained in multiple projections using inhaled aerosol Tc-47m DTPA. Perfusion images were obtained in multiple projections after intravenous injection of Tc-81m MAA. RADIOPHARMACEUTICALS:  31.5 mCi Technetium-38m DTPA aerosol inhalation and 4.3 mCi Technetium-65m MAA IV COMPARISON:  Chest radiograph - earlier same day ; 09/25/2015 FINDINGS: Review of chest radiograph performed earlier today demonstrates grossly unchanged enlarged cardiac silhouette and mediastinal contours with exuberant calcifications about the mitral valve annulus. Left anterior chest wall dual lead pacemaker. The lungs are hyperexpanded. Bilateral infrahilar opacities favored to represent atelectasis. No pleural effusion or pneumothorax. No evidence of edema. Ventilation: There is minimal clumping of inhaled radiotracer about the bilateral pulmonary hila. A minimal amount of ingested radiotracer is seen within the hypopharynx, superior aspect of the esophagus and stomach. Perfusion: There is relative homogeneous distribution of injected radiotracer throughout the bilateral pulmonary parenchyma without discrete segmental or subsegmental mismatched filling defect to suggest pulmonary embolism. IMPRESSION: Pulmonary  embolism absent (very low probability of pulmonary embolism). Electronically Signed   By: Sandi Mariscal M.D.   On: 09/30/2015 16:00     Medications:  Scheduled: . ampicillin-sulbactam (UNASYN) IV  3 g Intravenous Q8H  . antiseptic oral rinse  7 mL Mouth Rinse BID  . aspirin EC  325 mg Oral Daily  . atorvastatin  10 mg Oral q1800  . donepezil  10 mg Oral QHS  . enoxaparin (LOVENOX) injection  40 mg Subcutaneous Q24H  . ferrous sulfate  325 mg Oral Q breakfast  . fluticasone  2 spray Each Nare Daily  . ipratropium-albuterol  3 mL Nebulization BID  . lactose free nutrition  237 mL Oral Daily  . lisinopril  10 mg Oral Daily  . loratadine  10 mg Oral Daily  . oxymetazoline  1 spray Each Nare BID  . saccharomyces boulardii  250 mg Oral BID   Continuous:  [id number], albuterol, alum & mag hydroxide-simeth, bisacodyl, food thickener, hydrALAZINE, ondansetron **OR** ondansetron (ZOFRAN) IV, polyethylene glycol, polyvinyl alcohol  Assessment/Plan:  Principal Problem:   PNA (pneumonia) Active Problems:   Essential hypertension   DM (diabetes mellitus) (HCC)   Pacemaker-St.Jude   Dysphagia   Acute respiratory failure with hypoxia (HCC)   Dementia   H/O: CVA (cerebrovascular accident)   UTI (lower urinary tract infection)    Acute respiratory failure with hypoxia secondary to acute viral syndrome with Metapneumovirus Respiratory viral panel is positive for Metapneumovirus. This would explain patient's symptoms. Supportive treatment to continue. Patient has already clinically improved. Some wheezing is noted today. History of passive smoking in the past. Combivent. VQ scan was low probability for PE. Initial chest x-ray did not show any clear infiltrate. Chest x-ray was repeated and again did not show any infiltrates. She was seen by speech pathology. She will remain on dysphagia 3 diet. Lower extremity venous Doppler is negative for DVT. Change to oral antibiotics today. Continue  treatment for sinus congestion.   Questionable UTI Given dementia very hard to discern if she actually has symptoms. UA is equivocal. Continue current antibiotics.   Suspected Aspiration PNA See above  Essential hypertension Blood pressure remains elevated. She is noted to be only on lisinopril at home. Discussed with her daughter. She was started on lisinopril about 2 months ago. Blood pressure is noted to be elevated most of the time. Could go up on dose of lisinopril. Would not want too aggressive a control in this elderly patient.  History of CVA Continue  aspirin and statin. Nonfocal exam.  History of dysphagia On dysphagia 3 diet  Dementia Continue Aricept. Currently pleasantly confused and close to her baseline.  Failure to thrive syndrome/Generalized weakness Chronic issue-probably worsened due to acute illness. PT/OT evaluation.   History of complete heart block status post pacemaker placement  Stable  DVT Prophylaxis: Lovenox    Code Status: DO NOT RESUSCITATE  Family Communication: No family at bedside. Discussed with daughter. Disposition Plan: Continue management as outlined. PT recommends SNF. Discussed with daughter. She would like the same. Social worker consulted. Anticipate discharge tomorrow.    LOS: 2 days   [facility] Pager [phone] 10/02/2015, 10:14 AM  If 7PM-7AM, please contact night-coverage at [email or web address], password [facility]

## 2015-10-03 DIAGNOSIS — J208 Acute bronchitis due to other specified organisms: Principal | ICD-10-CM

## 2015-10-03 LAB — BASIC METABOLIC PANEL
Anion gap: 8 (ref 5–15)
BUN: 25 mg/dL — ABNORMAL HIGH (ref 6–20)
CALCIUM: 8.7 mg/dL — AB (ref 8.9–10.3)
CO2: 24 mmol/L (ref 22–32)
CREATININE: 0.96 mg/dL (ref 0.44–1.00)
Chloride: 113 mmol/L — ABNORMAL HIGH (ref 101–111)
GFR calc non Af Amer: 52 mL/min — ABNORMAL LOW (ref >60)
GFR, EST AFRICAN AMERICAN: 60 mL/min — AB (ref >60)
Glucose, Bld: 122 mg/dL — ABNORMAL HIGH (ref 65–99)
Potassium: 4.1 mmol/L (ref 3.5–5.1)
SODIUM: 145 mmol/L (ref 135–145)

## 2015-10-03 MED ORDER — FLUTICASONE PROPIONATE 50 MCG/ACT NA SUSP: 2.0000 | Freq: Every day | NASAL | Status: DC

## 2015-10-03 MED ORDER — BISACODYL 10 MG RE SUPP: 10.0000 mg | Freq: Every day | RECTAL | Status: AC | PRN

## 2015-10-03 MED ORDER — SACCHAROMYCES BOULARDII 250 MG PO CAPS: 250.0000 mg | ORAL_CAPSULE | Freq: Two times a day (BID) | ORAL | Status: DC

## 2015-10-03 MED ORDER — POLYETHYLENE GLYCOL 3350 17 G PO PACK: 17.0000 g | PACK | Freq: Every day | ORAL | Status: DC

## 2015-10-03 MED ORDER — LISINOPRIL 5 MG PO TABS: 5.0000 mg | ORAL_TABLET | Freq: Every evening | ORAL | Status: DC

## 2015-10-03 MED ORDER — ALBUTEROL SULFATE (2.5 MG/3ML) 0.083% IN NEBU: 2.5000 mg | INHALATION_SOLUTION | RESPIRATORY_TRACT | Status: DC | PRN

## 2015-10-03 MED ORDER — LORATADINE 10 MG PO TABS: 10.0000 mg | ORAL_TABLET | Freq: Every day | ORAL | Status: AC

## 2015-10-03 MED ORDER — IPRATROPIUM-ALBUTEROL 20-100 MCG/ACT IN AERS: 2.0000 | INHALATION_SPRAY | Freq: Four times a day (QID) | RESPIRATORY_TRACT | Status: AC

## 2015-10-03 MED ORDER — AMOXICILLIN-POT CLAVULANATE 875-125 MG PO TABS: 1.0000 | ORAL_TABLET | Freq: Two times a day (BID) | ORAL | Status: DC

## 2015-10-03 NOTE — Clinical Social Work Placement (Signed)
   CLINICAL SOCIAL WORK PLACEMENT  NOTE  Date:  10/03/2015  Patient Details  Name: Briana Thornton MRN: [id number] Date of Birth: 1927/05/03  Clinical Social Work is seeking post-discharge placement for this patient at the [place] level of care (*CSW will initial, date and re-position this form in  chart as items are completed):  Yes   Patient/family provided with [facility] Department's list of facilities offering this level of care within the geographic area requested by the patient (or if unable, by the patient's family).  Yes   Patient/family informed of their freedom to choose among providers that offer the needed level of care, that participate in Medicare, Medicaid or managed care program needed by the patient, have an available bed and are willing to accept the patient.  Yes   Patient/family informed of [facility]'s ownership interest in [facility] and [facility], as well as of the fact that they are under no obligation to receive care at these facilities.  PASRR submitted to EDS on       PASRR number received on       Existing PASRR number confirmed on 10/02/15     FL2 transmitted to all facilities in geographic area requested by pt/family on 10/02/15     FL2 transmitted to all facilities within larger geographic area on       Patient informed that his/her managed care company has contracts with or will negotiate with certain facilities, including the following:        Yes   Patient/family informed of bed offers received.  Patient chooses bed at [facility]     Physician recommends and patient chooses bed at      Patient to be transferred to [facility] on 10/03/15.  Patient to be transferred to facility by [facility]     Patient family notified on 10/03/15 of transfer.  Name of family member notified:  DAUGHTER     PHYSICIAN Please sign FL2, Please sign DNR     Additional Comment: Pt /  daughter are in agreement with d/c to [facility] today. [facility] transport required. Daughter is aware out of pocket costs may be associated with [facility] transport. D/C Summary sent to SNF for review prior to d/c. No scripts needed. # for report provided to nsg.   _______________________________________________ Luretha Rued, [place] 10/03/2015, 4:24 PM

## 2015-10-03 NOTE — Care Management Important Message (Signed)
Important Message  Patient Details  Name: Briana Thornton MRN: [id number] Date of Birth: 1927/01/06   Medicare Important Message Given:  Yes    [name] 10/03/2015, 11:15 AMImportant Message  Patient Details  Name: Briana Thornton MRN: [id number] Date of Birth: 02/18/1927   Medicare Important Message Given:  Yes    [name] 10/03/2015, 11:15 AM

## 2015-10-03 NOTE — Discharge Instructions (Signed)

## 2015-10-03 NOTE — Discharge Summary (Signed)
[facility]  Physician Discharge Summary   Patient ID: Briana Thornton Postal MRN: [id number] DOB/AGE: 80-Jan-1928 80 y.o.  Admit date: 09/30/2015 Discharge date: 10/03/2015  PCP: Penni Homans, MD  DISCHARGE DIAGNOSES:  Principal Problem:   Acute bronchitis due to human metapneumovirus Active Problems:   Essential hypertension   DM (diabetes mellitus) ([place])   Pacemaker-[employer]ude   Dysphagia   Acute respiratory failure with hypoxia (HCC)   Dementia   H/O: CVA (cerebrovascular accident)   UTI (lower urinary tract infection)   RECOMMENDATIONS FOR OUTPATIENT FOLLOW UP: 1. CBC in 2 weeks  DISCHARGE CONDITION: fair  Diet recommendation: Dysphagia 3 diet with nectar thick liquids.  Filed Weights   09/30/15 0952  Weight: 49.896 kg (110 lb)    INITIAL HISTORY: 82 year old Caucasian female with a past medical history of some dementia, complete heart block status post pacemaker, hypertension, failure to thrive, presented with generalized weakness, cough, difficulty breathing. She was found to be hypoxic. She was hospitalized for further management.   HOSPITAL COURSE:   Acute respiratory failure with hypoxia secondary to acute viral syndrome with Metapneumovirus Patient was admitted to the hospital with above-mentioned symptoms. She was started on nebulizer treatments along with treatment for sinus congestion. Respiratory viral panel is positive for Metapneumovirus. This would explain patient's symptoms. Patient has clinically improved. She is afebrile. She should continue on Combivent inhaler for now. Wheezing is improved. VQ scan was low probability for PE. Initial chest x-ray did not show any clear infiltrate. Chest x-ray was repeated and again did not show any infiltrates. She was seen by speech pathology. She will remain on dysphagia 3 diet. Lower extremity venous Doppler is negative for DVT. She was initially started on Unasyn. Changed over to Augmentin, which we'll continue for 4  more days. Oxygenation is normal.  Questionable UTI Given dementia very hard to discern if she actually has symptoms. UA is equivocal. Continue current antibiotics.   Suspected Aspiration PNA See above. No infiltrates identified on 2 different chest x-rays.  Essential hypertension Blood pressure was noted to be significantly elevated. She is noted to be only on lisinopril at home. Discussed with her daughter. She was started on lisinopril about 2 months ago. Blood pressure is noted to be elevated most of the time. Added an evening does of lisinopril as well. Will not make any further changes at this time. Would not want too aggressive a control in this elderly patient.  History of CVA Continue aspirin and statin. Nonfocal exam.  History of dysphagia On dysphagia 3 diet  Dementia Continue Aricept. Currently close to her baseline.  Failure to thrive syndrome/Generalized weakness Chronic issue-probably worsened due to acute illness. PT/OT evaluation.   History of complete heart block status post pacemaker placement  Stable  Constipation Patient was given a suppository with good response. MiraLAX daily. Stool softeners.  Normocytic anemia Daughter attributes constipation to iron tablets. Since her hemoglobin is improved she requests that we stop this medication. Will discontinue iron. Recommend repeating CBC in 2 weeks.  Overall much improved. Patient was seen by physical therapy. Home health versus SNF was offered. Family feels that she will benefit from short-term rehabilitation. She will be discharged to skilled nursing facility today.    PERTINENT LABS:  The results of significant diagnostics from this hospitalization (including imaging, microbiology, ancillary and laboratory) are listed below for reference.    Microbiology: Recent Results (from the past 240 hour(s))  Respiratory Panel by PCR     Status: Abnormal   Collection Time:  09/30/15  9:28 PM  Result Value Ref  Range Status   Adenovirus NOT DETECTED NOT DETECTED Final   Coronavirus 229E NOT DETECTED NOT DETECTED Final   Coronavirus HKU1 NOT DETECTED NOT DETECTED Final   Coronavirus NL63 NOT DETECTED NOT DETECTED Final   Coronavirus OC43 NOT DETECTED NOT DETECTED Final   Metapneumovirus DETECTED (A) NOT DETECTED Final   Rhinovirus / Enterovirus NOT DETECTED NOT DETECTED Final   Influenza A NOT DETECTED NOT DETECTED Final   Influenza A H1 NOT DETECTED NOT DETECTED Final   Influenza A H1 2009 NOT DETECTED NOT DETECTED Final   Influenza A H3 NOT DETECTED NOT DETECTED Final   Influenza B NOT DETECTED NOT DETECTED Final   Parainfluenza Virus 1 NOT DETECTED NOT DETECTED Final   Parainfluenza Virus 2 NOT DETECTED NOT DETECTED Final   Parainfluenza Virus 3 NOT DETECTED NOT DETECTED Final   Parainfluenza Virus 4 NOT DETECTED NOT DETECTED Final   Respiratory Syncytial Virus NOT DETECTED NOT DETECTED Final   Bordetella pertussis NOT DETECTED NOT DETECTED Final   Chlamydophila pneumoniae NOT DETECTED NOT DETECTED Final   Mycoplasma pneumoniae NOT DETECTED NOT DETECTED Final    Comment: Performed at [facility]: Basic Metabolic Panel:  Recent Labs Lab 09/30/15 1240 10/01/15 0429 10/02/15 0420 10/03/15 0418  NA 145 146* 146* 145  K 4.3 3.4* 3.4* 4.1  CL 110 114* 113* 113*  CO2 26 25 24 24   GLUCOSE 106* 92 102* 122*  BUN 32* 31* 25* 25*  CREATININE 0.95 0.85 0.77 0.96  CALCIUM 9.0 8.7* 8.5* 8.7*   CBC:  Recent Labs Lab 09/30/15 1240 10/01/15 0429 10/02/15 0420  WBC 11.6* 7.7 7.4  NEUTROABS 8.8*  --   --   HGB 12.3 10.7* 10.4*  HCT 37.3 33.9* 32.2*  MCV 86.9 89.9 89.4  PLT 236 227 233   Cardiac Enzymes:  Recent Labs Lab 09/30/15 1240  TROPONINI <0.03   BNP: BNP (last 3 results)  Recent Labs  09/30/15 1240  BNP 121.5*    IMAGING STUDIES Dg Chest 2 View  10/01/2015  CLINICAL DATA:  Cough. EXAM: CHEST  2 VIEW COMPARISON:  Sep 30, 2015 FINDINGS: Stable  pacemaker. No acute abnormalities are seen to explain the patient's cough. Calcifications are associated with 1 of the cardiac valves. A calcified nodule is seen posteriorly on the lateral view, unchanged. IMPRESSION: No interval change or acute abnormality. Electronically Signed   By: Dorise Bullion III M.D   On: 10/01/2015 14:02   Dg Chest 2 View  09/30/2015  CLINICAL DATA:  Productive cough with shortness breath and weakness. EXAM: CHEST  2 VIEW COMPARISON:  09/25/2015 FINDINGS: Mild cardiomegaly. Left pacer remains in place, unchanged. No confluent airspace opacities, effusions or edema. No acute bony abnormality. IMPRESSION: Cardiomegaly.  No active disease. Electronically Signed   By: Rolm Baptise M.D.   On: 09/30/2015 10:28   Nm Pulmonary Perf And Vent  09/30/2015  CLINICAL DATA:  Shortness of breath, generalized weakness, cough and congestion. Evaluate for pulmonary embolism. EXAM: NUCLEAR MEDICINE VENTILATION - PERFUSION LUNG SCAN TECHNIQUE: Ventilation images were obtained in multiple projections using inhaled aerosol Tc-19m DTPA. Perfusion images were obtained in multiple projections after intravenous injection of Tc-22m MAA. RADIOPHARMACEUTICALS:  31.5 mCi Technetium-7m DTPA aerosol inhalation and 4.3 mCi Technetium-62m MAA IV COMPARISON:  Chest radiograph - earlier same day ; 09/25/2015 FINDINGS: Review of chest radiograph performed earlier today demonstrates grossly unchanged enlarged cardiac silhouette and mediastinal  contours with exuberant calcifications about the mitral valve annulus. Left anterior chest wall dual lead pacemaker. The lungs are hyperexpanded. Bilateral infrahilar opacities favored to represent atelectasis. No pleural effusion or pneumothorax. No evidence of edema. Ventilation: There is minimal clumping of inhaled radiotracer about the bilateral pulmonary hila. A minimal amount of ingested radiotracer is seen within the hypopharynx, superior aspect of the esophagus and stomach.  Perfusion: There is relative homogeneous distribution of injected radiotracer throughout the bilateral pulmonary parenchyma without discrete segmental or subsegmental mismatched filling defect to suggest pulmonary embolism. IMPRESSION: Pulmonary embolism absent (very low probability of pulmonary embolism). Electronically Signed   By: Sandi Mariscal M.D.   On: 09/30/2015 16:00    DISCHARGE EXAMINATION: Filed Vitals:   10/02/15 1400 10/02/15 1534 10/02/15 2139 10/03/15 0510  BP: 152/54  165/63 166/64  Pulse: 66  76 72  Temp: 98.8 F (37.1 C)  98.9 F (37.2 C) 98.6 F (37 C)  TempSrc: Oral  Oral Oral  Resp:   16 16  Height:      Weight:      SpO2: 95% 98% 97% 98%   General appearance: alert, cooperative, appears stated age and no distress Resp: Coarse breath sounds bilaterally. No wheezing heard today. No crackles. Cardio: regular rate and rhythm, S1, S2 normal, no murmur, click, rub or gallop GI: soft, non-tender; bowel sounds normal; no masses,  no organomegaly Extremities: extremities normal, atraumatic, no cyanosis or edema   DISPOSITION: SNF  Discharge Instructions    Call MD for:  difficulty breathing, headache or visual disturbances    Complete by:  As directed      Call MD for:  extreme fatigue    Complete by:  As directed      Call MD for:  persistant dizziness or light-headedness    Complete by:  As directed      Call MD for:  persistant nausea and vomiting    Complete by:  As directed      Call MD for:  severe uncontrolled pain    Complete by:  As directed      Call MD for:  temperature >100.4    Complete by:  As directed      Discharge diet:    Complete by:  As directed   Dysphagia 3 diet with nectar thick liquids     Discharge instructions    Complete by:  As directed   Please check CBC in 2 weeks.  You were cared for by a hospitalist during your hospital stay. If you have any questions about your discharge medications or the care you received while you were in  the hospital after you are discharged, you can call the unit and asked to speak with the hospitalist on call if the hospitalist that took care of you is not available. Once you are discharged, your primary care physician will handle any further medical issues. Please note that NO REFILLS for any discharge medications will be authorized once you are discharged, as it is imperative that you return to your primary care physician (or establish a relationship with a primary care physician if you do not have one) for your aftercare needs so that they can reassess your need for medications and monitor your lab values. If you do not have a primary care physician, you can call [phone] for a physician referral.     Increase activity slowly    Complete by:  As directed  ALLERGIES: No Known Allergies   Current Discharge Medication List    START taking these medications   Details  albuterol (PROVENTIL) (2.5 MG/3ML) 0.083% nebulizer solution Take 3 mLs (2.5 mg total) by nebulization every 2 (two) hours as needed for wheezing or shortness of breath. Qty: 75 mL, Refills: 12    amoxicillin-clavulanate (AUGMENTIN) 875-125 MG tablet Take 1 tablet by mouth every 12 (twelve) hours. For 4 more days    bisacodyl (DULCOLAX) 10 MG suppository Place 1 suppository (10 mg total) rectally daily as needed for moderate constipation. Qty: 12 suppository, Refills: 0    fluticasone (FLONASE) 50 MCG/ACT nasal spray Place 2 sprays into both nostrils daily. Refills: 2    !! lisinopril (PRINIVIL,ZESTRIL) 5 MG tablet Take 1 tablet (5 mg total) by mouth every evening.    loratadine (CLARITIN) 10 MG tablet Take 1 tablet (10 mg total) by mouth daily.    saccharomyces boulardii (FLORASTOR) 250 MG capsule Take 1 capsule (250 mg total) by mouth 2 (two) times daily. For 5 more days.     !! - Potential duplicate medications found. Please discuss with provider.    CONTINUE these medications which have CHANGED    Details  Ipratropium-Albuterol (COMBIVENT RESPIMAT) 20-100 MCG/ACT AERS respimat Inhale 2 puffs into the lungs 4 (four) times daily.    polyethylene glycol (MIRALAX / GLYCOLAX) packet Take 17 g by mouth daily. Qty: 14 each, Refills: 0      CONTINUE these medications which have NOT CHANGED   Details  acetaminophen (TYLENOL) 500 MG tablet Take 1,000 mg by mouth every 8 (eight) hours as needed for mild pain, moderate pain or headache.     alum & mag hydroxide-simeth (MAALOX ADVANCED) 200-200-20 MG/5ML suspension Take 30 mLs by mouth every 6 (six) hours as needed for indigestion or heartburn.    aspirin EC 325 MG tablet Take 325 mg by mouth daily. For 30 days for prophylaxis.  Stop 07/21/15    atorvastatin (LIPITOR) 10 MG tablet TAKE 1 TABLET (10 MG TOTAL) BY MOUTH DAILY. Qty: 90 tablet, Refills: 3    docusate sodium (COLACE) 100 MG capsule Take 100 mg by mouth daily.     donepezil (ARICEPT) 10 MG tablet Take 1 tablet (10 mg total) by mouth at bedtime. Qty: 90 tablet, Refills: 3    lactose free nutrition (BOOST) LIQD Take 237 mLs by mouth daily.    !! lisinopril (PRINIVIL,ZESTRIL) 10 MG tablet Take 1 tablet (10 mg total) by mouth daily. Qty: 90 tablet, Refills: 3   Associated Diagnoses: Essential hypertension    methylcellulose (ARTIFICIAL TEARS) 1 % ophthalmic solution Place 1 drop into both eyes daily as needed (dry eyes).     Probiotic Product (PROBIOTIC DAILY PO) Take 1 capsule by mouth daily.      !! - Potential duplicate medications found. Please discuss with provider.    STOP taking these medications     amoxicillin (AMOXIL) 500 MG capsule      azithromycin (ZITHROMAX) 250 MG tablet      ferrous sulfate 325 (65 FE) MG tablet      ciprofloxacin (CIPRO) 250 MG tablet      traMADol (ULTRAM) 50 MG tablet        Follow-up Information    Follow up with Penni Homans, MD. Schedule an appointment as soon as possible for a visit in 3 weeks.   Specialty:  Family Medicine    Why:  post hospitalization follow up   Contact information:   [address] [facility]  [address] [place] [address] [phone]       TOTAL DISCHARGE TIME: 35 minutes  [facility] Pager [phone]  10/03/2015, 10:36 AM

## 2015-10-03 NOTE — Care Management Note (Signed)
Case Management Note  Patient Details  Name: Briana Thornton MRN: [id number] Date of Birth: December 26, 1926  Subjective/Objective:                    Action/Plan:d/c SNF.   Expected Discharge Date:                  Expected Discharge Plan:  Skilled Nursing Facility  In-House Referral:  Clinical Social Work  Discharge planning Services  CM Consult  Post Acute Care Choice:    Choice offered to:     DME Arranged:    DME Agency:     HH Arranged:    [place] Agency:     Status of Service:  Completed, signed off  Medicare Important Message Given:  Yes Date Medicare IM Given:    Medicare IM give by:    Date Additional Medicare IM Given:    Additional Medicare Important Message give by:     If discussed at [facility] of Stay Meetings, dates discussed:    Additional Comments:  Dessa Phi, RN 10/03/2015, 11:45 AM

## 2015-10-03 NOTE — Progress Notes (Signed)
RN called report to Kenney Houseman, Therapist, sports at [employer]. All questions answered.   Patient was transferred to SNF by [facility] at approximately 1330.

## 2015-10-03 NOTE — Progress Notes (Signed)
Speech Language Pathology Treatment: Dysphagia  Patient Details Name: Briana Thornton MRN: [id number] DOB: 1926-09-18 Today's Date: 10/03/2015 Time: [id number] SLP Time Calculation (min) (ACUTE ONLY): 13 min  Assessment / Plan / Recommendation Clinical Impression  Pt for D/C today.  Tolerating baseline diet of mechanical soft, nectar-thick liquids with adequate toleration.  Lungs diminished but clear; afebrile.  No overt s/s of aspiration.  Recommend continuing this diet upon D/C.  No SLP f/u needed.    HPI HPI: 82 y.o. female with medical history significant of dementia, history of complete heart block status post permanent pacemaker placement, hypertension, failure to thrive syndrome-who presents to the ED with approximately one-week history of cough and nasal congestion. Please note, patient is a poor historian, her daughter who is a Therapist, sports provides much of the history. Apparently, multiple members of the family had a upper respiratory tract infection and are currently taking antibiotics. Patient started having similar symptoms approximately a week back. Her symptoms mostly consist cough that is occasionally productive. She also has nasal and sinus congestion. Along with these symptoms, she started experiencing worsening generalized weakness (at baseline, patient needs assistance to walk-and only gets around the room). She was recently seen in the emergency room a few days back, and empirically started on amoxicillin and Zithromax, however her symptoms continued to worsen. This morning, she was found to be incredibly weak and her lips were noted to be cyanotic. A pulse oximeter at home showed her O2 saturation to be in the mid 70s      SLP Plan  Discharge SLP treatment due to (comment)     Recommendations  Diet recommendations: Dysphagia 3 (mechanical soft);Nectar-thick liquid Liquids provided via: Cup;Straw Medication Administration: Whole meds with puree Supervision: Patient able to self  feed Compensations: Slow rate;Small sips/bites Postural Changes and/or Swallow Maneuvers: Seated upright 90 degrees             Oral Care Recommendations: Oral care BID Plan: Discharge SLP treatment due to (comment)     GO                Briana Thornton 10/03/2015, 11:43 AM

## 2015-10-20 ENCOUNTER — Other Ambulatory Visit: Payer: Self-pay | Admitting: Family Medicine

## 2015-10-25 ENCOUNTER — Ambulatory Visit: Payer: Medicare Other | Admitting: Family Medicine

## 2015-11-16 ENCOUNTER — Telehealth: Payer: Self-pay | Admitting: *Deleted

## 2015-11-16 NOTE — Telephone Encounter (Signed)
FMLA forms received via fax from [employer] for pt's daughter.  Pt has not been seen by Dr. Charlett Blake since November 2016. Please call and schedule appt to have forms completed. Thanks.

## 2015-11-18 NOTE — Telephone Encounter (Signed)
Forms completed by Dr. Charlett Blake and faxed to [facility] successfully. Sent for scanning. JG//CMA

## 2015-12-02 NOTE — Telephone Encounter (Signed)
Pt's daughter called in. She acknowledge forms being faxed however she says that on the form the provider stated that she works 8 hour shifts. 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 She says that if possible that need to be updated . She provided  [employer] associate name and contact information to have updated.    [name] [phone]   Contact info for examiner Doroteo Bradford [phone] ext [phone]   Fax :  [phone]

## 2016-01-24 ENCOUNTER — Encounter: Payer: Self-pay | Admitting: Family Medicine

## 2016-01-26 ENCOUNTER — Encounter: Payer: Self-pay | Admitting: Family Medicine

## 2016-02-14 ENCOUNTER — Ambulatory Visit (INDEPENDENT_AMBULATORY_CARE_PROVIDER_SITE_OTHER): Payer: Medicare Other | Admitting: Internal Medicine

## 2016-02-14 ENCOUNTER — Encounter: Payer: Self-pay | Admitting: Internal Medicine

## 2016-02-14 VITALS — BP 118/50 | HR 80 | Ht 62.0 in | Wt 102.8 lb

## 2016-02-14 DIAGNOSIS — I495 Sick sinus syndrome: Secondary | ICD-10-CM

## 2016-02-14 DIAGNOSIS — I10 Essential (primary) hypertension: Secondary | ICD-10-CM

## 2016-02-14 LAB — CUP PACEART INCLINIC DEVICE CHECK
Implantable Lead Implant Date: 20100610
Implantable Lead Location: 753860
Lead Channel Pacing Threshold Amplitude: 0.5 V
Lead Channel Pacing Threshold Pulse Width: 0.4 ms
MDC IDC LEAD IMPLANT DT: 20100610
MDC IDC LEAD LOCATION: 753859
MDC IDC MSMT LEADCHNL RA PACING THRESHOLD PULSEWIDTH: 0.4 ms
MDC IDC MSMT LEADCHNL RA SENSING INTR AMPL: 1.8 mV
MDC IDC MSMT LEADCHNL RV PACING THRESHOLD AMPLITUDE: 0.75 V
MDC IDC SESS DTM: 20170919111419
Pulse Gen Model: 2210
Pulse Gen Serial Number: 2315100

## 2016-02-14 NOTE — Patient Instructions (Signed)
Medication Instructions:  Your physician recommends that you continue on your current medications as directed. Please refer to the Current Medication list given to you today.   Labwork: None ordered   Testing/Procedures: None ordered   Follow-Up: Your physician wants you to follow-up in: 6 months with device clinic and 12 months with Dr Knox Saliva will receive a reminder letter in the mail two months in advance. If you don't receive a letter, please call our office to schedule the follow-up appointment.   Any Other Special Instructions Will Be Listed Below (If Applicable).     If you need a refill on your cardiac medications before your next appointment, please call your pharmacy.

## 2016-02-14 NOTE — Progress Notes (Signed)
HPI [name] returns today for followup. She is a very pleasant elderly woman with a history of complete heart block, status post permanent pacemaker insertion, diabetes, and dyslipidemia. In the interim, she has had a hip fracture and pneumonia. No problems with rehab except she remains weak.  She denies chest pain or shortness of breath. No syncope.  No Known Allergies   Current Outpatient Prescriptions  Medication Sig Dispense Refill  . acetaminophen (TYLENOL) 500 MG tablet Take 1,000 mg by mouth every 8 (eight) hours as needed for mild pain, moderate pain or headache.     Marland Kitchen alum & mag hydroxide-simeth (MAALOX ADVANCED) 200-200-20 MG/5ML suspension Take 30 mLs by mouth every 6 (six) hours as needed for indigestion or heartburn.    Marland Kitchen atorvastatin (LIPITOR) 10 MG tablet TAKE 1 TABLET (10 MG TOTAL) BY MOUTH DAILY. 90 tablet 3  . bisacodyl (DULCOLAX) 10 MG suppository Place 1 suppository (10 mg total) rectally daily as needed for moderate constipation. 12 suppository 0  . docusate sodium (COLACE) 100 MG capsule Take 100 mg by mouth daily as needed (constipation).     Marland Kitchen donepezil (ARICEPT) 10 MG tablet TAKE 1 TABLET (10 MG TOTAL) BY MOUTH AT BEDTIME. 90 tablet 3  . fluticasone (FLONASE) 50 MCG/ACT nasal spray Place 2 sprays into both nostrils daily as needed for allergies or rhinitis.    . Ipratropium-Albuterol (COMBIVENT RESPIMAT) 20-100 MCG/ACT AERS respimat Inhale 2 puffs into the lungs 4 (four) times daily.    Marland Kitchen lactose free nutrition (BOOST) LIQD Take 237 mLs by mouth daily.    Marland Kitchen lisinopril (PRINIVIL,ZESTRIL) 10 MG tablet Take 1 tablet (10 mg total) by mouth daily. 90 tablet 3  . loratadine (CLARITIN) 10 MG tablet Take 1 tablet (10 mg total) by mouth daily.    . methylcellulose (ARTIFICIAL TEARS) 1 % ophthalmic solution Place 1 drop into both eyes daily as needed (dry eyes).     . polyethylene glycol (MIRALAX / GLYCOLAX) packet Take 17 g by mouth daily as needed (constipation).    . Probiotic  Product (PROBIOTIC DAILY PO) Take 1 capsule by mouth daily as needed (Digestion).      No current facility-administered medications for this visit.      Past Medical History:  Diagnosis Date  . Anemia 05/22/2012  . Benign essential HTN   . Broken wrist 10-22-11   left  . Calcification of cartilage    ear  . Cardiovascular system disease 07/02/2011  . Chicken pox as a child  . Closed intertrochanteric fracture of left hip ([place])   . Constipation   . Depression 05/22/2012  . Dermatitis 05/22/2012   Right neck  . Diabetes mellitus 47   type 2  . DM type 2 with diabetic peripheral neuropathy ([place]) 05/18/2015  . Dysphagia   . Enterococcus UTI   . Generalized weakness   . Hemorrhoid 07/02/2011  . HLD (hyperlipidemia)   . Hyperlipidemia 72  . Hypertension 72  . IBS (irritable bowel syndrome)   . Malnutrition of moderate degree ([place])   . Measles as a child  . Osteoporosis 07/02/2011  . Postoperative anemia due to acute blood loss   . Unsteady gait   . Vertigo 2010   benign  . Vision loss of right eye   . Weight loss 07/31/2011    ROS:   All systems reviewed and negative except as noted in the HPI.   Past Surgical History:  Procedure Laterality Date  . ABDOMINAL HYSTERECTOMY  2008  partial still has ovaries  . HEMORROIDECTOMY  1979  . INTRAMEDULLARY (IM) NAIL INTERTROCHANTERIC Right 07/24/2013   Procedure: INTRAMEDULLARY (IM) NAIL INTERTROCHANTRIC;  Surgeon: Gearlean Alf, MD;  Location: [facility];  Service: Orthopedics;  Laterality: Right;  . INTRAMEDULLARY (IM) NAIL INTERTROCHANTERIC Left 05/31/2015   Procedure: INTRAMEDULLARY (IM) NAIL INTERTROCHANTRIC;  Surgeon: Rod Can, MD;  Location: [facility];  Service: Orthopedics;  Laterality: Left;  . knee scoped  2002   right  . lens implant left  2006  . PACEMAKER INSERTION  2010  . ROTATOR CUFF REPAIR  2004   right  . WRIST SURGERY  11-05-11   left wrist     Family History  Problem Relation Age of Onset  . Diabetes  Mother     type 2  . Hypertension Mother   . Cancer Brother     lung-smoker  . Diabetes Daughter     pre diabetic  . Diabetes Son     type 2  . Cancer Maternal Grandfather     prostate     Social History   Social History  . Marital status: Widowed    Spouse name: N/A  . Number of children: N/A  . Years of education: N/A   Occupational History  . Not on file.   Social History Main Topics  . Smoking status: Never Smoker  . Smokeless tobacco: Never Used  . Alcohol use No  . Drug use: No  . Sexual activity: No   Other Topics Concern  . Not on file   Social History Narrative  . No narrative on file     BP (!) 118/50   Pulse 80   Ht 5\' 2"  (1.575 m)   Wt 102 lb 12.8 oz (46.6 kg)   BMI 18.80 kg/m   Physical Exam:  Chronically ill appearing elderly woman,NAD HEENT: Unremarkable Neck:  6 cm JVD, no thyromegally Back:  No CVA tenderness Lungs:  Clear with no wheezes, rales, or rhonchi. HEART:  Regular rate rhythm, no murmurs, no rubs, no clicks Abd:  soft, positive bowel sounds, no organomegally, no rebound, no guarding Ext:  2 plus pulses, no edema, no cyanosis, no clubbing Skin:  No rashes no nodules Neuro:  CN II through XII intact, motor grossly intact   DEVICE  Normal device function.  See PaceArt for details.   Assess/Plan: 1. Complete heart block - she is s/p PPM and is asymptomatic. 2. PPM - her St. Jude DDD PM is working normally. Will recheck in several months. 3. Stroke - etiology is unclear. Review of her PPM demonstrates no evidence of atrial fib.  Mikle Bosworth.D.

## 2016-04-17 ENCOUNTER — Encounter: Payer: Self-pay | Admitting: Family

## 2016-04-17 ENCOUNTER — Telehealth: Payer: Self-pay | Admitting: Family Medicine

## 2016-04-17 ENCOUNTER — Telehealth: Payer: Medicare Other | Admitting: Family

## 2016-04-17 DIAGNOSIS — E860 Dehydration: Secondary | ICD-10-CM

## 2016-04-17 MED ORDER — CEFDINIR 300 MG PO CAPS: 300.0000 mg | ORAL_CAPSULE | Freq: Two times a day (BID) | ORAL | 0 refills | Status: DC

## 2016-04-17 NOTE — Telephone Encounter (Signed)
Sent in antibiotic and informed the daughter of instructions.

## 2016-04-17 NOTE — Telephone Encounter (Signed)
Caller name: Butler Denmark: Relation to pt: daughter  Call back number:[phone]  Pharmacy: [id number] - [place], [place] - [address] [phone] (Phone) [phone] (Fax)     Reason for call:  Daughter requesting Rx for UTI, daughter declined appointment due to patient not having transportation, patient experiencing decrease urine output, foul order, requesting Rx, please advise

## 2016-04-17 NOTE — Telephone Encounter (Signed)
Am willing to treat once but if she does not get better she needs to get looked at somehwhere. Cefdinir 300 mg caps, 1 cap po bid x 5 days and probiotics.

## 2016-04-17 NOTE — Progress Notes (Signed)
Based on what you shared with me it looks like you have a serious condition that should be evaluated in a face to face office visit.  If you are having a true medical emergency please call 911.  If you need an urgent face to face visit, [facility] has four urgent care centers for your convenience.  If you need care fast and have a high deductible or no insurance consider:   [email or web address]  [phone]  [address], [address] [place], [place] [address] 8 am to 8 pm Monday-Friday 10 am to 4 pm Saturday-Sunday   The following sites will take your  insurance:    . [facility]  [phone] Get Driving Directions Find a Provider at this Location  [address] [place], [place] [address] . 10 am to 8 pm Monday-Friday . 12 pm to 8 pm Saturday-Sunday   . [facility]  [phone] Get Driving Directions Find a Provider at this Location  [address], [address] [place], [place] [address] . 8 am to 8 pm Monday-Friday . 9 am to 6 pm Saturday . 11 am to 6 pm Sunday   .  [facility] [place]  [phone] Get Driving Directions  [address] [place], [place] [address] . 8 am to 8 pm Monday-Friday . 8 am to 4 pm Saturday-Sunday   . [facility] (walk-ins welcome, or call for a scheduled time)  [phone]  Get Driving Directions Find a Provider at this Location  [address] [place], [place] [address] . 8 am to 8:30 pm Monday-Thursday . 8 am to 6 pm Friday . 8 am to 4 pm Saturday-Sunday   Your e-visit answers were reviewed by a board certified advanced clinical practitioner to complete your personal care plan.  Thank you for using e-Visits.  

## 2016-05-01 ENCOUNTER — Other Ambulatory Visit: Payer: Self-pay | Admitting: Family Medicine

## 2016-05-03 ENCOUNTER — Telehealth: Payer: Self-pay | Admitting: Family Medicine

## 2016-05-03 NOTE — Telephone Encounter (Signed)
Patient scheduled medicare wellness appointment with Hoyle Sauer for 05/31/16 at 10:30am

## 2016-05-15 ENCOUNTER — Encounter: Payer: Medicare Other | Admitting: *Deleted

## 2016-05-15 ENCOUNTER — Telehealth: Payer: Self-pay | Admitting: Cardiology

## 2016-05-15 NOTE — Telephone Encounter (Signed)
Confirmed remote transmission w/ pt daughter. She informed me that pt will be moving soon the [place]. Instructed pt daughter that I would order a cell adapter and that way she will have it before her move. Pt daughter verbalized understanding.

## 2016-05-18 ENCOUNTER — Encounter: Payer: Self-pay | Admitting: Cardiology

## 2016-05-31 ENCOUNTER — Ambulatory Visit: Payer: Medicare Other | Admitting: *Deleted

## 2016-06-25 ENCOUNTER — Observation Stay (HOSPITAL_COMMUNITY)
Admission: EM | Admit: 2016-06-25 | Discharge: 2016-06-27 | Disposition: A | Discharge status: Skilled Nursing Facility | Payer: Medicare Other | Attending: Internal Medicine | Admitting: Internal Medicine

## 2016-06-25 ENCOUNTER — Encounter (HOSPITAL_COMMUNITY): Payer: Self-pay | Admitting: Emergency Medicine

## 2016-06-25 ENCOUNTER — Emergency Department (HOSPITAL_COMMUNITY): Payer: Medicare Other

## 2016-06-25 DIAGNOSIS — Z66 Do not resuscitate: Secondary | ICD-10-CM | POA: Insufficient documentation

## 2016-06-25 DIAGNOSIS — R627 Adult failure to thrive: Secondary | ICD-10-CM | POA: Diagnosis not present

## 2016-06-25 DIAGNOSIS — I10 Essential (primary) hypertension: Secondary | ICD-10-CM | POA: Diagnosis present

## 2016-06-25 DIAGNOSIS — R531 Weakness: Secondary | ICD-10-CM | POA: Diagnosis not present

## 2016-06-25 DIAGNOSIS — G309 Alzheimer's disease, unspecified: Secondary | ICD-10-CM | POA: Diagnosis not present

## 2016-06-25 DIAGNOSIS — Z8673 Personal history of transient ischemic attack (TIA), and cerebral infarction without residual deficits: Secondary | ICD-10-CM | POA: Diagnosis not present

## 2016-06-25 DIAGNOSIS — F0390 Unspecified dementia without behavioral disturbance: Secondary | ICD-10-CM | POA: Diagnosis not present

## 2016-06-25 DIAGNOSIS — G9340 Encephalopathy, unspecified: Secondary | ICD-10-CM | POA: Insufficient documentation

## 2016-06-25 DIAGNOSIS — Z794 Long term (current) use of insulin: Secondary | ICD-10-CM | POA: Diagnosis not present

## 2016-06-25 DIAGNOSIS — K219 Gastro-esophageal reflux disease without esophagitis: Secondary | ICD-10-CM | POA: Diagnosis not present

## 2016-06-25 DIAGNOSIS — E43 Unspecified severe protein-calorie malnutrition: Secondary | ICD-10-CM | POA: Diagnosis not present

## 2016-06-25 DIAGNOSIS — R69 Illness, unspecified: Secondary | ICD-10-CM

## 2016-06-25 DIAGNOSIS — R54 Age-related physical debility: Secondary | ICD-10-CM | POA: Insufficient documentation

## 2016-06-25 DIAGNOSIS — Z950 Presence of cardiac pacemaker: Secondary | ICD-10-CM | POA: Insufficient documentation

## 2016-06-25 DIAGNOSIS — E1121 Type 2 diabetes mellitus with diabetic nephropathy: Secondary | ICD-10-CM | POA: Diagnosis not present

## 2016-06-25 DIAGNOSIS — E860 Dehydration: Secondary | ICD-10-CM | POA: Diagnosis present

## 2016-06-25 DIAGNOSIS — J069 Acute upper respiratory infection, unspecified: Secondary | ICD-10-CM | POA: Diagnosis not present

## 2016-06-25 DIAGNOSIS — Z79899 Other long term (current) drug therapy: Secondary | ICD-10-CM | POA: Insufficient documentation

## 2016-06-25 DIAGNOSIS — E785 Hyperlipidemia, unspecified: Secondary | ICD-10-CM | POA: Diagnosis present

## 2016-06-25 DIAGNOSIS — J111 Influenza due to unidentified influenza virus with other respiratory manifestations: Secondary | ICD-10-CM | POA: Diagnosis present

## 2016-06-25 DIAGNOSIS — L8990 Pressure ulcer of unspecified site, unspecified stage: Secondary | ICD-10-CM | POA: Insufficient documentation

## 2016-06-25 LAB — CBC
HCT: 40.4 % (ref 36.0–46.0)
Hemoglobin: 12.7 g/dL (ref 12.0–15.0)
MCH: 28.5 pg (ref 26.0–34.0)
MCHC: 31.4 g/dL (ref 30.0–36.0)
MCV: 90.6 fL (ref 78.0–100.0)
PLATELETS: 219 10*3/uL (ref 150–400)
RBC: 4.46 MIL/uL (ref 3.87–5.11)
RDW: 14.2 % (ref 11.5–15.5)
WBC: 9 10*3/uL (ref 4.0–10.5)

## 2016-06-25 LAB — BASIC METABOLIC PANEL
Anion gap: 8 (ref 5–15)
BUN: 26 mg/dL — ABNORMAL HIGH (ref 6–20)
CALCIUM: 9.5 mg/dL (ref 8.9–10.3)
CO2: 29 mmol/L (ref 22–32)
CREATININE: 0.92 mg/dL (ref 0.44–1.00)
Chloride: 106 mmol/L (ref 101–111)
GFR, EST NON AFRICAN AMERICAN: 54 mL/min — AB (ref >60)
Glucose, Bld: 123 mg/dL — ABNORMAL HIGH (ref 65–99)
Potassium: 3.8 mmol/L (ref 3.5–5.1)
SODIUM: 143 mmol/L (ref 135–145)

## 2016-06-25 LAB — URINALYSIS, ROUTINE W REFLEX MICROSCOPIC
BILIRUBIN URINE: NEGATIVE
Bacteria, UA: NONE SEEN
GLUCOSE, UA: NEGATIVE mg/dL
KETONES UR: 5 mg/dL — AB
LEUKOCYTES UA: NEGATIVE
Nitrite: NEGATIVE
PH: 5 (ref 5.0–8.0)
Protein, ur: 30 mg/dL — AB
Specific Gravity, Urine: 1.023 (ref 1.005–1.030)

## 2016-06-25 LAB — CBG MONITORING, ED: Glucose-Capillary: 117 mg/dL — ABNORMAL HIGH (ref 65–99)

## 2016-06-25 MED ORDER — ALUM & MAG HYDROXIDE-SIMETH 200-200-20 MG/5ML PO SUSP: 30.0000 mL | Freq: Four times a day (QID) | ORAL | Status: DC | PRN

## 2016-06-25 MED ORDER — AMLODIPINE BESYLATE 5 MG PO TABS
5.0000 mg | ORAL_TABLET | Freq: Every day | ORAL | Status: DC
  Administered 2016-06-25 – 2016-06-27 (×3): 5 mg via ORAL
  Filled 2016-06-25 (×3): qty 1

## 2016-06-25 MED ORDER — IPRATROPIUM-ALBUTEROL 0.5-2.5 (3) MG/3ML IN SOLN: 3.0000 mL | Freq: Four times a day (QID) | RESPIRATORY_TRACT | Status: DC | PRN

## 2016-06-25 MED ORDER — SODIUM CHLORIDE 0.45 % IV SOLN
INTRAVENOUS | Status: DC
  Administered 2016-06-25 – 2016-06-26 (×2): 75 mL/h via INTRAVENOUS

## 2016-06-25 MED ORDER — FLUTICASONE PROPIONATE 50 MCG/ACT NA SUSP
2.0000 | Freq: Every day | NASAL | Status: DC
  Administered 2016-06-27: 09:00:00 2 via NASAL
  Filled 2016-06-25: qty 16

## 2016-06-25 MED ORDER — SALINE SPRAY 0.65 % NA SOLN
1.0000 | Freq: Four times a day (QID) | NASAL | Status: DC | PRN
  Filled 2016-06-25: qty 44

## 2016-06-25 MED ORDER — ATORVASTATIN CALCIUM 10 MG PO TABS
10.0000 mg | ORAL_TABLET | Freq: Every day | ORAL | Status: DC
  Administered 2016-06-26 – 2016-06-27 (×2): 10 mg via ORAL
  Filled 2016-06-25 (×2): qty 1

## 2016-06-25 MED ORDER — POLYETHYLENE GLYCOL 3350 17 G PO PACK: 17.0000 g | PACK | Freq: Every day | ORAL | Status: DC | PRN

## 2016-06-25 MED ORDER — HYDRALAZINE HCL 20 MG/ML IJ SOLN: 5.0000 mg | INTRAMUSCULAR | Status: DC | PRN

## 2016-06-25 MED ORDER — POLYVINYL ALCOHOL 1.4 % OP SOLN: 1.0000 [drp] | Freq: Every day | OPHTHALMIC | Status: DC | PRN

## 2016-06-25 MED ORDER — SODIUM CHLORIDE 0.9 % IV BOLUS (SEPSIS)
500.0000 mL | Freq: Once | INTRAVENOUS | Status: AC
  Administered 2016-06-25: 500 mL via INTRAVENOUS

## 2016-06-25 MED ORDER — BISACODYL 10 MG RE SUPP: 10.0000 mg | Freq: Every day | RECTAL | Status: DC | PRN

## 2016-06-25 MED ORDER — DONEPEZIL HCL 10 MG PO TABS
10.0000 mg | ORAL_TABLET | Freq: Every day | ORAL | Status: DC
  Administered 2016-06-26: 10 mg via ORAL
  Filled 2016-06-25: qty 1

## 2016-06-25 MED ORDER — SODIUM CHLORIDE 0.9 % IV BOLUS (SEPSIS)
250.0000 mL | Freq: Once | INTRAVENOUS | Status: AC
  Administered 2016-06-25: 250 mL via INTRAVENOUS

## 2016-06-25 MED ORDER — IPRATROPIUM-ALBUTEROL 20-100 MCG/ACT IN AERS: 2.0000 | INHALATION_SPRAY | Freq: Four times a day (QID) | RESPIRATORY_TRACT | Status: DC | PRN

## 2016-06-25 MED ORDER — BOOST PO LIQD
237.0000 mL | Freq: Every day | ORAL | Status: DC | PRN
  Filled 2016-06-25: qty 237

## 2016-06-25 MED ORDER — DEXTROSE-NACL 5-0.45 % IV SOLN: INTRAVENOUS | Status: DC

## 2016-06-25 MED ORDER — LORATADINE 10 MG PO TABS
10.0000 mg | ORAL_TABLET | Freq: Every day | ORAL | Status: DC | PRN
Start: 2016-06-25 — End: 2016-06-27

## 2016-06-25 NOTE — ED Provider Notes (Signed)
[facility] DEPT Provider Note   CSN: [id number] Arrival date & time: 06/25/16  [id number]     History   Chief Complaint Chief Complaint  Patient presents with  . Cough  . Weakness    HPI Briana Thornton is a 83 y.o. female.  HPI Pt presents with cough and increasing weakness.  She lives at home and requires assistance with walking, bathing and will feed herself some but will need encouragement.  The last few days she has been having increasing cough and congestion.  She is not on oxygen at home.  Pt's daughter is a Marine scientist and has bene trying OTC meds.  Her symptoms are progressing.  She is not eating or drinking as much.  She continues to get more weak.  She is also getting a little more confused. No fevers at home. Past Medical History:  Diagnosis Date  . Anemia 05/22/2012  . Benign essential HTN   . Broken wrist 10-22-11   left  . Calcification of cartilage    ear  . Cardiovascular system disease 07/02/2011  . Chicken pox as a child  . Closed intertrochanteric fracture of left hip ([place])   . Constipation   . Depression 05/22/2012  . Dermatitis 05/22/2012   Right neck  . Diabetes mellitus 47   type 2  . DM type 2 with diabetic peripheral neuropathy ([place]) 05/18/2015  . Dysphagia   . Enterococcus UTI   . Generalized weakness   . Hemorrhoid 07/02/2011  . HLD (hyperlipidemia)   . Hyperlipidemia 72  . Hypertension 72  . IBS (irritable bowel syndrome)   . Malnutrition of moderate degree ([place])   . Measles as a child  . Osteoporosis 07/02/2011  . Postoperative anemia due to acute blood loss   . Unsteady gait   . Vertigo 2010   benign  . Vision loss of right eye   . Weight loss 07/31/2011    Patient Active Problem List   Diagnosis Date Noted  . Influenza-like illness 06/25/2016  . Acute bronchitis due to human metapneumovirus 10/02/2015  . Acute respiratory failure with hypoxia ([place]) 09/30/2015  . Dementia 09/30/2015  . H/O: CVA (cerebrovascular accident) 09/30/2015  . UTI  (lower urinary tract infection) 09/30/2015  . Enterococcus UTI 06/03/2015  . Femur fracture, left ([place]) 05/31/2015  . Intertrochanteric fracture of left hip ([place]) 05/31/2015  . Fracture, intertrochanteric, left femur ([place]) 05/31/2015  . Absolute anemia   . Labile blood pressure   . Hypoalbuminemia due to protein-calorie malnutrition ([place])   . Malnutrition of moderate degree 05/18/2015  . TIA (transient ischemic attack) 05/18/2015  . Left pontine cerebrovascular accident ([place]) 05/18/2015  . DM type 2 with diabetic peripheral neuropathy ([place]) 05/18/2015  . CAP (community acquired pneumonia) 03/05/2015  . SOB (shortness of breath) 03/05/2015  . Hypoxia 03/05/2015  . Weakness 03/05/2015  . Dehydration, mild 03/05/2015  . Controlled type 2 diabetes mellitus with diabetic nephropathy, without long-term current use of insulin ([place])   . Onychomycosis 10/31/2014  . UTI symptoms 01/25/2014  . Dysphagia 01/25/2014  . Abnormal finding on urinalysis 01/25/2014  . Dementia without behavioral disturbance 08/06/2013  . Postoperative anemia due to acute blood loss 07/26/2013  . Hip fracture ([place]) 07/24/2013  . Thyroid mass of unclear etiology 07/24/2013  . Fall at home 07/24/2013  . Cerumen impaction 04/21/2013  . Cognitive decline 02/15/2013  . Anemia 05/22/2012  . Depression 05/22/2012  . Dermatitis 05/22/2012  . Broken wrist   . Pacemaker-St.Jude 09/18/2011  . URI (upper  respiratory infection) 08/31/2011  . Lymph node enlargement 08/31/2011  . Weight loss 07/31/2011  . Cardiovascular system disease 07/02/2011  . Hemorrhoid 07/02/2011  . Osteoporosis 07/02/2011  . Essential hypertension   . Hyperlipidemia   . IBS (irritable bowel syndrome)   . DM (diabetes mellitus) ([place])   . Calcification of cartilage   . BPPV (benign paroxysmal positional vertigo)   . Vision loss of right eye     Past Surgical History:  Procedure Laterality Date  . ABDOMINAL HYSTERECTOMY  2008   partial still  has ovaries  . HEMORROIDECTOMY  1979  . INTRAMEDULLARY (IM) NAIL INTERTROCHANTERIC Right 07/24/2013   Procedure: INTRAMEDULLARY (IM) NAIL INTERTROCHANTRIC;  Surgeon: Gearlean Alf, MD;  Location: [facility];  Service: Orthopedics;  Laterality: Right;  . INTRAMEDULLARY (IM) NAIL INTERTROCHANTERIC Left 05/31/2015   Procedure: INTRAMEDULLARY (IM) NAIL INTERTROCHANTRIC;  Surgeon: Rod Can, MD;  Location: [facility];  Service: Orthopedics;  Laterality: Left;  . knee scoped  2002   right  . lens implant left  2006  . PACEMAKER INSERTION  2010  . ROTATOR CUFF REPAIR  2004   right  . WRIST SURGERY  11-05-11   left wrist    OB History    No data available       Home Medications    Prior to Admission medications   Medication Sig Start Date End Date Taking? Authorizing Provider  acetaminophen (TYLENOL) 500 MG tablet Take 1,000 mg by mouth every 8 (eight) hours as needed for mild pain, moderate pain or headache.    Yes Historical Provider, MD  alum & mag hydroxide-simeth (MAALOX ADVANCED) 200-200-20 MG/5ML suspension Take 30 mLs by mouth every 6 (six) hours as needed for indigestion or heartburn.   Yes Historical Provider, MD  atorvastatin (LIPITOR) 10 MG tablet TAKE 1 TABLET BY MOUTH EVERY DAY Patient taking differently: TAKE 10mg  TABLET BY MOUTH daily 05/01/16  Yes Mosie Lukes, MD  bisacodyl (DULCOLAX) 10 MG suppository Place 1 suppository (10 mg total) rectally daily as needed for moderate constipation. 10/03/15  Yes Bonnielee Haff, MD  docusate sodium (COLACE) 100 MG capsule Take 100 mg by mouth daily as needed (constipation).    Yes Historical Provider, MD  donepezil (ARICEPT) 10 MG tablet TAKE 1 TABLET (10 MG TOTAL) BY MOUTH AT BEDTIME. Patient taking differently: TAKE 1 TABLET (10 MG TOTAL) BY MOUTH daily 10/20/15  Yes Mosie Lukes, MD  fluticasone (FLONASE) 50 MCG/ACT nasal spray Place 2 sprays into both nostrils daily as needed for allergies or rhinitis.   Yes Historical Provider, MD    Ipratropium-Albuterol (COMBIVENT RESPIMAT) 20-100 MCG/ACT AERS respimat Inhale 2 puffs into the lungs 4 (four) times daily. Patient taking differently: Inhale 2 puffs into the lungs every 6 (six) hours as needed for wheezing or shortness of breath.  10/03/15  Yes Bonnielee Haff, MD  lactose free nutrition (BOOST) LIQD Take 237 mLs by mouth daily as needed (poor appetite).    Yes Historical Provider, MD  lisinopril (PRINIVIL,ZESTRIL) 10 MG tablet Take 1 tablet (10 mg total) by mouth daily. 07/26/15  Yes Yvonne R Lowne Chase, DO  loratadine (CLARITIN) 10 MG tablet Take 1 tablet (10 mg total) by mouth daily. Patient taking differently: Take 10 mg by mouth daily as needed for allergies.  10/03/15  Yes Bonnielee Haff, MD  methylcellulose (ARTIFICIAL TEARS) 1 % ophthalmic solution Place 1 drop into both eyes daily as needed (dry eyes).    Yes Historical Provider, MD  polyethylene glycol (MIRALAX /  GLYCOLAX) packet Take 17 g by mouth daily as needed (constipation).   Yes Historical Provider, MD  Probiotic Product (PROBIOTIC DAILY PO) Take 1 capsule by mouth daily.    Yes Historical Provider, MD  cefdinir (OMNICEF) 300 MG capsule Take 1 capsule (300 mg total) by mouth 2 (two) times daily. Take for 5 days Patient not taking: Reported on 06/25/2016 04/17/16   Mosie Lukes, MD    Family History Family History  Problem Relation Age of Onset  . Diabetes Mother     type 2  . Hypertension Mother   . Cancer Brother     lung-smoker  . Diabetes Daughter     pre diabetic  . Diabetes Son     type 2  . Cancer Maternal Grandfather     prostate    Social History Social History  Substance Use Topics  . Smoking status: Never Smoker  . Smokeless tobacco: Never Used  . Alcohol use No     Allergies   Patient has no known allergies.   Review of Systems Review of Systems  All other systems reviewed and are negative.    Physical Exam Updated Vital Signs BP (!) 161/59 (BP Location: Right Arm)    Pulse 62   Temp 97.8 F (36.6 C) (Oral)   Resp 16   Ht 5\' 2"  (1.575 m)   Wt 46.3 kg   SpO2 98%   BMI 18.66 kg/m   Physical Exam  Constitutional: No distress.  Elderly frail   HENT:  Head: Normocephalic and atraumatic.  Right Ear: External ear normal.  Left Ear: External ear normal.  Mouth/Throat: No oropharyngeal exudate.  Mm dry   Eyes: Conjunctivae are normal. Right eye exhibits no discharge. Left eye exhibits no discharge. No scleral icterus.  Neck: Neck supple. No tracheal deviation present.  Cardiovascular: Normal rate, regular rhythm and intact distal pulses.   Pulmonary/Chest: Effort normal and breath sounds normal. No stridor. No respiratory distress. She has no wheezes. She has no rales.  Abdominal: Soft. Bowel sounds are normal. She exhibits no distension. There is no tenderness. There is no rebound and no guarding.  Musculoskeletal: She exhibits no edema or tenderness.  Neurological: She is alert. No cranial nerve deficit (no facial droop, extraocular movements intact, ) or sensory deficit. She exhibits normal muscle tone. She displays no seizure activity. Coordination abnormal.  Does not answer questions, looks at me when I speak to her, generalized weakness, difficulty sitting up in bed  Skin: Skin is warm and dry. No rash noted. She is not diaphoretic.  Psychiatric: She has a normal mood and affect.  Nursing note and vitals reviewed.    ED Treatments / Results  Labs (all labs ordered are listed, but only abnormal results are displayed) Labs Reviewed  BASIC METABOLIC PANEL - Abnormal; Notable for the following:       Result Value   Glucose, Bld 123 (*)    BUN 26 (*)    GFR calc non Af Amer 54 (*)    All other components within normal limits  URINALYSIS, ROUTINE W REFLEX MICROSCOPIC - Abnormal; Notable for the following:    Hgb urine dipstick SMALL (*)    Ketones, ur 5 (*)    Protein, ur 30 (*)    Squamous Epithelial / LPF 0-5 (*)    All other components  within normal limits  BASIC METABOLIC PANEL - Abnormal; Notable for the following:    Creatinine, Ser 1.01 (*)    Calcium  8.6 (*)    GFR calc non Af Amer 48 (*)    GFR calc Af Amer 56 (*)    All other components within normal limits  CBG MONITORING, ED - Abnormal; Notable for the following:    Glucose-Capillary 117 (*)    All other components within normal limits  CBC  INFLUENZA PANEL BY PCR (TYPE A & B)    EKG  EKG Interpretation  Date/Time:  Monday June 25 2016 10:18:16 EST Ventricular Rate:  128 PR Interval:    QRS Duration: 120 QT Interval:    QTC Calculation:   R Axis:   49 Text Interpretation:  Failure to sense and/or capture (?magnet) No further rhythm analysis attempted due to paced rhythm Nonspecific intraventricular conduction delay Borderline ST depression, diffuse leads Minimal ST elevation, inferior leads Baseline wander in lead(s) V3 Poor data quality in current ECG precludes serial comparison Confirmed by Jaselle Pryer  MD-J, Teddie Curd [id number]) on 06/25/2016 10:24:54 AM       Radiology Dg Chest 2 View  Result Date: 06/25/2016 CLINICAL DATA:  Cough. EXAM: CHEST  2 VIEW COMPARISON:  10/01/2015 . FINDINGS: Cardiac pacer with lead tip over the right atrium right ventricle. Heart size normal. Persistent changes of left base scarring. Calcified nodular densities are noted on the left consistent granulomas. No pleural effusion or pneumothorax. IMPRESSION: Cardiac pacer in stable position. No acute cardiopulmonary disease . Electronically Signed   By: Marcello Moores  Register   On: 06/25/2016 11:57    Procedures Procedures (including critical care time)  Medications Ordered in ED Medications  atorvastatin (LIPITOR) tablet 10 mg (not administered)  donepezil (ARICEPT) tablet 10 mg (not administered)  bisacodyl (DULCOLAX) suppository 10 mg (not administered)  polyethylene glycol (MIRALAX / GLYCOLAX) packet 17 g (not administered)  fluticasone (FLONASE) 50 MCG/ACT nasal spray 2 spray (not  administered)  lactose free nutrition (Boost) liquid 237 mL (not administered)  loratadine (CLARITIN) tablet 10 mg (not administered)  alum & mag hydroxide-simeth (MAALOX/MYLANTA) 200-200-20 MG/5ML suspension 30 mL (not administered)  polyvinyl alcohol (LIQUIFILM TEARS) 1.4 % ophthalmic solution 1 drop (not administered)  amLODipine (NORVASC) tablet 5 mg (5 mg Oral Given 06/25/16 2020)  hydrALAZINE (APRESOLINE) injection 5-20 mg (not administered)  sodium chloride (OCEAN) 0.65 % nasal spray 1 spray (not administered)  0.45 % sodium chloride infusion (75 mL/hr Intravenous New Bag/Given 06/26/16 0813)  ipratropium-albuterol (DUONEB) 0.5-2.5 (3) MG/3ML nebulizer solution 3 mL (not administered)  sodium chloride 0.9 % bolus 500 mL (0 mLs Intravenous Stopped 06/25/16 1233)  sodium chloride 0.9 % bolus 250 mL (0 mLs Intravenous Stopped 06/25/16 1430)     Initial Impression / Assessment and Plan / ED Course  I have reviewed the triage vital signs and the nursing notes.  Pertinent labs & imaging results that were available during my care of the patient were reviewed by me and considered in my medical decision making (see chart for details).    Pt presented to the ED with Influenza like illness and progressive weakness.  Poor po intake.  Probably component of renal insuffieciency, dehydration.  Pt normally is able to walk with some assistance.  Unable to do that at this point.  Daughter is having difficulty caring for her at home.  Does not feel safe to take her home.  May benefit from admission, iv fluid , social work consult.  Final Clinical Impressions(s) / ED Diagnoses   Final diagnoses:  Influenza-like illness  Dehydration      Dorie Rank, MD 06/26/16 [phone]

## 2016-06-25 NOTE — ED Triage Notes (Signed)
Patient is complaining of productive cough, confusion, and weakness x1 week. At baseline, patient is alert and ambulates with a walker. Over the past week, patient has stopped being able to ambulate due to being weak. Hx of dementia.

## 2016-06-25 NOTE — ED Notes (Signed)
ED Provider at bedside. 

## 2016-06-25 NOTE — H&P (Signed)
History and Physical   Elanie Ferdon [id number] DOB: 03/19/1927 DOA: 06/25/2016  Referring MD/NP/PA: Dr. Tomi Bamberger, [place] PCP: Penni Homans, MD Patient coming from: Home  Chief Complaint: Weakness  HPI: Briana Thornton is a 83 y.o. female with a history of CVA, dementia, and HTN brought by her daughter for evaluation of cough and weakness. 4 days ago she gradually developed mild-moderate intermittent nonproductive cough associated with nasal congestion and rhinorrhea. OTC treatment have not significantly improved symptoms. She has lived in a garage apartment for the past 5 years with her daughter who is a Marine scientist and has been assisting her with ADLs and mobility for several years. She reports significant functional decline with this illness, like others in the past, to the point that the patient is unable to support her own weight for transfers and would nearly fall off the bedside commode due to weakness. Her per oral intake has been minimal despite significant encouragement from her daughter. Mentation has been abnormal for the past 3 days and remains abnormal prompting an ED visit.   In the ED she was afebrile but frail-appearing with unremarkable labs, negative CXR and urinalysis, and paced rhythm on ECG. She was thought to be dehydrated and given gentle IV fluids, 750cc, with general improvement in appearance. She remains unable to support her own weight and her mental status is not at baseline. TRH was called to admit.  Review of Systems: No fevers, dyspnea, chest pain, palpitations, abd pain, N/V/D. Has had decreased urine output, and per HPI. All others reviewed and are negative.   Past Medical History:  Diagnosis Date  . Anemia 05/22/2012  . Benign essential HTN   . Broken wrist 10-22-11   left  . Calcification of cartilage    ear  . Cardiovascular system disease 07/02/2011  . Chicken pox as a child  . Closed intertrochanteric fracture of left hip ([place])   . Constipation   . Depression  05/22/2012  . Dermatitis 05/22/2012   Right neck  . Diabetes mellitus 47   type 2  . DM type 2 with diabetic peripheral neuropathy ([place]) 05/18/2015  . Dysphagia   . Enterococcus UTI   . Generalized weakness   . Hemorrhoid 07/02/2011  . HLD (hyperlipidemia)   . Hyperlipidemia 72  . Hypertension 72  . IBS (irritable bowel syndrome)   . Malnutrition of moderate degree ([place])   . Measles as a child  . Osteoporosis 07/02/2011  . Postoperative anemia due to acute blood loss   . Unsteady gait   . Vertigo 2010   benign  . Vision loss of right eye   . Weight loss 07/31/2011    Past Surgical History:  Procedure Laterality Date  . ABDOMINAL HYSTERECTOMY  2008   partial still has ovaries  . HEMORROIDECTOMY  1979  . INTRAMEDULLARY (IM) NAIL INTERTROCHANTERIC Right 07/24/2013   Procedure: INTRAMEDULLARY (IM) NAIL INTERTROCHANTRIC;  Surgeon: Gearlean Alf, MD;  Location: [facility];  Service: Orthopedics;  Laterality: Right;  . INTRAMEDULLARY (IM) NAIL INTERTROCHANTERIC Left 05/31/2015   Procedure: INTRAMEDULLARY (IM) NAIL INTERTROCHANTRIC;  Surgeon: Rod Can, MD;  Location: [facility];  Service: Orthopedics;  Laterality: Left;  . knee scoped  2002   right  . lens implant left  2006  . PACEMAKER INSERTION  2010  . ROTATOR CUFF REPAIR  2004   right  . WRIST SURGERY  11-05-11   left wrist    - Nonsmoker, no EtOH, no illicit drugs. Lives with daughter as above.   No  Known Allergies  Family History  Problem Relation Age of Onset  . Diabetes Mother     type 2  . Hypertension Mother   . Cancer Brother     lung-smoker  . Diabetes Daughter     pre diabetic  . Diabetes Son     type 2  . Cancer Maternal Grandfather     prostate   - Family history otherwise reviewed and not pertinent.  Prior to Admission medications   Medication Sig Start Date End Date Taking? Authorizing Provider  acetaminophen (TYLENOL) 500 MG tablet Take 1,000 mg by mouth every 8 (eight) hours as needed for mild pain,  moderate pain or headache.    Yes Historical Provider, MD  alum & mag hydroxide-simeth (MAALOX ADVANCED) 200-200-20 MG/5ML suspension Take 30 mLs by mouth every 6 (six) hours as needed for indigestion or heartburn.   Yes Historical Provider, MD  atorvastatin (LIPITOR) 10 MG tablet TAKE 1 TABLET BY MOUTH EVERY DAY Patient taking differently: TAKE 10mg  TABLET BY MOUTH daily 05/01/16  Yes Mosie Lukes, MD  bisacodyl (DULCOLAX) 10 MG suppository Place 1 suppository (10 mg total) rectally daily as needed for moderate constipation. 10/03/15  Yes Bonnielee Haff, MD  docusate sodium (COLACE) 100 MG capsule Take 100 mg by mouth daily as needed (constipation).    Yes Historical Provider, MD  donepezil (ARICEPT) 10 MG tablet TAKE 1 TABLET (10 MG TOTAL) BY MOUTH AT BEDTIME. Patient taking differently: TAKE 1 TABLET (10 MG TOTAL) BY MOUTH daily 10/20/15  Yes Mosie Lukes, MD  fluticasone (FLONASE) 50 MCG/ACT nasal spray Place 2 sprays into both nostrils daily as needed for allergies or rhinitis.   Yes Historical Provider, MD  Ipratropium-Albuterol (COMBIVENT RESPIMAT) 20-100 MCG/ACT AERS respimat Inhale 2 puffs into the lungs 4 (four) times daily. Patient taking differently: Inhale 2 puffs into the lungs every 6 (six) hours as needed for wheezing or shortness of breath.  10/03/15  Yes Bonnielee Haff, MD  lactose free nutrition (BOOST) LIQD Take 237 mLs by mouth daily as needed (poor appetite).    Yes Historical Provider, MD  lisinopril (PRINIVIL,ZESTRIL) 10 MG tablet Take 1 tablet (10 mg total) by mouth daily. 07/26/15  Yes Yvonne R Lowne Chase, DO  loratadine (CLARITIN) 10 MG tablet Take 1 tablet (10 mg total) by mouth daily. Patient taking differently: Take 10 mg by mouth daily as needed for allergies.  10/03/15  Yes Bonnielee Haff, MD  methylcellulose (ARTIFICIAL TEARS) 1 % ophthalmic solution Place 1 drop into both eyes daily as needed (dry eyes).    Yes Historical Provider, MD  polyethylene glycol (MIRALAX /  GLYCOLAX) packet Take 17 g by mouth daily as needed (constipation).   Yes Historical Provider, MD  Probiotic Product (PROBIOTIC DAILY PO) Take 1 capsule by mouth daily.    Yes Historical Provider, MD  cefdinir (OMNICEF) 300 MG capsule Take 1 capsule (300 mg total) by mouth 2 (two) times daily. Take for 5 days Patient not taking: Reported on 06/25/2016 04/17/16   Mosie Lukes, MD    Physical Exam: Vitals:   06/25/16 1129 06/25/16 1150 06/25/16 1200 06/25/16 1231  BP:   176/62 176/62  Pulse: (!) 55 66 (!) 59 60  Resp:    14  Temp:      SpO2: 96% 99% 97% 100%  Weight:      Height:       Constitutional: Thin, frail-appearing elderly female in no distress, calm demeanor Eyes: Lids and conjunctivae normal, PERRL  ENMT: Mucous membranes are tacky. Turbinates are bulky bilaterally wit yellow drainage crusting. No sinus pain on percussion. Posterior pharynx clear of any exudate or lesions on limited exam. Poor dentition.  Neck: normal, supple, no masses, no thyromegaly Respiratory: Non-labored breathing room air without accessory muscle use. Clear breath sounds to auscultation bilaterally Cardiovascular: Regular rate and rhythm, no murmurs, rubs, or gallops. No carotid bruits. No JVD. No LE edema. 2+ pedal pulses. Abdomen: Normoactive bowel sounds. No tenderness, non-distended, and no masses palpated. No hepatosplenomegaly. GU: No indwelling catheter Musculoskeletal: No clubbing / cyanosis. No joint deformity upper and lower extremities. Good ROM, no contractures. Normal muscle tone.  Skin: Warm, dry. No rashes, wounds, no ulcers. No significant lesions noted.  Neurologic: Poorly cooperative with exam due to limited effort. CN II-XII grossly intact. Speech normal, low volume. No focal deficits in motor strength or sensation in all extremities.  Psychiatric: Alert and oriented x3. Normal judgment and insight.   Labs on Admission: I have personally reviewed following labs and imaging  studies  CBC:  Recent Labs Lab 06/25/16 1123  WBC 9.0  HGB 12.7  HCT 40.4  MCV 90.6  PLT [id number]   Basic Metabolic Panel:  Recent Labs Lab 06/25/16 1123  NA 143  K 3.8  CL 106  CO2 29  GLUCOSE 123*  BUN 26*  CREATININE 0.92  CALCIUM 9.5   GFR: Estimated Creatinine Clearance: 30.9 mL/min (by C-G formula based on SCr of 0.92 mg/dL).  Urine analysis:    Component Value Date/Time   COLORURINE YELLOW 06/25/2016 0939   APPEARANCEUR CLEAR 06/25/2016 0939   LABSPEC 1.023 06/25/2016 0939   PHURINE 5.0 06/25/2016 0939   GLUCOSEU NEGATIVE 06/25/2016 0939   GLUCOSEU NEGATIVE 04/26/2015 1155   HGBUR SMALL (A) 06/25/2016 0939   BILIRUBINUR NEGATIVE 06/25/2016 0939   BILIRUBINUR Neg 07/26/2015 1152   KETONESUR 5 (A) 06/25/2016 0939   PROTEINUR 30 (A) 06/25/2016 0939   UROBILINOGEN 2.0 07/26/2015 1152   UROBILINOGEN 1.0 04/26/2015 1155   NITRITE NEGATIVE 06/25/2016 0939   LEUKOCYTESUR NEGATIVE 06/25/2016 0939   Radiological Exams on Admission: Dg Chest 2 View  Result Date: 06/25/2016 CLINICAL DATA:  Cough. EXAM: CHEST  2 VIEW COMPARISON:  10/01/2015 . FINDINGS: Cardiac pacer with lead tip over the right atrium right ventricle. Heart size normal. Persistent changes of left base scarring. Calcified nodular densities are noted on the left consistent granulomas. No pleural effusion or pneumothorax. IMPRESSION: Cardiac pacer in stable position. No acute cardiopulmonary disease . Electronically Signed   By: Marcello Moores  Register   On: 06/25/2016 11:57    EKG: Independently reviewed. Paced rhythm  Assessment/Plan Active Problems:   * No active hospital problems. *   Acute encephalopathy/weakness due to viral URI: Due to level of baseline frailty, she is at very high risk for decompensation. With her given history and lack of fever, highly doubt influenza. Has a history of similar presentations addressed further into disease process which progressed to acute respiratory failure. -  Observe on med-surg - s/p 750cc in ED, will start maintenance IVF's for poor po. Though creatinine is within normal limits, I suspect with her level of sarcopenia that creatine, at 0.92, represents renal impairment. - PT/OT, CSW consulted for possible placement in SNF - [place] nasal spray and flonase - Combivent prn - Monitor pulse oximetry  Dementia: She is not at her baseline mentation per daughter. - Continue aricept - Consult nutrition for possible malnutrition and continue supplements from home. In order to maximize  po intake, will order regular diet - SLP evaluation given daughter's concern for difficulty swallowing  HTN: Holding home lisinopril pending trend in creatinine. - Hypertensive in ED: Will order norvasc 5mg  and hydralazine prn  H/o CVA: No new focal deficits. Not on antiplatelet, formerly on aspirin which has been stopped for unknown reason. - Continue lipitor  DVT prophylaxis: SCDs due to fall risk  Code Status: DNR confirmed at admission  Family Communication: Daughter at bedside Disposition Plan: Uncertain, pending therapy evaluations Consults called: None  Admission status: Observation    Vance Gather, MD [facility] Pager [phone]  If 7PM-7AM, please contact night-coverage [email or web address] Password [email or web address] 06/25/2016, 2:20 PM

## 2016-06-25 NOTE — ED Notes (Signed)
Bed: [id number] Expected date:  Expected time:  Means of arrival:  Comments: tri3

## 2016-06-26 DIAGNOSIS — E860 Dehydration: Secondary | ICD-10-CM

## 2016-06-26 DIAGNOSIS — G309 Alzheimer's disease, unspecified: Secondary | ICD-10-CM | POA: Diagnosis not present

## 2016-06-26 DIAGNOSIS — E785 Hyperlipidemia, unspecified: Secondary | ICD-10-CM

## 2016-06-26 DIAGNOSIS — I10 Essential (primary) hypertension: Secondary | ICD-10-CM | POA: Diagnosis not present

## 2016-06-26 DIAGNOSIS — R531 Weakness: Secondary | ICD-10-CM

## 2016-06-26 DIAGNOSIS — R69 Illness, unspecified: Secondary | ICD-10-CM | POA: Diagnosis not present

## 2016-06-26 DIAGNOSIS — F0280 Dementia in other diseases classified elsewhere without behavioral disturbance: Secondary | ICD-10-CM

## 2016-06-26 DIAGNOSIS — L8990 Pressure ulcer of unspecified site, unspecified stage: Secondary | ICD-10-CM | POA: Insufficient documentation

## 2016-06-26 LAB — BASIC METABOLIC PANEL
Anion gap: 8 (ref 5–15)
BUN: 17 mg/dL (ref 6–20)
CHLORIDE: 107 mmol/L (ref 101–111)
CO2: 26 mmol/L (ref 22–32)
CREATININE: 1.01 mg/dL — AB (ref 0.44–1.00)
Calcium: 8.6 mg/dL — ABNORMAL LOW (ref 8.9–10.3)
GFR calc Af Amer: 56 mL/min — ABNORMAL LOW (ref >60)
GFR calc non Af Amer: 48 mL/min — ABNORMAL LOW (ref >60)
Glucose, Bld: 86 mg/dL (ref 65–99)
Potassium: 3.7 mmol/L (ref 3.5–5.1)
Sodium: 141 mmol/L (ref 135–145)

## 2016-06-26 LAB — INFLUENZA PANEL BY PCR (TYPE A & B)
Influenza A By PCR: NEGATIVE
Influenza B By PCR: NEGATIVE

## 2016-06-26 MED ORDER — SODIUM CHLORIDE 0.9 % IV SOLN
INTRAVENOUS | Status: DC
  Administered 2016-06-26: 21:00:00 via INTRAVENOUS

## 2016-06-26 MED ORDER — ADULT MULTIVITAMIN W/MINERALS CH
1.0000 | ORAL_TABLET | Freq: Every day | ORAL | Status: DC
  Administered 2016-06-26 – 2016-06-27 (×2): 1 via ORAL
  Filled 2016-06-26 (×2): qty 1

## 2016-06-26 MED ORDER — AMLODIPINE BESYLATE 5 MG PO TABS: 5.0000 mg | ORAL_TABLET | Freq: Every day | ORAL | Status: AC

## 2016-06-26 MED ORDER — HYDRALAZINE HCL 20 MG/ML IJ SOLN: 10.0000 mg | Freq: Three times a day (TID) | INTRAMUSCULAR | Status: DC | PRN

## 2016-06-26 MED ORDER — ENSURE ENLIVE PO LIQD
237.0000 mL | Freq: Two times a day (BID) | ORAL | Status: DC
  Administered 2016-06-26 – 2016-06-27 (×2): 237 mL via ORAL

## 2016-06-26 NOTE — NC FL2 (Signed)
[place] MEDICAID FL2 LEVEL OF CARE SCREENING TOOL     IDENTIFICATION  Patient Name: Briana Thornton Birthdate: 05-29-1926 Sex: female Admission Date (Current Location): 06/25/2016  [facility] and [place] Number:  Herbalist and Address:  [facility],  [place] [address] [place]      Provider Number: [id number]  Attending Physician Name and Address:  Barton Dubois, MD  Relative Name and Phone Number:       Current Level of Care: Hospital Recommended Level of Care: [place] Prior Approval Number:    Date Approved/Denied:   PASRR Number: [id number]  Discharge Plan: SNF    Current Diagnoses: Patient Active Problem List   Diagnosis Date Noted  . Influenza-like illness 06/25/2016  . Acute bronchitis due to human metapneumovirus 10/02/2015  . Acute respiratory failure with hypoxia ([place]) 09/30/2015  . Dementia 09/30/2015  . H/O: CVA (cerebrovascular accident) 09/30/2015  . UTI (lower urinary tract infection) 09/30/2015  . Enterococcus UTI 06/03/2015  . Femur fracture, left ([place]) 05/31/2015  . Intertrochanteric fracture of left hip ([place]) 05/31/2015  . Fracture, intertrochanteric, left femur ([place]) 05/31/2015  . Absolute anemia   . Labile blood pressure   . Hypoalbuminemia due to protein-calorie malnutrition ([place])   . Malnutrition of moderate degree 05/18/2015  . TIA (transient ischemic attack) 05/18/2015  . Left pontine cerebrovascular accident ([place]) 05/18/2015  . DM type 2 with diabetic peripheral neuropathy ([place]) 05/18/2015  . CAP (community acquired pneumonia) 03/05/2015  . SOB (shortness of breath) 03/05/2015  . Hypoxia 03/05/2015  . Weakness 03/05/2015  . Dehydration, mild 03/05/2015  . Controlled type 2 diabetes mellitus with diabetic nephropathy, without long-term current use of insulin ([place])   . Onychomycosis 10/31/2014  . UTI symptoms 01/25/2014  . Dysphagia 01/25/2014  . Abnormal finding on urinalysis 01/25/2014   . Dementia without behavioral disturbance 08/06/2013  . Postoperative anemia due to acute blood loss 07/26/2013  . Hip fracture ([place]) 07/24/2013  . Thyroid mass of unclear etiology 07/24/2013  . Fall at home 07/24/2013  . Cerumen impaction 04/21/2013  . Cognitive decline 02/15/2013  . Anemia 05/22/2012  . Depression 05/22/2012  . Dermatitis 05/22/2012  . Broken wrist   . Pacemaker-St.Jude 09/18/2011  . URI (upper respiratory infection) 08/31/2011  . Lymph node enlargement 08/31/2011  . Weight loss 07/31/2011  . Cardiovascular system disease 07/02/2011  . Hemorrhoid 07/02/2011  . Osteoporosis 07/02/2011  . Essential hypertension   . Hyperlipidemia   . IBS (irritable bowel syndrome)   . DM (diabetes mellitus) ([place])   . Calcification of cartilage   . BPPV (benign paroxysmal positional vertigo)   . Vision loss of right eye     Orientation RESPIRATION BLADDER Height & Weight     Self, Place  Normal Incontinent Weight: 102 lb (46.3 kg) Height:  5\' 2"  (157.5 cm)  BEHAVIORAL SYMPTOMS/MOOD NEUROLOGICAL BOWEL NUTRITION STATUS  Other (Comment) (no behaviors)   Continent Diet  AMBULATORY STATUS COMMUNICATION OF NEEDS Skin   Extensive Assist Verbally Other (Comment) (stage 1 on buttocks)                       Personal Care Assistance Level of Assistance  Bathing, Feeding, Dressing Bathing Assistance: Maximum assistance Feeding assistance: Independent Dressing Assistance: Maximum assistance     Functional Limitations Info  Sight, Hearing, Speech Sight Info: Impaired Hearing Info: Adequate Speech Info: Adequate    SPECIAL CARE FACTORS FREQUENCY  PT (By licensed PT), OT (By licensed  OT)     PT Frequency: 5x wk OT Frequency: 5x wk            Contractures Contractures Info: Not present    Additional Factors Info  Code Status Code Status Info: DNR             Current Medications (06/26/2016):  This is the current hospital active medication list Current  Facility-Administered Medications  Medication Dose Route Frequency Provider Last Rate Last Dose  . 0.45 % sodium chloride infusion   Intravenous Continuous Patrecia Pour, MD 75 mL/hr at 06/26/16 0813 75 mL/hr at 06/26/16 0813  . alum & mag hydroxide-simeth (MAALOX/MYLANTA) 200-200-20 MG/5ML suspension 30 mL  30 mL Oral Q6H PRN Patrecia Pour, MD      . amLODipine (NORVASC) tablet 5 mg  5 mg Oral Daily Patrecia Pour, MD   5 mg at 06/26/16 0931  . atorvastatin (LIPITOR) tablet 10 mg  10 mg Oral Daily Patrecia Pour, MD   10 mg at 06/26/16 0933  . bisacodyl (DULCOLAX) suppository 10 mg  10 mg Rectal Daily PRN Patrecia Pour, MD      . donepezil (ARICEPT) tablet 10 mg  10 mg Oral QHS Patrecia Pour, MD      . feeding supplement (ENSURE ENLIVE) (ENSURE ENLIVE) liquid 237 mL  237 mL Oral BID BM Barton Dubois, MD      . fluticasone (FLONASE) 50 MCG/ACT nasal spray 2 spray  2 spray Each Nare Daily Patrecia Pour, MD      . hydrALAZINE (APRESOLINE) injection 5-20 mg  5-20 mg Intravenous Q2H PRN Patrecia Pour, MD      . ipratropium-albuterol (DUONEB) 0.5-2.5 (3) MG/3ML nebulizer solution 3 mL  3 mL Nebulization Q6H PRN Patrecia Pour, MD      . loratadine (CLARITIN) tablet 10 mg  10 mg Oral Daily PRN Patrecia Pour, MD      . multivitamin with minerals tablet 1 tablet  1 tablet Oral Daily Barton Dubois, MD   1 tablet at 06/26/16 1414  . polyethylene glycol (MIRALAX / GLYCOLAX) packet 17 g  17 g Oral Daily PRN Patrecia Pour, MD      . polyvinyl alcohol (LIQUIFILM TEARS) 1.4 % ophthalmic solution 1 drop  1 drop Both Eyes Daily PRN Patrecia Pour, MD      . sodium chloride (OCEAN) 0.65 % nasal spray 1 spray  1 spray Each Nare QID PRN Patrecia Pour, MD         Discharge Medications: Please see discharge summary for a list of discharge medications.  Relevant Imaging Results:  Relevant Lab Results:   Additional Information SS # [id number]   Bradely Rudin, Randall An, LCSW

## 2016-06-26 NOTE — Evaluation (Signed)
Physical Therapy Evaluation Patient Details Name: Briana Thornton MRN: [id number] DOB: February 10, 1927 Today's Date: 06/26/2016   History of Present Illness  Briana Thornton is a 83 y.o. female with a history of CVA, dementia, and HTN brought by her daughter 06/25/16 for evaluation of cough and weakness. negative for flu.  Clinical Impression  The patient is very pleasant. Requires max to total assistance for mobility. Daughter not present for Dc palnning at this time. Pt admitted with above diagnosis. Pt currently with functional limitations due to the deficits listed below (see PT Problem List).  Pt will benefit from skilled PT to increase their independence and safety with mobility to allow discharge to the venue listed below.      Follow Up Recommendations SNF;Home health PT (depends on family desire.)    Equipment Recommendations  None recommended by PT    Recommendations for Other Services       Precautions / Restrictions Precautions Precautions: Fall Precaution Comments: incontinence Restrictions Weight Bearing Restrictions: No      Mobility  Bed Mobility Overal bed mobility: Needs Assistance Bed Mobility: Supine to Sit;Sit to Supine     Supine to sit: Max assist;HOB elevated Sit to supine: Max assist;+2 for physical assistance;+2 for safety/equipment   General bed mobility comments: assist  with legs and trunk. to sit and return to supine  Sat at bed edge wit min assist.  Transfers Overall transfer level: Needs assistance Equipment used: 2 person hand held assist Transfers: Sit to/from [employer] Sit to Stand: Total assist;+2 physical assistance;+2 safety/equipment   Squat pivot transfers: Total assist;+2 physical assistance;+2 safety/equipment     General transfer comment: assist to pivot to BAC, decreased standing on the legs, tends to grasp bed and rail.  similar assistance to return to bed with 2 assist.  Ambulation/Gait                 Stairs            Wheelchair Mobility    Modified Rankin (Stroke Patients Only)       Balance Overall balance assessment: History of Falls;Needs assistance Sitting-balance support: Bilateral upper extremity supported;Feet supported Sitting balance-[name]: Poor Sitting balance - Comments: listing Postural control: Posterior lean                                   Pertinent Vitals/Pain Pain Assessment: Faces Faces Pain Scale: Hurts little more Pain Location: back Pain Descriptors / Indicators: Aching Pain Intervention(s): Limited activity within patient's tolerance;Monitored during session;Repositioned    Home Living Family/patient expects to be discharged to:: Unsure Living Arrangements: Children               Additional Comments: lives with her dtr who is Therapist, sports Leisure centre manager) at [employer]    Prior Function Level of Independence: Needs assistance   Gait / Transfers Assistance Needed: recently requiring incr assist for transfers at home per Briana Thornton     Comments: uses depends     Hand Dominance   Dominant Hand: Left    Extremity/Trunk Assessment   Upper Extremity Assessment Upper Extremity Assessment: Overall WFL for tasks assessed    Lower Extremity Assessment Lower Extremity Assessment: RLE deficits/detail;LLE deficits/detail RLE Deficits / Details: decreased dorsiflexion with weightbearing for transfers, LLE Deficits / Details: similar as right    Cervical / Trunk Assessment Cervical / Trunk Assessment: Kyphotic  Communication   Communication: No difficulties  Cognition Arousal/Alertness:  Awake/alert Behavior During Therapy: WFL for tasks assessed/performed Overall Cognitive Status: History of cognitive impairments - at baseline                 General Comments: followed one step commands with multimodal cues most of the time.    General Comments      Exercises     Assessment/Plan    PT Assessment Patient needs continued  PT services  PT Problem List Decreased strength;Decreased activity tolerance;Decreased balance;Decreased mobility;Decreased knowledge of precautions;Decreased safety awareness;Decreased knowledge of use of DME          PT Treatment Interventions Functional mobility training;Therapeutic activities;Patient/family education    PT Goals (Current goals can be found in the Care Plan section)  Acute Rehab PT Goals Patient Stated Goal: agreed to get up PT Goal Formulation: Patient unable to participate in goal setting Time For Goal Achievement: 07/03/16 Potential to Achieve Goals: Good    Frequency Min 3X/week   Barriers to discharge        Co-evaluation PT/OT/SLP Co-Evaluation/Treatment: Yes Reason for Co-Treatment: For patient/therapist safety PT goals addressed during session: Mobility/safety with mobility OT goals addressed during session: ADL's and self-care       End of Session   Activity Tolerance: Patient tolerated treatment well Patient left: in bed;with bed alarm set;with call bell/phone within reach Nurse Communication: Mobility status    Functional Assessment Tool Used: clinical judgement Functional Limitation: Mobility: Walking and moving around Mobility: Walking and Moving Around Current Status [id number]): At least 60 percent but less than 80 percent impaired, limited or restricted Mobility: Walking and Moving Around Goal Status [phone]): At least 20 percent but less than 40 percent impaired, limited or restricted    Time: 1145-1206 PT Time Calculation (min) (ACUTE ONLY): 21 min   Charges:   PT Evaluation $PT Eval Low Complexity: 1 Procedure     PT G Codes:   PT G-Codes **NOT FOR INPATIENT CLASS** Functional Assessment Tool Used: clinical judgement Functional Limitation: Mobility: Walking and moving around Mobility: Walking and Moving Around Current Status [id number]): At least 60 percent but less than 80 percent impaired, limited or restricted Mobility: Walking  and Moving Around Goal Status [phone]): At least 20 percent but less than 40 percent impaired, limited or restricted    Claretha Cooper 06/26/2016, 12:41 PM Tresa Endo PT [phone]

## 2016-06-26 NOTE — Care Management Note (Signed)
Case Management Note  Patient Details  Name: Briana Thornton MRN: [id number] Date of Birth: [birth year]  Subjective/Objective:                  cough and weakness Action/Plan: Discharge planning Expected Discharge Date:   (unknown)               Expected Discharge Plan:  [place]  In-House Referral:  Clinical Social Work  Discharge planning Services  CM Consult  Post Acute Care Choice:    Choice offered to:  Patient, Adult Children  DME Arranged:  N/A DME Agency:  NA  HH Arranged:  NA HH Agency:  NA  Status of Service:  Completed, signed off  If discussed at [employer] of Stay Meetings, dates discussed:    Additional Comments: CM notes pt to go to SNF; CSW aware and arranging. No other CM needs were communicated. Dellie Catholic, RN 06/26/2016, 2:58 PM

## 2016-06-26 NOTE — Clinical Social Work Placement (Signed)
   CLINICAL SOCIAL WORK PLACEMENT  NOTE  Date:  06/26/2016  Patient Details  Name: Briana Thornton MRN: [id number] Date of Birth: 28-Jan-1927  Clinical Social Work is seeking post-discharge placement for this patient at the [place] level of care (*CSW will initial, date and re-position this form in  chart as items are completed):  Yes   Patient/family provided with [facility] Work Department's list of facilities offering this level of care within the geographic area requested by the patient (or if unable, by the patient's family).  Yes   Patient/family informed of their freedom to choose among providers that offer the needed level of care, that participate in Medicare, Medicaid or managed care program needed by the patient, have an available bed and are willing to accept the patient.  Yes   Patient/family informed of [facility]'s ownership interest in [facility] and [facility], as well as of the fact that they are under no obligation to receive care at these facilities.  PASRR submitted to EDS on       PASRR number received on       Existing PASRR number confirmed on 06/26/16     FL2 transmitted to all facilities in geographic area requested by pt/family on 06/26/16     FL2 transmitted to all facilities within larger geographic area on       Patient informed that his/her managed care company has contracts with or will negotiate with certain facilities, including the following:            Patient/family informed of bed offers received.  Patient chooses bed at       Physician recommends and patient chooses bed at      Patient to be transferred to   on  .  Patient to be transferred to facility by       Patient family notified on   of transfer.  Name of family member notified:        PHYSICIAN       Additional Comment:    _______________________________________________ Luretha Rued, [place] 06/26/2016, 2:29 PM

## 2016-06-26 NOTE — Evaluation (Signed)
Occupational Therapy Evaluation Patient Details Name: Briana Thornton MRN: [id number] DOB: March 11, 1927 Today's Date: 06/26/2016    History of Present Illness Briana Thornton is a 83 y.o. female with a history of CVA, dementia, and HTN brought by her daughter 06/25/16 for evaluation of cough and weakness. negative for flu.   Clinical Impression   Pt was admitted for the above.  Pt had assistance for adls prior to admission, but unsure of her level of participation as family was not present during the evaluation.  Pt will benefit from continued OT to increase safety and independence with adls.  Pt currently need total +2 assistance for Stand/squat pivot transfers and hygiene.  Goals are for max +1 assistance.    Follow Up Recommendations  SNF;Supervision/Assistance - 24 hour (vs)    Equipment Recommendations   (to be further assessed--pt likely has it)    Recommendations for Other Services       Precautions / Restrictions Precautions Precautions: Fall Precaution Comments: incontinence Restrictions Weight Bearing Restrictions: No      Mobility Bed Mobility Overal bed mobility: Needs Assistance Bed Mobility: Supine to Sit;Sit to Supine     Supine to sit: Max assist;HOB elevated Sit to supine: Max assist;+2 for physical assistance;+2 for safety/equipment   General bed mobility comments: assist  with legs and trunk. to sit and return to supine  Transfers Overall transfer level: Needs assistance Equipment used: 2 person hand held assist Transfers: Sit to/from [employer] Sit to Stand: Total assist;+2 physical assistance;+2 safety/equipment   Squat pivot transfers: Total assist;+2 physical assistance;+2 safety/equipment     General transfer comment: assist to pivot to BAC, decreased standing on the legs, tends to grasp bed and rail.  similar assistance to return to bed    Balance Overall balance assessment: History of Falls;Needs assistance Sitting-balance  support: Bilateral upper extremity supported;Feet supported Sitting balance-[name]: Poor Sitting balance - Comments: listing Postural control: Posterior lean                                  ADL Overall ADL's : Needs assistance/impaired Eating/Feeding: Set up;Bed level   Grooming: Wash/dry hands;Minimal assistance;Bed level                   Toilet Transfer: Total assistance;+2 for safety/equipment;+2 for physical assistance;Stand-pivot;BSC   Toileting- Clothing Manipulation and Hygiene: Total assistance;Sitting/lateral lean         General ADL Comments: transferred to 3:1 commode then back to bed.  pt initially held to bed and 3:1 commode rail and needed +2 to release grip so she wasn't resisting.  +2 safety for back to bed and ensured pt did not hold anything to resist.  She did not initiate hygiene but did wipe hands off before meal.       Vision     Perception     Praxis      Pertinent Vitals/Pain Pain Assessment: Faces Faces Pain Scale: Hurts little more Pain Location: back Pain Descriptors / Indicators: Aching Pain Intervention(s): Limited activity within patient's tolerance;Monitored during session;Repositioned     Hand Dominance Left   Extremity/Trunk Assessment Upper Extremity Assessment Upper Extremity Assessment: Overall WFL for tasks assessed      Cervical / Trunk Assessment Cervical / Trunk Assessment: Kyphotic   Communication Communication Communication: No difficulties   Cognition Arousal/Alertness: Awake/alert Behavior During Therapy: WFL for tasks assessed/performed Overall Cognitive Status: History of cognitive impairments -  at baseline                 General Comments: followed one step commands with multimodal cues most of the time.   General Comments       Exercises       Shoulder Instructions      Home Living Family/patient expects to be discharged to:: Unsure Living Arrangements: Children                                Additional Comments: lives with her dtr who is RN Leisure centre manager) at [employer]      Prior Functioning/Environment Level of Independence: Needs assistance  Gait / Transfers Assistance Needed: recently requiring incr assist for transfers at home per [name]     Comments: uses depends        OT Problem List: Decreased strength;Decreased activity tolerance;Impaired balance (sitting and/or standing);Decreased cognition;Pain   OT Treatment/Interventions: Self-care/ADL training;DME and/or AE instruction;Balance training;Patient/family education;Therapeutic activities;Cognitive remediation/compensation    OT Goals(Current goals can be found in the care plan section) Acute Rehab OT Goals Patient Stated Goal: agreed to get up OT Goal Formulation: With patient Time For Goal Achievement: 07/03/16 Potential to Achieve Goals: Good ADL Goals Pt Will Transfer to Toilet: with max assist;squat pivot transfer;stand pivot transfer;bedside commode Pt Will Perform Toileting - Clothing Manipulation and hygiene: with min assist;sitting/lateral leans Additional ADL Goal #1: pt will perform bed mobility at mod A level in preparation for adls  OT Frequency: Min 2X/week   Barriers to D/C:            Co-evaluation   Reason for Co-Treatment: For patient/therapist safety PT goals addressed during session: Mobility/safety with mobility OT goals addressed during session: ADL's and self-care      End of Session Nurse Communication:  (pt set up with lunch )  Activity Tolerance: Patient tolerated treatment well Patient left: in bed;with call bell/phone within reach;with bed alarm set   Time: [id number] OT Time Calculation (min): 26 min Charges:  OT General Charges $OT Visit: 1 Procedure OT Evaluation $OT Eval Low Complexity: 1 Procedure G-Codes: OT G-codes **NOT FOR INPATIENT CLASS** Functional Assessment Tool Used: clinical observation and judgment Functional Limitation:  Self care Self Care Current Status [id number]): At least 1 percent but less than 20 percent impaired, limited or restricted Self Care Goal Status [id number]): At least 60 percent but less than 80 percent impaired, limited or restricted  Author [place] 06/26/2016, 12:41 PM Lesle Chris, OTR/L [phone] 06/26/2016

## 2016-06-26 NOTE — Clinical Social Work Note (Signed)
Clinical Social Work Assessment  Patient Details  Name: Briana Thornton MRN: [id number] Date of Birth: 04-30-1927  Date of referral:  06/19/16               Reason for consult:  Discharge Planning                Permission sought to share information with:  Chartered certified accountant granted to share information::  Yes, Verbal Permission Granted  Name::        Agency::     Relationship::     Contact Information:     Housing/Transportation Living arrangements for the past 2 months:  Single Family Home Source of Information:  Adult Children Patient Interpreter Needed:  None Criminal Activity/Legal Involvement Pertinent to Current Situation/Hospitalization:  No - Comment as needed Significant Relationships:  Adult Children, Other Family Members Lives with:  Adult Children Do you feel safe going back to the place where you live?   (SNF recommended) Need for family participation in patient care:  Yes (Comment)  Care giving concerns: Pt needs more care than is available at home at this time.   Social Worker assessment / plan:  Pt hospitalized from home with family under observation status with Acute encephalopathy/weakness due to viral URI. Pt has dementia and is unable to participate in d/c planning. CSW met with pt's daughter, [name], to review PT recommendations. Daughter is in agreement with recommendations for SNF placement. Daughter is presently working with her brother to move pt to a facility in [place] to be closer to her son. If possible, daughter would like pt to transfer from SNF to facility in [place]. SNF search has been initiated in [facility] and bed offers are pending. CSW will continue to follow to assist with d/c planning needs.  Employment status:  Retired Surveyor, [employer] Care PT Recommendations:  [place] / Referral to community resources:  [place]  Patient/Family's Response to care:   Daughter agrees with plan for SNF.  Patient/Family's Understanding of and Emotional Response to Diagnosis, Current Treatment, and Prognosis:  Daughter is aware of pt's medical status. She appreciates CSW assistance with d/c planning.  Emotional Assessment Appearance:  Appears stated age Attitude/Demeanor/Rapport:  Other (cooperative) Affect (typically observed):  Pleasant, Appropriate Orientation:  Oriented to Self, Oriented to Place Alcohol / Substance use:  Not Applicable Psych involvement (Current and /or in the community):  No (Comment)  Discharge Needs  Concerns to be addressed:  Discharge Planning Concerns Readmission within the last 30 days:  No Current discharge risk:  None Barriers to Discharge:  No Barriers Identified   Luretha Rued, [place] 06/26/2016, 2:21 PM

## 2016-06-26 NOTE — Discharge Summary (Addendum)
Physician Discharge Summary  Briana Thornton [id number] DOB: 06-24-1926 DOA: 06/25/2016  PCP: Penni Homans, MD  Admit date: 06/25/2016 Discharge date: 06/27/2016  Time spent: 35 minutes  Recommendations for Outpatient Follow-up:  1. Repeat BMET in 5 days to follow electrolytes and renal function   Discharge Diagnoses:  Principal Problem:   Influenza-like illness Active Problems:   Essential hypertension   Hyperlipidemia   Weakness   Dehydration, mild   Dementia   Discharge Condition: stable and improved. Will discharge to SNF for rehabilitation.  Diet recommendation: regular diet and feeding supplements.  Filed Weights   06/25/16 0936  Weight: 46.3 kg (102 lb)    History of present illness:  83 y.o. female with a history of CVA, dementia, and HTN brought by her daughter for evaluation of cough and weakness. 4 days ago she gradually developed mild-moderate intermittent nonproductive cough associated with nasal congestion and rhinorrhea. OTC treatment have not significantly improved symptoms. She has lived in a garage apartment for the past 5 years with her daughter who is a Marine scientist and has been assisting her with ADLs and mobility for several years. She reports significant functional decline with this illness, like others in the past, to the point that the patient is unable to support her own weight for transfers and would nearly fall off the bedside commode due to weakness. Her per oral intake has been minimal despite significant encouragement from her daughter. Mentation has been abnormal for the past 3 days.   Hospital Course:  Acute encephalopathy/weakness due to viral URI: Due to level of baseline frailty, she is at very high risk for decompensation.  -improved with fluid resuscitation and supportive care -influenza panel neg -PT/OT has evaluated patient and recommended SNF -CSW consulted and will arranged SNF for rehabilitation -continue [place] nasal spray and  flonase -Combivent prn  Dementia/failure to thrive/severe protein calorie malnutrition: with worsening on underlying mentation on admission. Most likely associated with dehydration. -Continue aricept -SLP evaluation given daughter's concern for difficulty swallowing; was passed and tolerating diet w/o problems now (just requiring increase encouragement). -continue feeding supplements   HTN:  -will discontinue lisinopril, given poor intake, dehydration and increase risk for AKI -will discharge on amlodipine   H/o CVA: No new focal deficits. Not on antiplatelet, formerly on aspirin which has been stopped secondary to worsening GERD.. - Continue lipitor  HLD -continue statins  Procedures:  See below for x-ray reports   Consultations:  PT/OT and CSW  Discharge Exam: Vitals:   06/26/16 0931 06/26/16 1439  BP: (!) 160/76 (!) 131/48  Pulse: 71 68  Resp: 15 16  Temp: 97.7 F (36.5 C) 98.3 F (36.8 C)    General: afebrile, no CP, oriented to person only. Able to follow commands. Very weak, frail and still with poor overall intake. Skin turgor is decrease, dry mouth and sunken eyes on exam. Cardiovascular: S1 and S2, no rubs, no gallops, positive SEM Respiratory: good air movement, no wheezing, positive scattered rhonchi Abd: soft, NT, positive BS Extremities: thin, no edema or cyanosis  Skin: positive stage 1 left buttocks pressure injury, no open wounds or further skin disruption appreciated. (present Prior to admission) Neuro: no focal neurologic or motor deficit; MS 3/5 bilaterally, poor insight, oriented X1.   Discharge Instructions  Current Discharge Medication List    START taking these medications   Details  amLODipine (NORVASC) 5 MG tablet Take 1 tablet (5 mg total) by mouth daily.      CONTINUE these medications which have  NOT CHANGED   Details  acetaminophen (TYLENOL) 500 MG tablet Take 1,000 mg by mouth every 8 (eight) hours as needed for mild pain,  moderate pain or headache.     alum & mag hydroxide-simeth (MAALOX ADVANCED) 200-200-20 MG/5ML suspension Take 30 mLs by mouth every 6 (six) hours as needed for indigestion or heartburn.    atorvastatin (LIPITOR) 10 MG tablet TAKE 1 TABLET BY MOUTH EVERY DAY Qty: 90 tablet, Refills: 2    bisacodyl (DULCOLAX) 10 MG suppository Place 1 suppository (10 mg total) rectally daily as needed for moderate constipation. Qty: 12 suppository, Refills: 0    docusate sodium (COLACE) 100 MG capsule Take 100 mg by mouth daily as needed (constipation).     donepezil (ARICEPT) 10 MG tablet TAKE 1 TABLET (10 MG TOTAL) BY MOUTH AT BEDTIME. Qty: 90 tablet, Refills: 3    fluticasone (FLONASE) 50 MCG/ACT nasal spray Place 2 sprays into both nostrils daily as needed for allergies or rhinitis.    Ipratropium-Albuterol (COMBIVENT RESPIMAT) 20-100 MCG/ACT AERS respimat Inhale 2 puffs into the lungs 4 (four) times daily.    lactose free nutrition (BOOST) LIQD Take 237 mLs by mouth daily as needed (poor appetite).     loratadine (CLARITIN) 10 MG tablet Take 1 tablet (10 mg total) by mouth daily.    methylcellulose (ARTIFICIAL TEARS) 1 % ophthalmic solution Place 1 drop into both eyes daily as needed (dry eyes).     polyethylene glycol (MIRALAX / GLYCOLAX) packet Take 17 g by mouth daily as needed (constipation).    Probiotic Product (PROBIOTIC DAILY PO) Take 1 capsule by mouth daily.       STOP taking these medications     lisinopril (PRINIVIL,ZESTRIL) 10 MG tablet      cefdinir (OMNICEF) 300 MG capsule        No Known Allergies   The results of significant diagnostics from this hospitalization (including imaging, microbiology, ancillary and laboratory) are listed below for reference.    Significant Diagnostic Studies: Dg Chest 2 View  Result Date: 06/25/2016 CLINICAL DATA:  Cough. EXAM: CHEST  2 VIEW COMPARISON:  10/01/2015 . FINDINGS: Cardiac pacer with lead tip over the right atrium right  ventricle. Heart size normal. Persistent changes of left base scarring. Calcified nodular densities are noted on the left consistent granulomas. No pleural effusion or pneumothorax. IMPRESSION: Cardiac pacer in stable position. No acute cardiopulmonary disease . Electronically Signed   By: Marcello Moores  Register   On: 06/25/2016 11:57    Microbiology: No results found for this or any previous visit (from the past 240 hour(s)).   Labs: Basic Metabolic Panel:  Recent Labs Lab 06/25/16 1123 06/26/16 0431  NA 143 141  K 3.8 3.7  CL 106 107  CO2 29 26  GLUCOSE 123* 86  BUN 26* 17  CREATININE 0.92 1.01*  CALCIUM 9.5 8.6*   CBC:  Recent Labs Lab 06/25/16 1123  WBC 9.0  HGB 12.7  HCT 40.4  MCV 90.6  PLT 219   BNP (last 3 results)  Recent Labs  09/30/15 1240  BNP 121.5*   CBG:  Recent Labs Lab 06/25/16 1055  GLUCAP 117*    Signed:  Barton Dubois MD.  [facility] 06/26/2016, 6:08 PM

## 2016-06-26 NOTE — Progress Notes (Signed)
Initial Nutrition Assessment  DOCUMENTATION CODES:   Not applicable  INTERVENTION:  - Will order Ensure Enlive BID, each supplement provides 350 kcal and 20 grams of protein - Will order daily multivitamin with minerals.  - RD will follow-up 2/1.  NUTRITION DIAGNOSIS:   Inadequate oral intake related to lethargy/confusion, acute illness as evidenced by per patient/family report.  GOAL:   Patient will meet greater than or equal to 90% of their needs  MONITOR:   PO intake, Supplement acceptance, Weight trends, Labs, Skin, I & O's  REASON FOR ASSESSMENT:   Consult Assessment of nutrition requirement/status  ASSESSMENT:   83 y.o. female with a history of CVA, dementia, and HTN brought by her daughter for evaluation of cough and weakness. 4 days ago she gradually developed mild-moderate intermittent nonproductive cough associated with nasal congestion and rhinorrhea. OTC treatment have not significantly improved symptoms. She has lived in a garage apartment for the past 5 years with her daughter who is a Marine scientist and has been assisting her with ADLs and mobility for several years. She reports significant functional decline with this illness, like others in the past, to the point that the patient is unable to support her own weight for transfers and would nearly fall off the bedside commode due to weakness. Her per oral intake has been minimal despite significant encouragement from her daughter. Mentation has been abnormal for the past 3 days and remains abnormal prompting an ED visit.   Pt seen for consult. BMI indicates normal weight/borderline underweight. Per chart review, pt consumed 55% of breakfast this AM. Unable to talk with pt/family but will follow-up on 2/1 to obtain all information and to perform physical assessment at that time. Per chart review, pt's weight has been stable since September 2017 but will need to confirm with family at follow-up.   Will order items as outlined  above to assist in meeting estimated needs and d/t report of poor intakes PTA.  Medications reviewed; PRN Maalox, PRN Dulcolax, PRN Miralax. Labs reviewed; creatinine: 1.01 mg/dL, Ca: 8.6 mg/dL, GFR: 48 mL/min.  IVF: 1/2 NS @ 75 mL/hr.    Diet Order:  Diet regular Room service appropriate? Yes; Fluid consistency: Thin  Skin:  Wound (see comment) (Stage 1 L buttocks pressure injury)  Last BM:  1/27 (PTA)  Height:   Ht Readings from Last 1 Encounters:  06/25/16 5\' 2"  (1.575 m)    Weight:   Wt Readings from Last 1 Encounters:  06/25/16 102 lb (46.3 kg)    Ideal Body Weight:  50 kg  BMI:  Body mass index is 18.66 kg/m.  Estimated Nutritional Needs:   Kcal:  1160-1340 (25-29 kcal/kg)  Protein:  46-56 grams (1-1.2 grams/kg)  Fluid:  >/= 1.5 L/day  EDUCATION NEEDS:   No education needs identified at this time    Jarome Matin, MS, RD, LDN, CNSC Inpatient Clinical Dietitian Pager # [phone] After hours/weekend pager # [phone]

## 2016-06-26 NOTE — Care Management Obs Status (Signed)
[facility] NOTIFICATION   Patient Details  Name: Briana Thornton MRN: [id number] Date of Birth: 1926-10-16   Medicare Observation Status Notification Given:  Yes    Dellie Catholic, RN 06/26/2016, 2:57 PM

## 2016-06-27 DIAGNOSIS — R69 Illness, unspecified: Secondary | ICD-10-CM

## 2016-06-27 MED ORDER — LIP MEDEX EX OINT
TOPICAL_OINTMENT | CUTANEOUS | Status: AC
  Administered 2016-06-27: 1
  Filled 2016-06-27: qty 7

## 2016-06-27 NOTE — Progress Notes (Signed)
Patient seen and examined this morning. She is sitting in chair, eating oatmeal. No complaints this morning and no acute events overnight. Case discussed with daughter as well as social work. Patient to discharge to SNF today.   Dessa Phi, DO [facility] [email or web address] Password [facility] 06/27/2016, 8:56 AM

## 2016-06-27 NOTE — Progress Notes (Signed)
SLP Cancellation Note  Patient Details Name: Briana Thornton MRN: [id number] DOB: [birth year]   Cancelled Evaluation:       Reason Eval/Treat Not Completed:  Chart reviewed, history taken. Spoke with pt's daugther Cabin crew on floor) and Therapist, sports. Daughter politely declines evaluation at this time. Per daughter and RN, pt has returned to baseline. She is eating and taking meds without difficulty, and requires cues for slow rate and small bolus size. Daughter reports "she does get strangled occasionally, but this is her baseline". Pt is being DC'd to [facility] today for rehab. Recommend follow up with SLP at next venue if needs arise. Acute ST will sign off at this time, but available if needed in the future.  Celia B. Quentin Ore [facility], CCC-SLP [id number]  Shonna Chock 06/27/2016, 10:10 AM

## 2016-06-27 NOTE — Progress Notes (Signed)
Physical Therapy Treatment Patient Details Name: Briana Thornton MRN: [id number] DOB: July 20, 1926 Today's Date: 06/27/2016    History of Present Illness Briana Thornton is a 83 y.o. female with a history of CVA, dementia, and HTN brought by her daughter 06/25/16 for evaluation of cough and weakness. negative for flu.    PT Comments    Pt progressing, overall incr pt effort and decr assist today, sitting balance is improved; continue to recommend SNF unless pt/family desire HHPT; will continue to follow in acute setting  Follow Up Recommendations  SNF (unless family desires HHPT)     Equipment Recommendations  None recommended by PT    Recommendations for Other Services       Precautions / Restrictions Precautions Precautions: Fall Precaution Comments: incontinence Restrictions Weight Bearing Restrictions: No    Mobility  Bed Mobility Overal bed mobility: Needs Assistance Bed Mobility: Sit to Supine       Sit to supine: Mod assist;+2 for physical assistance;+2 for safety/equipment   General bed mobility comments: assist  with legs and trunk, positioning in supine  Transfers Overall transfer level: Needs assistance Equipment used: Rolling walker (2 wheeled) Transfers: Sit to/from [facility] Sit to Stand: Max assist;+2 physical assistance;+2 safety/equipment Stand pivot transfers: Max assist;+2 physical assistance;+2 safety/equipment       General transfer comment: posterior bias in standing, multi-modal cues and assist to correct midline and bring COG over BOS; incr time--incr needed  Ambulation/Gait Ambulation/Gait assistance: +2 physical assistance;Mod assist Ambulation Distance (Feet): 5 Feet Assistive device: Rolling walker (2 wheeled) Gait Pattern/deviations: Step-to pattern;Shuffle     General Gait Details: assist for balance, multi-modal cues for posture, sequence, incr step length--pivotal steps to [facility] and steps back to chair   Stairs            Wheelchair Mobility    Modified Rankin (Stroke Patients Only)       Balance   Sitting-balance support: Single extremity supported;Feet supported Sitting balance-[name]: Fair Sitting balance - Comments: able to maintain static stand EOB with supervision and initiate scooting without LOB   Standing balance support: During functional activity;Bilateral upper extremity supported Standing balance-[name]: Poor                      Cognition Arousal/Alertness: Awake/alert Behavior During Therapy: WFL for tasks assessed/performed Overall Cognitive Status: History of cognitive impairments - at baseline                 General Comments: followed one step commands with multimodal cues, incr time    Exercises      General Comments        Pertinent Vitals/Pain Faces Pain Scale: Hurts little more Pain Location: back Pain Descriptors / Indicators: Grimacing Pain Intervention(s): Limited activity within patient's tolerance;Monitored during session    Home Living                      Prior Function            PT Goals (current goals can now be found in the care plan section) Acute Rehab PT Goals Patient Stated Goal: agreed to try [facility] and get back to bed PT Goal Formulation: Patient unable to participate in goal setting Time For Goal Achievement: 07/03/16 Potential to Achieve Goals: Good Progress towards PT goals: Progressing toward goals    Frequency    Min 3X/week      PT Plan Current plan remains appropriate  Co-evaluation             End of Session Equipment Utilized During Treatment: Gait belt Activity Tolerance: Patient tolerated treatment well Patient left: in bed;with bed alarm set;with call bell/phone within reach     Time: 1031-1048 PT Time Calculation (min) (ACUTE ONLY): 17 min  Charges:  $Therapeutic Activity: 8-22 mins                    G Codes:      Briana Thornton July 19, 2016, 11:33 AM

## 2016-06-27 NOTE — Plan of Care (Signed)
Problem: Health Behavior/Discharge Planning: Goal: Ability to manage health-related needs will improve Outcome: Not Progressing Family involved in care

## 2016-06-27 NOTE — Clinical Social Work Placement (Signed)
   CLINICAL SOCIAL WORK PLACEMENT  NOTE  Date:  06/27/2016  Patient Details  Name: Briana Thornton MRN: [id number] Date of Birth: 11/18/1926  Clinical Social Work is seeking post-discharge placement for this patient at the [place] level of care (*CSW will initial, date and re-position this form in  chart as items are completed):  Yes   Patient/family provided with [place] Work Department's list of facilities offering this level of care within the geographic area requested by the patient (or if unable, by the patient's family).  Yes   Patient/family informed of their freedom to choose among providers that offer the needed level of care, that participate in Medicare, Medicaid or managed care program needed by the patient, have an available bed and are willing to accept the patient.  Yes   Patient/family informed of [facility]'s ownership interest in [facility] and [facility], as well as of the fact that they are under no obligation to receive care at these facilities.  PASRR submitted to EDS on       PASRR number received on       Existing PASRR number confirmed on 06/26/16     FL2 transmitted to all facilities in geographic area requested by pt/family on 06/26/16     FL2 transmitted to all facilities within larger geographic area on       Patient informed that his/her managed care company has contracts with or will negotiate with certain facilities, including the following:        Yes   Patient/family informed of bed offers received.  Patient chooses bed at [facility]     Physician recommends and patient chooses bed at      Patient to be transferred to [facility] on 06/27/16.  Patient to be transferred to facility by [facility]     Patient family notified on 06/27/16 of transfer.  Name of family member notified:  DAUGHTER     PHYSICIAN       Additional Comment: Pt / daughter are in agreement with  d/c to [facility] today. [facility] transport is needed. Daughter is aware out of pocket costs may be assciated with [facility] transport.D/C Summary sent to SNF for review. No scripts printed. # for report provided to nsg.   _______________________________________________ Luretha Rued, [place]  [phone] 06/27/2016, 12:09 PM

## 2016-06-29 ENCOUNTER — Telehealth: Payer: Self-pay | Admitting: Behavioral Health

## 2016-06-29 NOTE — Telephone Encounter (Signed)
Per daughter Butler Denmark), the patient is currently at [facility] post hospitalization.

## 2016-07-24 ENCOUNTER — Other Ambulatory Visit: Payer: Self-pay | Admitting: *Deleted

## 2016-07-24 NOTE — Patient Outreach (Addendum)
[facility]) Care Management  07/24/2016  Briana Thornton [birth year] [id number]   Met with SW at facility regarding discharge plan.  Plan is for patient to discharge to facility in [place] to be near daughter.  Plan to sign off as no [facility] care management needs at this time. [name]. [name], RN, BSN, [place] [phone]) Business Cell  [phone]) Toll Free Office

## 2016-08-05 ENCOUNTER — Other Ambulatory Visit: Payer: Self-pay | Admitting: Family Medicine

## 2016-08-05 DIAGNOSIS — I10 Essential (primary) hypertension: Secondary | ICD-10-CM

## 2016-08-06 NOTE — Telephone Encounter (Signed)
I would have them hold the lisinopril til she can be reevaluated by a doctor and her bp and renal function can be checked

## 2016-08-06 NOTE — Telephone Encounter (Signed)
Please advise.  Lisinopril was dc'd on recent hospital discharge and is not on current med list. Pt discharged to SNF and per chart review will be going to facility in [place].

## 2016-08-10 ENCOUNTER — Telehealth: Payer: Self-pay

## 2016-08-10 NOTE — Telephone Encounter (Signed)
LVM for patient. Need to schedule a device clinic appt. - earliest available for a device check AND a new merlin monitor.

## 2016-08-27 ENCOUNTER — Telehealth: Payer: Self-pay

## 2016-08-27 NOTE — Telephone Encounter (Signed)
Spoke with pts daughter, she stated that pt is moving to [place] into a skilled nursing facility there in the next two weeks. Pt daughter stated that she was in the process of requesting records in order to get pt established there. Informed pts daughter to call if any further information is needed.

## 2016-09-04 DIAGNOSIS — I10 Essential (primary) hypertension: Secondary | ICD-10-CM | POA: Diagnosis not present

## 2016-09-04 DIAGNOSIS — R1311 Dysphagia, oral phase: Secondary | ICD-10-CM | POA: Diagnosis not present

## 2016-09-04 DIAGNOSIS — E782 Mixed hyperlipidemia: Secondary | ICD-10-CM | POA: Diagnosis not present

## 2016-09-05 ENCOUNTER — Telehealth: Payer: Self-pay | Admitting: Family Medicine

## 2016-09-05 NOTE — Telephone Encounter (Signed)
Left pt message asking to call Allison back directly at [phone] to schedule AWV. Thanks! °

## 2016-09-13 DIAGNOSIS — I10 Essential (primary) hypertension: Secondary | ICD-10-CM | POA: Diagnosis not present

## 2016-09-13 DIAGNOSIS — E782 Mixed hyperlipidemia: Secondary | ICD-10-CM | POA: Diagnosis not present

## 2016-09-13 DIAGNOSIS — R1311 Dysphagia, oral phase: Secondary | ICD-10-CM | POA: Diagnosis not present

## 2016-09-20 DIAGNOSIS — I10 Essential (primary) hypertension: Secondary | ICD-10-CM | POA: Diagnosis not present

## 2016-09-20 DIAGNOSIS — E782 Mixed hyperlipidemia: Secondary | ICD-10-CM | POA: Diagnosis not present

## 2016-09-20 DIAGNOSIS — R1311 Dysphagia, oral phase: Secondary | ICD-10-CM | POA: Diagnosis not present

## 2016-09-27 DIAGNOSIS — E782 Mixed hyperlipidemia: Secondary | ICD-10-CM | POA: Diagnosis not present

## 2016-09-27 DIAGNOSIS — R1311 Dysphagia, oral phase: Secondary | ICD-10-CM | POA: Diagnosis not present

## 2016-09-27 DIAGNOSIS — I10 Essential (primary) hypertension: Secondary | ICD-10-CM | POA: Diagnosis not present

## 2016-10-11 DIAGNOSIS — I10 Essential (primary) hypertension: Secondary | ICD-10-CM | POA: Diagnosis not present

## 2016-10-11 DIAGNOSIS — R1311 Dysphagia, oral phase: Secondary | ICD-10-CM | POA: Diagnosis not present

## 2016-10-11 DIAGNOSIS — E782 Mixed hyperlipidemia: Secondary | ICD-10-CM | POA: Diagnosis not present

## 2016-10-24 NOTE — Telephone Encounter (Signed)
Left pt message asking to call Allison back directly at [phone] to schedule AWV. Thanks! °

## 2016-10-24 NOTE — Telephone Encounter (Signed)
Pt daughter called back. She has moved pt to a facility out of state and will no longer be coming to [place] for care.  PCP removed from Epic

## 2016-10-25 DIAGNOSIS — R1311 Dysphagia, oral phase: Secondary | ICD-10-CM | POA: Diagnosis not present

## 2016-11-08 DIAGNOSIS — I10 Essential (primary) hypertension: Secondary | ICD-10-CM | POA: Diagnosis not present

## 2016-11-22 DIAGNOSIS — J00 Acute nasopharyngitis [common cold]: Secondary | ICD-10-CM | POA: Diagnosis not present

## 2016-11-22 DIAGNOSIS — R1311 Dysphagia, oral phase: Secondary | ICD-10-CM | POA: Diagnosis not present

## 2016-11-22 DIAGNOSIS — E782 Mixed hyperlipidemia: Secondary | ICD-10-CM | POA: Diagnosis not present

## 2016-11-22 DIAGNOSIS — I10 Essential (primary) hypertension: Secondary | ICD-10-CM | POA: Diagnosis not present

## 2016-12-06 DIAGNOSIS — J00 Acute nasopharyngitis [common cold]: Secondary | ICD-10-CM | POA: Diagnosis not present

## 2016-12-06 DIAGNOSIS — E782 Mixed hyperlipidemia: Secondary | ICD-10-CM | POA: Diagnosis not present

## 2016-12-06 DIAGNOSIS — R1311 Dysphagia, oral phase: Secondary | ICD-10-CM | POA: Diagnosis not present

## 2016-12-06 DIAGNOSIS — I10 Essential (primary) hypertension: Secondary | ICD-10-CM | POA: Diagnosis not present

## 2016-12-20 DIAGNOSIS — R1311 Dysphagia, oral phase: Secondary | ICD-10-CM | POA: Diagnosis not present

## 2016-12-20 DIAGNOSIS — I10 Essential (primary) hypertension: Secondary | ICD-10-CM | POA: Diagnosis not present

## 2016-12-20 DIAGNOSIS — E782 Mixed hyperlipidemia: Secondary | ICD-10-CM | POA: Diagnosis not present

## 2016-12-20 DIAGNOSIS — J00 Acute nasopharyngitis [common cold]: Secondary | ICD-10-CM | POA: Diagnosis not present

## 2017-01-03 DIAGNOSIS — J00 Acute nasopharyngitis [common cold]: Secondary | ICD-10-CM | POA: Diagnosis not present

## 2017-01-03 DIAGNOSIS — E782 Mixed hyperlipidemia: Secondary | ICD-10-CM | POA: Diagnosis not present

## 2017-01-03 DIAGNOSIS — I10 Essential (primary) hypertension: Secondary | ICD-10-CM | POA: Diagnosis not present

## 2017-01-03 DIAGNOSIS — R1311 Dysphagia, oral phase: Secondary | ICD-10-CM | POA: Diagnosis not present

## 2017-01-17 DIAGNOSIS — I10 Essential (primary) hypertension: Secondary | ICD-10-CM | POA: Diagnosis not present

## 2017-01-17 DIAGNOSIS — R1311 Dysphagia, oral phase: Secondary | ICD-10-CM | POA: Diagnosis not present

## 2017-01-17 DIAGNOSIS — E782 Mixed hyperlipidemia: Secondary | ICD-10-CM | POA: Diagnosis not present

## 2017-01-31 DIAGNOSIS — I10 Essential (primary) hypertension: Secondary | ICD-10-CM | POA: Diagnosis not present

## 2017-01-31 DIAGNOSIS — E782 Mixed hyperlipidemia: Secondary | ICD-10-CM | POA: Diagnosis not present

## 2017-01-31 DIAGNOSIS — R1311 Dysphagia, oral phase: Secondary | ICD-10-CM | POA: Diagnosis not present

## 2017-02-14 DIAGNOSIS — E782 Mixed hyperlipidemia: Secondary | ICD-10-CM | POA: Diagnosis not present

## 2017-02-14 DIAGNOSIS — R1311 Dysphagia, oral phase: Secondary | ICD-10-CM | POA: Diagnosis not present

## 2017-02-14 DIAGNOSIS — I10 Essential (primary) hypertension: Secondary | ICD-10-CM | POA: Diagnosis not present

## 2017-02-28 DIAGNOSIS — E782 Mixed hyperlipidemia: Secondary | ICD-10-CM | POA: Diagnosis not present

## 2017-02-28 DIAGNOSIS — I10 Essential (primary) hypertension: Secondary | ICD-10-CM | POA: Diagnosis not present

## 2017-02-28 DIAGNOSIS — R1311 Dysphagia, oral phase: Secondary | ICD-10-CM | POA: Diagnosis not present

## 2017-10-04 IMAGING — DX DG CHEST 2V
2 series · 2 of 2 positions shown · non-contrast
Comparison: March 06, 2015

CLINICAL DATA: Chronic cough.  Hypertension.

EXAM:
CHEST  2 VIEW

[chest pa]
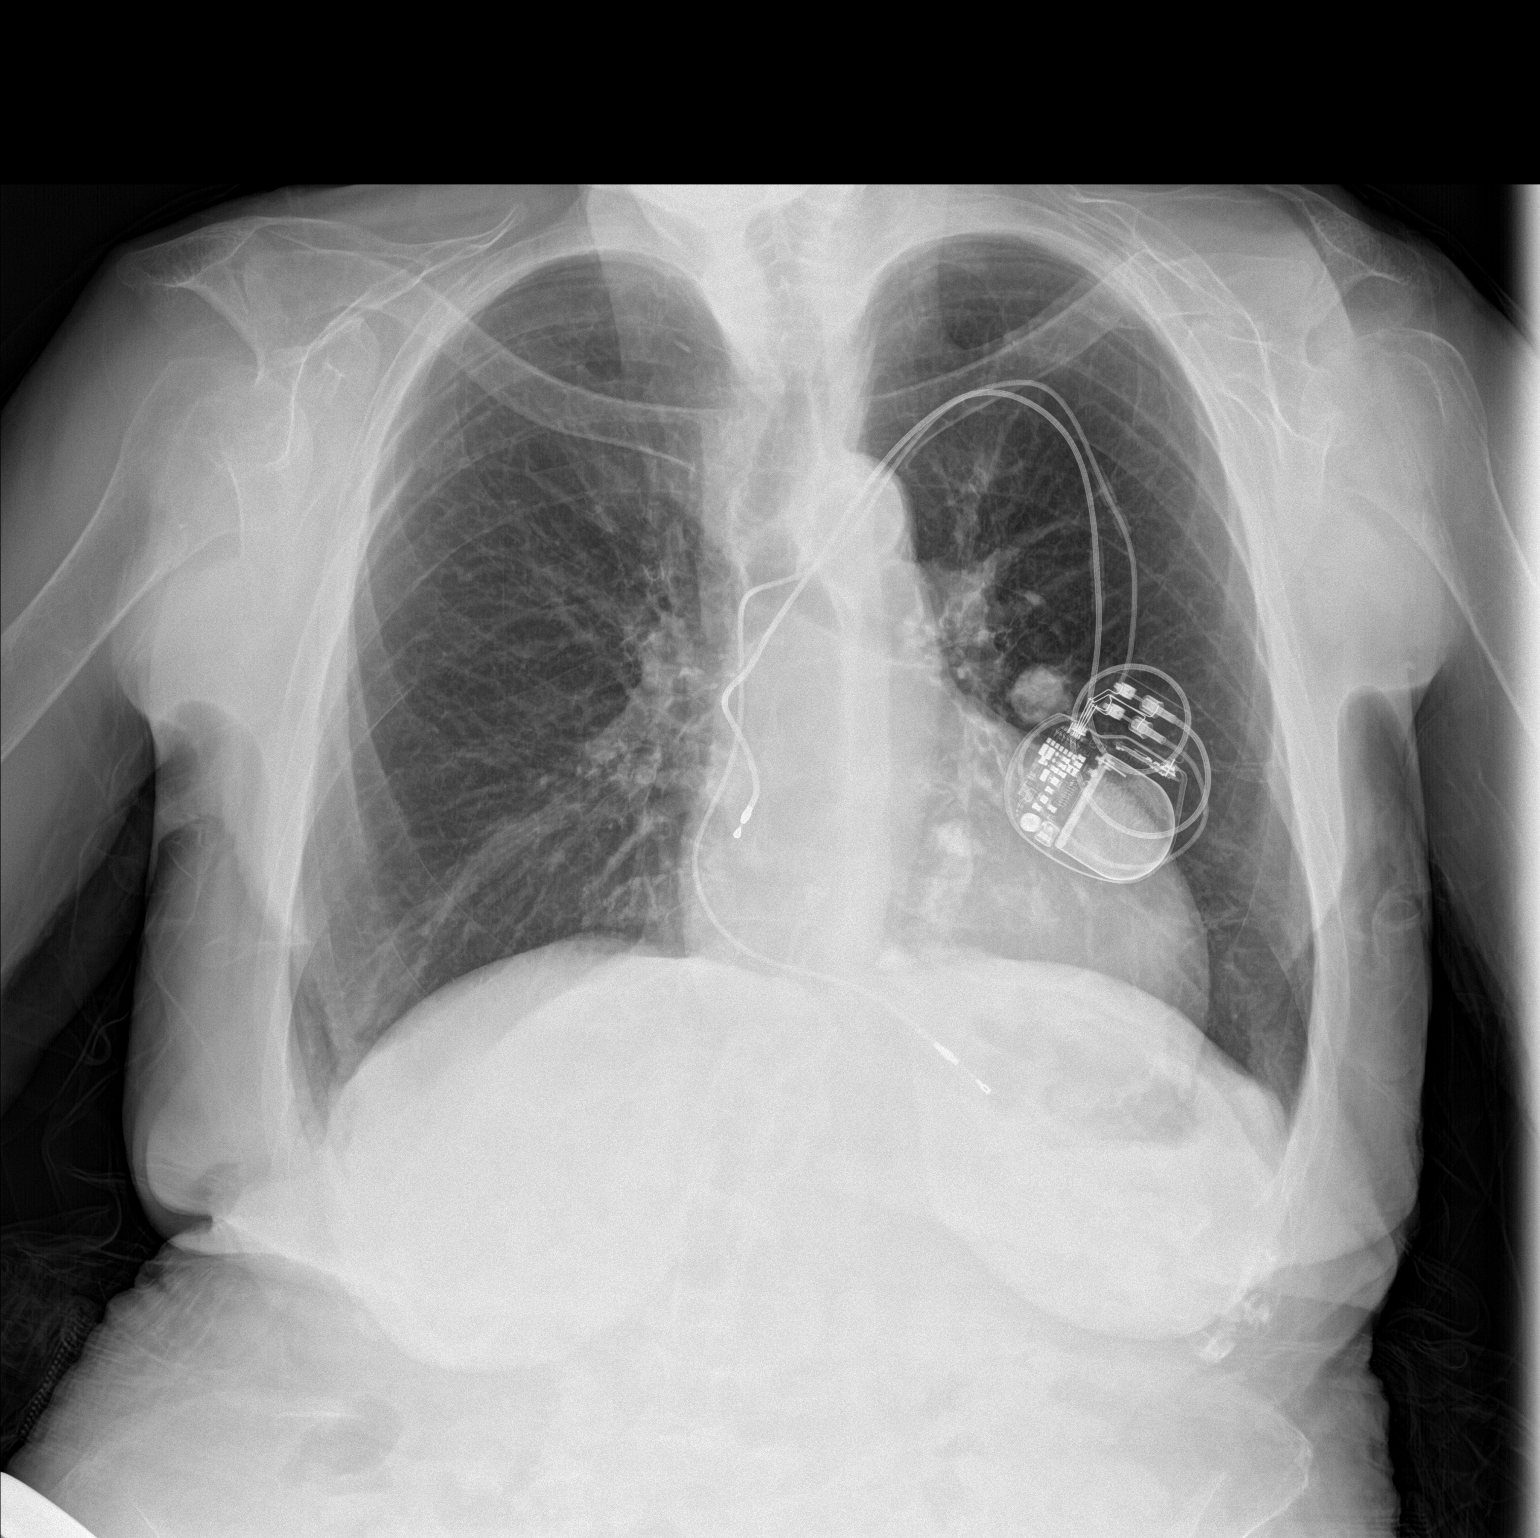

[chest lat]
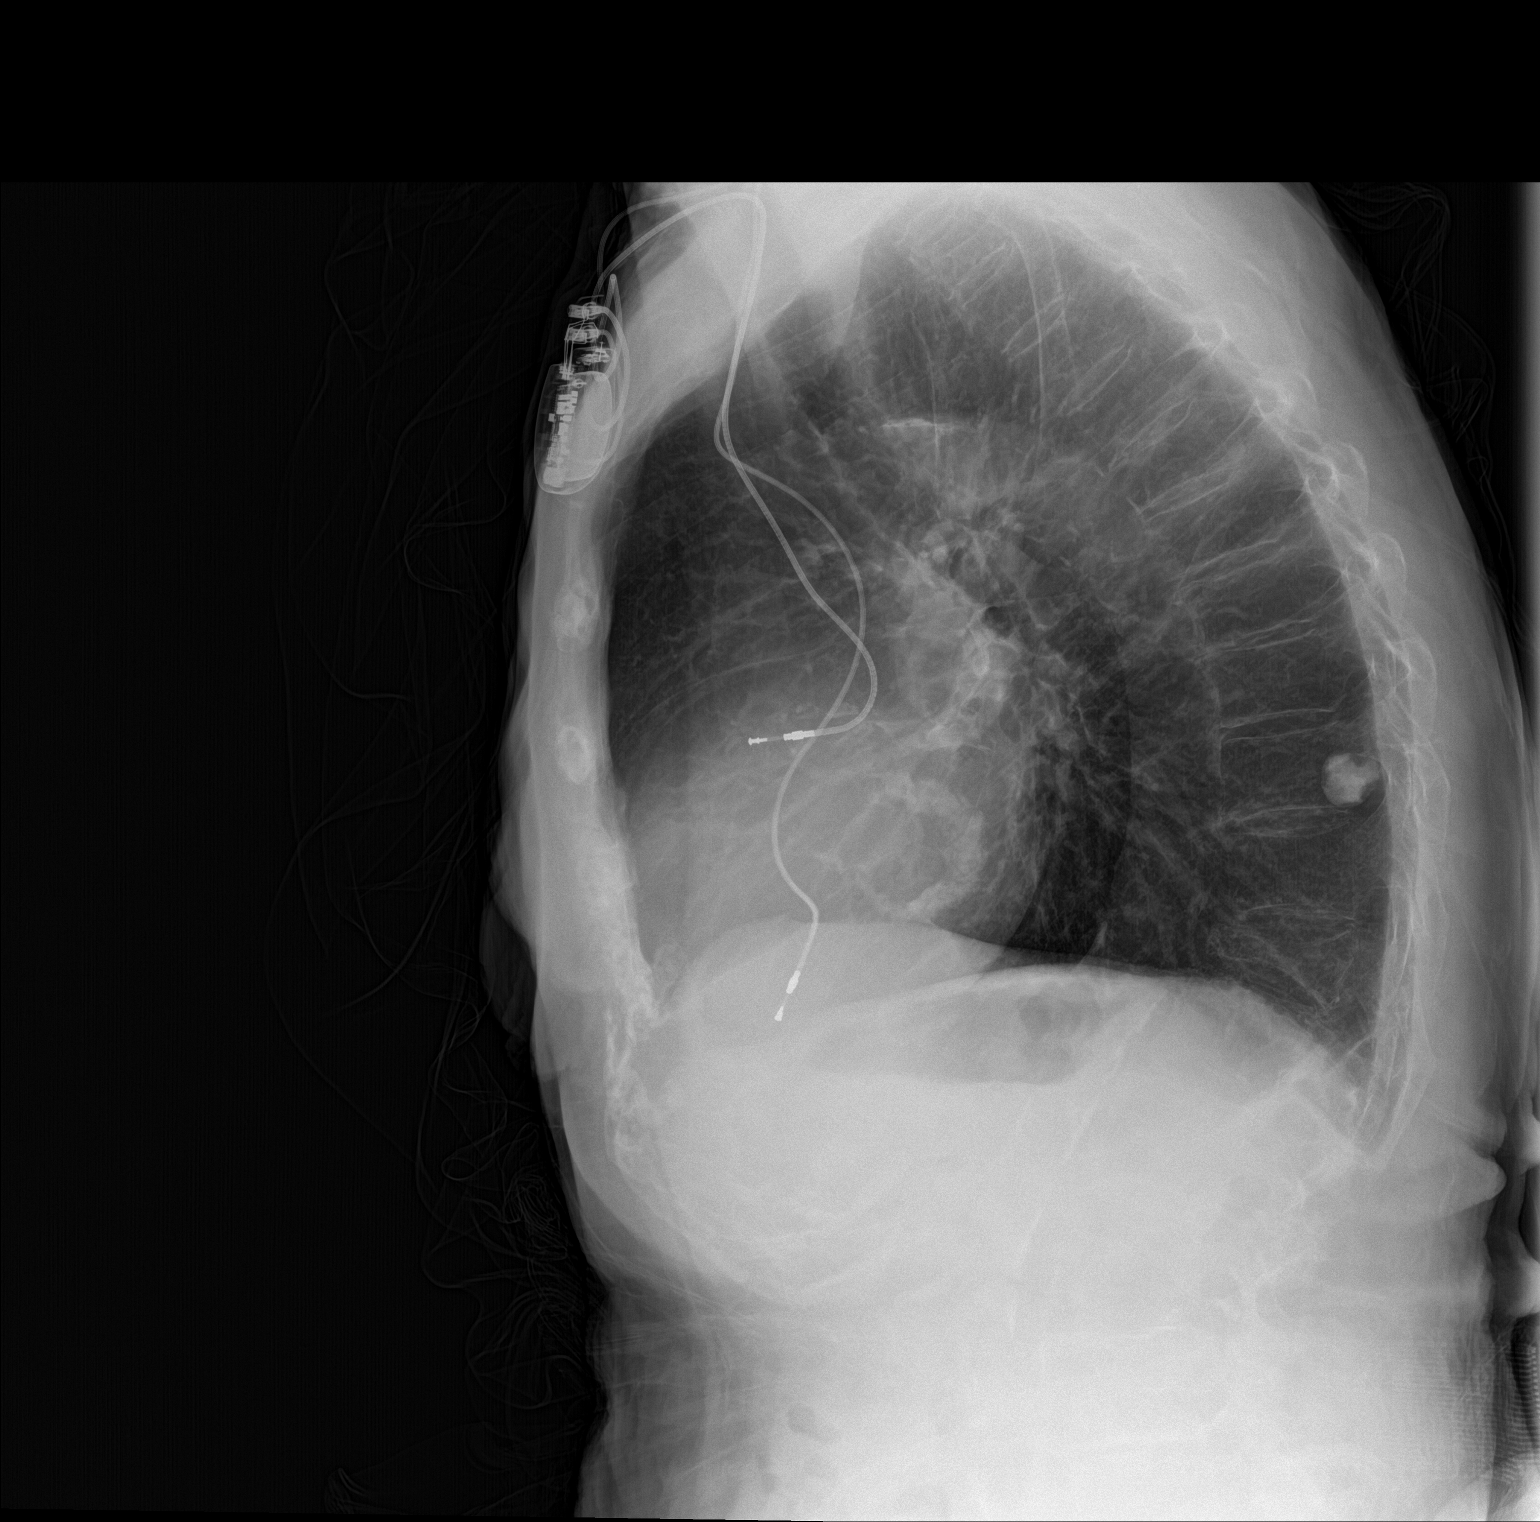

[2 of 2 positions shown; findings below may reference images not displayed]

FINDINGS: There is a calcified granuloma in the left lower lobe. There is no
edema or consolidation. Heart size and pulmonary vascular normal.
There is extensive mitral annulus calcification. Pacemaker leads are
attached to the right atrium and middle cardiac vein regions. No
adenopathy. There is atherosclerotic calcification throughout the
aorta. There is stable collapse of a lower thoracic vertebral body.
Bones are osteoporotic.
IMPRESSION: No edema or consolidation. Calcified granuloma left lower lobe. No
change in cardiac silhouette. Marked collapse of a lower thoracic
vertebral body present. Bones osteoporotic.

## 2017-11-08 IMAGING — RF DG HIP (WITH PELVIS) OPERATIVE*L*
1 series · 2 of 2 positions shown · non-contrast
Comparison: Radiograph 05/30/2015.

CLINICAL DATA: LEFT femur fracture

EXAM:
OPERATIVE LEFT HIP (WITH PELVIS IF PERFORMED) 2 new VIEWS
TECHNIQUE: Fluoroscopic spot image(s) were submitted for interpretation
post-operatively.

[Series 1: run · 2 of 2 slices shown]
[im 1/2]
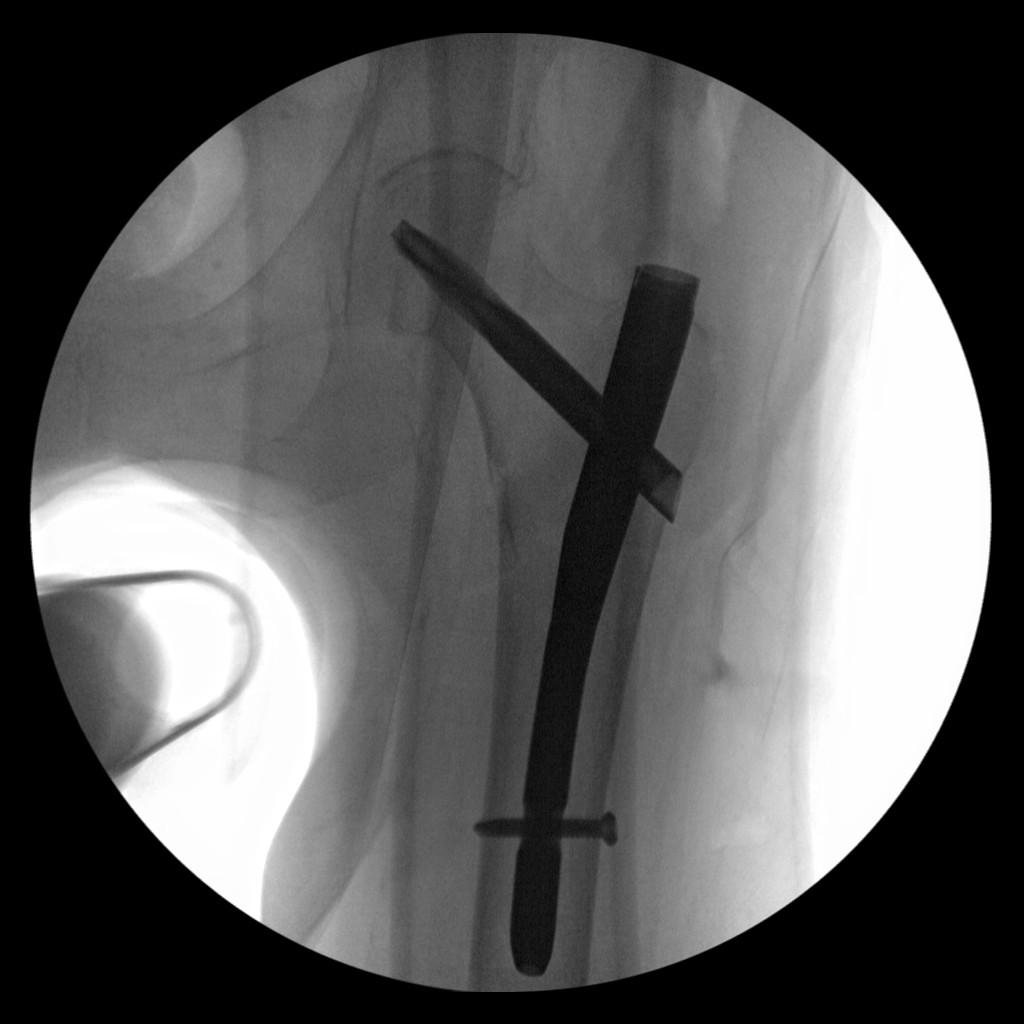
[im 2/2]
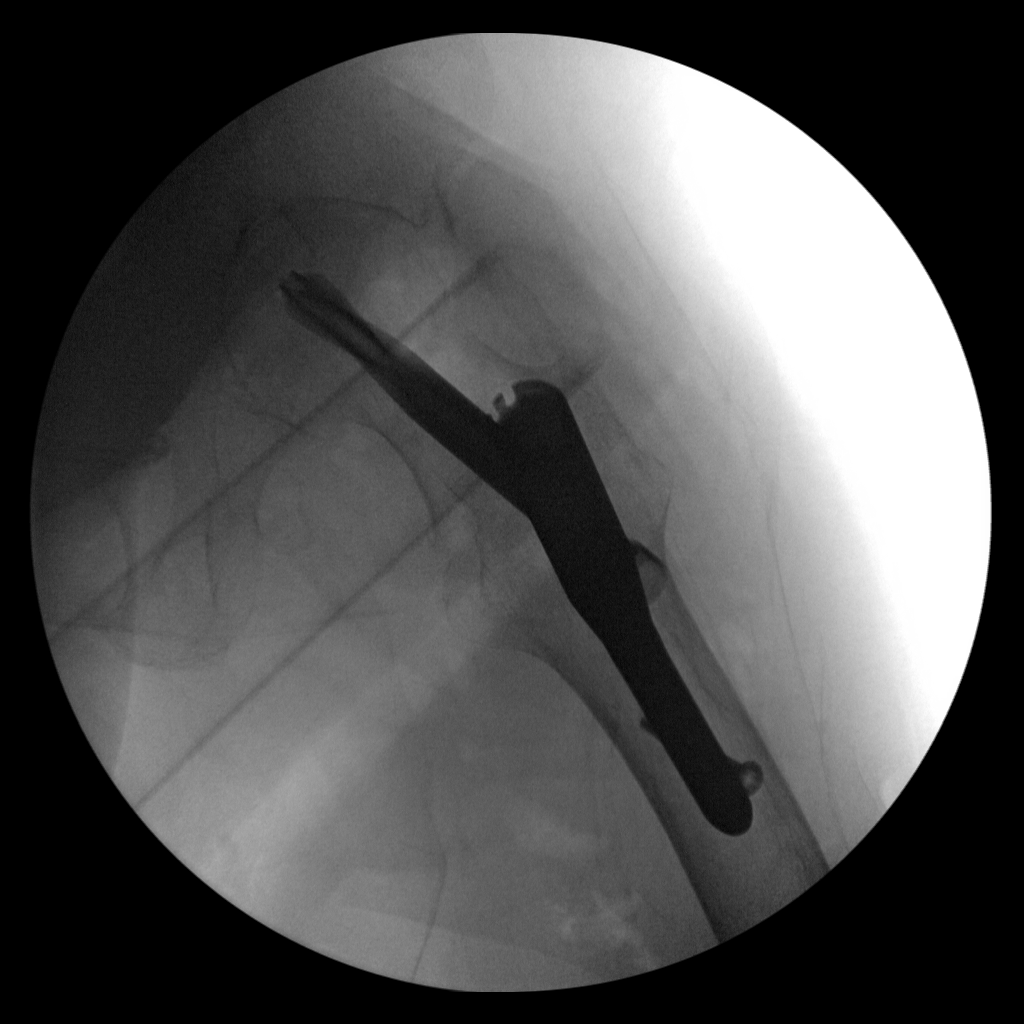

[2 of 2 positions shown; findings below may reference images not displayed]

FINDINGS: Insert medullary nail fixation of LEFT intertrochanteric fracture.
Compression screw and distal fixation screw noted.
IMPRESSION: No complication following intra medullary nail fixation of LEFT
femur fracture

## 2017-11-08 IMAGING — CT CT HEAD W/O CM
2 series · 16 of 30 positions shown, 19 images · non-contrast
Comparison: CT of the head performed 05/18/2015

CLINICAL DATA: Status post fall while going from bedroom to
bathroom. Concern for head injury. Initial encounter.

EXAM:
CT HEAD WITHOUT CONTRAST
TECHNIQUE: Contiguous axial images were obtained from the base of the skull
through the vertex without intravenous contrast.

[Series 2: head w/o · axial · non-contrast · 0.45mm/px · z∈[-80,+45]mm · 9 of 33 slices shown, 12 images]
[im 4/33  brain]
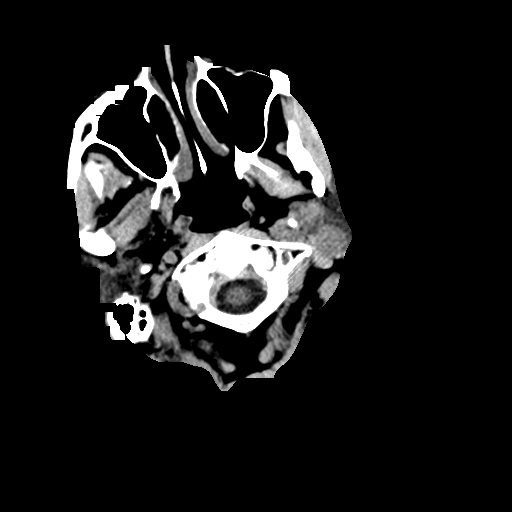
[im 4/33  bone]
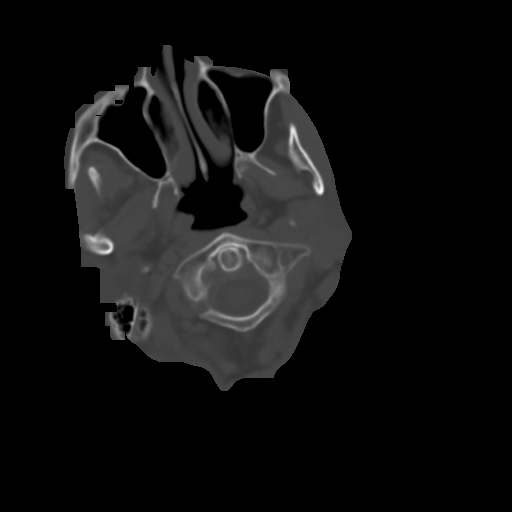
[im 7/33  brain]
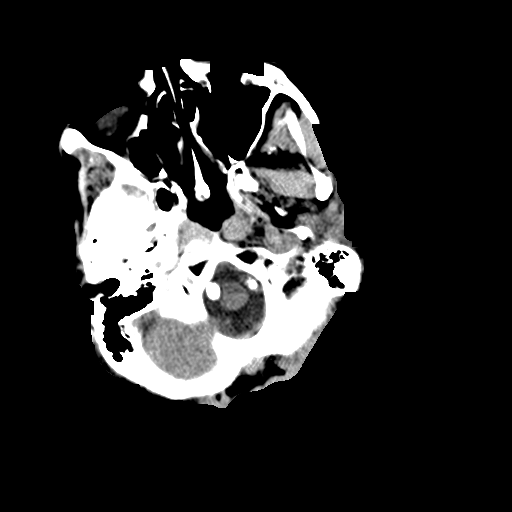
[im 10/33  brain]
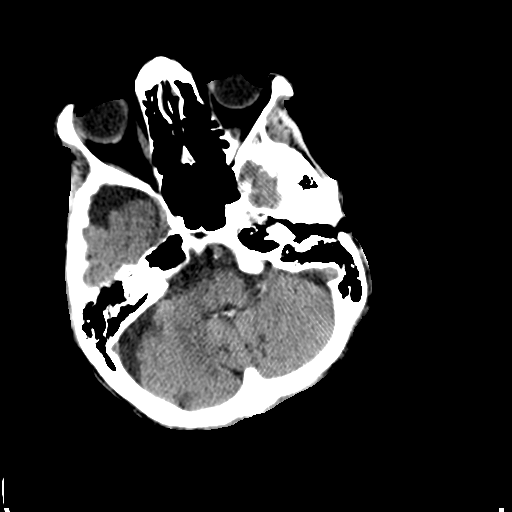
[im 13/33  brain]
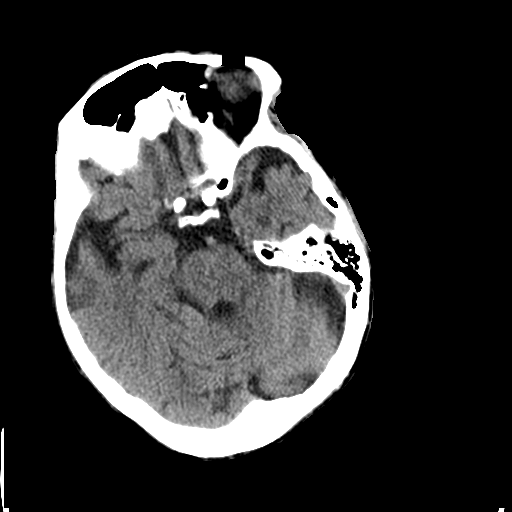
[im 17/33  brain]
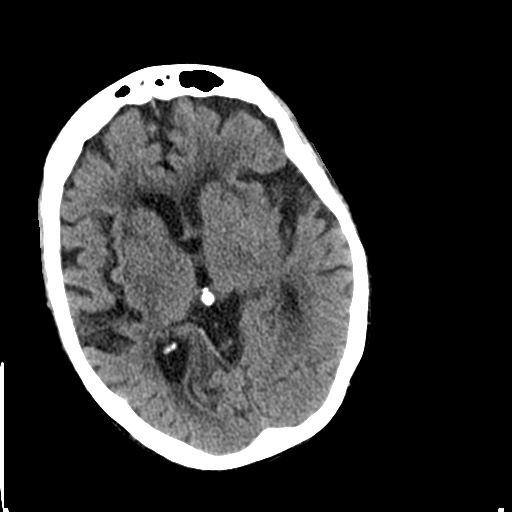
[im 17/33  bone]
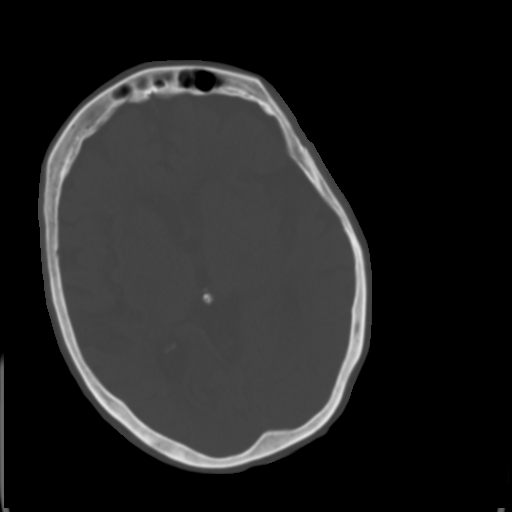
[im 20/33  brain]
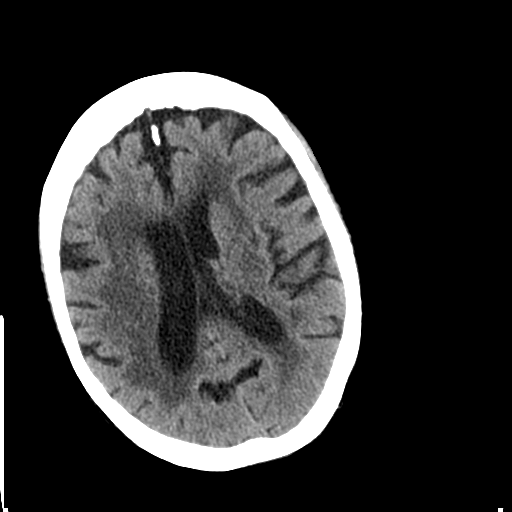
[im 23/33  brain]
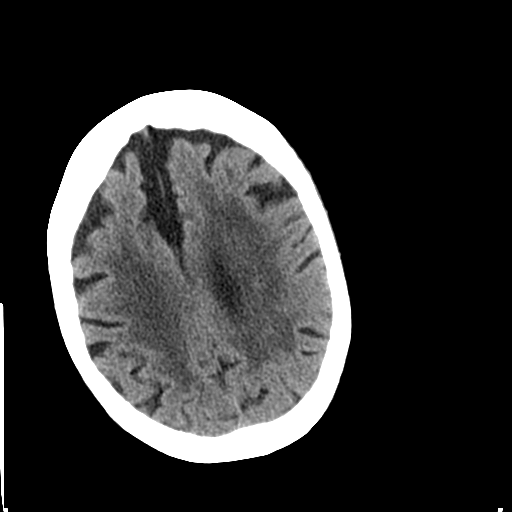
[im 26/33  brain]
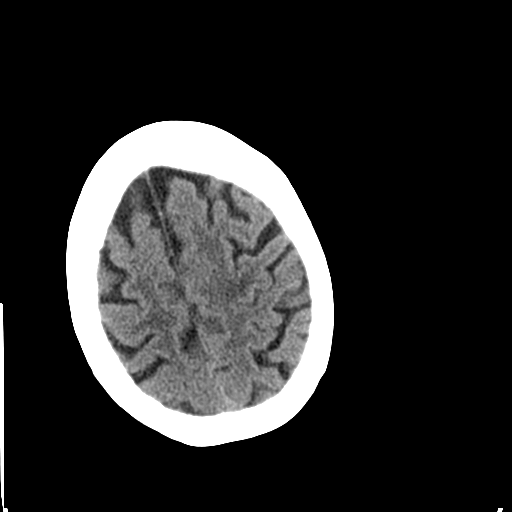
[im 29/33  brain]
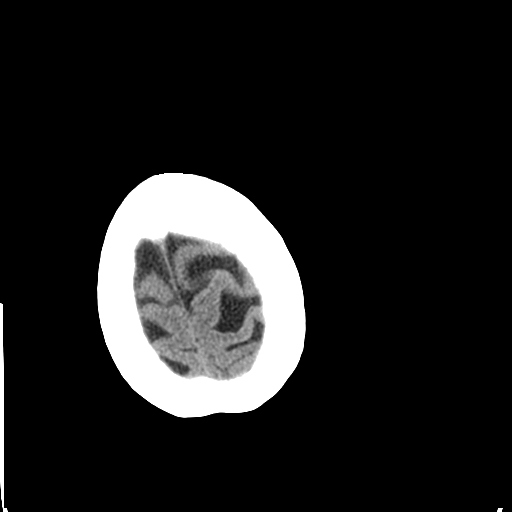
[im 29/33  bone]
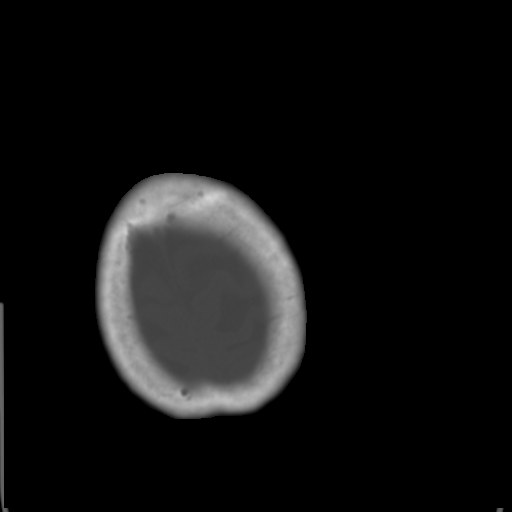

[Series 3: bone windows · axial · 0.45mm/px · z∈[-77,+31]mm · 7 of 55 slices shown]
[im 7/55  bone]
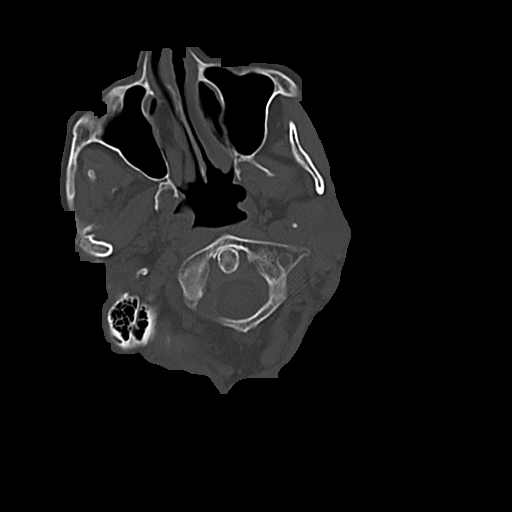
[im 13/55  bone]
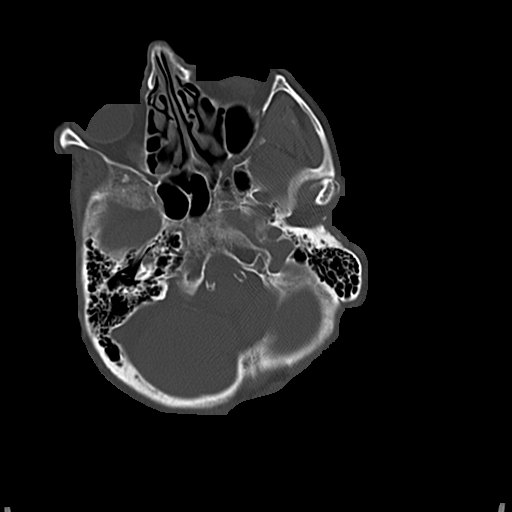
[im 19/55  bone]
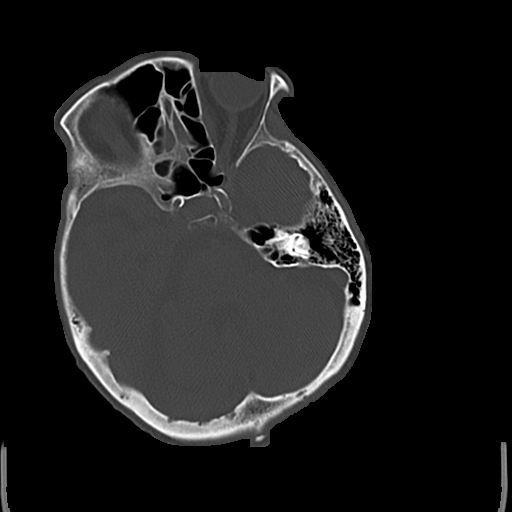
[im 25/55  bone]
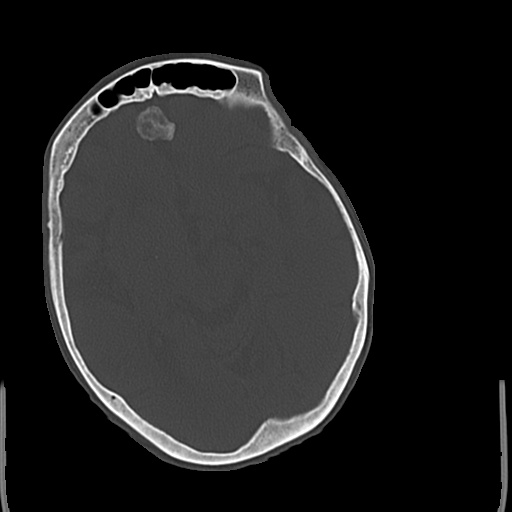
[im 31/55  bone]
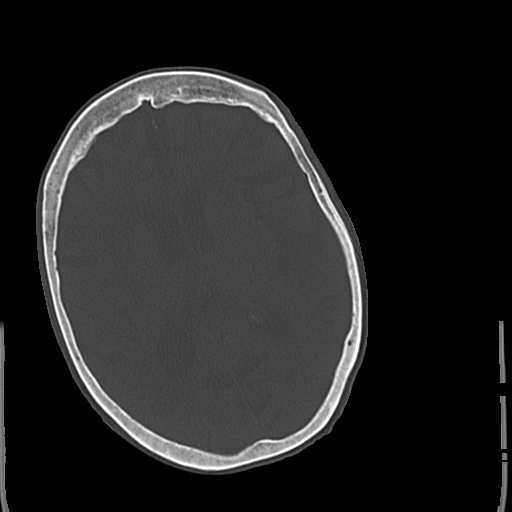
[im 37/55  bone]
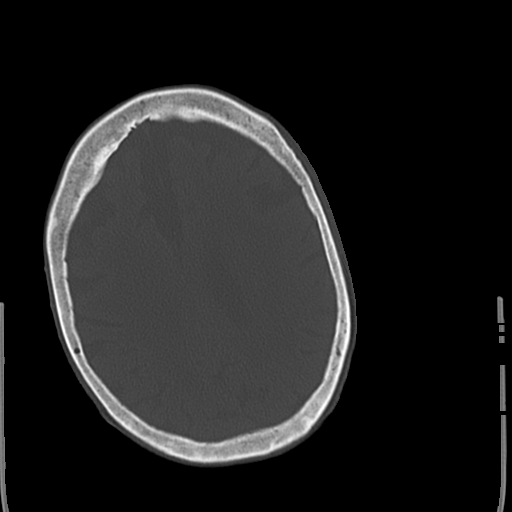
[im 43/55  bone]
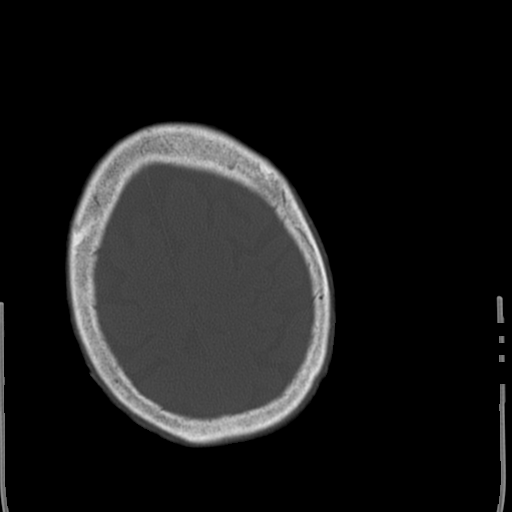

[16 of 30 positions shown; findings below may reference images not displayed]

FINDINGS: There is no evidence of acute infarction, or intra- or extra-axial
hemorrhage on CT.

Moderate cortical volume loss is noted, with prominence of the
ventricles and sulci. Cerebellar atrophy is seen. Relatively diffuse
periventricular and subcortical white matter change likely reflects
small vessel ischemic microangiopathy. Chronic lacunar infarcts are
seen at the basal ganglia bilaterally. A chronic lacunar infarct is
also noted at the left side of the pons. A 2.0 cm densely calcified
meningioma is again noted along the inferior aspect of the anterior
falx cerebri, grossly stable in appearance.

The brainstem and fourth ventricle are within normal limits. The
basal ganglia are unremarkable in appearance. The cerebral
hemispheres demonstrate grossly normal gray-white differentiation.
No mass effect or midline shift is seen.

There is no evidence of fracture; visualized osseous structures are
unremarkable in appearance. The orbits are within normal limits. The
paranasal sinuses and mastoid air cells are well-aerated. No
significant soft tissue abnormalities are seen.
IMPRESSION: 1. No acute intracranial pathology seen on CT.
2. Moderate cortical volume loss and diffuse small vessel ischemic
microangiopathy.
3. Chronic lacunar infarcts at the basal ganglia bilaterally, and
chronic lacunar infarct at the left side of the pons.
4. 2.0 cm densely calcified meningioma again noted along the
inferior aspect of the anterior falx cerebri.

## 2018-09-26 DEATH — deceased

## 2019-05-24 ENCOUNTER — Encounter: Payer: Self-pay | Admitting: Cardiology
